# Patient Record
Sex: Female | Born: 1960 | Race: White | Hispanic: No | Marital: Married | Smoking: Former smoker
Health system: Southern US, Community
[De-identification: ages and names within clinical notes are randomized; demographics above are authoritative.]

## PROBLEM LIST (undated history)

## (undated) DIAGNOSIS — Z789 Other specified health status: Secondary | ICD-10-CM

## (undated) DIAGNOSIS — D649 Anemia, unspecified: Secondary | ICD-10-CM

## (undated) DIAGNOSIS — R0989 Other specified symptoms and signs involving the circulatory and respiratory systems: Secondary | ICD-10-CM

## (undated) DIAGNOSIS — Z0282 Encounter for adoption services: Secondary | ICD-10-CM

## (undated) DIAGNOSIS — C801 Malignant (primary) neoplasm, unspecified: Secondary | ICD-10-CM

## (undated) DIAGNOSIS — I6523 Occlusion and stenosis of bilateral carotid arteries: Secondary | ICD-10-CM

## (undated) DIAGNOSIS — E039 Hypothyroidism, unspecified: Secondary | ICD-10-CM

## (undated) DIAGNOSIS — G43909 Migraine, unspecified, not intractable, without status migrainosus: Secondary | ICD-10-CM

## (undated) DIAGNOSIS — N1330 Unspecified hydronephrosis: Secondary | ICD-10-CM

## (undated) DIAGNOSIS — M1990 Unspecified osteoarthritis, unspecified site: Secondary | ICD-10-CM

## (undated) DIAGNOSIS — N390 Urinary tract infection, site not specified: Secondary | ICD-10-CM

## (undated) DIAGNOSIS — D494 Neoplasm of unspecified behavior of bladder: Secondary | ICD-10-CM

## (undated) DIAGNOSIS — E538 Deficiency of other specified B group vitamins: Secondary | ICD-10-CM

## (undated) DIAGNOSIS — K824 Cholesterolosis of gallbladder: Secondary | ICD-10-CM

## (undated) DIAGNOSIS — F419 Anxiety disorder, unspecified: Secondary | ICD-10-CM

## (undated) DIAGNOSIS — Z720 Tobacco use: Secondary | ICD-10-CM

## (undated) DIAGNOSIS — R011 Cardiac murmur, unspecified: Secondary | ICD-10-CM

## (undated) DIAGNOSIS — F32A Depression, unspecified: Secondary | ICD-10-CM

## (undated) DIAGNOSIS — K219 Gastro-esophageal reflux disease without esophagitis: Secondary | ICD-10-CM

## (undated) DIAGNOSIS — J309 Allergic rhinitis, unspecified: Secondary | ICD-10-CM

## (undated) HISTORY — DX: Anemia, unspecified: D64.9

## (undated) HISTORY — PX: NASAL SEPTUM SURGERY: SHX37

## (undated) HISTORY — PX: FOOT SURGERY: SHX648

## (undated) HISTORY — DX: Allergic rhinitis, unspecified: J30.9

## (undated) HISTORY — DX: Anxiety disorder, unspecified: F41.9

## (undated) HISTORY — DX: Migraine, unspecified, not intractable, without status migrainosus: G43.909

## (undated) HISTORY — PX: BLADDER SURGERY: SHX569

## (undated) HISTORY — PX: ABDOMINAL HYSTERECTOMY: SHX81

## (undated) HISTORY — DX: Cholesterolosis of gallbladder: K82.4

## (undated) HISTORY — PX: CHOLECYSTECTOMY: SHX55

## (undated) HISTORY — PX: TONSILLECTOMY: SUR1361

## (undated) MED FILL — Fosaprepitant Dimeglumine For IV Infusion 150 MG (Base Eq): INTRAVENOUS | Qty: 5 | Status: AC

---

## 2005-05-16 ENCOUNTER — Ambulatory Visit: Payer: Self-pay | Admitting: Internal Medicine

## 2005-09-24 ENCOUNTER — Ambulatory Visit: Payer: Self-pay | Admitting: Internal Medicine

## 2006-06-19 ENCOUNTER — Ambulatory Visit: Payer: Self-pay | Admitting: Internal Medicine

## 2006-10-24 ENCOUNTER — Ambulatory Visit: Payer: Self-pay | Admitting: Unknown Physician Specialty

## 2007-07-22 ENCOUNTER — Ambulatory Visit: Payer: Self-pay | Admitting: Internal Medicine

## 2008-08-03 ENCOUNTER — Ambulatory Visit: Payer: Self-pay | Admitting: Internal Medicine

## 2009-08-04 ENCOUNTER — Ambulatory Visit: Payer: Self-pay | Admitting: Internal Medicine

## 2010-09-05 ENCOUNTER — Ambulatory Visit: Payer: Self-pay | Admitting: Internal Medicine

## 2011-09-18 ENCOUNTER — Ambulatory Visit: Payer: Self-pay | Admitting: Internal Medicine

## 2012-03-03 ENCOUNTER — Ambulatory Visit: Payer: Self-pay | Admitting: Otolaryngology

## 2012-03-03 HISTORY — PX: OTHER SURGICAL HISTORY: SHX169

## 2012-03-03 LAB — HEMOGLOBIN: HGB: 13.2 g/dL (ref 12.0–16.0)

## 2012-03-12 LAB — PATHOLOGY REPORT

## 2012-09-23 ENCOUNTER — Ambulatory Visit: Payer: Self-pay | Admitting: Internal Medicine

## 2012-10-22 HISTORY — PX: BREAST BIOPSY: SHX20

## 2012-12-05 ENCOUNTER — Observation Stay: Payer: Self-pay | Admitting: Surgery

## 2012-12-05 HISTORY — PX: LAPAROSCOPIC APPENDECTOMY: SHX408

## 2012-12-05 LAB — COMPREHENSIVE METABOLIC PANEL
Alkaline Phosphatase: 49 U/L — ABNORMAL LOW (ref 50–136)
Anion Gap: 7 (ref 7–16)
BUN: 21 mg/dL — ABNORMAL HIGH (ref 7–18)
Bilirubin,Total: 0.4 mg/dL (ref 0.2–1.0)
Calcium, Total: 8.5 mg/dL (ref 8.5–10.1)
Chloride: 111 mmol/L — ABNORMAL HIGH (ref 98–107)
Co2: 22 mmol/L (ref 21–32)
Creatinine: 0.87 mg/dL (ref 0.60–1.30)
EGFR (African American): >60
Osmolality: 281 (ref 275–301)
Potassium: 4 mmol/L (ref 3.5–5.1)
Sodium: 140 mmol/L (ref 136–145)
Total Protein: 7.6 g/dL (ref 6.4–8.2)

## 2012-12-05 LAB — CBC
MCH: 33.2 pg (ref 26.0–34.0)
MCHC: 34.4 g/dL (ref 32.0–36.0)
MCV: 97 fL (ref 80–100)
Platelet: 225 10*3/uL (ref 150–440)
RBC: 4.12 10*6/uL (ref 3.80–5.20)
WBC: 8.7 10*3/uL (ref 3.6–11.0)

## 2012-12-05 LAB — URINALYSIS, COMPLETE
Bilirubin,UR: NEGATIVE
Glucose,UR: NEGATIVE mg/dL (ref 0–75)
Ph: 6 (ref 4.5–8.0)
Protein: NEGATIVE
RBC,UR: 2 /HPF (ref 0–5)
Specific Gravity: 1.025 (ref 1.003–1.030)
WBC UR: 5 /HPF (ref 0–5)

## 2012-12-07 LAB — BASIC METABOLIC PANEL
Co2: 25 mmol/L (ref 21–32)
Creatinine: 0.89 mg/dL (ref 0.60–1.30)
EGFR (African American): >60
EGFR (Non-African Amer.): >60
Potassium: 3.3 mmol/L — ABNORMAL LOW (ref 3.5–5.1)

## 2012-12-07 LAB — CBC WITH DIFFERENTIAL/PLATELET
Basophil %: 0.7 %
Eosinophil #: 0.2 10*3/uL (ref 0.0–0.7)
HGB: 12.2 g/dL (ref 12.0–16.0)
MCH: 32.9 pg (ref 26.0–34.0)
MCV: 98 fL (ref 80–100)
Monocyte %: 7.9 %
Platelet: 217 10*3/uL (ref 150–440)
RBC: 3.72 10*6/uL — ABNORMAL LOW (ref 3.80–5.20)

## 2012-12-09 LAB — PATHOLOGY REPORT

## 2013-04-08 ENCOUNTER — Ambulatory Visit: Payer: Self-pay | Admitting: Physician Assistant

## 2013-09-25 ENCOUNTER — Ambulatory Visit: Payer: Self-pay | Admitting: Gastroenterology

## 2013-09-29 ENCOUNTER — Ambulatory Visit: Payer: Self-pay | Admitting: Internal Medicine

## 2013-10-09 ENCOUNTER — Ambulatory Visit: Payer: Self-pay | Admitting: Internal Medicine

## 2013-10-19 ENCOUNTER — Ambulatory Visit: Payer: Self-pay | Admitting: Internal Medicine

## 2013-10-20 LAB — PATHOLOGY REPORT

## 2015-02-11 NOTE — Op Note (Signed)
PATIENT NAME:  Barbara [name], Barbara [name] MR#:  [id number] DATE OF BIRTH:  10-Feb-1961  DATE OF PROCEDURE:  12/05/2012  PREOPERATIVE DIAGNOSIS: Acute appendicitis.   POSTOPERATIVE DIAGNOSIS: Acute appendicitis.   PROCEDURE: Laparoscopic appendectomy.   SURGEON: Kinslei Labine E. Excell Seltzerooper, MD.   ANESTHESIA: General with endotracheal tube.   INDICATIONS: This is a patient with right lower quadrant pain which is progressive. She has a normal white blood cell count, but has physical findings and CT findings suggestive of appendicitis and requires laparoscopy.   Preoperatively, we discussed the rationale for surgery, the options of observation, the risks of bleeding, infection, negative laparoscopy and an open procedure. This was reviewed for her. She understood and agreed to proceed.   FINDINGS: Acute appendicitis, nonruptured.   DESCRIPTION OF PROCEDURE: The patient was induced with general anesthesia, given IV antibiotics, VTE prophylaxis was in place. She was prepped and draped in a sterile fashion. Marcaine was infiltrated in skin and subcutaneous tissues around the periumbilical area. An incision was made. The Veress needle was placed. Pneumoperitoneum was obtained. A 5-mm trocar port was placed. The abdominal cavity was explored, and under direct vision, a 12-mm left lateral port was placed followed by a 5-mm suprapubic port, and the appendix was identified in the right lower quadrant, elevated. The base of the appendix appeared inflamed. The base of the appendix was divided utilizing an EndoGIA standard type load, and then the mesoappendix was divided utilizing 2 firings of the vascular load EndoGIA, and the specimen was passed out through the lateral port site with the aid of an EndoCatch bag. The area was irrigated with copious amounts of normal saline. Hemostasis was adequate. There was no sign of bowel injury or bleeding. The left lateral port site was closed with multiple simple sutures utilizing an EndoClose  technique of 0 Vicryl under direct vision, and then pneumoperitoneum was released. All ports were removed, 4-0 subcuticular moderate Monocryl was used on all skin edges. Steri-Strips, Mastisol and sterile dressings were placed.   The patient tolerated the procedure well. There were no complications. She was taken to the Recovery Room in stable condition to be admitted for continued care.   ____________________________ Adah Salvageichard E. Excell Seltzerooper, MD rec:jm D: 12/05/2012 21:47:58 ET T: 12/06/2012 10:56:22 ET JOB#: [id number]  cc: Adah Salvageichard E. Excell Seltzerooper, MD, <Dictator> Lattie HawICHARD E Arisha Gervais MD ELECTRONICALLY SIGNED 12/07/2012 19:37

## 2015-02-11 NOTE — H&P (Signed)
Subjective/Chief Complaint rlq pain 18 hrs rlq pain, nausea no emesis, nml BM this am. no prior episode n o f/c   History of Present Illness see above no back pain   Past History PMH depression, migraines PSH septum, thyroglossal duct cyst. foot fx "partial hysterectomy"   Past Med/Surgical Hx:  Arthritis:   Anemia:   Migraines:   Pubovaginal Sling/Cystocele A/P repair:   Right Foot Surgery:   Tonsillectomy:   hysterectomy:   deviated septum:   ALLERGIES:  NKA: None  Family and Social History:  Family History Non-Contributory   Social History positive  tobacco, negative ETOH, office   + Tobacco Current (within 1 year)   Place of Living Home   Review of Systems:  Fever/Chills No   Cough No   Abdominal Pain Yes   Diarrhea No   Constipation No   Nausea/Vomiting Yes   SOB/DOE No   Chest Pain No   Dysuria No   Tolerating Diet No  Nauseated   Medications/Allergies Reviewed Medications/Allergies reviewed   Physical Exam:  GEN uncomfortable   HEENT pink conjunctivae   NECK supple   RESP normal resp effort  clear BS  no use of accessory muscles   CARD regular rate   ABD positive tenderness  denies Flank Tenderness  soft  rlq max with pos Rosving's sign   LYMPH negative neck   EXTR negative edema   SKIN normal to palpation   PSYCH alert, A+O to time, place, person, good insight   Lab Results: Hepatic:  14-Feb-14 14:48   Bilirubin, Total 0.4  Alkaline Phosphatase  49  SGPT (ALT) 18  SGOT (AST) 18  Total Protein, Serum 7.6  Albumin, Serum 4.1  Routine Chem:  14-Feb-14 14:48   Glucose, Serum 74  BUN  21  Creatinine (comp) 0.87  Sodium, Serum 140  Potassium, Serum 4.0  Chloride, Serum  111  CO2, Serum 22  Calcium (Total), Serum 8.5  Osmolality (calc) 281  eGFR (African American) >60  eGFR (Non-African American) >60 (eGFR values <67m/min/1.73 m2 may be an indication of chronic kidney disease (CKD). Calculated eGFR is useful in  patients with stable renal function. The eGFR calculation will not be reliable in acutely ill patients when serum creatinine is changing rapidly. It is not useful in  patients on dialysis. The eGFR calculation may not be applicable to patients at the low and high extremes of body sizes, pregnant women, and vegetarians.)  Anion Gap 7  Routine UA:  14-Feb-14 14:48   Color (UA) Yellow  Clarity (UA) Clear  Glucose (UA) Negative  Bilirubin (UA) Negative  Ketones (UA) 1+  Specific Gravity (UA) 1.025  Blood (UA) 2+  pH (UA) 6.0  Protein (UA) Negative  Nitrite (UA) Negative  Leukocyte Esterase (UA) Negative (Result(s) reported on 05 Dec 2012 at 04:04PM.)  RBC (UA) 2 /HPF  WBC (UA) 5 /HPF  Bacteria (UA) TRACE  Epithelial Cells (UA) 5 /HPF  Mucous (UA) PRESENT (Result(s) reported on 05 Dec 2012 at 04:04PM.)  Routine Hem:  14-Feb-14 14:48   WBC (CBC) 8.7  RBC (CBC) 4.12  Hemoglobin (CBC) 13.7  Hematocrit (CBC) 39.8  Platelet Count (CBC) 225 (Result(s) reported on 05 Dec 2012 at 03:14PM.)  MCV 97  MCH 33.2  MCHC 34.4  RDW 13.0    Assessment/Admission Diagnosis rlq pain, ac appendicitits risks options in detail  see dictation   Electronic Signatures: CFlorene Glen(MD)  (Signed 14-Feb-14 20:08)  Authored: CHIEF COMPLAINT and HISTORY, PAST  MEDICAL/SURGIAL HISTORY, ALLERGIES, FAMILY AND SOCIAL HISTORY, REVIEW OF SYSTEMS, PHYSICAL EXAM, LABS, ASSESSMENT AND PLAN   Last Updated: 14-Feb-14 20:08 by Florene Glen (MD)

## 2015-02-11 NOTE — H&P (Signed)
PATIENT NAME:  Barbara [name], Leta L MR#:  [id number] DATE OF BIRTH:  09/08/1961  DATE OF ADMISSION:  12/05/2012  CHIEF COMPLAINT:  Right lower quadrant pain.   HISTORY OF PRESENT ILLNESS:  This is a patient with 12 to 18 hours of abdominal pain, started in the right lower quadrant.  She has never had an episode like this before.  She has had nausea, but no emesis.  Denies back pain.  No fevers or chills.  Had a normal bowel movement this morning, none since.  No diarrhea.  No melena or hematochezia.  No dysuria.   PAST MEDICAL HISTORY:  Depression and migraines.  PAST SURGICAL HISTORY:  Tonsillectomy, adenoidectomy, thyroglossal duct cyst excision, deviated septum, partial hysterectomy.  She believes her ovaries are in place.  She had a foot fracture requiring pins.   ALLERGIES:  None.   MEDICATIONS:  Multiple.  See chart.   FAMILY HISTORY:  Noncontributory.   SOCIAL HISTORY:  The patient smokes tobacco.  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   REVIEW OF SYSTEMS:   A 10 system review is performed and negative with the exception of that mentioned in the history of present illness.   PHYSICAL EXAMINATION: GENERAL:  Healthy, uncomfortable-appearing Caucasian female patient.  VITAL SIGNS:  Temp of 97.9, pulse of 78, respirations 20, blood pressure 120/58, pain scale of 7, 99% room air sat.  HEENT:  Shows no scleral icterus.  No palpable neck nodes.  CHEST:  Clear to auscultation.  CARDIAC:  Regular rate and rhythm.  ABDOMEN:  Soft.  Tender at McBurney's point maximally.  There is a positive Rovsing's sign.  Minimal percussion tenderness.  Minimal guarding in the right lower quadrant with no CVA tenderness.  EXTREMITIES:  Without edema.  NEUROLOGIC:  Grossly intact.  INTEGUMENT:  Shows no jaundice.   LABORATORY, DIAGNOSTIC AND RADIOLOGICAL DATA:  Laboratory values demonstrate a normal white blood cell count at 8.7.  H and H of 13.7 and 40.  Electrolytes are within normal limits  with the exception of chloride of 111.    CT scan is personally reviewed, suggestive of appendicitis.   ASSESSMENT AND PLAN:  This is a patient with likely appendicitis.  Her white blood cell count is normal, but she is maximally tender at McBurney's point with a positive Rovsing's sign and CT findings suggestive of appendicitis.  The rationale for offering laparoscopy has been discussed.  The risks of bleeding, infection, negative laparoscopy, conversion to an open procedure were reviewed with her.  She understood and agreed to proceed.    ____________________________ Adah Salvageichard E. Excell Seltzerooper, MD rec:ea D: 12/05/2012 20:11:00 ET T: 12/06/2012 02:00:00 ET JOB#: [id number]  cc: Adah Salvageichard E. Excell Seltzerooper, MD, <Dictator> Lattie HawICHARD E Jalayia Bagheri MD ELECTRONICALLY SIGNED 12/06/2012 6:37

## 2015-02-11 NOTE — Discharge Summary (Signed)
PATIENT NAME:  Barbara [name], Barbara [name] MR#:  [id number] DATE OF BIRTH:  01-Sep-1961  DATE OF ADMISSION:  12/05/2012 DATE OF DISCHARGE:  12/07/2012  DIAGNOSES:  1.  Depression. 2.  Acute appendicitis.   PROCEDURE: Laparoscopic appendectomy.   HISTORY OF PRESENT ILLNESS AND HOSPITAL COURSE:  This is a patient who has presented to the Emergency Room with right lower quadrant abdominal pain and nausea. Work-up showed normal white blood cell count but abnormal CT scan and physical findings suggestive of appendicitis.   She was taken to the operating room where laparoscopic appendectomy was performed for confirmed acute appendicitis. She made an uncomplicated postoperative recovery, did have some nausea and pain control issues and this precluded her from going home in the first 23 hours.  She stayed an extra evening and then went home the next day on oral analgesics with instructions to restart all of her home medications and showering and dressing instructions were given. She will follow up in my office in 10 days.    ____________________________ Adah Salvageichard E. Excell Seltzerooper, MD rec:ct D: 12/07/2012 19:48:34 ET T: 12/08/2012 12:10:20 ET JOB#: [id number]  cc: Adah Salvageichard E. Excell Seltzerooper, MD, <Dictator> Lattie HawICHARD E COOPER MD ELECTRONICALLY SIGNED 12/15/2012 9:42

## 2015-02-13 NOTE — Op Note (Signed)
PATIENT NAME:  Barbara [name], Barbara [name] MR#:  [id number] DATE OF BIRTH:  Mar 14, 1961  DATE OF PROCEDURE:  03/03/2012  PREOPERATIVE DIAGNOSIS: Thyroglossal duct cyst.   POSTOPERATIVE DIAGNOSIS: Thyroglossal duct cyst.  PROCEDURES: Excision of thyroglossal duct cyst with Sistrunk procedure and removal of midline hyoid bone.   SURGEON: Vernie MurdersPaul Mateen Franssen, MD  ASSISTANT: Karlyne GreenspanWilliam Vaught, MD  ANESTHESIA: General.   COMPLICATIONS: None.   TOTAL ESTIMATED BLOOD LOSS: Minimal.   DESCRIPTION OF PROCEDURE: The patient was given general anesthesia by oral endotracheal intubation. She was placed in the spine position with a shoulder roll and neck extended slightly. A large midline lump could be seen overlying the thyroid notch. A skin crease was marked in the midline and then 1% Xylocaine with epinephrine 1:100,000 was used for infiltration of the skin edges; 4 mL total were used. She was prepped and draped in sterile fashion.   An incision was made through the skin and sub-Q. There was no significant platysma in the midline. Dural hooks were used to help open this up. The strap muscles were overlying this and these were divided in the midline. They were separated from the top of this on both sides. The cyst was relatively long and came down over the thyroid notch. There was no significant pyramidal lobe that I could see and inferiorly the attachments were freed up. The muscle was freed up laterally on both sides and then the underlying soft tissue were freed up from the thyroid cartilage first and then the thyrohyoid membrane. The gland was attached to the hyoid bone. It was freed up on both sides all the way up to the bone and then using electrocautery the muscle attachments, at the superior edge of the hyoid bone, were freed up. Bone was skeletonized on both sides as well and then rongeurs were used to cut the bone on each side. The remaining soft tissue attachments to the preepiglottic space were freed up. The soft  tissue at the base of the tongue was searched for a tract based, at the base of the tongue. No further tracks or connections were noted. The area was separated free and the specimen was sent for permanent section. The wound was evaluated and a couple of small areas in the preepiglottic space were cauterized. No further bleeding was noted. Total blood loss was minimal. The wound was irrigated copiously and again no further bleeding. A 7 mm TLS drain was placed through an inferior stab incision and placed to low continuous bulb suction. The strap muscles were repaired first. The deep tissue was then sutured together loosely as there was no platysmal layer here. The dermis was then closed with interrupted 4-0 Vicryls and then the skin edges were held in apposition with a 6-0 running nylon locking suture. The wound was covered with bacitracin followed by Telfa and a Tegaderm. The drain was placed to low continuous Vacutainer suction. The patient tolerated the procedure well. She was awakened and taken to the recovery room in satisfactory condition. There were no operative complications.  ____________________________ Cammy CopaPaul H. Aasir Daigler, MD phj:slb D: 03/03/2012 08:52:51 ET T: 03/03/2012 13:01:44 ET JOB#: [id number]  cc: Cammy CopaPaul H. Demareon Coldwell, MD, <Dictator> Cammy CopaPAUL H Joseline Mccampbell MD ELECTRONICALLY SIGNED 03/03/2012 18:27

## 2015-09-19 ENCOUNTER — Other Ambulatory Visit: Payer: Self-pay | Admitting: Internal Medicine

## 2015-09-19 DIAGNOSIS — Z1231 Encounter for screening mammogram for malignant neoplasm of breast: Secondary | ICD-10-CM

## 2015-10-04 ENCOUNTER — Ambulatory Visit: Payer: Self-pay | Attending: Internal Medicine

## 2015-10-07 ENCOUNTER — Ambulatory Visit
Admission: RE | Admit: 2015-10-07 | Discharge: 2015-10-07 | Disposition: A | Discharge status: Home / Self Care | Payer: 59 | Source: Ambulatory Visit | Attending: Internal Medicine | Admitting: Internal Medicine

## 2015-10-07 DIAGNOSIS — Z1231 Encounter for screening mammogram for malignant neoplasm of breast: Secondary | ICD-10-CM | POA: Diagnosis not present

## 2015-10-18 ENCOUNTER — Other Ambulatory Visit: Payer: Self-pay | Admitting: Internal Medicine

## 2015-10-18 DIAGNOSIS — R928 Other abnormal and inconclusive findings on diagnostic imaging of breast: Secondary | ICD-10-CM

## 2015-10-27 ENCOUNTER — Ambulatory Visit
Admission: RE | Admit: 2015-10-27 | Discharge: 2015-10-27 | Disposition: A | Discharge status: Home / Self Care | Payer: 59 | Source: Ambulatory Visit | Attending: Internal Medicine | Admitting: Internal Medicine

## 2015-10-27 DIAGNOSIS — R928 Other abnormal and inconclusive findings on diagnostic imaging of breast: Secondary | ICD-10-CM | POA: Insufficient documentation

## 2016-09-20 ENCOUNTER — Other Ambulatory Visit: Payer: Self-pay | Admitting: Nurse Practitioner

## 2016-09-20 DIAGNOSIS — Z1231 Encounter for screening mammogram for malignant neoplasm of breast: Secondary | ICD-10-CM

## 2016-10-26 ENCOUNTER — Ambulatory Visit: Payer: 59

## 2016-10-29 ENCOUNTER — Ambulatory Visit
Admission: RE | Admit: 2016-10-29 | Discharge: 2016-10-29 | Disposition: A | Discharge status: Home / Self Care | Payer: 59 | Source: Ambulatory Visit | Attending: Nurse Practitioner | Admitting: Nurse Practitioner

## 2016-10-29 DIAGNOSIS — Z1231 Encounter for screening mammogram for malignant neoplasm of breast: Secondary | ICD-10-CM | POA: Insufficient documentation

## 2017-09-24 ENCOUNTER — Other Ambulatory Visit: Payer: Self-pay | Admitting: Nurse Practitioner

## 2017-09-24 DIAGNOSIS — Z1231 Encounter for screening mammogram for malignant neoplasm of breast: Secondary | ICD-10-CM

## 2017-10-31 ENCOUNTER — Ambulatory Visit
Admission: RE | Admit: 2017-10-31 | Discharge: 2017-10-31 | Disposition: A | Discharge status: Home / Self Care | Payer: Managed Care, Other (non HMO) | Source: Ambulatory Visit | Attending: Nurse Practitioner | Admitting: Nurse Practitioner

## 2017-10-31 DIAGNOSIS — Z1231 Encounter for screening mammogram for malignant neoplasm of breast: Secondary | ICD-10-CM | POA: Diagnosis not present

## 2018-09-11 ENCOUNTER — Other Ambulatory Visit: Payer: Self-pay

## 2018-09-11 MED ORDER — ESCITALOPRAM OXALATE 20 MG PO TABS: ORAL_TABLET | ORAL | 0 refills | Status: DC

## 2018-09-11 MED ORDER — ESTRADIOL 1 MG PO TABS: 1.0000 mg | ORAL_TABLET | Freq: Every day | ORAL | 0 refills | Status: DC

## 2018-09-11 MED ORDER — TOPIRAMATE 50 MG PO TABS: 50.0000 mg | ORAL_TABLET | Freq: Two times a day (BID) | ORAL | 0 refills | Status: DC | PRN

## 2018-09-11 MED ORDER — CELECOXIB 200 MG PO CAPS: 200.0000 mg | ORAL_CAPSULE | Freq: Every day | ORAL | 0 refills | Status: DC

## 2018-09-23 ENCOUNTER — Encounter: Payer: Self-pay | Admitting: Nurse Practitioner

## 2018-09-25 ENCOUNTER — Ambulatory Visit (INDEPENDENT_AMBULATORY_CARE_PROVIDER_SITE_OTHER): Payer: Managed Care, Other (non HMO) | Admitting: Nurse Practitioner

## 2018-09-25 ENCOUNTER — Ambulatory Visit
Admission: RE | Admit: 2018-09-25 | Discharge: 2018-09-25 | Disposition: A | Discharge status: Home / Self Care | Payer: Managed Care, Other (non HMO) | Source: Ambulatory Visit | Attending: Nurse Practitioner | Admitting: Nurse Practitioner

## 2018-09-25 ENCOUNTER — Encounter: Payer: Self-pay | Admitting: Nurse Practitioner

## 2018-09-25 VITALS — BP 116/68 | HR 75 | Resp 16 | Ht 65.0 in | Wt 124.6 lb

## 2018-09-25 DIAGNOSIS — G43509 Persistent migraine aura without cerebral infarction, not intractable, without status migrainosus: Secondary | ICD-10-CM | POA: Diagnosis not present

## 2018-09-25 DIAGNOSIS — F17210 Nicotine dependence, cigarettes, uncomplicated: Secondary | ICD-10-CM

## 2018-09-25 DIAGNOSIS — R059 Cough, unspecified: Secondary | ICD-10-CM | POA: Insufficient documentation

## 2018-09-25 DIAGNOSIS — Z124 Encounter for screening for malignant neoplasm of cervix: Secondary | ICD-10-CM

## 2018-09-25 DIAGNOSIS — M62838 Other muscle spasm: Secondary | ICD-10-CM | POA: Diagnosis not present

## 2018-09-25 DIAGNOSIS — R0989 Other specified symptoms and signs involving the circulatory and respiratory systems: Secondary | ICD-10-CM

## 2018-09-25 DIAGNOSIS — M150 Primary generalized (osteo)arthritis: Secondary | ICD-10-CM

## 2018-09-25 DIAGNOSIS — R5383 Other fatigue: Secondary | ICD-10-CM | POA: Insufficient documentation

## 2018-09-25 DIAGNOSIS — R05 Cough: Secondary | ICD-10-CM | POA: Insufficient documentation

## 2018-09-25 DIAGNOSIS — Z1239 Encounter for other screening for malignant neoplasm of breast: Secondary | ICD-10-CM

## 2018-09-25 DIAGNOSIS — Z0001 Encounter for general adult medical examination with abnormal findings: Secondary | ICD-10-CM | POA: Diagnosis not present

## 2018-09-25 DIAGNOSIS — E2839 Other primary ovarian failure: Secondary | ICD-10-CM

## 2018-09-25 DIAGNOSIS — F324 Major depressive disorder, single episode, in partial remission: Secondary | ICD-10-CM

## 2018-09-25 MED ORDER — CARISOPRODOL 350 MG PO TABS: 350.0000 mg | ORAL_TABLET | Freq: Every day | ORAL | 1 refills | Status: DC

## 2018-09-25 MED ORDER — CELECOXIB 200 MG PO CAPS: 200.0000 mg | ORAL_CAPSULE | Freq: Every day | ORAL | 3 refills | Status: DC

## 2018-09-25 MED ORDER — TOPIRAMATE 50 MG PO TABS: 50.0000 mg | ORAL_TABLET | Freq: Every day | ORAL | 3 refills | Status: DC

## 2018-09-25 MED ORDER — ESCITALOPRAM OXALATE 20 MG PO TABS: ORAL_TABLET | ORAL | 3 refills | Status: DC

## 2018-09-25 MED ORDER — ESTRADIOL 1 MG PO TABS: 1.0000 mg | ORAL_TABLET | Freq: Every day | ORAL | 3 refills | Status: DC

## 2018-09-25 NOTE — Addendum Note (Signed)
Addended by: Golda AcrePATEL, Nohelani Benning on: 09/25/2018 02:49 PM   Modules accepted: Orders

## 2018-09-25 NOTE — Progress Notes (Signed)
[facility] [address] [place], [place] [address]  Internal MEDICINE  Office Visit Note  Patient Name: Barbara [name]  [id number]  [id number]  Date of Service: 09/25/2018   Pt is here for routine health maintenance examination   Chief Complaint  Patient presents with  . Annual Exam  . Anxiety  . Migraine     The patient is here for routine health maintenance exam and pap smear today. She is due to have routine, fasting labs checked and will be due to screening mammogram in January, 2020. She is a smoker. States that she smokes about 1/2 pack of cigarettes per day. Her ex-husband recently passed away from lung cancer, and she would like to be screened. She does have a chronic, intermittent cough. She denies chest pain, pressure, or shortness of breath.    Current Medication: Outpatient Encounter Medications as of 09/25/2018  Medication Sig  . carisoprodol (SOMA) 350 MG tablet Take 1 tablet (350 mg total) by mouth at bedtime.  . celecoxib (CELEBREX) 200 MG capsule Take 1 capsule (200 mg total) by mouth daily.  Marland Kitchen escitalopram (LEXAPRO) 20 MG tablet Take 1/2 tab po once a day and increase to 1 tab daily as directed  . estradiol (ESTRACE) 1 MG tablet Take 1 tablet (1 mg total) by mouth daily.  Marland Kitchen topiramate (TOPAMAX) 50 MG tablet Take 1 tablet (50 mg total) by mouth daily.  . [DISCONTINUED] carisoprodol (SOMA) 350 MG tablet Take 350 mg by mouth at bedtime.  . [DISCONTINUED] celecoxib (CELEBREX) 200 MG capsule Take 1 capsule (200 mg total) by mouth daily.  . [DISCONTINUED] escitalopram (LEXAPRO) 20 MG tablet Take 1/2 tab po once a day and increase to 1 tab daily as directed  . [DISCONTINUED] estradiol (ESTRACE) 1 MG tablet Take 1 tablet (1 mg total) by mouth daily.  . [DISCONTINUED] topiramate (TOPAMAX) 50 MG tablet Take 1 tablet (50 mg total) by mouth 2 (two) times daily as needed.   No facility-administered encounter medications on file as of 09/25/2018.      Surgical History: Past Surgical History:  Procedure Laterality Date  . ABDOMINAL HYSTERECTOMY    . BREAST BIOPSY Left 2014   neg  . CHOLECYSTECTOMY    . cyst removed from neck    . FOOT SURGERY Right    2009  . NASAL SEPTUM SURGERY      Medical History: Past Medical History:  Diagnosis Date  . Allergic rhinitis   . Anemia   . Anxiety   . Migraines     Family History: Family History  Adopted: Yes      Review of Systems  Constitutional: Negative for activity change, chills, fatigue and unexpected weight change.  HENT: Negative for congestion, postnasal drip, rhinorrhea, sneezing and sore throat.   Respiratory: Positive for cough. Negative for chest tightness and shortness of breath.   Cardiovascular: Negative for chest pain and palpitations.  Gastrointestinal: Negative for abdominal pain, constipation, diarrhea, nausea and vomiting.  Endocrine: Negative for cold intolerance, heat intolerance, polydipsia and polyuria.  Genitourinary: Negative for dysuria and frequency.  Musculoskeletal: Positive for arthralgias and myalgias. Negative for back pain, joint swelling and neck pain.  Skin: Negative for rash.  Allergic/Immunologic: Negative for environmental allergies.  Neurological: Positive for headaches. Negative for tremors and numbness.  Hematological: Negative for adenopathy. Does not bruise/bleed easily.  Psychiatric/Behavioral: Negative for behavioral problems (Depression), sleep disturbance and suicidal ideas. The patient is not nervous/anxious.      Today's Vitals  09/25/18 1024  BP: 116/68  Pulse: 75  Resp: 16  SpO2: 96%  Weight: 124 lb 9.6 oz (56.5 kg)  Height: 5\' 5"  (1.651 m)    Physical Exam  Constitutional: She is oriented to person, place, and time. She appears well-developed and well-nourished. No distress.  HENT:  Head: Normocephalic and atraumatic.  Nose: Nose normal.  Mouth/Throat: Oropharynx is clear and moist. No oropharyngeal exudate.   Eyes: Pupils are equal, round, and reactive to light. Conjunctivae and EOM are normal.  Neck: Normal range of motion. Neck supple. No JVD present. Carotid bruit is present. No tracheal deviation present. No thyromegaly present.  Soft, bilateral carotid bruit.  Cardiovascular: Normal rate, regular rhythm, normal heart sounds and intact distal pulses. Exam reveals no gallop and no friction rub.  No murmur heard. Pulmonary/Chest: Effort normal and breath sounds normal. No respiratory distress. She has no wheezes. She has no rales. She exhibits no tenderness. Right breast exhibits no inverted nipple, no mass, no nipple discharge, no skin change and no tenderness. Left breast exhibits no inverted nipple, no mass, no nipple discharge, no skin change and no tenderness.  Abdominal: Soft. Bowel sounds are normal. There is no tenderness.  Genitourinary: Vagina normal and uterus normal.  Genitourinary Comments: No tenderness, masses, or organomeglay present during bimanual exam .  Musculoskeletal: Normal range of motion.  Lymphadenopathy:    She has no cervical adenopathy.  Neurological: She is alert and oriented to person, place, and time. No cranial nerve deficit.  Skin: Skin is warm and dry. Capillary refill takes less than 2 seconds. She is not diaphoretic.  Psychiatric: She has a normal mood and affect. Her behavior is normal. Judgment and thought content normal.  Nursing note and vitals reviewed.  Assessment/Plan: 1. Encounter for general adult medical examination with abnormal findings Annual health maintenance exam and pap smear today.  - UA/M w/rflx Culture, Routine  2. Persistent migraine aura without cerebral infarction and without status migrainosus, not intractable Generally well managed. Continue topamax 50mg  daily to prevent acute headaches . - topiramate (TOPAMAX) 50 MG tablet; Take 1 tablet (50 mg total) by mouth daily.  Dispense: 90 tablet; Refill: 3  3. Routine cervical smear -  Pap IG and HPV (high risk) DNA detection  4. Muscle spasms of neck May continue to take soma 350mg  at bedtime as needed for neck pain/muscle spasms  - carisoprodol (SOMA) 350 MG tablet; Take 1 tablet (350 mg total) by mouth at bedtime.  Dispense: 90 tablet; Refill: 1  5. Cough Long term history of smoking. Will get chest x-ray for further evaluation.  - DG Chest 2 View; Future  6. Cigarette nicotine dependence without complication Long term history of smoking. Will get chest x-ray for further evaluation.  Discussion regarding importance of smoking cessation. Patient plans to quit completely by the new year.  - DG Chest 2 View; Future  7. Bilateral carotid bruits - US Carotid Duplex Bilateral; Future  8. Primary generalized hypertrophic osteoarthrosis - celecoxib (CELEBREX) 200 MG capsule; Take 1 capsule (200 mg total) by mouth daily.  Dispense: 90 capsule; Refill: 3  9. Depression, major, single episode, in partial remission (HCC) - escitalopram (LEXAPRO) 20 MG tablet; Take 1/2 tab po once a day and increase to 1 tab daily as directed  Dispense: 90 tablet; Refill: 3  10. Estrogen deficiency - estradiol (ESTRACE) 1 MG tablet; Take 1 tablet (1 mg total) by mouth daily.  Dispense: 90 tablet; Refill: 3  11. Screening for breast  cancer - MM DIGITAL SCREENING BILATERAL; Future  General Counseling: Debi verbalizes understanding of the findings of todays visit and agrees with plan of treatment. I have discussed any further diagnostic evaluation that may be needed or ordered today. We also reviewed her medications today. she has been encouraged to call the office with any questions or concerns that should arise related to todays visit.    Counseling:  Smoking cessation counseling: 1. Pt acknowledges the risks of long term smoking, she will try to quite smoking. 2. Options for different medications including nicotine products, chewing gum, patch etc, Wellbutrin and Chantix is  discussed 3. Goal and date of compete cessation is discussed 4. Total time spent in smoking cessation is 15 min.  This patient was seen by Vincent GrosHeather Jameica Couts FNP Collaboration with Dr Lyndon CodeFozia M Khan as a part of collaborative care agreement   Orders Placed This Encounter  Procedures  . MM DIGITAL SCREENING BILATERAL  . DG Chest 2 View  . US Carotid Duplex Bilateral  . UA/M w/rflx Culture, Routine    Meds ordered this encounter  Medications  . topiramate (TOPAMAX) 50 MG tablet    Sig: Take 1 tablet (50 mg total) by mouth daily.    Dispense:  90 tablet    Refill:  3    Order Specific Question:   Supervising Provider    Answer:   Lyndon CodeKHAN, FOZIA M [[id number]]  . estradiol (ESTRACE) 1 MG tablet    Sig: Take 1 tablet (1 mg total) by mouth daily.    Dispense:  90 tablet    Refill:  3    Order Specific Question:   Supervising Provider    Answer:   Lyndon CodeKHAN, FOZIA M [[id number]]  . escitalopram (LEXAPRO) 20 MG tablet    Sig: Take 1/2 tab po once a day and increase to 1 tab daily as directed    Dispense:  90 tablet    Refill:  3    Order Specific Question:   Supervising Provider    Answer:   Lyndon CodeKHAN, FOZIA M [[id number]]  . celecoxib (CELEBREX) 200 MG capsule    Sig: Take 1 capsule (200 mg total) by mouth daily.    Dispense:  90 capsule    Refill:  3    Order Specific Question:   Supervising Provider    Answer:   Lyndon CodeKHAN, FOZIA M [[id number]]  . carisoprodol (SOMA) 350 MG tablet    Sig: Take 1 tablet (350 mg total) by mouth at bedtime.    Dispense:  90 tablet    Refill:  1    Order Specific Question:   Supervising Provider    Answer:   Lyndon CodeKHAN, FOZIA M [[id number]]    Time spent: 7630 Minutes      Lyndon CodeFozia M Khan, MD  Internal Medicine

## 2018-09-26 ENCOUNTER — Telehealth: Payer: Self-pay

## 2018-09-26 LAB — UA/M W/RFLX CULTURE, ROUTINE
Bilirubin, UA: NEGATIVE
Glucose, UA: NEGATIVE
Ketones, UA: NEGATIVE
LEUKOCYTES UA: NEGATIVE
Nitrite, UA: NEGATIVE
Protein, UA: NEGATIVE
RBC, UA: NEGATIVE
Specific Gravity, UA: 1.021 (ref 1.005–1.030)
Urobilinogen, Ur: 1 mg/dL (ref 0.2–1.0)
pH, UA: 5.5 (ref 5.0–7.5)

## 2018-09-26 LAB — MICROSCOPIC EXAMINATION
Casts: NONE SEEN /lpf
Epithelial Cells (non renal): >10 /hpf — AB (ref 0–10)

## 2018-09-26 NOTE — Telephone Encounter (Signed)
-----   Message from Carlean JewsHeather E Boscia, NP sent at 09/26/2018  8:07 AM EST ----- Please let the patient know that her chest x-ray is normal. Thanks.

## 2018-09-26 NOTE — Telephone Encounter (Signed)
Left message for pt to call back for chest xray results

## 2018-09-26 NOTE — Telephone Encounter (Signed)
Pt advised chest xray came back normal

## 2018-09-28 LAB — PAP IG AND HPV HIGH-RISK: HPV, HIGH-RISK: NEGATIVE

## 2018-10-17 ENCOUNTER — Ambulatory Visit: Payer: Managed Care, Other (non HMO)

## 2018-10-17 DIAGNOSIS — R0989 Other specified symptoms and signs involving the circulatory and respiratory systems: Secondary | ICD-10-CM | POA: Diagnosis not present

## 2018-10-23 ENCOUNTER — Telehealth: Payer: Self-pay | Admitting: Nurse Practitioner

## 2018-10-23 NOTE — Telephone Encounter (Signed)
I don't have any ultrasound results from 10/17/2018 yet. Can you ask Barbara [name] if she has them and she can put it on my desk or send me pic of it tomorrow.

## 2018-10-27 NOTE — Procedures (Signed)
[facility] [address] [place], [place] [address]  DATE OF SERVICE: 10/17/2018  CAROTID DOPPLER INTERPRETATION:  Bilateral Carotid Ultrsasound and Color Doppler Examination was performed. The RIGHT CCA shows mild plaque in the vessel. The LEFT CCA shows mild plaque in the vessel. There was nno intimal thickening noted in the RIGHT carotid artery. There was no intimal thickening in the LEFT carotid artery.  The RIGHT CCA shows peak systolic velocity of 84cm per second. The end diastolic velocity is 21cm per second on the RIGHT side. The RIGHT ICA shows peak systolic velocity of 75 per second. RIGHT sided ICA end diastolic velocity is 30cm per second. The RIGHT ECA shows a peak systolic velocity of 76cm per second. The ICA/CCA ratio is calculated to be 0.9. This suggests less than 50% stenosis. The Vertebral Artery shows antegrae flow.  The LEFT CCA shows peak systolic velocity of 69cm per second. The end diastolic velocity is 20cm per second on the LEFT side. The LEFT ICA shows peak systolic velocity of 105per second. LEFT sided ICA end diastolic velocity is 39cm per second. The LEFT ECA shows a peak systolic velocity of 78cm per second. The ICA/CCA ratio is calculated to be 1.5. This suggests less than 50% stenosis. The Vertebral Artery shows antegrade flow.   Impression:    The RIGHT CAROTID shows less than 50% stenosis. The LEFT CAROTID shows less than 50% stenosis.  There is mild plaque formation noted on the LEFT and mild on the RIGHT  side. Consider a repeat Carotid doppler if clinical situation and symptoms warrant in 6-12 months. Patient should be encouraged to change lifestyles such as smoking cessation, regular exercise and dietary modification. Use of statins in the right clinical setting and ASA is encouraged.  Yevonne Pax, MD [facility]

## 2018-10-28 ENCOUNTER — Telehealth: Payer: Self-pay

## 2018-10-28 NOTE — Telephone Encounter (Signed)
PT CALLED ASKING WHAT PERCENTAGE OF PLAQUE WAS SHOWN IN HER ULTRASOUND AND WHAT SHE CAN DO TO PREVENT OF RESOLVE IT.   SPOKE WITH HEATHER AND SHE SAID PT HAS MILD PLAQUE, LESS THAN 50%,  STENOSIS (HARDENING), OF CAROTID ARTERIES. WE WILL MONITOR THIS YEARLY. WE CAN MAIL HER A HEART HEALTH DIET AND SHE SHOULD PARTICIPATE IN LOW IMPACT EXERCISE ROUTINELY.   I CALLED PT BACK AND NOTIFIED HER OF THE ABOVE AND PUT THE DIET IN THE MAIL TO BE SENT TO HER.

## 2018-11-04 ENCOUNTER — Ambulatory Visit
Admission: RE | Admit: 2018-11-04 | Discharge: 2018-11-04 | Disposition: A | Discharge status: Home / Self Care | Payer: Managed Care, Other (non HMO) | Source: Ambulatory Visit | Attending: Nurse Practitioner | Admitting: Nurse Practitioner

## 2018-11-04 DIAGNOSIS — Z1239 Encounter for other screening for malignant neoplasm of breast: Secondary | ICD-10-CM | POA: Insufficient documentation

## 2018-11-06 NOTE — Telephone Encounter (Signed)
Patient has been made aware of her results, my reminder was pending open note but has been completed. Barbara [name]

## 2019-06-15 ENCOUNTER — Encounter: Payer: Self-pay | Admitting: Nurse Practitioner

## 2019-06-15 ENCOUNTER — Other Ambulatory Visit: Payer: Self-pay

## 2019-06-15 ENCOUNTER — Ambulatory Visit: Payer: Managed Care, Other (non HMO) | Admitting: Nurse Practitioner

## 2019-06-15 VITALS — Temp 97.9°F | Ht 66.0 in | Wt 123.5 lb

## 2019-06-15 DIAGNOSIS — J0140 Acute pansinusitis, unspecified: Secondary | ICD-10-CM

## 2019-06-15 DIAGNOSIS — Z20822 Contact with and (suspected) exposure to covid-19: Secondary | ICD-10-CM

## 2019-06-15 DIAGNOSIS — Z20828 Contact with and (suspected) exposure to other viral communicable diseases: Secondary | ICD-10-CM | POA: Diagnosis not present

## 2019-06-15 MED ORDER — AZITHROMYCIN 250 MG PO TABS: ORAL_TABLET | ORAL | 0 refills | Status: DC

## 2019-06-15 NOTE — Progress Notes (Signed)
[facility] [place], [place] [address]  Internal MEDICINE  Telephone Visit  Patient Name: Barbara [name]  [id number]  [id number]  Date of Service: 06/15/2019  I connected with the patient at 4:53pm by webcam and verified the patients identity using two identifiers.   I discussed the limitations, risks, security and privacy concerns of performing an evaluation and management service by webcam and the availability of in person appointments. I also discussed with the patient that there may be a patient responsible charge related to the service.  The patient expressed understanding and agrees to proceed.    Chief Complaint  Patient presents with  . Telephone Assessment    pt has been exposed to her neighbors, entire family has [place]  . Telephone Screen  . Cough  . Fatigue  . Dizziness    felt very lightheaded in the morning  . Headache    The patient has been contacted via webcam for follow up visit due to concerns for spread of novel coronavirus. The patient states that she has been in contact with her neighbor. States that the entire family has COVID 62. She states that about 24 hours ago, she started with dry cough, sore throat. Headache, and body aches. She states she is very tired. The last exposure to her neighbor was approximately one week ago.       Current Medication: Outpatient Encounter Medications as of 06/15/2019  Medication Sig  . azithromycin (ZITHROMAX) 250 MG tablet z-pack - take as directed for 5 days for sinusitis  . carisoprodol (SOMA) 350 MG tablet Take 1 tablet (350 mg total) by mouth at bedtime.  . celecoxib (CELEBREX) 200 MG capsule Take 1 capsule (200 mg total) by mouth daily.  Marland Kitchen escitalopram (LEXAPRO) 20 MG tablet Take 1/2 tab po once a day and increase to 1 tab daily as directed  . estradiol (ESTRACE) 1 MG tablet Take 1 tablet (1 mg total) by mouth daily.  Marland Kitchen topiramate (TOPAMAX) 50 MG tablet Take 1 tablet (50 mg total) by mouth  daily.   No facility-administered encounter medications on file as of 06/15/2019.     Surgical History: Past Surgical History:  Procedure Laterality Date  . ABDOMINAL HYSTERECTOMY    . BREAST BIOPSY Left 2014   neg  . CHOLECYSTECTOMY    . cyst removed from neck    . FOOT SURGERY Right    2009  . NASAL SEPTUM SURGERY      Medical History: Past Medical History:  Diagnosis Date  . Allergic rhinitis   . Anemia   . Anxiety   . Migraines     Family History: Family History  Adopted: Yes    Social History   Socioeconomic History  . Marital status: Married    Spouse name: Not on file  . Number of children: Not on file  . Years of education: Not on file  . Highest education level: Not on file  Occupational History  . Not on file  Social Needs  . Financial resource strain: Not on file  . Food insecurity    Worry: Not on file    Inability: Not on file  . Transportation needs    Medical: Not on file    Non-medical: Not on file  Tobacco Use  . Smoking status: Current Every Day Smoker    Types: Cigarettes  . Smokeless tobacco: Never Used  . Tobacco comment: 7 a day  Substance and Sexual Activity  . Alcohol use: Never  Frequency: Never  . Drug use: Never  . Sexual activity: Not on file  Lifestyle  . Physical activity    Days per week: Not on file    Minutes per session: Not on file  . Stress: Not on file  Relationships  . Social Musician on phone: Not on file    Gets together: Not on file    Attends religious service: Not on file    Active member of club or organization: Not on file    Attends meetings of clubs or organizations: Not on file    Relationship status: Not on file  . Intimate partner violence    Fear of current or ex partner: Not on file    Emotionally abused: Not on file    Physically abused: Not on file    Forced sexual activity: Not on file  Other Topics Concern  . Not on file  Social History Narrative  . Not on file       Review of Systems  Constitutional: Positive for chills and fatigue. Negative for fever.  HENT: Positive for congestion, postnasal drip, rhinorrhea, sinus pain and sore throat. Negative for ear pain and voice change.   Respiratory: Positive for cough. Negative for wheezing.   Cardiovascular: Negative for chest pain and palpitations.  Musculoskeletal: Positive for arthralgias and myalgias.  Skin: Negative.   Neurological: Positive for dizziness and headaches.    Today's Vitals   06/15/19 1622  Temp: 97.9 F (36.6 C)  Weight: 123 lb 8 oz (56 kg)  Height: 5\' 6"  (1.676 m)   Body mass index is 19.93 kg/m.  Observation/Objective:   The patient is alert and oriented. She is pleasant and answers all questions appropriately. Breathing is non-labored. She is in no acute distress at this time. The patient does have nasal congestion.    Assessment/Plan:  1. Acute non-recurrent pansinusitis Start z-ppack. Take as directed for 5 days. Rest and increase fluids. Take OTC tylenol as needed and as indicated for headache and body acues - azithromycin (ZITHROMAX) 250 MG tablet; z-pack - take as directed for 5 days for sinusitis  Dispense: 6 tablet; Refill: 0  2. Close Exposure to Covid-19 Virus Send for COVID 19 testing. Will discuss resuts when available.  - Novel Coronavirus, NAA ([facility]) General Counseling: Brittay verbalizes understanding of the findings of today's phone visit and agrees with plan of treatment. I have discussed any further diagnostic evaluation that may be needed or ordered today. We also reviewed her medications today. she has been encouraged to call the office with any questions or concerns that should arise related to todays visit.     Person Under Monitoring Name: Barbara [name]  Location: [address] [place] [address]   Infection Prevention Recommendations for Individuals Confirmed to have, or Being Evaluated for, 2019 Novel Coronavirus (COVID-19)  Infection Who Receive Care at Home  Individuals who are confirmed to have, or are being evaluated for, COVID-19 should follow the prevention steps below until a healthcare provider or local or state health department says they can return to normal activities.  Stay home except to get medical care You should restrict activities outside your home, except for getting medical care. Do not go to work, school, or public areas, and do not use public transportation or taxis.  Call ahead before visiting your doctor Before your medical appointment, call the healthcare provider and tell them that you have, or are being evaluated for, COVID-19 infection. This will  help the healthcare provider's office take steps to keep other people from getting infected. Ask your healthcare provider to call the local or state health department.  Monitor your symptoms Seek prompt medical attention if your illness is worsening (e.g., difficulty breathing). Before going to your medical appointment, call the healthcare provider and tell them that you have, or are being evaluated for, COVID-19 infection. Ask your healthcare provider to call the local or state health department.  Wear a facemask You should wear a facemask that covers your nose and mouth when you are in the same room with other people and when you visit a healthcare provider. People who live with or visit you should also wear a facemask while they are in the same room with you.  Separate yourself from other people in your home As much as possible, you should stay in a different room from other people in your home. Also, you should use a separate bathroom, if available.  Avoid sharing household items You should not share dishes, drinking glasses, cups, eating utensils, towels, bedding, or other items with other people in your home. After using these items, you should wash them thoroughly with soap and water.  Cover your coughs and sneezes Cover your  mouth and nose with a tissue when you cough or sneeze, or you can cough or sneeze into your sleeve. Throw used tissues in a lined trash can, and immediately wash your hands with soap and water for at least 20 seconds or use an alcohol-based hand rub.  Wash your Union Pacific Corporationhands Wash your hands often and thoroughly with soap and water for at least 20 seconds. You can use an alcohol-based hand sanitizer if soap and water are not available and if your hands are not visibly dirty. Avoid touching your eyes, nose, and mouth with unwashed hands.   Prevention Steps for Caregivers and Household Members of Individuals Confirmed to have, or Being Evaluated for, COVID-19 Infection Being Cared for in the Home  If you live with, or provide care at home for, a person confirmed to have, or being evaluated for, COVID-19 infection please follow these guidelines to prevent infection:  Follow healthcare provider's instructions Make sure that you understand and can help the patient follow any healthcare provider instructions for all care.  Provide for the patient's basic needs You should help the patient with basic needs in the home and provide support for getting groceries, prescriptions, and other personal needs.  Monitor the patient's symptoms If they are getting sicker, call his or her medical provider and tell them that the patient has, or is being evaluated for, COVID-19 infection. This will help the healthcare provider's office take steps to keep other people from getting infected. Ask the healthcare provider to call the local or state health department.  Limit the number of people who have contact with the patient  If possible, have only one caregiver for the patient.  Other household members should stay in another home or place of residence. If this is not possible, they should stay  in another room, or be separated from the patient as much as possible. Use a separate bathroom, if available.  Restrict  visitors who do not have an essential need to be in the home.  Keep older adults, very young children, and other sick people away from the patient Keep older adults, very young children, and those who have compromised immune systems or chronic health conditions away from the patient. This includes people with chronic heart, lung, or  kidney conditions, diabetes, and cancer.  Ensure good ventilation Make sure that shared spaces in the home have good air flow, such as from an air conditioner or an opened window, weather permitting.  Wash your hands often  Wash your hands often and thoroughly with soap and water for at least 20 seconds. You can use an alcohol based hand sanitizer if soap and water are not available and if your hands are not visibly dirty.  Avoid touching your eyes, nose, and mouth with unwashed hands.  Use disposable paper towels to dry your hands. If not available, use dedicated cloth towels and replace them when they become wet.  Wear a facemask and gloves  Wear a disposable facemask at all times in the room and gloves when you touch or have contact with the patient's blood, body fluids, and/or secretions or excretions, such as sweat, saliva, sputum, nasal mucus, vomit, urine, or feces.  Ensure the mask fits over your nose and mouth tightly, and do not touch it during use.  Throw out disposable facemasks and gloves after using them. Do not reuse.  Wash your hands immediately after removing your facemask and gloves.  If your personal clothing becomes contaminated, carefully remove clothing and launder. Wash your hands after handling contaminated clothing.  Place all used disposable facemasks, gloves, and other waste in a lined container before disposing them with other household waste.  Remove gloves and wash your hands immediately after handling these items.  Do not share dishes, glasses, or other household items with the patient  Avoid sharing household items. You  should not share dishes, drinking glasses, cups, eating utensils, towels, bedding, or other items with a patient who is confirmed to have, or being evaluated for, COVID-19 infection.  After the person uses these items, you should wash them thoroughly with soap and water.  Wash laundry thoroughly  Immediately remove and wash clothes or bedding that have blood, body fluids, and/or secretions or excretions, such as sweat, saliva, sputum, nasal mucus, vomit, urine, or feces, on them.  Wear gloves when handling laundry from the patient.  Read and follow directions on labels of laundry or clothing items and detergent. In general, wash and dry with the warmest temperatures recommended on the label.  Clean all areas the individual has used often  Clean all touchable surfaces, such as counters, tabletops, doorknobs, bathroom fixtures, toilets, phones, keyboards, tablets, and bedside tables, every day. Also, clean any surfaces that may have blood, body fluids, and/or secretions or excretions on them.  Wear gloves when cleaning surfaces the patient has come in contact with.  Use a diluted bleach solution (e.g., dilute bleach with 1 part bleach and 10 parts water) or a household disinfectant with a label that says EPA-registered for coronaviruses. To make a bleach solution at home, add 1 tablespoon of bleach to 1 quart (4 cups) of water. For a larger supply, add  cup of bleach to 1 gallon (16 cups) of water.  Read labels of cleaning products and follow recommendations provided on product labels. Labels contain instructions for safe and effective use of the cleaning product including precautions you should take when applying the product, such as wearing gloves or eye protection and making sure you have good ventilation during use of the product.  Remove gloves and wash hands immediately after cleaning.  Monitor yourself for signs and symptoms of illness Caregivers and household members are considered  close contacts, should monitor their health, and will be asked to limit movement outside  of the home to the extent possible. Follow the monitoring steps for close contacts listed on the symptom monitoring form.   ? If you have additional questions, contact your local health department or call the epidemiologist on call at [phone] (available 24/7). ? This guidance is subject to change. For the most up-to-date guidance from [facility], please refer to their website: TripMetro.huhttps://www.cdc.gov/coronavirus/2019-ncov/hcp/guidance-prevent-spread.html  Orders Placed This Encounter  Procedures  . Novel Coronavirus, NAA ([facility])    Meds ordered this encounter  Medications  . azithromycin (ZITHROMAX) 250 MG tablet    Sig: z-pack - take as directed for 5 days for sinusitis    Dispense:  6 tablet    Refill:  0    Order Specific Question:   Supervising Provider    Answer:   Lyndon CodeKHAN, FOZIA M [[id number]]    Time spent: 5015 Minutes    Dr Lyndon CodeFozia M Khan Internal medicine

## 2019-06-16 ENCOUNTER — Other Ambulatory Visit: Payer: Self-pay

## 2019-06-16 DIAGNOSIS — Z20822 Contact with and (suspected) exposure to covid-19: Secondary | ICD-10-CM

## 2019-06-17 LAB — NOVEL CORONAVIRUS, NAA: SARS-CoV-2, NAA: NOT DETECTED

## 2019-06-17 NOTE — Progress Notes (Signed)
Please let the patient know that her COVID 19 test is negative. Thanks.

## 2019-06-18 ENCOUNTER — Telehealth: Payer: Self-pay

## 2019-06-18 NOTE — Telephone Encounter (Signed)
Pt advised covid test is negative  

## 2019-06-18 NOTE — Telephone Encounter (Signed)
-----   Message from Ronnell Freshwater, NP sent at 06/17/2019 12:27 PM EDT ----- Please let the patient know that her COVID 19 test is negative. Thanks.

## 2019-08-03 ENCOUNTER — Other Ambulatory Visit: Payer: Self-pay

## 2019-08-03 DIAGNOSIS — G43509 Persistent migraine aura without cerebral infarction, not intractable, without status migrainosus: Secondary | ICD-10-CM

## 2019-08-03 DIAGNOSIS — M150 Primary generalized (osteo)arthritis: Secondary | ICD-10-CM

## 2019-08-03 DIAGNOSIS — F324 Major depressive disorder, single episode, in partial remission: Secondary | ICD-10-CM

## 2019-08-03 MED ORDER — TOPIRAMATE 50 MG PO TABS: 50.0000 mg | ORAL_TABLET | Freq: Every day | ORAL | 1 refills | Status: DC

## 2019-08-03 MED ORDER — ESCITALOPRAM OXALATE 20 MG PO TABS: ORAL_TABLET | ORAL | 1 refills | Status: DC

## 2019-08-03 MED ORDER — CELECOXIB 200 MG PO CAPS: 200.0000 mg | ORAL_CAPSULE | Freq: Every day | ORAL | 1 refills | Status: DC

## 2019-08-14 ENCOUNTER — Other Ambulatory Visit: Payer: Self-pay | Admitting: Nurse Practitioner

## 2019-08-14 DIAGNOSIS — E2839 Other primary ovarian failure: Secondary | ICD-10-CM

## 2019-08-14 MED ORDER — ESTRADIOL 1 MG PO TABS: 1.0000 mg | ORAL_TABLET | Freq: Every day | ORAL | 3 refills | Status: DC

## 2019-09-25 ENCOUNTER — Telehealth: Payer: Self-pay

## 2019-09-25 NOTE — Telephone Encounter (Signed)
LMOM TO CONFIRM 09-29-19 OV AS VIRTUAL. °

## 2019-09-29 ENCOUNTER — Ambulatory Visit (INDEPENDENT_AMBULATORY_CARE_PROVIDER_SITE_OTHER): Payer: Managed Care, Other (non HMO) | Admitting: Nurse Practitioner

## 2019-09-29 ENCOUNTER — Other Ambulatory Visit: Payer: Self-pay

## 2019-09-29 ENCOUNTER — Encounter: Payer: Self-pay | Admitting: Nurse Practitioner

## 2019-09-29 VITALS — HR 66 | Temp 97.5°F | Ht 65.5 in | Wt 121.0 lb

## 2019-09-29 DIAGNOSIS — Z1231 Encounter for screening mammogram for malignant neoplasm of breast: Secondary | ICD-10-CM

## 2019-09-29 DIAGNOSIS — M150 Primary generalized (osteo)arthritis: Secondary | ICD-10-CM

## 2019-09-29 DIAGNOSIS — I6523 Occlusion and stenosis of bilateral carotid arteries: Secondary | ICD-10-CM

## 2019-09-29 DIAGNOSIS — F324 Major depressive disorder, single episode, in partial remission: Secondary | ICD-10-CM

## 2019-09-29 DIAGNOSIS — Z0001 Encounter for general adult medical examination with abnormal findings: Secondary | ICD-10-CM | POA: Diagnosis not present

## 2019-09-29 DIAGNOSIS — M62838 Other muscle spasm: Secondary | ICD-10-CM

## 2019-09-29 MED ORDER — CARISOPRODOL 350 MG PO TABS: 350.0000 mg | ORAL_TABLET | Freq: Every day | ORAL | 1 refills | Status: DC

## 2019-09-29 MED ORDER — ESCITALOPRAM OXALATE 20 MG PO TABS: ORAL_TABLET | ORAL | 3 refills | Status: DC

## 2019-09-29 MED ORDER — CELECOXIB 200 MG PO CAPS: 200.0000 mg | ORAL_CAPSULE | Freq: Every day | ORAL | 3 refills | Status: DC

## 2019-09-29 NOTE — Progress Notes (Signed)
[facility] [place], [place] [address]  Internal MEDICINE  Telephone Visit  Patient Name: Barbara [name]  [id number]  [id number]  Date of Service: 09/29/2019  I connected with the patient at 11:34am by webcam and verified the patients identity using two identifiers.   I discussed the limitations, risks, security and privacy concerns of performing an evaluation and management service by webcam and the availability of in person appointments. I also discussed with the patient that there may be a patient responsible charge related to the service.  The patient expressed understanding and agrees to proceed.    Chief Complaint  Patient presents with  . Telephone Assessment  . Telephone Screen  . Annual Exam  . Anemia  . Anxiety    The patient has been contacted via webcam for follow up visit due to concerns for spread of novel coronavirus. She presents for health maintenance exam. She states that she has noted increased in stress and worry since quarantine started in relation to spread of COVID 19. She states that she has been working from home since the onset. States that it was supposed to be temporary, however, she has been working from home for 10 months now. She does not foresee an end to this anytime soon. She states that she has been clenching her jaw a lot. She wakes up with her jaw and mouth sore. Pain radiates into the neck and shoulders as well. Will, sometimes, take the prescribed Soma to relieve tight muscles, but she does not want to take this too often. She has tried using OTC mouth guards. She was unable to tolerate this, made her feel as though she were choking.  She is due to have routine, fasting labs. She will be due to have screening mammogram in 10/2019. She did have carotid doppler study done last year. She had mild plaque formation with <50% stenosis, bilaterally. She should have a repeat study done for surveillance.       Current  Medication: Outpatient Encounter Medications as of 09/29/2019  Medication Sig  . carisoprodol (SOMA) 350 MG tablet Take 1 tablet (350 mg total) by mouth at bedtime.  . celecoxib (CELEBREX) 200 MG capsule Take 1 capsule (200 mg total) by mouth daily.  Marland Kitchen escitalopram (LEXAPRO) 20 MG tablet Take 1/2 tab po once a day and increase to 1 tab daily as directed  . estradiol (ESTRACE) 1 MG tablet Take 1 tablet (1 mg total) by mouth daily.  Marland Kitchen topiramate (TOPAMAX) 50 MG tablet Take 1 tablet (50 mg total) by mouth daily.  . [DISCONTINUED] carisoprodol (SOMA) 350 MG tablet Take 1 tablet (350 mg total) by mouth at bedtime.  . [DISCONTINUED] celecoxib (CELEBREX) 200 MG capsule Take 1 capsule (200 mg total) by mouth daily.  . [DISCONTINUED] escitalopram (LEXAPRO) 20 MG tablet Take 1/2 tab po once a day and increase to 1 tab daily as directed  . [DISCONTINUED] azithromycin (ZITHROMAX) 250 MG tablet z-pack - take as directed for 5 days for sinusitis (Patient not taking: Reported on 09/29/2019)   No facility-administered encounter medications on file as of 09/29/2019.     Surgical History: Past Surgical History:  Procedure Laterality Date  . ABDOMINAL HYSTERECTOMY    . BREAST BIOPSY Left 2014   neg  . CHOLECYSTECTOMY    . cyst removed from neck    . FOOT SURGERY Right    2009  . NASAL SEPTUM SURGERY      Medical History: Past Medical History:  Diagnosis Date  . Allergic rhinitis   . Anemia   . Anxiety   . Migraines     Family History: Family History  Adopted: Yes    Social History   Socioeconomic History  . Marital status: Married    Spouse name: Not on file  . Number of children: Not on file  . Years of education: Not on file  . Highest education level: Not on file  Occupational History  . Not on file  Social Needs  . Financial resource strain: Not on file  . Food insecurity    Worry: Not on file    Inability: Not on file  . Transportation needs    Medical: Not on file     Non-medical: Not on file  Tobacco Use  . Smoking status: Current Every Day Smoker    Types: Cigarettes  . Smokeless tobacco: Never Used  . Tobacco comment: 7 a day  Substance and Sexual Activity  . Alcohol use: Never    Frequency: Never  . Drug use: Never  . Sexual activity: Not on file  Lifestyle  . Physical activity    Days per week: Not on file    Minutes per session: Not on file  . Stress: Not on file  Relationships  . Social Musicianconnections    Talks on phone: Not on file    Gets together: Not on file    Attends religious service: Not on file    Active member of club or organization: Not on file    Attends meetings of clubs or organizations: Not on file    Relationship status: Not on file  . Intimate partner violence    Fear of current or ex partner: Not on file    Emotionally abused: Not on file    Physically abused: Not on file    Forced sexual activity: Not on file  Other Topics Concern  . Not on file  Social History Narrative  . Not on file      Review of Systems  Constitutional: Negative for chills, fatigue and unexpected weight change.  HENT: Negative for congestion, postnasal drip, rhinorrhea, sneezing and sore throat.        Clenching teeth. Sore jaw, especially in the mornings.   Respiratory: Negative for cough, chest tightness, shortness of breath and wheezing.   Cardiovascular: Negative for chest pain and palpitations.  Gastrointestinal: Negative for abdominal pain, constipation, diarrhea, nausea and vomiting.  Endocrine: Negative for cold intolerance, heat intolerance, polydipsia and polyuria.  Genitourinary: Negative for dysuria and frequency.  Musculoskeletal: Positive for arthralgias, myalgias and neck stiffness. Negative for back pain, joint swelling and neck pain.  Skin: Negative for rash.  Allergic/Immunologic: Negative for environmental allergies.  Neurological: Negative for dizziness, tremors, numbness and headaches.  Hematological: Negative for  adenopathy. Does not bruise/bleed easily.  Psychiatric/Behavioral: Negative for behavioral problems (Depression), sleep disturbance and suicidal ideas. The patient is nervous/anxious.     Today's Vitals   09/29/19 1103  Pulse: 66  Temp: (!) 97.5 F (36.4 C)  SpO2: 96%  Weight: 121 lb (54.9 kg)  Height: 5' 5.5" (1.664 m)   Body mass index is 19.83 kg/m.  Observation/Objective:   The patient is alert and oriented. She is pleasant and answers all questions appropriately. Breathing is non-labored. She is in no acute distress at this time.    Assessment/Plan:  1. Encounter for general adult medical examination with abnormal findings Annual health maintenance exam today.   2. Bilateral carotid artery  stenosis Reviewed carotid doppler study from last year showing mild plaque formation with <50% stenosis, bilaterally. Will get new study for surveillance.  - carotid dopler; Future  3. Depression, major, single episode, in partial remission (HCC) Generally stable. Continue lexapro as prescribed  - escitalopram (LEXAPRO) 20 MG tablet; Take 1/2 tab po once a day and increase to 1 tab daily as directed  Dispense: 90 tablet; Refill: 3  4. Primary generalized hypertrophic osteoarthrosis May take celebrex 200mg  daily.  - celecoxib (CELEBREX) 200 MG capsule; Take 1 capsule (200 mg total) by mouth daily.  Dispense: 90 capsule; Refill: 3  5. Muscle spasms of neck Recommend she take prescribed muscle relaxer at night, especially on those days which are particularly bothersome. New prescription sent to mail order pharmacy.  - carisoprodol (SOMA) 350 MG tablet; Take 1 tablet (350 mg total) by mouth at bedtime.  Dispense: 90 tablet; Refill: 1  6. Encounter for screening mammogram for malignant neoplasm of breast - screening mammo; Future  General Counseling: Lynnlee verbalizes understanding of the findings of today's phone visit and agrees with plan of treatment. I have discussed any further  diagnostic evaluation that may be needed or ordered today. We also reviewed her medications today. she has been encouraged to call the office with any questions or concerns that should arise related to todays visit.  This patient was seen by FNP Collaboration with Dr Vincent Gros as a part of collaborative care agreement  Orders Placed This Encounter  Procedures  . carotid dopler  . screening mammo    Meds ordered this encounter  Medications  . escitalopram (LEXAPRO) 20 MG tablet    Sig: Take 1/2 tab po once a day and increase to 1 tab daily as directed    Dispense:  90 tablet    Refill:  3    Order Specific Question:   Supervising Provider    Answer:   Lyndon Code [[id number]]  . celecoxib (CELEBREX) 200 MG capsule    Sig: Take 1 capsule (200 mg total) by mouth daily.    Dispense:  90 capsule    Refill:  3    Order Specific Question:   Supervising Provider    Answer:   Lyndon Code [[id number]]  . carisoprodol (SOMA) 350 MG tablet    Sig: Take 1 tablet (350 mg total) by mouth at bedtime.    Dispense:  90 tablet    Refill:  1    Order Specific Question:   Supervising Provider    Answer:   Lyndon Code [[id number]]    Time spent: 32 Minutes    Dr 22 Internal medicine

## 2019-10-28 ENCOUNTER — Telehealth: Payer: Self-pay

## 2019-10-28 NOTE — Telephone Encounter (Signed)
Confirmed appointment with patient. klh °

## 2019-10-30 ENCOUNTER — Other Ambulatory Visit: Payer: Managed Care, Other (non HMO)

## 2019-11-02 ENCOUNTER — Other Ambulatory Visit: Payer: Self-pay | Admitting: Nurse Practitioner

## 2019-11-02 DIAGNOSIS — Z1231 Encounter for screening mammogram for malignant neoplasm of breast: Secondary | ICD-10-CM

## 2019-11-11 ENCOUNTER — Telehealth: Payer: Self-pay

## 2019-11-11 NOTE — Telephone Encounter (Signed)
Confirmed patient ultrasound for 11/13/2019 

## 2019-11-12 ENCOUNTER — Telehealth: Payer: Self-pay

## 2019-11-12 NOTE — Telephone Encounter (Signed)
CONFIRMED 11-17-19 OV AS VIRTUAL. °

## 2019-11-13 ENCOUNTER — Other Ambulatory Visit: Payer: Self-pay

## 2019-11-13 ENCOUNTER — Ambulatory Visit: Payer: Managed Care, Other (non HMO)

## 2019-11-13 DIAGNOSIS — I6523 Occlusion and stenosis of bilateral carotid arteries: Secondary | ICD-10-CM | POA: Diagnosis not present

## 2019-11-17 ENCOUNTER — Encounter: Payer: Self-pay | Admitting: Nurse Practitioner

## 2019-11-17 ENCOUNTER — Other Ambulatory Visit: Payer: Self-pay

## 2019-11-17 ENCOUNTER — Ambulatory Visit: Payer: Managed Care, Other (non HMO) | Admitting: Nurse Practitioner

## 2019-11-17 VITALS — HR 70 | Temp 98.5°F | Ht 65.5 in | Wt 119.8 lb

## 2019-11-17 DIAGNOSIS — N390 Urinary tract infection, site not specified: Secondary | ICD-10-CM | POA: Diagnosis not present

## 2019-11-17 DIAGNOSIS — G43509 Persistent migraine aura without cerebral infarction, not intractable, without status migrainosus: Secondary | ICD-10-CM

## 2019-11-17 DIAGNOSIS — I6523 Occlusion and stenosis of bilateral carotid arteries: Secondary | ICD-10-CM

## 2019-11-17 MED ORDER — NITROFURANTOIN MONOHYD MACRO 100 MG PO CAPS: 100.0000 mg | ORAL_CAPSULE | Freq: Two times a day (BID) | ORAL | 0 refills | Status: AC

## 2019-11-17 NOTE — Progress Notes (Signed)
[facility] [address] [place], [place] [address]  Internal MEDICINE  Office Visit Note  Patient Name: Barbara [name]  [id number]  [id number]  Date of Service: 11/18/2019  Chief Complaint  Patient presents with  . Telephone Assessment  . Telephone Screen  . Follow-up    REVIEW CAROTID ULTRASOUND  . Dysuria    CLOUDY, BAD ODOR, DISCOMFORT, DID AT HOME AZO TEST STRIP, LEUKOCYTES WENT DARK PURPLE IMMEDIATELY     The patient has been contacted via telephone for follow up visit due to concerns for spread of novel coronavirus. The patient presents for follow up visit. She states that she has been experiencing urinary frequency with urination. She states that her urine is cloudy and has a negative odor. She states that she took AZO home test for UTI and the leukocytes and nitrites were positive on this test.  She has well maintained blood pressure. She did have carotid doppler study done since her last visit. Test was done for surveillance of carotid artery stenosis. The most recent study was done 09/2018. Showed mild plaque and <50% stenosis, bilaterally.       Current Medication: Outpatient Encounter Medications as of 11/17/2019  Medication Sig  . carisoprodol (SOMA) 350 MG tablet Take 1 tablet (350 mg total) by mouth at bedtime.  . celecoxib (CELEBREX) 200 MG capsule Take 1 capsule (200 mg total) by mouth daily.  Marland Kitchen escitalopram (LEXAPRO) 20 MG tablet Take 1/2 tab po once a day and increase to 1 tab daily as directed  . estradiol (ESTRACE) 1 MG tablet Take 1 tablet (1 mg total) by mouth daily.  Marland Kitchen topiramate (TOPAMAX) 50 MG tablet Take 1 tablet (50 mg total) by mouth daily.   No facility-administered encounter medications on file as of 11/17/2019.    Surgical History: Past Surgical History:  Procedure Laterality Date  . ABDOMINAL HYSTERECTOMY    . BREAST BIOPSY Left 2014   neg  . CHOLECYSTECTOMY    . cyst removed from neck    . FOOT SURGERY Right    2009  .  NASAL SEPTUM SURGERY      Medical History: Past Medical History:  Diagnosis Date  . Allergic rhinitis   . Anemia   . Anxiety   . Migraines     Family History: Family History  Adopted: Yes    Social History   Socioeconomic History  . Marital status: Married    Spouse name: Not on file  . Number of children: Not on file  . Years of education: Not on file  . Highest education level: Not on file  Occupational History  . Not on file  Tobacco Use  . Smoking status: Current Every Day Smoker    Types: Cigarettes  . Smokeless tobacco: Never Used  . Tobacco comment: 8-10 a day  Substance and Sexual Activity  . Alcohol use: Never  . Drug use: Never  . Sexual activity: Not on file  Other Topics Concern  . Not on file  Social History Narrative  . Not on file   Social Determinants of Health   Financial Resource Strain:   . Difficulty of Paying Living Expenses: Not on file  Food Insecurity:   . Worried About Programme researcher, broadcasting/film/video in the Last Year: Not on file  . Ran Out of Food in the Last Year: Not on file  Transportation Needs:   . Lack of Transportation (Medical): Not on file  . Lack of Transportation (Non-Medical): Not on file  Physical Activity:   . Days of Exercise per Week: Not on file  . Minutes of Exercise per Session: Not on file  Stress:   . Feeling of Stress : Not on file  Social Connections:   . Frequency of Communication with Friends and Family: Not on file  . Frequency of Social Gatherings with Friends and Family: Not on file  . Attends Religious Services: Not on file  . Active Member of Clubs or Organizations: Not on file  . Attends Archivist Meetings: Not on file  . Marital Status: Not on file  Intimate Partner Violence:   . Fear of Current or Ex-Partner: Not on file  . Emotionally Abused: Not on file  . Physically Abused: Not on file  . Sexually Abused: Not on file      Review of Systems  Constitutional: Negative for activity  change, chills, fatigue and unexpected weight change.  HENT: Negative for congestion, postnasal drip, rhinorrhea, sneezing and sore throat.   Respiratory: Negative for cough, chest tightness, shortness of breath and wheezing.   Cardiovascular: Negative for chest pain and palpitations.  Gastrointestinal: Negative for abdominal pain, constipation, diarrhea, nausea and vomiting.  Endocrine: Negative for cold intolerance, heat intolerance and polydipsia.  Genitourinary: Positive for dysuria, frequency and urgency.  Musculoskeletal: Positive for back pain. Negative for arthralgias, joint swelling and neck pain.  Skin: Negative for rash.  Allergic/Immunologic: Negative for environmental allergies.  Neurological: Positive for headaches. Negative for dizziness, tremors and numbness.  Hematological: Negative for adenopathy. Does not bruise/bleed easily.  Psychiatric/Behavioral: Negative for behavioral problems (Depression), sleep disturbance and suicidal ideas. The patient is not nervous/anxious.     Today's Vitals   11/17/19 1641  Pulse: 70  Temp: 98.5 F (36.9 C)  SpO2: 99%  Weight: 119 lb 12.8 oz (54.3 kg)  Height: 5' 5.5" (1.664 m)   Body mass index is 19.63 kg/m.  Observation:  The patient is alert and oriented. She is pleasant and answering questions appropriately. She is in no acute distress at this time.   Assessment/Plan: 1. Urinary tract infection without hematuria, site unspecified Start macrobid 100mg  bid for 7 days. Will send for lab testing if no better in few days.   2. Bilateral carotid artery stenosis Current test results unavailable. Prior study showing mild plaque with <50% stenosis, bilaterally. Will contact patient to review results as soon as available.   3. Persistent migraine aura without cerebral infarction and without status migrainosus, not intractable Stable. Continue topamax daily.   General Counseling: Halayna verbalizes understanding of the findings of  todays visit and agrees with plan of treatment. I have discussed any further diagnostic evaluation that may be needed or ordered today. We also reviewed her medications today. she has been encouraged to call the office with any questions or concerns that should arise related to todays visit.   This patient was seen by Leretha Pol FNP Collaboration with Dr Lavera Guise as a part of collaborative care agreement  Total time spent: 23 Minutes   Time spent includes review of chart, medications, test results, and follow up plan with the patient.      Dr Lavera Guise Internal medicine

## 2019-11-18 DIAGNOSIS — N390 Urinary tract infection, site not specified: Secondary | ICD-10-CM | POA: Insufficient documentation

## 2019-11-24 ENCOUNTER — Other Ambulatory Visit: Payer: Self-pay | Admitting: Nurse Practitioner

## 2019-11-24 ENCOUNTER — Ambulatory Visit
Admission: RE | Admit: 2019-11-24 | Discharge: 2019-11-24 | Disposition: A | Discharge status: Home / Self Care | Payer: Managed Care, Other (non HMO) | Source: Ambulatory Visit | Attending: Nurse Practitioner | Admitting: Nurse Practitioner

## 2019-11-24 DIAGNOSIS — Z1231 Encounter for screening mammogram for malignant neoplasm of breast: Secondary | ICD-10-CM | POA: Diagnosis not present

## 2019-11-24 DIAGNOSIS — N6489 Other specified disorders of breast: Secondary | ICD-10-CM

## 2019-11-24 DIAGNOSIS — R928 Other abnormal and inconclusive findings on diagnostic imaging of breast: Secondary | ICD-10-CM

## 2019-11-25 NOTE — Progress Notes (Signed)
Additional images recommended.

## 2019-11-30 ENCOUNTER — Telehealth: Payer: Self-pay

## 2019-11-30 NOTE — Telephone Encounter (Signed)
The results are still not in epic.

## 2019-12-01 NOTE — Telephone Encounter (Signed)
Pt advised that as per khan mild little plague no blockage otherwise normal try to quit smoking and we discuss in detailed at next visit

## 2019-12-02 NOTE — Procedures (Signed)
[facility] [address] [place], [place] [address]  DATE OF SERVICE: November 13, 2019  CAROTID DOPPLER INTERPRETATION:  Bilateral Carotid Ultrsasound and Color Doppler Examination was performed. The RIGHT CCA shows mild plaque in the vessel. The LEFT CCA shows mild plaque in the vessel. There was no intimal thickening noted in the RIGHT carotid artery. There was no intimal thickening in the LEFT carotid artery.  The RIGHT CCA shows peak systolic velocity of 77.9 cm per second. The end diastolic velocity is 25.6 cm per second on the RIGHT side. The RIGHT ICA shows peak systolic velocity of 114.5 per second. RIGHT sided ICA end diastolic velocity is 33 point.  Cm per second. The RIGHT ECA shows a peak systolic velocity of 96.9 cm per second. The ICA/CCA ratio is calculated to be 1.5. This suggests 50 to 60% stenosis. The Vertebral Artery shows antegrade flow.  The LEFT CCA shows peak systolic velocity of 76.1 cm per second. The end diastolic velocity is 16.9 cm per second on the LEFT side. The LEFT ICA shows peak systolic velocity of 101.1 per second. LEFT sided ICA end diastolic velocity is 36.3 cm per second. The LEFT ECA shows a peak systolic velocity of 120.7 cm per second. The ICA/CCA ratio is calculated to be 1.3. This suggests 50 to 60% stenosis. The Vertebral Artery shows antegrade flow.   Impression:    The RIGHT CAROTID shows 50 to 60% stenosis. The LEFT CAROTID shows 50 to 60% stenosis.  There is mild plaque formation noted on the LEFT and mild on the RIGHT  side. Consider a repeat Carotid doppler if clinical situation and symptoms warrant in 6-12 months. Patient should be encouraged to change lifestyles such as smoking cessation, regular exercise and dietary modification. Use of statins in the right clinical setting and ASA is encouraged.  Yevonne Pax, MD [facility] Pulmonary Critical Care Medicine

## 2019-12-04 ENCOUNTER — Ambulatory Visit: Admission: RE | Admit: 2019-12-04 | Payer: Managed Care, Other (non HMO) | Source: Ambulatory Visit

## 2019-12-04 ENCOUNTER — Other Ambulatory Visit: Payer: Managed Care, Other (non HMO)

## 2019-12-07 NOTE — Progress Notes (Signed)
Mild plaue with 50-60% stenosis, bilaterally. Will repeat study in one year.

## 2019-12-22 ENCOUNTER — Ambulatory Visit
Admission: RE | Admit: 2019-12-22 | Discharge: 2019-12-22 | Disposition: A | Discharge status: Home / Self Care | Payer: Managed Care, Other (non HMO) | Source: Ambulatory Visit | Attending: Nurse Practitioner | Admitting: Nurse Practitioner

## 2019-12-22 DIAGNOSIS — R928 Other abnormal and inconclusive findings on diagnostic imaging of breast: Secondary | ICD-10-CM | POA: Insufficient documentation

## 2019-12-22 DIAGNOSIS — N6489 Other specified disorders of breast: Secondary | ICD-10-CM

## 2019-12-23 NOTE — Progress Notes (Signed)
Patient to be scheduled for a 6 month follow up ultrasound for surveillance

## 2020-01-11 ENCOUNTER — Other Ambulatory Visit: Payer: Self-pay

## 2020-01-11 DIAGNOSIS — G43509 Persistent migraine aura without cerebral infarction, not intractable, without status migrainosus: Secondary | ICD-10-CM

## 2020-01-11 MED ORDER — TOPIRAMATE 50 MG PO TABS: 50.0000 mg | ORAL_TABLET | Freq: Every day | ORAL | 1 refills | Status: DC

## 2020-02-26 ENCOUNTER — Telehealth: Payer: Self-pay

## 2020-02-26 NOTE — Telephone Encounter (Signed)
Confirmed appointment on 03/01/2020 and screened for covid. klh °

## 2020-02-29 ENCOUNTER — Encounter: Payer: Self-pay | Admitting: Adult Health

## 2020-02-29 ENCOUNTER — Other Ambulatory Visit: Payer: Self-pay | Admitting: Adult Health

## 2020-02-29 ENCOUNTER — Other Ambulatory Visit: Payer: Self-pay

## 2020-02-29 ENCOUNTER — Ambulatory Visit: Payer: Managed Care, Other (non HMO) | Admitting: Adult Health

## 2020-02-29 VITALS — BP 121/70 | HR 66 | Temp 97.2°F | Resp 16 | Ht 65.5 in | Wt 116.0 lb

## 2020-02-29 DIAGNOSIS — R319 Hematuria, unspecified: Secondary | ICD-10-CM | POA: Diagnosis not present

## 2020-02-29 DIAGNOSIS — F17210 Nicotine dependence, cigarettes, uncomplicated: Secondary | ICD-10-CM | POA: Diagnosis not present

## 2020-02-29 DIAGNOSIS — N390 Urinary tract infection, site not specified: Secondary | ICD-10-CM | POA: Diagnosis not present

## 2020-02-29 DIAGNOSIS — R300 Dysuria: Secondary | ICD-10-CM | POA: Diagnosis not present

## 2020-02-29 LAB — POCT URINALYSIS DIPSTICK
Bilirubin, UA: NEGATIVE
Glucose, UA: NEGATIVE
Ketones, UA: NEGATIVE
Nitrite, UA: NEGATIVE
Protein, UA: NEGATIVE
Spec Grav, UA: 1.015 (ref 1.010–1.025)
Urobilinogen, UA: 0.2 E.U./dL
pH, UA: 6 (ref 5.0–8.0)

## 2020-02-29 MED ORDER — AMOXICILLIN-POT CLAVULANATE 875-125 MG PO TABS: 1.0000 | ORAL_TABLET | Freq: Two times a day (BID) | ORAL | 0 refills | Status: DC

## 2020-02-29 NOTE — Progress Notes (Signed)
[facility] [place], [place] [address]  Internal MEDICINE  Office Visit Note  Patient Name: Barbara [name]  [id number]  [id number]  Date of Service: 02/29/2020  Chief Complaint  Patient presents with  . Urinary Tract Infection    lower back pain, strong odor esp in the morning   . Labs Only    would like labs done before she does not have a job     HPI Pt is here for a sick visit. Pt reports over the last few weeks she has noticed low back pain, and malodorous urine.  She had a UTI in january and had Macrobid for 7 days.  She feels like the symptoms resolved, however she never got back to 100%. She has some urinary frequency, and hesitancy.    Current Medication:  Outpatient Encounter Medications as of 02/29/2020  Medication Sig  . carisoprodol (SOMA) 350 MG tablet Take 1 tablet (350 mg total) by mouth at bedtime.  . celecoxib (CELEBREX) 200 MG capsule Take 1 capsule (200 mg total) by mouth daily.  Marland Kitchen escitalopram (LEXAPRO) 20 MG tablet Take 1/2 tab po once a day and increase to 1 tab daily as directed  . estradiol (ESTRACE) 1 MG tablet Take 1 tablet (1 mg total) by mouth daily.  Marland Kitchen topiramate (TOPAMAX) 50 MG tablet Take 1 tablet (50 mg total) by mouth daily.   No facility-administered encounter medications on file as of 02/29/2020.      Medical History: Past Medical History:  Diagnosis Date  . Allergic rhinitis   . Anemia   . Anxiety   . Migraines      Vital Signs: BP 121/70   Pulse 66   Temp (!) 97.2 F (36.2 C)   Resp 16   Ht 5' 5.5" (1.664 m)   Wt 116 lb (52.6 kg)   SpO2 100%   BMI 19.01 kg/m    Review of Systems  Constitutional: Negative for chills, fatigue and unexpected weight change.  HENT: Negative for congestion, rhinorrhea, sneezing and sore throat.   Eyes: Negative for photophobia, pain and redness.  Respiratory: Negative for cough, chest tightness and shortness of breath.   Cardiovascular: Negative for chest pain  and palpitations.  Gastrointestinal: Negative for abdominal pain, constipation, diarrhea, nausea and vomiting.  Endocrine: Negative.   Genitourinary: Positive for dysuria, flank pain and frequency.  Musculoskeletal: Negative for arthralgias, back pain, joint swelling and neck pain.  Skin: Negative for rash.  Allergic/Immunologic: Negative.   Neurological: Negative for tremors and numbness.  Hematological: Negative for adenopathy. Does not bruise/bleed easily.  Psychiatric/Behavioral: Negative for behavioral problems and sleep disturbance. The patient is not nervous/anxious.     Physical Exam Vitals and nursing note reviewed.  Constitutional:      General: She is not in acute distress.    Appearance: She is well-developed. She is not diaphoretic.  HENT:     Head: Normocephalic and atraumatic.     Mouth/Throat:     Pharynx: No oropharyngeal exudate.  Eyes:     Pupils: Pupils are equal, round, and reactive to light.  Neck:     Thyroid: No thyromegaly.     Vascular: No JVD.     Trachea: No tracheal deviation.  Cardiovascular:     Rate and Rhythm: Normal rate and regular rhythm.     Heart sounds: Normal heart sounds. No murmur. No friction rub. No gallop.   Pulmonary:     Effort: Pulmonary effort is normal. No respiratory  distress.     Breath sounds: Normal breath sounds. No wheezing or rales.  Chest:     Chest wall: No tenderness.  Abdominal:     Palpations: Abdomen is soft.     Tenderness: There is no abdominal tenderness. There is no guarding.  Musculoskeletal:        General: Normal range of motion.     Cervical back: Normal range of motion and neck supple.  Lymphadenopathy:     Cervical: No cervical adenopathy.  Skin:    General: Skin is warm and dry.  Neurological:     Mental Status: She is alert and oriented to person, place, and time.     Cranial Nerves: No cranial nerve deficit.  Psychiatric:        Behavior: Behavior normal.        Thought Content: Thought  content normal.        Judgment: Judgment normal.    Assessment/Plan: 1. Urinary tract infection with hematuria, site unspecified Advised patient to take entire course of antibiotics as prescribed with food. Pt should return to clinic in 7-10 days if symptoms fail to improve or new symptoms develop.  - CULTURE, URINE COMPREHENSIVE - amoxicillin-clavulanate (AUGMENTIN) 875-125 MG tablet; Take 1 tablet by mouth 2 (two) times daily.  Dispense: 14 tablet; Refill: 0  2. Cigarette nicotine dependence without complication Smoking cessation counseling: 1. Pt acknowledges the risks of long term smoking, she will try to quite smoking. 2. Options for different medications including nicotine products, chewing gum, patch etc, Wellbutrin and Chantix is discussed 3. Goal and date of compete cessation is discussed 4. Total time spent in smoking cessation is 15 min.  3. Dysuria Positive for leukocytes, and trace RBC - POCT Urinalysis Dipstick  General Counseling: Barbara [name] verbalizes understanding of the findings of todays visit and agrees with plan of treatment. I have discussed any further diagnostic evaluation that may be needed or ordered today. We also reviewed her medications today. she has been encouraged to call the office with any questions or concerns that should arise related to todays visit.   Orders Placed This Encounter  Procedures  . CULTURE, URINE COMPREHENSIVE  . POCT Urinalysis Dipstick    No orders of the defined types were placed in this encounter.   Time spent: 30 Minutes  This patient was seen by Blima Ledger AGNP-C in Collaboration with Dr Lyndon Code as a part of collaborative care agreement.  Johnna Acosta AGNP-C Internal Medicine

## 2020-03-01 LAB — CBC WITH DIFFERENTIAL/PLATELET
Basophils Absolute: 0.1 10*3/uL (ref 0.0–0.2)
Basos: 1 %
EOS (ABSOLUTE): 0.3 10*3/uL (ref 0.0–0.4)
Eos: 5 %
Hematocrit: 38.1 % (ref 34.0–46.6)
Hemoglobin: 12.6 g/dL (ref 11.1–15.9)
Immature Grans (Abs): 0 10*3/uL (ref 0.0–0.1)
Immature Granulocytes: 0 %
Lymphocytes Absolute: 3 10*3/uL (ref 0.7–3.1)
Lymphs: 48 %
MCH: 32.1 pg (ref 26.6–33.0)
MCHC: 33.1 g/dL (ref 31.5–35.7)
MCV: 97 fL (ref 79–97)
Monocytes Absolute: 0.5 10*3/uL (ref 0.1–0.9)
Monocytes: 8 %
Neutrophils Absolute: 2.4 10*3/uL (ref 1.4–7.0)
Neutrophils: 38 %
Platelets: 224 10*3/uL (ref 150–450)
RBC: 3.92 x10E6/uL (ref 3.77–5.28)
RDW: 12.7 % (ref 11.7–15.4)
WBC: 6.3 10*3/uL (ref 3.4–10.8)

## 2020-03-01 LAB — B12 AND FOLATE PANEL
Folate: 3.2 ng/mL (ref >3.0)
Vitamin B-12: 219 pg/mL — ABNORMAL LOW (ref 232–1245)

## 2020-03-01 LAB — LIPID PANEL WITH LDL/HDL RATIO
Cholesterol, Total: 201 mg/dL — ABNORMAL HIGH (ref 100–199)
HDL: 58 mg/dL (ref >39)
LDL Chol Calc (NIH): 122 mg/dL — ABNORMAL HIGH (ref 0–99)
LDL/HDL Ratio: 2.1 ratio (ref 0.0–3.2)
Triglycerides: 118 mg/dL (ref 0–149)
VLDL Cholesterol Cal: 21 mg/dL (ref 5–40)

## 2020-03-01 LAB — COMPREHENSIVE METABOLIC PANEL
ALT: 7 IU/L (ref 0–32)
AST: 10 IU/L (ref 0–40)
Albumin/Globulin Ratio: 2 (ref 1.2–2.2)
Albumin: 4.4 g/dL (ref 3.8–4.9)
Alkaline Phosphatase: 36 IU/L — ABNORMAL LOW (ref 39–117)
BUN/Creatinine Ratio: 17 (ref 9–23)
BUN: 17 mg/dL (ref 6–24)
Bilirubin Total: 0.3 mg/dL (ref 0.0–1.2)
CO2: 21 mmol/L (ref 20–29)
Calcium: 8.9 mg/dL (ref 8.7–10.2)
Chloride: 107 mmol/L — ABNORMAL HIGH (ref 96–106)
Creatinine, Ser: 1.03 mg/dL — ABNORMAL HIGH (ref 0.57–1.00)
GFR calc Af Amer: 69 mL/min/{1.73_m2} (ref >59)
GFR calc non Af Amer: 60 mL/min/{1.73_m2} (ref >59)
Globulin, Total: 2.2 g/dL (ref 1.5–4.5)
Glucose: 87 mg/dL (ref 65–99)
Potassium: 4.5 mmol/L (ref 3.5–5.2)
Sodium: 140 mmol/L (ref 134–144)
Total Protein: 6.6 g/dL (ref 6.0–8.5)

## 2020-03-01 LAB — T3: T3, Total: 108 ng/dL (ref 71–180)

## 2020-03-01 LAB — IRON AND TIBC
Iron Saturation: 45 % (ref 15–55)
Iron: 87 ug/dL (ref 27–159)
Total Iron Binding Capacity: 194 ug/dL — ABNORMAL LOW (ref 250–450)
UIBC: 107 ug/dL — ABNORMAL LOW (ref 131–425)

## 2020-03-01 LAB — TSH: TSH: 2.53 u[IU]/mL (ref 0.450–4.500)

## 2020-03-01 LAB — T4, FREE: Free T4: 1.21 ng/dL (ref 0.82–1.77)

## 2020-03-01 LAB — VITAMIN D 25 HYDROXY (VIT D DEFICIENCY, FRACTURES): Vit D, 25-Hydroxy: 76.6 ng/mL (ref 30.0–100.0)

## 2020-03-01 LAB — FERRITIN: Ferritin: 143 ng/mL (ref 15–150)

## 2020-03-03 LAB — CULTURE, URINE COMPREHENSIVE

## 2020-03-14 ENCOUNTER — Telehealth: Payer: Self-pay

## 2020-03-14 NOTE — Telephone Encounter (Signed)
Pt was notified b12 was low. Made b12 injection appt for the next 3 weeks.

## 2020-03-14 NOTE — Progress Notes (Signed)
Hey. Can we go ahead and set her up for the weekly b12 injections for 3 weeks then monthly after that? Thanks.

## 2020-03-14 NOTE — Telephone Encounter (Signed)
-----   Message from Barbara Code, MD sent at 03/14/2020  8:32 AM EDT ----- Pt has low b12,  her app is late in July, can we go ahead and start her on B12 inj q week x 3 and onece a month there after, please HB  ----- Message ----- From: Interface, [facility] Lab Results In Sent: 03/08/2020  10:49 AM EDT To: Johnna Acosta, NP

## 2020-03-16 ENCOUNTER — Ambulatory Visit (INDEPENDENT_AMBULATORY_CARE_PROVIDER_SITE_OTHER): Payer: Managed Care, Other (non HMO)

## 2020-03-16 ENCOUNTER — Other Ambulatory Visit: Payer: Self-pay

## 2020-03-16 DIAGNOSIS — E538 Deficiency of other specified B group vitamins: Secondary | ICD-10-CM

## 2020-03-16 MED ORDER — CYANOCOBALAMIN 1000 MCG/ML IJ SOLN
1000.0000 ug | Freq: Once | INTRAMUSCULAR | Status: AC
  Administered 2020-03-16: 1000 ug via INTRAMUSCULAR

## 2020-03-23 ENCOUNTER — Other Ambulatory Visit: Payer: Self-pay

## 2020-03-23 ENCOUNTER — Ambulatory Visit (INDEPENDENT_AMBULATORY_CARE_PROVIDER_SITE_OTHER): Payer: Managed Care, Other (non HMO)

## 2020-03-23 DIAGNOSIS — E538 Deficiency of other specified B group vitamins: Secondary | ICD-10-CM

## 2020-03-23 MED ORDER — CYANOCOBALAMIN 1000 MCG/ML IJ SOLN
1000.0000 ug | Freq: Once | INTRAMUSCULAR | Status: AC
  Administered 2020-03-23: 1000 ug via INTRAMUSCULAR

## 2020-03-30 ENCOUNTER — Ambulatory Visit: Payer: Self-pay

## 2020-03-31 ENCOUNTER — Ambulatory Visit (INDEPENDENT_AMBULATORY_CARE_PROVIDER_SITE_OTHER): Payer: Managed Care, Other (non HMO)

## 2020-03-31 ENCOUNTER — Other Ambulatory Visit: Payer: Self-pay

## 2020-03-31 DIAGNOSIS — E538 Deficiency of other specified B group vitamins: Secondary | ICD-10-CM | POA: Diagnosis not present

## 2020-03-31 MED ORDER — CYANOCOBALAMIN 1000 MCG/ML IJ SOLN
1000.0000 ug | Freq: Once | INTRAMUSCULAR | Status: AC
  Administered 2020-03-31: 1000 ug via INTRAMUSCULAR

## 2020-04-29 ENCOUNTER — Other Ambulatory Visit: Payer: Self-pay

## 2020-04-29 ENCOUNTER — Ambulatory Visit (INDEPENDENT_AMBULATORY_CARE_PROVIDER_SITE_OTHER): Payer: Managed Care, Other (non HMO)

## 2020-04-29 DIAGNOSIS — E538 Deficiency of other specified B group vitamins: Secondary | ICD-10-CM | POA: Diagnosis not present

## 2020-04-29 MED ORDER — CYANOCOBALAMIN 1000 MCG/ML IJ SOLN
1000.0000 ug | Freq: Once | INTRAMUSCULAR | Status: AC
  Administered 2020-04-29: 1000 ug via INTRAMUSCULAR

## 2020-05-17 ENCOUNTER — Ambulatory Visit: Payer: Managed Care, Other (non HMO) | Admitting: Nurse Practitioner

## 2020-05-24 ENCOUNTER — Telehealth: Payer: Self-pay

## 2020-05-24 NOTE — Telephone Encounter (Signed)
Confirmed and screened for 06-20-20 ov. 

## 2020-05-25 ENCOUNTER — Other Ambulatory Visit: Payer: Self-pay | Admitting: Internal Medicine

## 2020-05-25 DIAGNOSIS — E2839 Other primary ovarian failure: Secondary | ICD-10-CM

## 2020-05-26 ENCOUNTER — Other Ambulatory Visit: Payer: Self-pay | Admitting: Adult Health

## 2020-05-26 ENCOUNTER — Encounter: Payer: Self-pay | Admitting: Nurse Practitioner

## 2020-05-26 ENCOUNTER — Other Ambulatory Visit: Payer: Self-pay

## 2020-05-26 ENCOUNTER — Ambulatory Visit (INDEPENDENT_AMBULATORY_CARE_PROVIDER_SITE_OTHER): Payer: Managed Care, Other (non HMO) | Admitting: Hospice and Palliative Medicine

## 2020-05-26 DIAGNOSIS — I6523 Occlusion and stenosis of bilateral carotid arteries: Secondary | ICD-10-CM | POA: Diagnosis not present

## 2020-05-26 DIAGNOSIS — R7989 Other specified abnormal findings of blood chemistry: Secondary | ICD-10-CM | POA: Diagnosis not present

## 2020-05-26 DIAGNOSIS — M62838 Other muscle spasm: Secondary | ICD-10-CM

## 2020-05-26 DIAGNOSIS — G43509 Persistent migraine aura without cerebral infarction, not intractable, without status migrainosus: Secondary | ICD-10-CM

## 2020-05-26 DIAGNOSIS — E538 Deficiency of other specified B group vitamins: Secondary | ICD-10-CM | POA: Diagnosis not present

## 2020-05-26 DIAGNOSIS — F17210 Nicotine dependence, cigarettes, uncomplicated: Secondary | ICD-10-CM | POA: Diagnosis not present

## 2020-05-26 MED ORDER — CARISOPRODOL 350 MG PO TABS: 350.0000 mg | ORAL_TABLET | Freq: Every day | ORAL | 0 refills | Status: DC

## 2020-05-26 MED ORDER — TOPIRAMATE 50 MG PO TABS: 50.0000 mg | ORAL_TABLET | Freq: Every day | ORAL | 1 refills | Status: DC

## 2020-05-26 MED ORDER — ATORVASTATIN CALCIUM 10 MG PO TABS: 10.0000 mg | ORAL_TABLET | Freq: Every day | ORAL | 3 refills | Status: DC

## 2020-05-26 MED ORDER — CYANOCOBALAMIN 1000 MCG/ML IJ SOLN
1000.0000 ug | Freq: Once | INTRAMUSCULAR | Status: AC
  Administered 2020-05-26: 1000 ug via INTRAMUSCULAR

## 2020-05-26 NOTE — Progress Notes (Signed)
[facility] [address] [place], [place] [address]  Internal MEDICINE  Office Visit Note  Patient Name: Barbara [name]  [id number]  [id number]  Date of Service: 05/29/2020  Chief Complaint  Patient presents with  . Follow-up  . Anxiety  . Anemia    HPI Patient is here today for routine follow-up. Asked to review carotid US from January. Bilateral carotid 50-60% stenosed. Discussed implications of these findings and the importance of incorporating lifestyle changes. Currently still smoking, encouraged her to become serious about complete cessation as this impacts her overall health. Reports a sedentary lifestyle, encouraged her to began incorporating exercise into her daily routine.Discussed with her starting a low dose statin medication due to carotid stenosis in addition to her lifestyle with smoking and sedentary. Lipid panel revels slightly elevated total cholesterol. Will repeat US in 6-12 months to monitor stenosis. Complains of feeling tired during the day, feels as htoug this is from stress at her job but feels as though her anxiety is being well managed. Labs from May reviewed. Serum creatinine slightly elevated, will recheck. Vitamin B12 low, will start replacement therapy.  Current Medication: Outpatient Encounter Medications as of 05/26/2020  Medication Sig  . celecoxib (CELEBREX) 200 MG capsule Take 1 capsule (200 mg total) by mouth daily.  Marland Kitchen escitalopram (LEXAPRO) 20 MG tablet Take 1/2 tab po once a day and increase to 1 tab daily as directed  . estradiol (ESTRACE) 1 MG tablet TAKE 1 TABLET BY MOUTH  DAILY  . [DISCONTINUED] carisoprodol (SOMA) 350 MG tablet Take 1 tablet (350 mg total) by mouth at bedtime.  . [DISCONTINUED] topiramate (TOPAMAX) 50 MG tablet Take 1 tablet (50 mg total) by mouth daily.  Marland Kitchen atorvastatin (LIPITOR) 10 MG tablet Take 1 tablet (10 mg total) by mouth daily.  . [DISCONTINUED] amoxicillin-clavulanate (AUGMENTIN) 875-125 MG tablet Take 1  tablet by mouth 2 (two) times daily.  . [EXPIRED] cyanocobalamin ((VITAMIN B-12)) injection 1,000 mcg    No facility-administered encounter medications on file as of 05/26/2020.    Surgical History: Past Surgical History:  Procedure Laterality Date  . ABDOMINAL HYSTERECTOMY    . BREAST BIOPSY Left 2014   neg  . CHOLECYSTECTOMY    . cyst removed from neck    . FOOT SURGERY Right    2009  . NASAL SEPTUM SURGERY      Medical History: Past Medical History:  Diagnosis Date  . Allergic rhinitis   . Anemia   . Anxiety   . Migraines     Family History: Family History  Adopted: Yes    Social History   Socioeconomic History  . Marital status: Married    Spouse name: Not on file  . Number of children: Not on file  . Years of education: Not on file  . Highest education level: Not on file  Occupational History  . Not on file  Tobacco Use  . Smoking status: Current Every Day Smoker    Types: Cigarettes  . Smokeless tobacco: Never Used  . Tobacco comment: 8-10 a day  Substance and Sexual Activity  . Alcohol use: Never  . Drug use: Never  . Sexual activity: Not on file  Other Topics Concern  . Not on file  Social History Narrative  . Not on file   Social Determinants of Health   Financial Resource Strain:   . Difficulty of Paying Living Expenses:   Food Insecurity:   . Worried About Programme researcher, broadcasting/film/video in the Last  Year:   . Ran Out of Food in the Last Year:   Transportation Needs:   . Freight forwarder (Medical):   Marland Kitchen Lack of Transportation (Non-Medical):   Physical Activity:   . Days of Exercise per Week:   . Minutes of Exercise per Session:   Stress:   . Feeling of Stress :   Social Connections:   . Frequency of Communication with Friends and Family:   . Frequency of Social Gatherings with Friends and Family:   . Attends Religious Services:   . Active Member of Clubs or Organizations:   . Attends Banker Meetings:   Marland Kitchen Marital Status:    Intimate Partner Violence:   . Fear of Current or Ex-Partner:   . Emotionally Abused:   Marland Kitchen Physically Abused:   . Sexually Abused:    Review of Systems  Constitutional: Positive for fatigue.       Negative for chills, fever  HENT: Negative.        For sinus pain, sinus pressure, sore throat, trouble swallowing.  Eyes: Negative.        For changes in vision or visual disturbances.  Respiratory: Negative.        For chest tightness, cough, shortness of breath, wheezing.  Cardiovascular: Negative.        For chest pain, ankle swelling, palpitations.  Gastrointestinal: Negative.        For abdominal pain, constipation, nausea, vomiting, diarrhea.  Endocrine: Negative.        For polydipsia, polyphagia, polyuria.  Genitourinary: Negative.        For dysuria, flank pain, hematuria, increased frequency, urgency.  Musculoskeletal: Negative.        For arthralgias, myalgias, back pain, neck pain, gait disturbances.  Skin:       For rash, wound.  Allergic/Immunologic: Negative.   Neurological: Negative.        For dizziness, headaches, tremors, weakness.  Hematological: Negative.   Psychiatric/Behavioral: Negative.        For confusion, depression, anxiety, sleep disturbances.    Vital Signs: BP 112/62   Pulse 65   Temp (!) 97.3 F (36.3 C)   Resp 16   Ht 5' 5.5" (1.664 m)   Wt 117 lb 6.4 oz (53.3 kg)   SpO2 99%   BMI 19.24 kg/m    Physical Exam Vitals reviewed.  Constitutional:      Appearance: Normal appearance. She is normal weight.  HENT:     Nose: Nose normal.     Mouth/Throat:     Mouth: Mucous membranes are moist.     Pharynx: Oropharynx is clear.  Cardiovascular:     Rate and Rhythm: Normal rate and regular rhythm.     Pulses: Normal pulses.     Heart sounds: Murmur heard.   Pulmonary:     Effort: Pulmonary effort is normal.     Breath sounds: Normal breath sounds.  Abdominal:     General: Abdomen is flat. Bowel sounds are normal.     Palpations:  Abdomen is soft.  Musculoskeletal:        General: Normal range of motion.     Cervical back: Normal range of motion.  Skin:    General: Skin is warm.  Neurological:     General: No focal deficit present.     Mental Status: She is alert and oriented to person, place, and time. Mental status is at baseline.  Psychiatric:        Mood and  Affect: Mood normal.        Behavior: Behavior normal.        Thought Content: Thought content normal.    Assessment/Plan: 1. Elevated serum creatinine Slightly elevated serum creatinine on last check. Will repeat to monitor. - Basic Metabolic Panel (BMET)  2. Vitamin B 12 deficiency Vitamin B12 levels found to be low.Will start monthly replacement therapy. - B12 - cyanocobalamin ((VITAMIN B-12)) injection 1,000 mcg  3. Carotid stenosis, asymptomatic, bilateral Carotid US revealed bilateral stenosis of 50-60%. Lifestyle is sedentary and she is currently smoking. Will start low dose statin therapy as she is apprehensive to start medication due to hearing about potential side effects from friends. Will increase dose as tolerated. - atorvastatin (LIPITOR) 10 MG tablet; Take 1 tablet (10 mg total) by mouth daily.  Dispense: 90 tablet; Refill: 3 - US Carotid Bilateral; Future  4. Cigarette nicotine dependence without complication Discussed with her the importance of smoking cessation. Smoking cessation counseling: 1. Pt acknowledges the risks of long term smoking, she will try to quite smoking. 2. Options for different medications including nicotine products, chewing gum, patch etc, Wellbutrin and Chantix is discussed 3. Goal and date of compete cessation is discussed 4. Total time spent in smoking cessation is 15 min.  General Counseling: Talor verbalizes understanding of the findings of todays visit and agrees with plan of treatment. I have discussed any further diagnostic evaluation that may be needed or ordered today. We also reviewed her  medications today. she has been encouraged to call the office with any questions or concerns that should arise related to todays visit.    Orders Placed This Encounter  Procedures  . US Carotid Bilateral  . Basic Metabolic Panel (BMET)  . B12    Meds ordered this encounter  Medications  . atorvastatin (LIPITOR) 10 MG tablet    Sig: Take 1 tablet (10 mg total) by mouth daily.    Dispense:  90 tablet    Refill:  3  . cyanocobalamin ((VITAMIN B-12)) injection 1,000 mcg    Total time spent: 20 Minutes  This patient was seen today by Leeanne Deed, AGNP-C in collaboration with Dr. Lyndon Code as part of a collaborative care agreement.  Time spent includes review of chart, medications, test results, and follow up plan with the patient.    Leeanne Deed, AGNP-C  Dr Lyndon Code Internal medicine

## 2020-05-26 NOTE — Progress Notes (Signed)
Sent Refill for Carisoprodol.

## 2020-05-27 ENCOUNTER — Ambulatory Visit: Payer: Managed Care, Other (non HMO)

## 2020-05-30 ENCOUNTER — Ambulatory Visit: Payer: Managed Care, Other (non HMO)

## 2020-06-28 ENCOUNTER — Other Ambulatory Visit: Payer: Self-pay

## 2020-06-28 ENCOUNTER — Ambulatory Visit (INDEPENDENT_AMBULATORY_CARE_PROVIDER_SITE_OTHER): Payer: Managed Care, Other (non HMO)

## 2020-06-28 DIAGNOSIS — E538 Deficiency of other specified B group vitamins: Secondary | ICD-10-CM

## 2020-06-28 MED ORDER — CYANOCOBALAMIN 1000 MCG/ML IJ SOLN
1000.0000 ug | Freq: Once | INTRAMUSCULAR | Status: AC
  Administered 2020-06-28: 1000 ug via INTRAMUSCULAR

## 2020-07-28 ENCOUNTER — Other Ambulatory Visit: Payer: Self-pay

## 2020-07-28 ENCOUNTER — Ambulatory Visit (INDEPENDENT_AMBULATORY_CARE_PROVIDER_SITE_OTHER): Payer: Managed Care, Other (non HMO)

## 2020-07-28 DIAGNOSIS — E538 Deficiency of other specified B group vitamins: Secondary | ICD-10-CM

## 2020-07-28 MED ORDER — CYANOCOBALAMIN 1000 MCG/ML IJ SOLN
1000.0000 ug | Freq: Once | INTRAMUSCULAR | Status: AC
  Administered 2020-07-28: 1000 ug via INTRAMUSCULAR

## 2020-07-29 LAB — BASIC METABOLIC PANEL
BUN/Creatinine Ratio: 22 (ref 9–23)
BUN: 22 mg/dL (ref 6–24)
CO2: 23 mmol/L (ref 20–29)
Calcium: 9.3 mg/dL (ref 8.7–10.2)
Chloride: 105 mmol/L (ref 96–106)
Creatinine, Ser: 1.01 mg/dL — ABNORMAL HIGH (ref 0.57–1.00)
GFR calc Af Amer: 70 mL/min/{1.73_m2} (ref >59)
GFR calc non Af Amer: 61 mL/min/{1.73_m2} (ref >59)
Glucose: 70 mg/dL (ref 65–99)
Potassium: 4.4 mmol/L (ref 3.5–5.2)
Sodium: 141 mmol/L (ref 134–144)

## 2020-07-29 LAB — VITAMIN B12: Vitamin B-12: 440 pg/mL (ref 232–1245)

## 2020-08-01 IMAGING — US US BREAST*L* LIMITED INC AXILLA
1 series · 5 of 5 positions shown · non-contrast
Comparison: Previous exam(s).

CLINICAL DATA: Screening recall for a left breast asymmetry.

EXAM:
DIGITAL DIAGNOSTIC LEFT MAMMOGRAM WITH CAD AND TOMO
ULTRASOUND LEFT BREAST

[Series 1: us breast*left* limited inc axilla · 0.05mm/px · 5 of 5 slices shown]
[im 1/5]
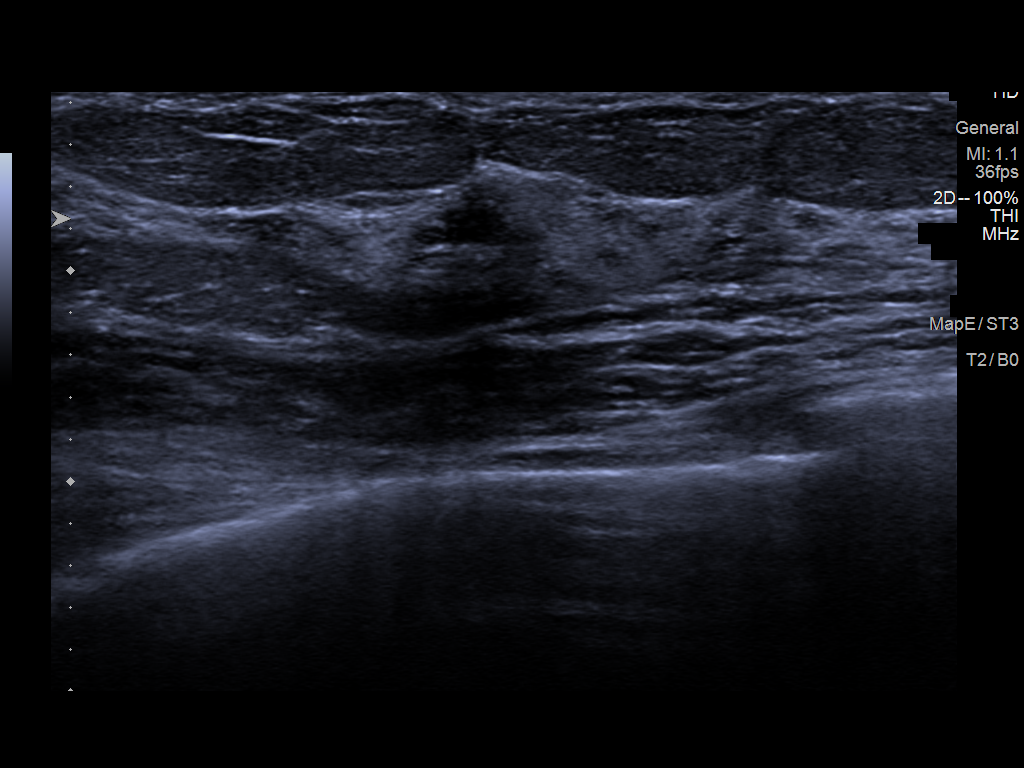
[im 2/5]
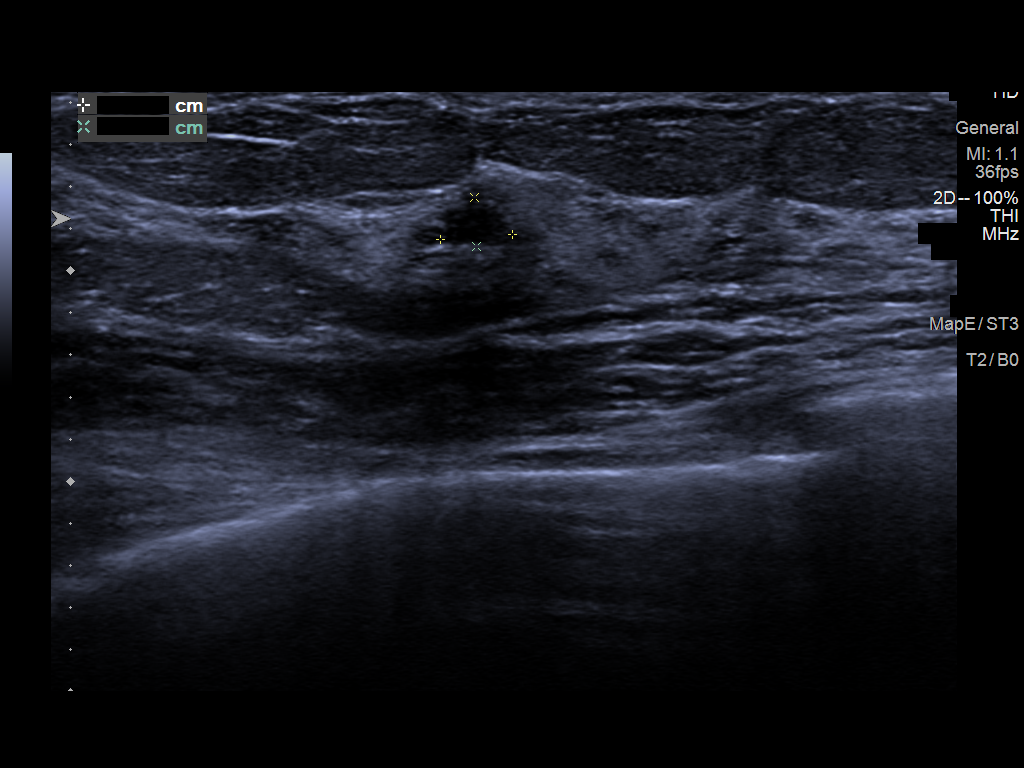
[im 3/5]
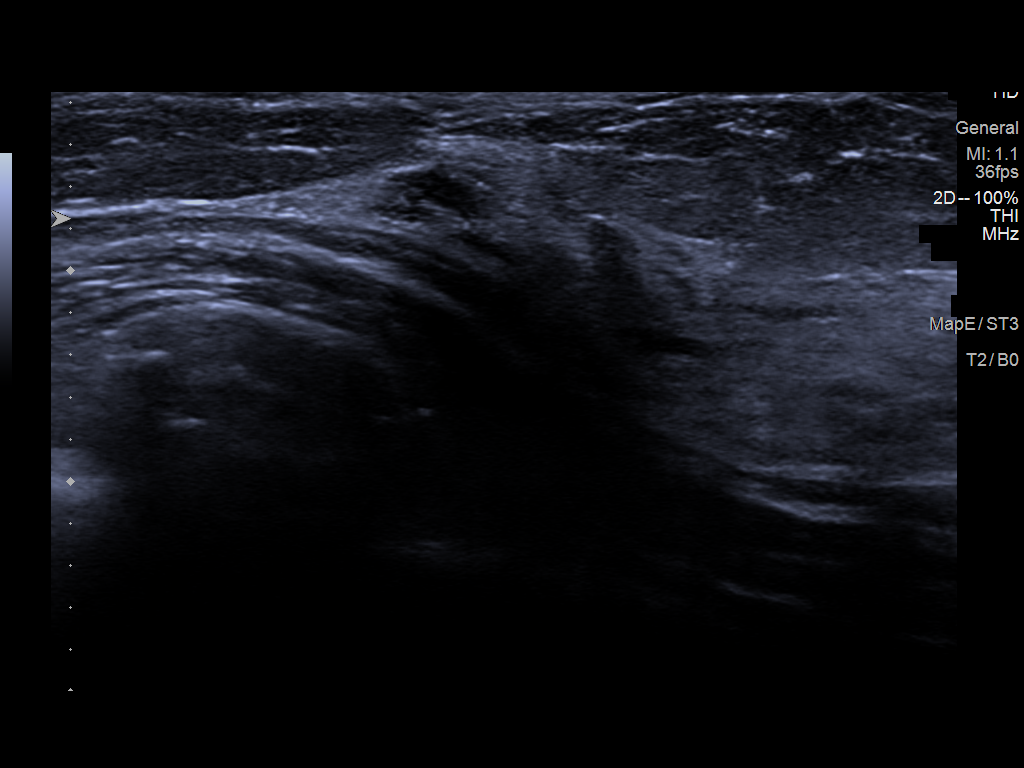
[im 4/5]
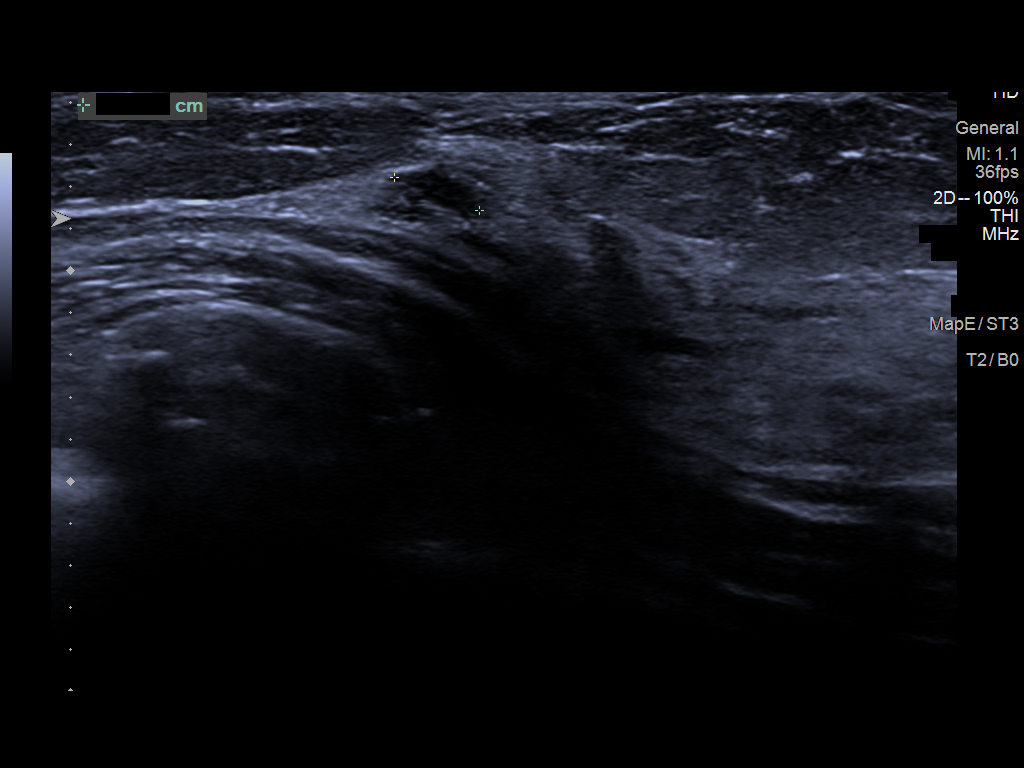
[im 5/5]
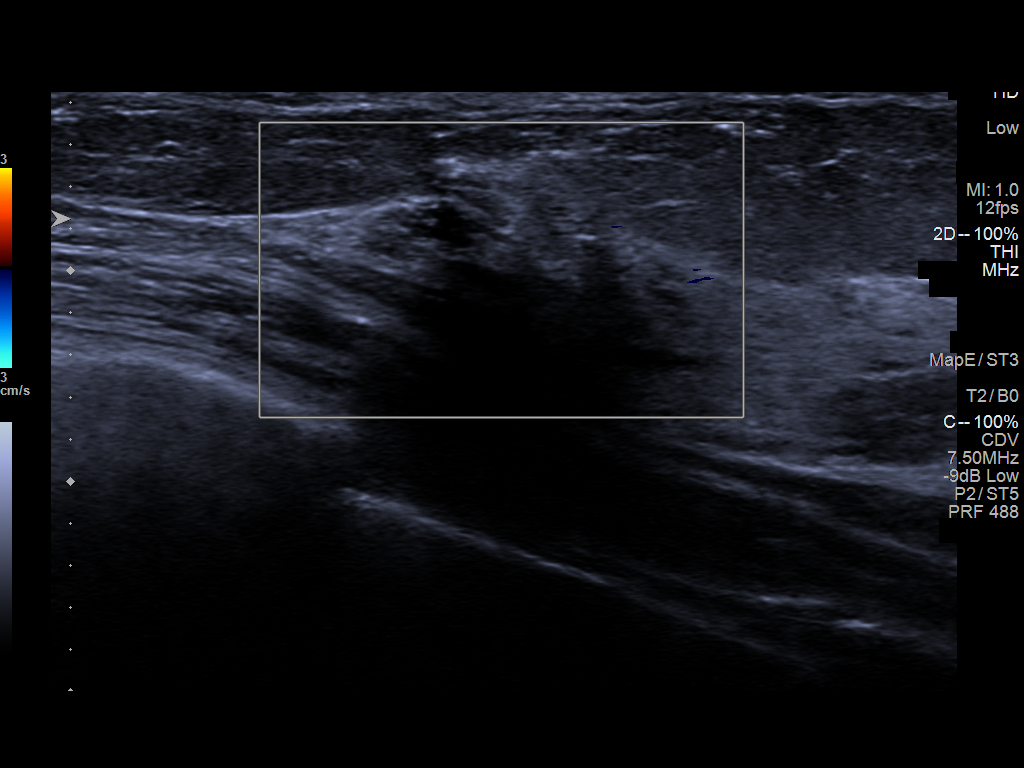

[5 of 5 positions shown; findings below may reference images not displayed]

ACR Breast Density Category c: The breast tissue is heterogeneously
dense, which may obscure small masses.
FINDINGS: Spot compression tomosynthesis images through the upper slightly
outer far posterior left breast demonstrates a persistent asymmetry
which may represent normal fibroglandular tissue. An obscured mass
cannot be excluded due to the patient's dense breast tissue, and
therefore ultrasound will be performed for further evaluation.

Mammographic images were processed with CAD.

Ultrasound targeted to the left breast at 1 o'clock, 5 cm from the
nipple demonstrates a small oval hypoechoic mass measuring 4 x 2 x 3
mm. No blood flow seen within the mass on color Doppler imaging.
IMPRESSION: There is an asymmetry in the upper-outer left breast, which may
correspond with the small mass seen sonographically. This is favored
to represent fibrocystic change, and is likely benign.

RECOMMENDATION:
Six-month follow-up left breast mammogram and ultrasound.

I have discussed the findings and recommendations with the patient.
If applicable, a reminder letter will be sent to the patient
regarding the next appointment.

BI-RADS CATEGORY  3: Probably benign.

## 2020-08-05 ENCOUNTER — Emergency Department
Admission: EM | Admit: 2020-08-05 | Discharge: 2020-08-05 | Disposition: A | Discharge status: Home / Self Care | Payer: Managed Care, Other (non HMO) | Attending: Emergency Medicine | Admitting: Emergency Medicine

## 2020-08-05 ENCOUNTER — Emergency Department: Payer: Managed Care, Other (non HMO)

## 2020-08-05 ENCOUNTER — Telehealth: Payer: Self-pay

## 2020-08-05 ENCOUNTER — Other Ambulatory Visit: Payer: Self-pay

## 2020-08-05 DIAGNOSIS — F17210 Nicotine dependence, cigarettes, uncomplicated: Secondary | ICD-10-CM | POA: Diagnosis not present

## 2020-08-05 DIAGNOSIS — R1011 Right upper quadrant pain: Secondary | ICD-10-CM | POA: Insufficient documentation

## 2020-08-05 DIAGNOSIS — K824 Cholesterolosis of gallbladder: Secondary | ICD-10-CM | POA: Diagnosis not present

## 2020-08-05 DIAGNOSIS — R0789 Other chest pain: Secondary | ICD-10-CM

## 2020-08-05 LAB — COMPREHENSIVE METABOLIC PANEL
ALT: 12 U/L (ref 0–44)
AST: 16 U/L (ref 15–41)
Albumin: 4.4 g/dL (ref 3.5–5.0)
Alkaline Phosphatase: 30 U/L — ABNORMAL LOW (ref 38–126)
Anion gap: 9 (ref 5–15)
BUN: 14 mg/dL (ref 6–20)
CO2: 23 mmol/L (ref 22–32)
Calcium: 9.2 mg/dL (ref 8.9–10.3)
Chloride: 106 mmol/L (ref 98–111)
Creatinine, Ser: 1.02 mg/dL — ABNORMAL HIGH (ref 0.44–1.00)
GFR, Estimated: >60 mL/min (ref >60)
Glucose, Bld: 74 mg/dL (ref 70–99)
Potassium: 4.2 mmol/L (ref 3.5–5.1)
Sodium: 138 mmol/L (ref 135–145)
Total Bilirubin: 0.9 mg/dL (ref 0.3–1.2)
Total Protein: 7 g/dL (ref 6.5–8.1)

## 2020-08-05 LAB — CBC
HCT: 39.8 % (ref 36.0–46.0)
Hemoglobin: 13.8 g/dL (ref 12.0–15.0)
MCH: 32.9 pg (ref 26.0–34.0)
MCHC: 34.7 g/dL (ref 30.0–36.0)
MCV: 94.8 fL (ref 80.0–100.0)
Platelets: 215 10*3/uL (ref 150–400)
RBC: 4.2 MIL/uL (ref 3.87–5.11)
RDW: 12 % (ref 11.5–15.5)
WBC: 6.2 10*3/uL (ref 4.0–10.5)
nRBC: 0 % (ref 0.0–0.2)

## 2020-08-05 LAB — LIPASE, BLOOD: Lipase: 38 U/L (ref 11–51)

## 2020-08-05 LAB — TROPONIN I (HIGH SENSITIVITY): Troponin I (High Sensitivity): 6 ng/L (ref <18)

## 2020-08-05 LAB — POCT PREGNANCY, URINE: Preg Test, Ur: NEGATIVE

## 2020-08-05 MED ORDER — DICYCLOMINE HCL 10 MG PO CAPS: 10.0000 mg | ORAL_CAPSULE | Freq: Three times a day (TID) | ORAL | 0 refills | Status: DC

## 2020-08-05 MED ORDER — FAMOTIDINE 20 MG PO TABS: 20.0000 mg | ORAL_TABLET | Freq: Two times a day (BID) | ORAL | 1 refills | Status: DC

## 2020-08-05 NOTE — Telephone Encounter (Signed)
Spoke to pt and per Ladona Ridgel I informed pt that her kidney function had improved and we will monitor it.  dbs

## 2020-08-05 NOTE — ED Provider Notes (Signed)
[facility] Emergency Department Provider Note   ____________________________________________   First MD Initiated Contact with Patient 08/05/20 1653     (approximate)  I have reviewed the triage vital signs and the nursing notes.   HISTORY  Chief Complaint Chest Pain    HPI Barbara [name] is a 59 y.o. female with a stated past medical history of migraines and anxiety who presents for right upper quadrant abdominal pain that has been present for the last 5 days and waxing and waning in intensity.  Patient states that this right upper quadrant pain radiates up into her central chest and is a burning pain that she describes as 7/10 and gets moderately better if she avoids food.  Patient denies any other exacerbating or relieving factors.  Patient has tried Tums for this pain with minimal relief.  Patient states that when she transition to "soft foods" that were applesauce and mashed potatoes her symptoms improved but did not go away.  Patient also states that after this pain began 5 days ago they initially improved over 3 days and then worsened over the last 24 hours with prompted her emergency department visit.  Patient denies ever having similar symptoms in the past.         Past Medical History:  Diagnosis Date  . Allergic rhinitis   . Anemia   . Anxiety   . Migraines     Patient Active Problem List   Diagnosis Date Noted  . Urinary tract infection without hematuria 11/18/2019  . Encounter for general adult medical examination with abnormal findings 09/29/2019  . Bilateral carotid artery stenosis 09/29/2019  . Acute non-recurrent pansinusitis 06/15/2019  . Close exposure to COVID-19 virus 06/15/2019  . Screening for breast cancer 09/25/2018  . Persistent migraine aura without cerebral infarction and without status migrainosus, not intractable 09/25/2018  . Muscle spasms of neck 09/25/2018  . Cough 09/25/2018  . Cigarette nicotine dependence  without complication 09/25/2018  . Bilateral carotid bruits 09/25/2018  . Primary generalized hypertrophic osteoarthrosis 09/25/2018  . Depression, major, single episode, in partial remission (HCC) 09/25/2018  . Estrogen deficiency 09/25/2018  . Other fatigue 09/25/2018    Past Surgical History:  Procedure Laterality Date  . ABDOMINAL HYSTERECTOMY    . BREAST BIOPSY Left 2014   neg  . CHOLECYSTECTOMY    . cyst removed from neck    . FOOT SURGERY Right    2009  . NASAL SEPTUM SURGERY      Prior to Admission medications   Medication Sig Start Date End Date Taking? Authorizing Provider  atorvastatin (LIPITOR) 10 MG tablet Take 1 tablet (10 mg total) by mouth daily. 05/26/20   Lyndon Code, MD  carisoprodol (SOMA) 350 MG tablet Take 1 tablet (350 mg total) by mouth at bedtime. 05/26/20   Johnna Acosta, NP  celecoxib (CELEBREX) 200 MG capsule Take 1 capsule (200 mg total) by mouth daily. 09/29/19   Carlean Jews, NP  dicyclomine (BENTYL) 10 MG capsule Take 1 capsule (10 mg total) by mouth 4 (four) times daily -  before meals and at bedtime for 14 days. 08/05/20 08/19/20  Merwyn Katos, MD  escitalopram (LEXAPRO) 20 MG tablet Take 1/2 tab po once a day and increase to 1 tab daily as directed 09/29/19   Carlean Jews, NP  estradiol (ESTRACE) 1 MG tablet TAKE 1 TABLET BY MOUTH  DAILY 05/26/20   Lyndon Code, MD  famotidine (PEPCID) 20 MG tablet Take  1 tablet (20 mg total) by mouth 2 (two) times daily. 08/05/20 08/05/21  Merwyn Katos, MD  topiramate (TOPAMAX) 50 MG tablet Take 1 tablet (50 mg total) by mouth daily. 05/26/20   Carlean Jews, NP    Allergies Patient has no known allergies.  Family History  Adopted: Yes    Social History Social History   Tobacco Use  . Smoking status: Current Every Day Smoker    Types: Cigarettes  . Smokeless tobacco: Never Used  . Tobacco comment: 8-10 a day  Substance Use Topics  . Alcohol use: Never  . Drug use: Never    Review  of Systems Constitutional: No fever/chills Eyes: No visual changes. ENT: No sore throat. Cardiovascular: Denies chest pain. Respiratory: Denies shortness of breath. Gastrointestinal: Positive abdominal pain.  No nausea, no vomiting.  No diarrhea. Genitourinary: Negative for dysuria. Musculoskeletal: Negative for acute arthralgias Skin: Negative for rash. Neurological: Negative for headaches, weakness/numbness/paresthesias in any extremity Psychiatric: Negative for suicidal ideation/homicidal ideation   ____________________________________________   PHYSICAL EXAM:  VITAL SIGNS: ED Triage Vitals [08/05/20 1254]  Enc Vitals Group     BP (!) 114/59     Pulse Rate 65     Resp 18     Temp 98.2 F (36.8 C)     Temp Source Oral     SpO2 98 %     Weight 113 lb (51.3 kg)     Height 5\' 5"  (1.651 m)     Head Circumference      Peak Flow      Pain Score 4     Pain Loc      Pain Edu?      Excl. in GC?    Constitutional: Alert and oriented. Well appearing and in no acute distress. Eyes: Conjunctivae are normal. PERRL. Head: Atraumatic. Nose: No congestion/rhinnorhea. Mouth/Throat: Mucous membranes are moist. Neck: No stridor Cardiovascular: Grossly normal heart sounds.  Good peripheral circulation. Respiratory: Normal respiratory effort.  No retractions. Gastrointestinal: Mild right upper quadrant tenderness to palpation.  Soft. No distention. Musculoskeletal: No obvious deformities Neurologic:  Normal speech and language. No gross focal neurologic deficits are appreciated. Skin:  Skin is warm and dry. No rash noted. Psychiatric: Mood and affect are normal. Speech and behavior are normal.  ____________________________________________   LABS (all labs ordered are listed, but only abnormal results are displayed)  Labs Reviewed  COMPREHENSIVE METABOLIC PANEL - Abnormal; Notable for the following components:      Result Value   Creatinine, Ser 1.02 (*)    Alkaline  Phosphatase 30 (*)    All other components within normal limits  CBC  LIPASE, BLOOD  POC URINE PREG, ED  POCT PREGNANCY, URINE  TROPONIN I (HIGH SENSITIVITY)  TROPONIN I (HIGH SENSITIVITY)   ____________________________________________  EKG  ED ECG REPORT I, , the attending physician, personally viewed and interpreted this ECG.  Date: 08/05/2020 EKG Time: 1243 Rate: 62 Rhythm: normal sinus rhythm QRS Axis: normal Intervals: normal ST/T Wave abnormalities: normal Narrative Interpretation: no evidence of acute ischemia  ____________________________________________  RADIOLOGY  ED MD interpretation: 2 view x-ray of the chest shows no evidence of acute abnormalities including no pneumonia, pneumothorax, or widened mediastinum  Right upper quadrant ultrasound shows 4 mm gallbladder polyp without gallstones or biliary dilation.  Patient shows no gallbladder wall thickening, pericholecystic free fluid, or other signs of infection or obstruction  Official radiology report(s): DG Chest 2 View  Result Date: 08/05/2020 CLINICAL DATA:  Right-sided chest and upper quadrant pain for 10 days. Smoker. EXAM: CHEST - 2 VIEW COMPARISON:  09/25/2018 FINDINGS: Mild hyperinflation. Midline trachea. Normal heart size. Atherosclerosis in the transverse aorta. No pleural effusion or pneumothorax. Mild diffuse interstitial thickening. No lobar consolidation. IMPRESSION: No acute cardiopulmonary disease. Hyperinflation and mild interstitial thickening, consistent with COPD/chronic bronchitis. Aortic Atherosclerosis (ICD10-I70.0). Electronically Signed   By: Jeronimo Greaves M.D.   On: 08/05/2020 13:35   US Abdomen Limited RUQ (LIVER/GB)  Result Date: 08/05/2020 CLINICAL DATA:  Right upper quadrant pain EXAM: ULTRASOUND ABDOMEN LIMITED RIGHT UPPER QUADRANT COMPARISON:  None. FINDINGS: Gallbladder: 4 mm echogenic focus along the gallbladder wall does not move or shadow consistent with polyp. No  gallstones. No gallbladder wall thickening. Negative sonographic Murphy sign. Common bile duct: Diameter: 3.3 mm. Liver: No focal lesion identified. Within normal limits in parenchymal echogenicity. Portal vein is patent on color Doppler imaging with normal direction of blood flow towards the liver. Other: None. IMPRESSION: 4 mm gallbladder polyp.  No gallstones or biliary dilatation. Electronically Signed   By: Marlan Palau M.D.   On: 08/05/2020 14:38    ____________________________________________   PROCEDURES  Procedure(s) performed (including Critical Care):  .1-3 Lead EKG Interpretation Performed by: Merwyn Katos, MD Authorized by: Merwyn Katos, MD     Interpretation: normal     ECG rate:  69   ECG rate assessment: normal     Rhythm: sinus rhythm     Ectopy: none     Conduction: normal       ____________________________________________   INITIAL IMPRESSION / ASSESSMENT AND PLAN / ED COURSE  As part of my medical decision making, I reviewed the following data within the electronic MEDICAL RECORD NUMBER Nursing notes reviewed and incorporated, Labs reviewed, EKG interpreted, Old chart reviewed, Radiograph reviewed and Notes from prior ED visits reviewed and incorporated        Presentation most consistent with biliary colic, without evidence of infection. History and exam AAA, pancreatitis, SBO, appendicitis, mesenteric ischemia, nephrolithiasis, pyelonephritis, or diverticulitis.  ED Interventions: analgesia PRN ED Workup: CBC, BMP, LFTs, Lipase, Abdominal US Right upper quadrant ultrasound showing a 4 mm gallbladder polyp Disposition: Refer to general surgery outpatient. Discharge home with SRP and instructions for primary care follow up within 24-48 hours.      ____________________________________________   FINAL CLINICAL IMPRESSION(S) / ED DIAGNOSES  Final diagnoses:  RUQ pain  Atypical chest pain  Gallbladder polyp     ED Discharge Orders          Ordered    famotidine (PEPCID) 20 MG tablet  2 times daily        08/05/20 1705    dicyclomine (BENTYL) 10 MG capsule  3 times daily before meals & bedtime        08/05/20 1705           Note:  This document was prepared using Dragon voice recognition software and may include unintentional dictation errors.   Merwyn Katos, MD 08/05/20 [id number]

## 2020-08-05 NOTE — ED Triage Notes (Signed)
Reports right sided CP and RUQ pain over last 10 days worsening after eating. Pt alert and oriented X4, cooperative, RR even and unlabored, color WNL. Pt in NAD.

## 2020-08-08 ENCOUNTER — Ambulatory Visit: Payer: Managed Care, Other (non HMO) | Admitting: Nurse Practitioner

## 2020-09-13 ENCOUNTER — Other Ambulatory Visit: Payer: Self-pay

## 2020-09-13 DIAGNOSIS — M150 Primary generalized (osteo)arthritis: Secondary | ICD-10-CM

## 2020-09-13 DIAGNOSIS — F324 Major depressive disorder, single episode, in partial remission: Secondary | ICD-10-CM

## 2020-09-13 MED ORDER — CELECOXIB 200 MG PO CAPS: 200.0000 mg | ORAL_CAPSULE | Freq: Every day | ORAL | 0 refills | Status: DC

## 2020-09-13 MED ORDER — ESCITALOPRAM OXALATE 20 MG PO TABS: ORAL_TABLET | ORAL | 0 refills | Status: DC

## 2020-10-06 ENCOUNTER — Encounter: Payer: Self-pay | Admitting: Nurse Practitioner

## 2020-10-06 ENCOUNTER — Ambulatory Visit (INDEPENDENT_AMBULATORY_CARE_PROVIDER_SITE_OTHER): Payer: Managed Care, Other (non HMO) | Admitting: Nurse Practitioner

## 2020-10-06 ENCOUNTER — Other Ambulatory Visit: Payer: Self-pay

## 2020-10-06 VITALS — BP 134/70 | HR 85 | Temp 97.3°F | Resp 16 | Ht 66.0 in | Wt 114.2 lb

## 2020-10-06 DIAGNOSIS — G43509 Persistent migraine aura without cerebral infarction, not intractable, without status migrainosus: Secondary | ICD-10-CM | POA: Diagnosis not present

## 2020-10-06 DIAGNOSIS — Z0001 Encounter for general adult medical examination with abnormal findings: Secondary | ICD-10-CM | POA: Diagnosis not present

## 2020-10-06 DIAGNOSIS — M62838 Other muscle spasm: Secondary | ICD-10-CM | POA: Diagnosis not present

## 2020-10-06 DIAGNOSIS — F324 Major depressive disorder, single episode, in partial remission: Secondary | ICD-10-CM

## 2020-10-06 DIAGNOSIS — Z1231 Encounter for screening mammogram for malignant neoplasm of breast: Secondary | ICD-10-CM

## 2020-10-06 DIAGNOSIS — I6523 Occlusion and stenosis of bilateral carotid arteries: Secondary | ICD-10-CM

## 2020-10-06 DIAGNOSIS — R300 Dysuria: Secondary | ICD-10-CM

## 2020-10-06 MED ORDER — TOPIRAMATE 50 MG PO TABS: 50.0000 mg | ORAL_TABLET | Freq: Every day | ORAL | 1 refills | Status: DC

## 2020-10-06 MED ORDER — CARISOPRODOL 350 MG PO TABS: 350.0000 mg | ORAL_TABLET | Freq: Every day | ORAL | 0 refills | Status: DC

## 2020-10-06 MED ORDER — ESCITALOPRAM OXALATE 20 MG PO TABS: ORAL_TABLET | ORAL | 1 refills | Status: DC

## 2020-10-06 NOTE — Progress Notes (Signed)
[facility] [address] [place], [place] [id number]  Internal MEDICINE  Office Visit Note  Patient Name: Barbara [name]  [id number] 06, 2062  [id number]  Date of Service: 11/07/2020  Pt is here for routine health maintenance examination  Chief Complaint  Patient presents with  . Annual Exam    Strained left shoulder, gallbladder polyp   . Anxiety  . Anemia     Patient is here for health maintenance exam.  -had developed some pain in right upper quadrant of the abdomen. Did have to go to ER for this. Was diagnosed with gallbladder polyp. Was given medication to help slop cramping and given the number for surgeon in case symptoms started again. She states this has resolved and has not come back.  -left shoulder pain after picking up something heavy. Pulled her left arm "out of socket." is now seeing orthopedics. Just had MRI done per orthopedics. Was told she had a great deal of inflammation and bursitis. Did not have tear of her rotator cuff. Was given cortisone injection which has helped some. Trying to rest it, however, she does continue to use the arm. Using muscle relaxer prescribed by this office. Using heat and cold, alternatively. Taking tylenol and prescription anti-inflammatory. She will be doing physical therapy in near future.  -patient is scheduled to have repeat carotid doppler 10/26/2019. Had been started on atorvastatin after her last carotid doppler. She developed some abdominal pain after that and does not wish to take statin any longer.     Current Medication: Outpatient Encounter Medications as of 10/06/2020  Medication Sig  . celecoxib (CELEBREX) 200 MG capsule Take 1 capsule (200 mg total) by mouth daily.  Marland Kitchen. estradiol (ESTRACE) 1 MG tablet TAKE 1 TABLET BY MOUTH  DAILY  . famotidine (PEPCID) 20 MG tablet Take 1 tablet (20 mg total) by mouth 2 (two) times daily.  . [DISCONTINUED] atorvastatin (LIPITOR) 10 MG tablet Take 1 tablet (10 mg total) by mouth daily.   . [DISCONTINUED] carisoprodol (SOMA) 350 MG tablet Take 1 tablet (350 mg total) by mouth at bedtime.  . [DISCONTINUED] escitalopram (LEXAPRO) 20 MG tablet Take 1/2 tab po once a day and increase to 1 tab daily as directed  . [DISCONTINUED] topiramate (TOPAMAX) 50 MG tablet Take 1 tablet (50 mg total) by mouth daily.  . carisoprodol (SOMA) 350 MG tablet Take 1 tablet (350 mg total) by mouth at bedtime.  . dicyclomine (BENTYL) 10 MG capsule Take 1 capsule (10 mg total) by mouth 4 (four) times daily -  before meals and at bedtime for 14 days.  Marland Kitchen. escitalopram (LEXAPRO) 20 MG tablet Take 1/2 tab po once a day and increase to 1 tab daily as directed  . topiramate (TOPAMAX) 50 MG tablet Take 1 tablet (50 mg total) by mouth daily.   No facility-administered encounter medications on file as of 10/06/2020.    Surgical History: Past Surgical History:  Procedure Laterality Date  . ABDOMINAL HYSTERECTOMY    . BREAST BIOPSY Left 2014   neg  . FOOT SURGERY Right    2009  . LAPAROSCOPIC APPENDECTOMY  12/05/2012  . [facility]   Thyroglossal duct cyst excision    Medical History: Past Medical History:  Diagnosis Date  . Allergic rhinitis   . Anemia   . Anxiety   . Gallbladder polyp   . Migraines     Family History: Family History  Adopted: Yes  Review of Systems  Constitutional: Negative for activity change, chills, fatigue and unexpected weight change.  HENT: Negative for congestion, postnasal drip, rhinorrhea, sneezing and sore throat.   Respiratory: Negative for cough, chest tightness, shortness of breath and wheezing.   Cardiovascular: Negative for chest pain and palpitations.  Gastrointestinal: Negative for abdominal pain, constipation, diarrhea, nausea and vomiting.       Had single episode of right upper quadrant pain. Hs been diagnosed with 19mm gallbladder polyp. Pain has resolved. Will monitor closely.   Endocrine: Negative  for cold intolerance, heat intolerance, polydipsia and polyuria.  Genitourinary: Negative for dysuria, frequency and urgency.  Musculoskeletal: Negative for arthralgias, back pain, joint swelling and neck pain.  Skin: Negative for rash.  Allergic/Immunologic: Negative for environmental allergies.  Neurological: Negative for dizziness, tremors, numbness and headaches.  Hematological: Negative for adenopathy. Does not bruise/bleed easily.  Psychiatric/Behavioral: Negative for behavioral problems (Depression), sleep disturbance and suicidal ideas. The patient is not nervous/anxious.      Today's Vitals   10/06/20 1423  BP: 134/70  Pulse: 85  Resp: 16  Temp: (!) 97.3 F (36.3 C)  SpO2: 98%  Weight: 114 lb 3.2 oz (51.8 kg)  Height: 5\' 6"  (1.676 m)   Body mass index is 18.43 kg/m.  Physical Exam Vitals and nursing note reviewed.  Constitutional:      General: She is not in acute distress.    Appearance: Normal appearance. She is well-developed and well-nourished. She is not diaphoretic.  HENT:     Head: Normocephalic and atraumatic.     Nose: Nose normal.     Mouth/Throat:     Mouth: Oropharynx is clear and moist.     Pharynx: No oropharyngeal exudate.  Eyes:     Extraocular Movements: EOM normal.     Pupils: Pupils are equal, round, and reactive to light.  Neck:     Thyroid: No thyromegaly.     Vascular: No carotid bruit or JVD.     Trachea: No tracheal deviation.  Cardiovascular:     Rate and Rhythm: Normal rate and regular rhythm.     Pulses: Normal pulses.     Heart sounds: Normal heart sounds. No murmur heard. No friction rub. No gallop.   Pulmonary:     Effort: Pulmonary effort is normal. No respiratory distress.     Breath sounds: Normal breath sounds. No wheezing or rales.  Chest:     Chest wall: No tenderness.  Breasts:     Right: Normal. No swelling, bleeding, inverted nipple, mass, nipple discharge, skin change, tenderness or axillary adenopathy.     Left:  Normal. No swelling, bleeding, inverted nipple, mass, nipple discharge, skin change, tenderness or axillary adenopathy.    Abdominal:     General: Bowel sounds are normal.     Palpations: Abdomen is soft.     Tenderness: There is no abdominal tenderness.  Musculoskeletal:        General: Normal range of motion.     Cervical back: Normal range of motion and neck supple.  Lymphadenopathy:     Cervical: No cervical adenopathy.     Upper Body:     Right upper body: No axillary adenopathy.     Left upper body: No axillary adenopathy.  Skin:    General: Skin is warm and dry.     Capillary Refill: Capillary refill takes less than 2 seconds.  Neurological:     General: No focal deficit present.     Mental Status: She  is alert and oriented to person, place, and time.     Cranial Nerves: No cranial nerve deficit.  Psychiatric:        Mood and Affect: Mood and affect and mood normal.        Behavior: Behavior normal.        Thought Content: Thought content normal.        Judgment: Judgment normal.      LABS: Recent Results (from the past 2160 hour(s))  UA/M w/rflx Culture, Routine     Status: Abnormal   Collection Time: 10/06/20  2:23 PM   Specimen: Urine   Urine  Result Value Ref Range   Specific Gravity, UA 1.022 1.005 - 1.030   pH, UA 5.5 5.0 - 7.5   Color, UA Yellow Yellow   Appearance Ur Cloudy (A) Clear   Leukocytes,UA 3+ (A) Negative   Protein,UA Negative Negative/Trace   Glucose, UA Negative Negative   Ketones, UA Negative Negative   RBC, UA Negative Negative   Bilirubin, UA Negative Negative   Urobilinogen, Ur 0.2 0.2 - 1.0 mg/dL   Nitrite, UA Positive (A) Negative   Microscopic Examination See below:     Comment: Microscopic was indicated and was performed.   Urinalysis Reflex Comment     Comment: This specimen has reflexed to a Urine Culture.  Microscopic Examination     Status: Abnormal   Collection Time: 10/06/20  2:23 PM   Urine  Result Value Ref Range    WBC, UA 0-5 0 - 5 /hpf   RBC 0-2 0 - 2 /hpf   Epithelial Cells (non renal) 0-10 0 - 10 /hpf   Casts None seen None seen /lpf   Bacteria, UA Many (A) None seen/Few  Urine Culture, Reflex     Status: Abnormal   Collection Time: 10/06/20  2:23 PM   Urine  Result Value Ref Range   Urine Culture, Routine Final report (A)    Organism ID, Bacteria Escherichia coli (A)     Comment: Cefazolin <=4 ug/mL Cefazolin with an MIC <=16 predicts susceptibility to the oral agents cefaclor, cefdinir, cefpodoxime, cefprozil, cefuroxime, cephalexin, and loracarbef when used for therapy of uncomplicated urinary tract infections due to E. coli, Klebsiella pneumoniae, and Proteus mirabilis. Greater than 100,000 colony forming units per mL    Antimicrobial Susceptibility Comment     Comment:       ** S = Susceptible; I = Intermediate; R = Resistant **                    P = Positive; N = Negative             MICS are expressed in micrograms per mL    Antibiotic                 RSLT#1    RSLT#2    RSLT#3    RSLT#4 Amoxicillin/Clavulanic Acid    S Ampicillin                     S Cefepime                       S Ceftriaxone                    S Cefuroxime                     S Ciprofloxacin  S Ertapenem                      S Gentamicin                     S Imipenem                       S Levofloxacin                   S Meropenem                      S Nitrofurantoin                 S Piperacillin/Tazobactam        S Tetracycline                   S Tobramycin                     S Trimethoprim/Sulfa             S     Assessment/Plan: 1. Encounter for general adult medical examination with abnormal findings Annual health maintenance exam today.   2. Bilateral carotid artery stenosis Patient scheduled to have carotid doppler study on 10/25/2020. Has been unable to tolerate statins and is no longer taking them.   3. Persistent migraine aura without cerebral infarction and  without status migrainosus, not intractable Stable with topamax 50mg  every evening. contineu to take every evening. Refills provided.  - topiramate (TOPAMAX) 50 MG tablet; Take 1 tablet (50 mg total) by mouth daily.  Dispense: 90 tablet; Refill: 1  4. Muscle spasms of neck Takes Soma 350mg  when needed for neck pain and tension which will sometimes lead to migraine headaches.  - carisoprodol (SOMA) 350 MG tablet; Take 1 tablet (350 mg total) by mouth at bedtime.  Dispense: 90 tablet; Refill: 0  5. Depression, major, single episode, in partial remission (HCC) Continue lexapro daily. Refills provided.  - escitalopram (LEXAPRO) 20 MG tablet; Take 1/2 tab po once a day and increase to 1 tab daily as directed  Dispense: 90 tablet; Refill: 1  6. Encounter for screening mammogram for malignant neoplasm of breast - MM DIGITAL SCREENING BILATERAL; Future  7. Dysuria - UA/M w/rflx Culture, Routine  General Counseling: Soleia verbalizes understanding of the findings of todays visit and agrees with plan of treatment. I have discussed any further diagnostic evaluation that may be needed or ordered today. We also reviewed her medications today. she has been encouraged to call the office with any questions or concerns that should arise related to todays visit.    Counseling:  This patient was seen by FNP Collaboration with Dr as a part of collaborative care agreement  Orders Placed This Encounter  Procedures  . Microscopic Examination  . Urine Culture, Reflex  . MM DIGITAL SCREENING BILATERAL  . UA/M w/rflx Culture, Routine    Meds ordered this encounter  Medications  . escitalopram (LEXAPRO) 20 MG tablet    Sig: Take 1/2 tab po once a day and increase to 1 tab daily as directed    Dispense:  90 tablet    Refill:  1    Order Specific Question:   Supervising Provider    Answer:   Vincent Gros [[id number]]  . topiramate (TOPAMAX) 50 MG tablet    Sig:  Take 1 tablet (50  mg total) by mouth daily.    Dispense:  90 tablet    Refill:  1    Order Specific Question:   Supervising Provider    Answer:   Lyndon Code [[id number]]  . carisoprodol (SOMA) 350 MG tablet    Sig: Take 1 tablet (350 mg total) by mouth at bedtime.    Dispense:  90 tablet    Refill:  0    Order Specific Question:   Supervising Provider    Answer:   Lyndon Code [[id number]]    Total time spent: 45 Minutes  Time spent includes review of chart, medications, test results, and follow up plan with the patient.     Lyndon Code, MD  Internal Medicine

## 2020-10-07 ENCOUNTER — Other Ambulatory Visit: Payer: Self-pay | Admitting: Nurse Practitioner

## 2020-10-07 ENCOUNTER — Telehealth: Payer: Self-pay

## 2020-10-07 DIAGNOSIS — N390 Urinary tract infection, site not specified: Secondary | ICD-10-CM

## 2020-10-07 MED ORDER — NITROFURANTOIN MONOHYD MACRO 100 MG PO CAPS: 100.0000 mg | ORAL_CAPSULE | Freq: Two times a day (BID) | ORAL | 0 refills | Status: DC

## 2020-10-07 NOTE — Telephone Encounter (Signed)
Spoke with pt, informed her she had UTI at the time of her visit per Herbert Seta, and that Macrobid was sent to her pharmacy.

## 2020-10-07 NOTE — Telephone Encounter (Signed)
-----   Message from Carlean Jews, NP sent at 10/07/2020  8:48 AM EST ----- Please let the patient know that she had uti at time of her visit. I have added macrobid which is taken 2 times daily for next 10 days. I sent this to her pharmacy. Thanks

## 2020-10-07 NOTE — Progress Notes (Signed)
Please let the patient know that she had uti at time of her visit. I have added macrobid which is taken 2 times daily for next 10 days. I sent this to her pharmacy. Thanks

## 2020-10-12 LAB — UA/M W/RFLX CULTURE, ROUTINE
Bilirubin, UA: NEGATIVE
Glucose, UA: NEGATIVE
Ketones, UA: NEGATIVE
Nitrite, UA: POSITIVE — AB
Protein,UA: NEGATIVE
RBC, UA: NEGATIVE
Specific Gravity, UA: 1.022 (ref 1.005–1.030)
Urobilinogen, Ur: 0.2 mg/dL (ref 0.2–1.0)
pH, UA: 5.5 (ref 5.0–7.5)

## 2020-10-12 LAB — MICROSCOPIC EXAMINATION: Casts: NONE SEEN /lpf

## 2020-10-12 LAB — URINE CULTURE, REFLEX

## 2020-10-26 ENCOUNTER — Other Ambulatory Visit: Payer: Self-pay

## 2020-10-26 ENCOUNTER — Ambulatory Visit: Payer: Managed Care, Other (non HMO)

## 2020-10-26 DIAGNOSIS — I6523 Occlusion and stenosis of bilateral carotid arteries: Secondary | ICD-10-CM | POA: Diagnosis not present

## 2020-10-28 ENCOUNTER — Other Ambulatory Visit: Payer: Managed Care, Other (non HMO)

## 2020-11-07 ENCOUNTER — Ambulatory Visit: Payer: Managed Care, Other (non HMO) | Admitting: Internal Medicine

## 2020-11-07 DIAGNOSIS — R300 Dysuria: Secondary | ICD-10-CM | POA: Insufficient documentation

## 2020-11-07 NOTE — Procedures (Signed)
[facility] [address] [place], [place] [address]  DATE OF SERVICE: October 26, 2020  CAROTID DOPPLER INTERPRETATION:  Bilateral Carotid Ultrsasound and Color Doppler Examination was performed. The RIGHT CCA shows mild plaque in the vessel. The LEFT CCA shows mild to moderate plaque in the vessel. There was no significant intimal thickening noted in the RIGHT carotid artery. There was no significant intimal thickening in the LEFT carotid artery.  The RIGHT CCA shows peak systolic velocity of 87 cm per second. The end diastolic velocity is 20 cm per second on the RIGHT side. The RIGHT ICA shows peak systolic velocity of 70 per second. RIGHT sided ICA end diastolic velocity is 13 cm per second. The RIGHT ECA shows a peak systolic velocity of 78 cm per second. The ICA/CCA ratio is calculated to be 0.8. This suggests less than 50% stenosis. The Vertebral Artery shows antegrade flow.  The LEFT CCA shows peak systolic velocity of 85 cm per second. The end diastolic velocity is 20 cm per second on the LEFT side. The LEFT ICA shows peak systolic velocity of 70 per second. LEFT sided ICA end diastolic velocity is 23 cm per second. The LEFT ECA shows a peak systolic velocity of 133 cm per second. The ICA/CCA ratio is calculated to be 0.82. This suggests less than 50% stenosis. The Vertebral Artery shows antegrade flow.   Impression:    The RIGHT CAROTID shows less than 50% stenosis. The LEFT CAROTID shows less than 50% stenosis.  There is mild to moderate plaque formation noted on the LEFT and mild plaque on the RIGHT  side. Consider a repeat Carotid doppler if clinical situation and symptoms warrant in 6-12 months. Patient should be encouraged to change lifestyles such as smoking cessation, regular exercise and dietary modification. Use of statins in the right clinical setting and ASA is encouraged.  Yevonne Pax, MD [facility] Pulmonary Critical Care Medicine

## 2020-11-25 ENCOUNTER — Encounter: Payer: Self-pay | Admitting: Physician Assistant

## 2020-11-25 ENCOUNTER — Ambulatory Visit: Payer: Managed Care, Other (non HMO) | Admitting: Physician Assistant

## 2020-11-25 VITALS — HR 85 | Temp 98.5°F | Ht 65.0 in | Wt 114.0 lb

## 2020-11-25 DIAGNOSIS — Z20822 Contact with and (suspected) exposure to covid-19: Secondary | ICD-10-CM | POA: Diagnosis not present

## 2020-11-25 DIAGNOSIS — I6523 Occlusion and stenosis of bilateral carotid arteries: Secondary | ICD-10-CM

## 2020-11-25 MED ORDER — ICOSAPENT ETHYL 1 G PO CAPS: ORAL_CAPSULE | ORAL | 0 refills | Status: DC

## 2020-11-25 MED ORDER — ICOSAPENT ETHYL 1 G PO CAPS: ORAL_CAPSULE | ORAL | 1 refills | Status: DC

## 2020-11-25 NOTE — Progress Notes (Addendum)
[facility] [address] [place], [place] [address]  Internal MEDICINE  Telephone Visit  Patient Name: Barbara [name]  [id number]  [id number]  Date of Service: 11/25/2020  I connected with the patient at 11:30 by telephone and verified the patients identity using two identifiers.   I discussed the limitations, risks, security and privacy concerns of performing an evaluation and management service by telephone and the availability of in person appointments. I also discussed with the patient that there may be a patient responsible charge related to the service.  The patient expressed understanding and agrees to proceed.    Chief Complaint  Patient presents with   Telephone Assessment    Exposure to covid    Telephone Screen    [phone]   Follow-up    U/S follow up    Anxiety    HPI Pt reports today for f/u on [place] results. Her husband is currently covid positive, but she is asymptomatic. Carotid US showed <50% stenosis with mild-mod plaque bilaterally.  She has previously tried a statin and stopped due to ending up in emergency room with CP. They did a cardiac work up but everything was fine except a polyp found on gallbladder. She associates this event with statin use and is not willing to try it again. She is very hesitant to take any medications and does not want to change or increase any medications, she would rather get rid of medications if possible.  Current Medication: Outpatient Encounter Medications as of 11/25/2020  Medication Sig   carisoprodol (SOMA) 350 MG tablet Take 1 tablet (350 mg total) by mouth at bedtime.   celecoxib (CELEBREX) 200 MG capsule Take 1 capsule (200 mg total) by mouth daily.   escitalopram (LEXAPRO) 20 MG tablet Take 1/2 tab po once a day and increase to 1 tab daily as directed   estradiol (ESTRACE) 1 MG tablet TAKE 1 TABLET BY MOUTH  DAILY   famotidine (PEPCID) 20 MG tablet Take 1 tablet (20 mg total) by mouth 2 (two) times daily.    nitrofurantoin, macrocrystal-monohydrate, (MACROBID) 100 MG capsule Take 1 capsule (100 mg total) by mouth 2 (two) times daily.   topiramate (TOPAMAX) 50 MG tablet Take 1 tablet (50 mg total) by mouth daily.   [DISCONTINUED] icosapent Ethyl (VASCEPA) 1 g capsule Take 1 capsule twice per day.   dicyclomine (BENTYL) 10 MG capsule Take 1 capsule (10 mg total) by mouth 4 (four) times daily -  before meals and at bedtime for 14 days.   icosapent Ethyl (VASCEPA) 1 g capsule Take 1 capsule twice per day.   No facility-administered encounter medications on file as of 11/25/2020.    Surgical History: Past Surgical History:  Procedure Laterality Date   ABDOMINAL HYSTERECTOMY     BREAST BIOPSY Left 2014   neg   FOOT SURGERY Right    2009   LAPAROSCOPIC APPENDECTOMY  12/05/2012   NASAL SEPTUM SURGERY     SISTRUNK PROCEDURE  03/03/2012   Thyroglossal duct cyst excision    Medical History: Past Medical History:  Diagnosis Date   Allergic rhinitis    Anemia    Anxiety    Gallbladder polyp    Migraines     Family History: Family History  Adopted: Yes    Social History   Socioeconomic History   Marital status: Married    Spouse name: Not on file   Number of children: Not on file   Years of education: Not on file  Highest education level: Not on file  Occupational History   Not on file  Tobacco Use   Smoking status: Current Every Day Smoker    Types: Cigarettes   Smokeless tobacco: Never Used   Tobacco comment: 8-10 a day  Substance and Sexual Activity   Alcohol use: Never   Drug use: Never   Sexual activity: Not on file  Other Topics Concern   Not on file  Social History Narrative   Not on file   Social Determinants of Health   Financial Resource Strain: Not on file  Food Insecurity: Not on file  Transportation Needs: Not on file  Physical Activity: Not on file  Stress: Not on file  Social Connections: Not on file  Intimate Partner Violence: Not on file       Review of Systems  Constitutional: Negative for chills, fatigue and unexpected weight change.  HENT: Negative for congestion, postnasal drip, rhinorrhea, sneezing and sore throat.   Eyes: Negative for redness.  Respiratory: Negative for cough, chest tightness and shortness of breath.   Cardiovascular: Negative for chest pain and palpitations.  Gastrointestinal: Negative for abdominal pain, constipation, diarrhea, nausea and vomiting.  Genitourinary: Negative for dysuria and frequency.  Musculoskeletal: Positive for arthralgias. Negative for back pain, joint swelling and neck pain.  Skin: Negative for rash.  Neurological: Negative.  Negative for tremors and numbness.  Hematological: Negative for adenopathy. Does not bruise/bleed easily.  Psychiatric/Behavioral: Negative for behavioral problems (Depression), sleep disturbance and suicidal ideas. The patient is nervous/anxious.     Vital Signs: Pulse 85   Temp 98.5 F (36.9 C)   Ht 5\' 5"  (1.651 m)   Wt 114 lb (51.7 kg)   SpO2 96%   BMI 18.97 kg/m    Observation/Objective:  Pt is able to carry out conversation appropriately.   Assessment/Plan: 1. Close exposure to COVID-19 virus Her husband might have been exposed to COVID. She denies having any symptoms or being positive herself, but has been in close contact. She will call the office if she starts experiencing symptoms.  2. Carotid stenosis, asymptomatic, bilateral Pt had CP previously after taking a statin and is not open to trying a statin again. Informed her that her carotid showed <50% stenosis with mild-mod plaque formation bilaterally, and she has elevated cholesterol. She is open trying a non-statin cardioprotective medication to help manage her cholesterol/TG and prevent further plaque build up. She will start on Vascepa and consider repeat US in 1 year. - icosapent Ethyl (VASCEPA) 1 g capsule; Take 1 capsule twice per day.  Dispense: 90 capsule; Refill:  1  General Counseling: Cela verbalizes understanding of the findings of today's phone visit and agrees with plan of treatment. I have discussed any further diagnostic evaluation that may be needed or ordered today. We also reviewed her medications today. she has been encouraged to call the office with any questions or concerns that should arise related to todays visit.    No orders of the defined types were placed in this encounter.   Meds ordered this encounter  Medications   DISCONTD: icosapent Ethyl (VASCEPA) 1 g capsule    Sig: Take 1 capsule twice per day.    Dispense:  90 capsule    Refill:  1   icosapent Ethyl (VASCEPA) 1 g capsule    Sig: Take 1 capsule twice per day.    Dispense:  180 capsule    Refill:  0    Time spent:20 Minutes  Dr Lavera Guise Internal medicine

## 2020-12-05 ENCOUNTER — Other Ambulatory Visit: Payer: Self-pay

## 2020-12-05 DIAGNOSIS — M150 Primary generalized (osteo)arthritis: Secondary | ICD-10-CM

## 2020-12-05 MED ORDER — CELECOXIB 200 MG PO CAPS: 200.0000 mg | ORAL_CAPSULE | Freq: Every day | ORAL | 0 refills | Status: DC

## 2020-12-07 ENCOUNTER — Telehealth: Payer: Self-pay

## 2020-12-07 DIAGNOSIS — I6523 Occlusion and stenosis of bilateral carotid arteries: Secondary | ICD-10-CM

## 2020-12-07 NOTE — Telephone Encounter (Signed)
Pt was given prescription for VASCEPA and insurance didn't cover it and sent a prior auth to be done.  Per Dr Welton Flakes she advised for pt to just take OTC fish oil until her next f-up appt.  I called and informed pt of the change

## 2021-01-27 ENCOUNTER — Other Ambulatory Visit: Payer: Self-pay | Admitting: Internal Medicine

## 2021-01-27 ENCOUNTER — Other Ambulatory Visit: Payer: Self-pay | Admitting: Nurse Practitioner

## 2021-01-27 DIAGNOSIS — M150 Primary generalized (osteo)arthritis: Secondary | ICD-10-CM

## 2021-01-27 DIAGNOSIS — G43509 Persistent migraine aura without cerebral infarction, not intractable, without status migrainosus: Secondary | ICD-10-CM

## 2021-01-27 DIAGNOSIS — F324 Major depressive disorder, single episode, in partial remission: Secondary | ICD-10-CM

## 2021-01-27 NOTE — Telephone Encounter (Signed)
Pt needs 6 month f/u. OK to refill

## 2021-01-30 ENCOUNTER — Other Ambulatory Visit: Payer: Self-pay | Admitting: Physician Assistant

## 2021-01-30 DIAGNOSIS — M62838 Other muscle spasm: Secondary | ICD-10-CM

## 2021-01-30 MED ORDER — CARISOPRODOL 350 MG PO TABS: 350.0000 mg | ORAL_TABLET | Freq: Every day | ORAL | 0 refills | Status: DC

## 2021-01-31 ENCOUNTER — Telehealth: Payer: Self-pay

## 2021-01-31 NOTE — Telephone Encounter (Signed)
Spoke with pt she will call us back for appt need in 3 months for her med refills

## 2021-03-08 ENCOUNTER — Other Ambulatory Visit: Payer: Self-pay | Admitting: Nurse Practitioner

## 2021-03-08 DIAGNOSIS — F324 Major depressive disorder, single episode, in partial remission: Secondary | ICD-10-CM

## 2021-05-29 ENCOUNTER — Other Ambulatory Visit: Payer: Self-pay | Admitting: Internal Medicine

## 2021-05-29 DIAGNOSIS — Z1231 Encounter for screening mammogram for malignant neoplasm of breast: Secondary | ICD-10-CM

## 2021-06-07 ENCOUNTER — Other Ambulatory Visit: Payer: Self-pay | Admitting: Internal Medicine

## 2021-06-07 DIAGNOSIS — E2839 Other primary ovarian failure: Secondary | ICD-10-CM

## 2021-06-27 ENCOUNTER — Encounter: Payer: Self-pay | Admitting: Internal Medicine

## 2021-06-27 ENCOUNTER — Ambulatory Visit: Payer: Managed Care, Other (non HMO) | Admitting: Internal Medicine

## 2021-06-27 ENCOUNTER — Other Ambulatory Visit: Payer: Self-pay

## 2021-06-27 DIAGNOSIS — G43009 Migraine without aura, not intractable, without status migrainosus: Secondary | ICD-10-CM

## 2021-06-27 DIAGNOSIS — E2839 Other primary ovarian failure: Secondary | ICD-10-CM | POA: Diagnosis not present

## 2021-06-27 DIAGNOSIS — N390 Urinary tract infection, site not specified: Secondary | ICD-10-CM

## 2021-06-27 DIAGNOSIS — E538 Deficiency of other specified B group vitamins: Secondary | ICD-10-CM

## 2021-06-27 DIAGNOSIS — Z23 Encounter for immunization: Secondary | ICD-10-CM

## 2021-06-27 DIAGNOSIS — F17210 Nicotine dependence, cigarettes, uncomplicated: Secondary | ICD-10-CM

## 2021-06-27 LAB — POCT URINALYSIS DIPSTICK
Bilirubin, UA: NEGATIVE
Glucose, UA: NEGATIVE
Ketones, UA: NEGATIVE
Leukocytes, UA: NEGATIVE
Nitrite, UA: POSITIVE
Protein, UA: NEGATIVE
Spec Grav, UA: 1.01 (ref 1.010–1.025)
Urobilinogen, UA: 0.2 E.U./dL
pH, UA: 6 (ref 5.0–8.0)

## 2021-06-27 MED ORDER — NITROFURANTOIN MONOHYD MACRO 100 MG PO CAPS: ORAL_CAPSULE | ORAL | 0 refills | Status: DC

## 2021-06-27 MED ORDER — CYANOCOBALAMIN 1000 MCG/ML IJ SOLN
1000.0000 ug | Freq: Once | INTRAMUSCULAR | Status: AC
  Administered 2021-06-27: 1000 ug via INTRAMUSCULAR

## 2021-06-27 NOTE — Progress Notes (Signed)
[facility] [address] [place], [place] [address]  Internal MEDICINE  Office Visit Note  Patient Name: Barbara [name]  [id number]  [id number]  Date of Service: 06/27/2021  Chief Complaint  Patient presents with   Follow-up    Discuss meds   Anemia   Anxiety    HPI Patient is seen today to discuss medications. -Patient has been on HRT for a long time.  Dose reduction is indicated  -Patient is also going to quit smoking she has purchased nicotine patches which she will start 21 mcg might need to switch Lexapro to Wellbutrin to help her in smoking cessation  -Patient is due for her BMD -Labs reviewed with her her B12 levels were low last year due to some confusion she has not been on any replacement - C/O dysuria   Current Medication: Outpatient Encounter Medications as of 06/27/2021  Medication Sig   carisoprodol (SOMA) 350 MG tablet Take 1 tablet (350 mg total) by mouth at bedtime.   celecoxib (CELEBREX) 200 MG capsule TAKE 1 CAPSULE BY MOUTH  DAILY   escitalopram (LEXAPRO) 20 MG tablet Take 1/2 tab po once a day and increase to 1 tab daily as directed   estradiol (ESTRACE) 1 MG tablet TAKE 1 TABLET BY MOUTH  DAILY   nitrofurantoin, macrocrystal-monohydrate, (MACROBID) 100 MG capsule Take one tab po bid   topiramate (TOPAMAX) 50 MG tablet Take 1 tablet (50 mg total) by mouth daily.   [DISCONTINUED] dicyclomine (BENTYL) 10 MG capsule Take 1 capsule (10 mg total) by mouth 4 (four) times daily -  before meals and at bedtime for 14 days.   [DISCONTINUED] famotidine (PEPCID) 20 MG tablet Take 1 tablet (20 mg total) by mouth 2 (two) times daily. (Patient not taking: Reported on 06/27/2021)   [DISCONTINUED] nitrofurantoin, macrocrystal-monohydrate, (MACROBID) 100 MG capsule Take 1 capsule (100 mg total) by mouth 2 (two) times daily. (Patient not taking: Reported on 06/27/2021)   Facility-Administered Encounter Medications as of 06/27/2021  Medication   cyanocobalamin  ((VITAMIN B-12)) injection 1,000 mcg    Surgical History: Past Surgical History:  Procedure Laterality Date   ABDOMINAL HYSTERECTOMY     BREAST BIOPSY Left 2014   neg   FOOT SURGERY Right    2009   LAPAROSCOPIC APPENDECTOMY  12/05/2012   NASAL SEPTUM SURGERY     SISTRUNK PROCEDURE  03/03/2012   Thyroglossal duct cyst excision    Medical History: Past Medical History:  Diagnosis Date   Allergic rhinitis    Anemia    Anxiety    Gallbladder polyp    Migraines     Family History: Family History  Adopted: Yes    Social History   Socioeconomic History   Marital status: Married    Spouse name: Not on file   Number of children: Not on file   Years of education: Not on file   Highest education level: Not on file  Occupational History   Not on file  Tobacco Use   Smoking status: Every Day    Types: Cigarettes   Smokeless tobacco: Never   Tobacco comments:    8-10 a day  Substance and Sexual Activity   Alcohol use: Never   Drug use: Never   Sexual activity: Not on file  Other Topics Concern   Not on file  Social History Narrative   Not on file   Social Determinants of Health   Financial Resource Strain: Not on file  Food Insecurity: Not on file  Transportation Needs: Not on file  Physical Activity: Not on file  Stress: Not on file  Social Connections: Not on file  Intimate Partner Violence: Not on file      Review of Systems  Constitutional:  Negative for chills, fatigue and unexpected weight change.  HENT:  Positive for postnasal drip. Negative for congestion, rhinorrhea, sneezing and sore throat.   Eyes:  Negative for redness.  Respiratory:  Negative for cough, chest tightness and shortness of breath.   Cardiovascular:  Negative for chest pain and palpitations.  Gastrointestinal:  Negative for abdominal pain, constipation, diarrhea, nausea and vomiting.  Genitourinary:  Positive for dysuria. Negative for frequency.  Musculoskeletal:  Negative for  arthralgias, back pain, joint swelling and neck pain.  Skin:  Negative for rash.  Neurological: Negative.  Negative for tremors and numbness.  Hematological:  Negative for adenopathy. Does not bruise/bleed easily.  Psychiatric/Behavioral:  Negative for behavioral problems (Depression), sleep disturbance and suicidal ideas. The patient is not nervous/anxious.    Vital Signs: BP 136/72   Pulse 69   Temp (!) 97.3 F (36.3 C)   Resp 16   Ht 5' 5.5" (1.664 m)   Wt 116 lb 12.8 oz (53 kg)   SpO2 99%   BMI 19.14 kg/m    Physical Exam Constitutional:      Appearance: Normal appearance.  HENT:     Head: Normocephalic and atraumatic.     Nose: Nose normal.     Mouth/Throat:     Mouth: Mucous membranes are moist.     Pharynx: No posterior oropharyngeal erythema.  Eyes:     Extraocular Movements: Extraocular movements intact.     Pupils: Pupils are equal, round, and reactive to light.  Cardiovascular:     Pulses: Normal pulses.     Heart sounds: Normal heart sounds.  Pulmonary:     Effort: Pulmonary effort is normal.     Breath sounds: Normal breath sounds.  Neurological:     General: No focal deficit present.     Mental Status: She is alert.  Psychiatric:        Mood and Affect: Mood normal.        Behavior: Behavior normal.       Assessment/Plan: 1. Urinary tract infection without hematuria, site unspecified Pt has positive nitrites on her dipstick, will get C/S add macrobid  - POCT Urinalysis Dipstick - CULTURE, URINE COMPREHENSIVE - nitrofurantoin, macrocrystal-monohydrate, (MACROBID) 100 MG capsule; Take one tab po bid  Dispense: 20 capsule; Refill: 0  2. B12 deficiency Restart B12 - cyanocobalamin ((VITAMIN B-12)) injection 1,000 mcg  3. Other primary ovarian failure Decrease Estrace to 0.5 mg po qd for now  - DG Bone Density; Future  4. Migraine without aura and without status migrainosus, not intractable Stable on Topamax   5. Cigarette nicotine dependence  without complication Pt will start Nicotine patches 21- 14- 7 mcg, might want to switch Lexapro to Wellbutrin ( she is already taking 10 mg of lexapro )   6. Needs flu shot - Flu Vaccine MDCK QUAD PF   General Counseling: Barbara [name] verbalizes understanding of the findings of todays visit and agrees with plan of treatment. I have discussed any further diagnostic evaluation that may be needed or ordered today. We also reviewed her medications today. she has been encouraged to call the office with any questions or concerns that should arise related to todays visit.    Orders Placed This Encounter  Procedures   CULTURE, URINE COMPREHENSIVE  DG Bone Density   Flu Vaccine MDCK QUAD PF   POCT Urinalysis Dipstick    Meds ordered this encounter  Medications   cyanocobalamin ((VITAMIN B-12)) injection 1,000 mcg   nitrofurantoin, macrocrystal-monohydrate, (MACROBID) 100 MG capsule    Sig: Take one tab po bid    Dispense:  20 capsule    Refill:  0    Total time spent:30 Minutes Time spent includes review of chart, medications, test results, and follow up plan with the patient.   [place] Controlled Substance Database was reviewed by me.   Dr Lyndon Code Internal medicine

## 2021-06-30 LAB — CULTURE, URINE COMPREHENSIVE

## 2021-08-08 ENCOUNTER — Ambulatory Visit: Payer: Managed Care, Other (non HMO) | Admitting: Internal Medicine

## 2021-08-10 ENCOUNTER — Encounter: Payer: Self-pay | Admitting: Internal Medicine

## 2021-08-10 ENCOUNTER — Other Ambulatory Visit: Payer: Self-pay

## 2021-08-10 ENCOUNTER — Ambulatory Visit: Payer: Managed Care, Other (non HMO) | Admitting: Internal Medicine

## 2021-08-10 VITALS — BP 106/62 | HR 77 | Temp 98.4°F | Resp 16 | Ht 65.5 in | Wt 117.4 lb

## 2021-08-10 DIAGNOSIS — N390 Urinary tract infection, site not specified: Secondary | ICD-10-CM

## 2021-08-10 DIAGNOSIS — F17210 Nicotine dependence, cigarettes, uncomplicated: Secondary | ICD-10-CM | POA: Diagnosis not present

## 2021-08-10 DIAGNOSIS — F324 Major depressive disorder, single episode, in partial remission: Secondary | ICD-10-CM

## 2021-08-10 DIAGNOSIS — E538 Deficiency of other specified B group vitamins: Secondary | ICD-10-CM | POA: Diagnosis not present

## 2021-08-10 MED ORDER — BUPROPION HCL ER (SR) 100 MG PO TB12: ORAL_TABLET | ORAL | 3 refills | Status: DC

## 2021-08-10 NOTE — Progress Notes (Signed)
[facility] [address] [place], [place] [address]  Internal MEDICINE  Office Visit Note  Patient Name: Barbara [name]  [id number]  [id number]  Date of Service: 08/31/2021  Chief Complaint  Patient presents with   Follow-up    Pt has been more tired, discuss b12   Anemia   Anxiety    HPI  Pt is here for routine follow up - B12 as been low now on IM replacement therapy  - Wants to taper off Lexapro, takes half tab, will like to try something else, interested in smoking cessation as well  - Tapering Topamx as well  - UTI was treated with Macrobid, no acute symptoms  Current Medication: Outpatient Encounter Medications as of 08/10/2021  Medication Sig   carisoprodol (SOMA) 350 MG tablet Take 1 tablet (350 mg total) by mouth at bedtime.   celecoxib (CELEBREX) 200 MG capsule TAKE 1 CAPSULE BY MOUTH  DAILY   escitalopram (LEXAPRO) 20 MG tablet Take 1/2 tab po once a day and increase to 1 tab daily as directed   estradiol (ESTRACE) 1 MG tablet TAKE 1 TABLET BY MOUTH  DAILY (Patient taking differently: Taking half tab once a day)   topiramate (TOPAMAX) 50 MG tablet Take 1 tablet (50 mg total) by mouth daily.   [DISCONTINUED] buPROPion ER (WELLBUTRIN SR) 100 MG 12 hr tablet Take one tab po q am for one 1 week and may increase to 2 tabs a day after one week   buPROPion ER (WELLBUTRIN SR) 100 MG 12 hr tablet Take one tab po q am for one 1 week and may increase to 2 tabs a day after one week   [DISCONTINUED] nitrofurantoin, macrocrystal-monohydrate, (MACROBID) 100 MG capsule Take one tab po bid (Patient not taking: Reported on 08/10/2021)   No facility-administered encounter medications on file as of 08/10/2021.    Surgical History: Past Surgical History:  Procedure Laterality Date   ABDOMINAL HYSTERECTOMY     BREAST BIOPSY Left 2014   neg   FOOT SURGERY Right    2009   LAPAROSCOPIC APPENDECTOMY  12/05/2012   NASAL SEPTUM SURGERY     SISTRUNK PROCEDURE  03/03/2012    Thyroglossal duct cyst excision    Medical History: Past Medical History:  Diagnosis Date   Allergic rhinitis    Anemia    Anxiety    Gallbladder polyp    Migraines     Family History: Family History  Adopted: Yes    Social History   Socioeconomic History   Marital status: Married    Spouse name: Not on file   Number of children: Not on file   Years of education: Not on file   Highest education level: Not on file  Occupational History   Not on file  Tobacco Use   Smoking status: Former    Types: Cigarettes    Quit date: 06/26/2021    Years since quitting: 0.1   Smokeless tobacco: Never   Tobacco comments:    8-10 a day  Substance and Sexual Activity   Alcohol use: Never   Drug use: Never   Sexual activity: Not on file  Other Topics Concern   Not on file  Social History Narrative   Not on file   Social Determinants of Health   Financial Resource Strain: Not on file  Food Insecurity: Not on file  Transportation Needs: Not on file  Physical Activity: Not on file  Stress: Not on file  Social Connections: Not on  file  Intimate Partner Violence: Not on file      Review of Systems  Constitutional:  Positive for fatigue. Negative for chills and unexpected weight change.  HENT:  Positive for postnasal drip. Negative for congestion, rhinorrhea, sneezing and sore throat.   Eyes:  Negative for redness.  Respiratory:  Negative for cough, chest tightness and shortness of breath.   Cardiovascular:  Negative for chest pain and palpitations.  Gastrointestinal:  Negative for abdominal pain, constipation, diarrhea, nausea and vomiting.  Genitourinary:  Negative for dysuria and frequency.  Musculoskeletal:  Negative for arthralgias, back pain, joint swelling and neck pain.  Skin:  Negative for rash.  Neurological: Negative.  Negative for tremors and numbness.  Hematological:  Negative for adenopathy. Does not bruise/bleed easily.  Psychiatric/Behavioral:  Negative  for behavioral problems (Depression), sleep disturbance and suicidal ideas. The patient is not nervous/anxious.    Vital Signs: BP 106/62   Pulse 77   Temp 98.4 F (36.9 C)   Resp 16   Ht 5' 5.5" (1.664 m)   Wt 117 lb 6.4 oz (53.3 kg)   SpO2 98%   BMI 19.24 kg/m    Physical Exam Constitutional:      Appearance: Normal appearance.  HENT:     Head: Normocephalic and atraumatic.     Nose: Nose normal.     Mouth/Throat:     Mouth: Mucous membranes are moist.     Pharynx: No posterior oropharyngeal erythema.  Eyes:     Extraocular Movements: Extraocular movements intact.     Pupils: Pupils are equal, round, and reactive to light.  Cardiovascular:     Pulses: Normal pulses.     Heart sounds: Normal heart sounds.  Pulmonary:     Effort: Pulmonary effort is normal.     Breath sounds: Normal breath sounds.  Neurological:     General: No focal deficit present.     Mental Status: She is alert.  Psychiatric:        Mood and Affect: Mood normal.        Behavior: Behavior normal.       Assessment/Plan: 1. B12 deficiency Continue Replacement as before   2. Urinary tract infection without hematuria, site unspecified Resolved   3. Cigarette nicotine dependence without complication Will taper Lexapro to off  and add Wellbutrin, titrate as indicated   4. Depression, major, single episode, in partial remission (HCC) Will Replace Lexapro to Wellbutrin    General Counseling: Shulamis verbalizes understanding of the findings of todays visit and agrees with plan of treatment. I have discussed any further diagnostic evaluation that may be needed or ordered today. We also reviewed her medications today. she has been encouraged to call the office with any questions or concerns that should arise related to todays visit.    No orders of the defined types were placed in this encounter.   Meds ordered this encounter  Medications   DISCONTD: buPROPion ER (WELLBUTRIN SR) 100 MG 12 hr  tablet    Sig: Take one tab po q am for one 1 week and may increase to 2 tabs a day after one week    Dispense:  60 tablet    Refill:  3   buPROPion ER (WELLBUTRIN SR) 100 MG 12 hr tablet    Sig: Take one tab po q am for one 1 week and may increase to 2 tabs a day after one week    Dispense:  180 tablet    Refill:  3  Total time spent:35 Minutes Time spent includes review of chart, medications, test results, and follow up plan with the patient.   [place] Controlled Substance Database was reviewed by me.   Dr Lyndon Code Internal medicine

## 2021-08-16 ENCOUNTER — Other Ambulatory Visit: Payer: Self-pay

## 2021-08-16 ENCOUNTER — Ambulatory Visit
Admission: RE | Admit: 2021-08-16 | Discharge: 2021-08-16 | Disposition: A | Discharge status: Home / Self Care | Payer: Managed Care, Other (non HMO) | Source: Ambulatory Visit | Attending: Internal Medicine | Admitting: Internal Medicine

## 2021-08-16 DIAGNOSIS — E2839 Other primary ovarian failure: Secondary | ICD-10-CM | POA: Insufficient documentation

## 2021-08-31 ENCOUNTER — Encounter: Payer: Self-pay | Admitting: Internal Medicine

## 2021-10-09 ENCOUNTER — Ambulatory Visit (INDEPENDENT_AMBULATORY_CARE_PROVIDER_SITE_OTHER): Payer: Managed Care, Other (non HMO) | Admitting: Physician Assistant

## 2021-10-09 ENCOUNTER — Encounter: Payer: Self-pay | Admitting: Physician Assistant

## 2021-10-09 ENCOUNTER — Other Ambulatory Visit: Payer: Self-pay

## 2021-10-09 VITALS — BP 126/70 | HR 74 | Temp 98.7°F | Resp 16 | Ht 65.5 in | Wt 122.4 lb

## 2021-10-09 DIAGNOSIS — R0989 Other specified symptoms and signs involving the circulatory and respiratory systems: Secondary | ICD-10-CM

## 2021-10-09 DIAGNOSIS — Z0001 Encounter for general adult medical examination with abnormal findings: Secondary | ICD-10-CM

## 2021-10-09 DIAGNOSIS — E7800 Pure hypercholesterolemia, unspecified: Secondary | ICD-10-CM | POA: Diagnosis not present

## 2021-10-09 DIAGNOSIS — E559 Vitamin D deficiency, unspecified: Secondary | ICD-10-CM

## 2021-10-09 DIAGNOSIS — E538 Deficiency of other specified B group vitamins: Secondary | ICD-10-CM | POA: Diagnosis not present

## 2021-10-09 DIAGNOSIS — M8580 Other specified disorders of bone density and structure, unspecified site: Secondary | ICD-10-CM

## 2021-10-09 DIAGNOSIS — F324 Major depressive disorder, single episode, in partial remission: Secondary | ICD-10-CM

## 2021-10-09 DIAGNOSIS — R5383 Other fatigue: Secondary | ICD-10-CM

## 2021-10-09 DIAGNOSIS — G43509 Persistent migraine aura without cerebral infarction, not intractable, without status migrainosus: Secondary | ICD-10-CM

## 2021-10-09 DIAGNOSIS — R300 Dysuria: Secondary | ICD-10-CM

## 2021-10-09 MED ORDER — TOPIRAMATE 50 MG PO TABS: 50.0000 mg | ORAL_TABLET | Freq: Every day | ORAL | 1 refills | Status: DC

## 2021-10-09 MED ORDER — CYANOCOBALAMIN 1000 MCG/ML IJ SOLN
1000.0000 ug | Freq: Once | INTRAMUSCULAR | Status: AC
Start: 2021-10-09 — End: 2021-10-09
  Administered 2021-10-09: 16:00:00 1000 ug via INTRAMUSCULAR

## 2021-10-09 MED ORDER — CYANOCOBALAMIN 1000 MCG/ML IJ SOLN: INTRAMUSCULAR | 3 refills | Status: DC

## 2021-10-09 NOTE — Progress Notes (Signed)
[facility] [address] [place], [place] [address]  Internal MEDICINE  Office Visit Note  Patient Name: Barbara [name]  [id number]  [id number]  Date of Service: 10/17/2021  Chief Complaint  Patient presents with   Annual Exam   Anemia   Anxiety     HPI Pt is here for routine health maintenance examination -1 cig per day when stressed, big improvement with stopping lexparo and taking 2 tabs wellbutrin -estradiol cut back to 1/2 tab but states she did not like how she was behaving and went back to full tab. Discussed side effects of HRT and that we may need to try tapering again in future. -topamax for migraines, did run out of the medication and needs refill -Sleeping well -BMD did show osteopenia and was advised to supplement calcium and vitamin D -Goes for walks with the dogs and will do stairs -will start fish oil since she cant tolerate statins and vascepa was too expensive. Will check a carotid US for annual follow up due mild-mod plaque seen last year. Will be scheduled for Jan -Due for routine fasting labs -needs to schedule mammogram  Current Medication: Outpatient Encounter Medications as of 10/09/2021  Medication Sig   buPROPion ER (WELLBUTRIN SR) 100 MG 12 hr tablet Take one tab po q am for one 1 week and may increase to 2 tabs a day after one week   carisoprodol (SOMA) 350 MG tablet Take 1 tablet (350 mg total) by mouth at bedtime.   celecoxib (CELEBREX) 200 MG capsule TAKE 1 CAPSULE BY MOUTH  DAILY   cyanocobalamin (,VITAMIN B-12,) 1000 MCG/ML injection Inject 1 Ml (1,000 mcg total)into the muscle once a month   estradiol (ESTRACE) 1 MG tablet TAKE 1 TABLET BY MOUTH  DAILY (Patient taking differently: Taking half tab once a day)   [DISCONTINUED] topiramate (TOPAMAX) 50 MG tablet Take 1 tablet (50 mg total) by mouth daily.   topiramate (TOPAMAX) 50 MG tablet Take 1 tablet (50 mg total) by mouth daily.   [DISCONTINUED] escitalopram (LEXAPRO) 20 MG  tablet Take 1/2 tab po once a day and increase to 1 tab daily as directed (Patient not taking: Reported on 10/09/2021)   [EXPIRED] cyanocobalamin ((VITAMIN B-12)) injection 1,000 mcg    No facility-administered encounter medications on file as of 10/09/2021.    Surgical History: Past Surgical History:  Procedure Laterality Date   ABDOMINAL HYSTERECTOMY     BREAST BIOPSY Left 2014   neg   FOOT SURGERY Right    2009   LAPAROSCOPIC APPENDECTOMY  12/05/2012   NASAL SEPTUM SURGERY     SISTRUNK PROCEDURE  03/03/2012   Thyroglossal duct cyst excision    Medical History: Past Medical History:  Diagnosis Date   Allergic rhinitis    Anemia    Anxiety    Gallbladder polyp    Migraines     Family History: Family History  Adopted: Yes      Review of Systems  Constitutional:  Negative for chills, fatigue and unexpected weight change.  HENT:  Negative for congestion, rhinorrhea, sneezing and sore throat.   Eyes:  Negative for redness.  Respiratory:  Negative for cough, chest tightness and shortness of breath.   Cardiovascular:  Negative for chest pain and palpitations.  Gastrointestinal:  Negative for abdominal pain, constipation, diarrhea, nausea and vomiting.  Genitourinary:  Negative for dysuria and frequency.  Musculoskeletal:  Positive for arthralgias. Negative for back pain, joint swelling and neck pain.  Skin:  Negative for  rash.  Neurological: Negative.  Negative for tremors and numbness.  Hematological:  Negative for adenopathy. Does not bruise/bleed easily.  Psychiatric/Behavioral:  Negative for behavioral problems (Depression), sleep disturbance and suicidal ideas. The patient is nervous/anxious.     Vital Signs: BP 126/70    Pulse 74    Temp 98.7 F (37.1 C)    Resp 16    Ht 5' 5.5" (1.664 m)    Wt 122 lb 6.4 oz (55.5 kg)    SpO2 98%    BMI 20.06 kg/m    Physical Exam Vitals and nursing note reviewed.  Constitutional:      General: She is not in acute  distress.    Appearance: She is well-developed and normal weight. She is not diaphoretic.  HENT:     Head: Normocephalic and atraumatic.     Right Ear: External ear normal.     Left Ear: External ear normal.     Nose: Nose normal.     Mouth/Throat:     Pharynx: No oropharyngeal exudate.  Eyes:     General: No scleral icterus.       Right eye: No discharge.        Left eye: No discharge.     Conjunctiva/sclera: Conjunctivae normal.     Pupils: Pupils are equal, round, and reactive to light.  Neck:     Thyroid: No thyromegaly.     Vascular: No JVD.     Trachea: No tracheal deviation.  Cardiovascular:     Rate and Rhythm: Normal rate and regular rhythm.     Heart sounds: Normal heart sounds. No murmur heard.   No friction rub. No gallop.  Pulmonary:     Effort: Pulmonary effort is normal. No respiratory distress.     Breath sounds: Normal breath sounds. No stridor. No wheezing or rales.  Chest:     Chest wall: No tenderness.  Abdominal:     General: Bowel sounds are normal. There is no distension.     Palpations: Abdomen is soft. There is no mass.     Tenderness: There is no abdominal tenderness. There is no guarding or rebound.  Musculoskeletal:        General: No tenderness or deformity. Normal range of motion.     Cervical back: Normal range of motion and neck supple.  Lymphadenopathy:     Cervical: No cervical adenopathy.  Skin:    General: Skin is warm and dry.     Coloration: Skin is not pale.     Findings: No erythema or rash.  Neurological:     Mental Status: She is alert. Mental status is at baseline.     Cranial Nerves: No cranial nerve deficit.     Motor: No abnormal muscle tone.     Coordination: Coordination normal.     Deep Tendon Reflexes: Reflexes are normal and symmetric.  Psychiatric:        Behavior: Behavior normal.        Thought Content: Thought content normal.        Judgment: Judgment normal.     LABS: Recent Results (from the past 2160  hour(s))  UA/M w/rflx Culture, Routine     Status: None   Collection Time: 10/09/21  3:31 PM   Specimen: Urine   Urine  Result Value Ref Range   Specific Gravity, UA 1.014 1.005 - 1.030   pH, UA 5.5 5.0 - 7.5   Color, UA Yellow Yellow   Appearance Ur Clear Clear  Leukocytes,UA Negative Negative   Protein,UA Negative Negative/Trace   Glucose, UA Negative Negative   Ketones, UA Negative Negative   RBC, UA Negative Negative   Bilirubin, UA Negative Negative   Urobilinogen, Ur 0.2 0.2 - 1.0 mg/dL   Nitrite, UA Negative Negative   Microscopic Examination Comment     Comment: Microscopic follows if indicated.   Microscopic Examination See below:     Comment: Microscopic was indicated and was performed.   Urinalysis Reflex Comment     Comment: This specimen will not reflex to a Urine Culture.  Microscopic Examination     Status: None   Collection Time: 10/09/21  3:31 PM   Urine  Result Value Ref Range   WBC, UA 0-5 0 - 5 /hpf   RBC 0-2 0 - 2 /hpf   Epithelial Cells (non renal) 0-10 0 - 10 /hpf   Casts None seen None seen /lpf   Bacteria, UA None seen None seen/Few       Assessment/Plan: 1. Encounter for general adult medical examination with abnormal findings CPE performed, routine fasting labs ordered, patient will schedule mammogram and is up-to-date on bone mineral density colonoscopy  2. Persistent migraine aura without cerebral infarction and without status migrainosus, not intractable May continue on topiramate - topiramate (TOPAMAX) 50 MG tablet; Take 1 tablet (50 mg total) by mouth daily.  Dispense: 90 tablet; Refill: 1  3. Depression, major, single episode, in partial remission (HCC) Doing well with 2 tabs of Wellbutrin and will continue at this dose  4. Bilateral carotid bruits Will update carotid ultrasound in January for annual monitoring - US Carotid Duplex Bilateral; Future  5. Pure hypercholesterolemia Advised to take fish oil since she is intolerant to  statins, will update lipid panel may need to consider adding something like Zetia if very elevated - Lipid Panel With LDL/HDL Ratio  6. Osteopenia, unspecified location Advised to increase calcium and vitamin D  7. B12 deficiency - B12 and Folate Panel - cyanocobalamin ((VITAMIN B-12)) injection 1,000 mcg  8. Vitamin D deficiency - VITAMIN D 25 Hydroxy (Vit-D Deficiency, Fractures)  9. Other fatigue - CBC w/Diff/Platelet - Comprehensive metabolic panel - Lipid Panel With LDL/HDL Ratio - TSH + free T4 - Fe+TIBC+Fer  10. Dysuria - UA/M w/rflx Culture, Routine - Microscopic Examination   General Counseling: Logen verbalizes understanding of the findings of todays visit and agrees with plan of treatment. I have discussed any further diagnostic evaluation that may be needed or ordered today. We also reviewed her medications today. she has been encouraged to call the office with any questions or concerns that should arise related to todays visit.    Counseling:    Orders Placed This Encounter  Procedures   Microscopic Examination   US Carotid Duplex Bilateral   UA/M w/rflx Culture, Routine   CBC w/Diff/Platelet   Comprehensive metabolic panel   Lipid Panel With LDL/HDL Ratio   TSH + free T4   VITAMIN D 25 Hydroxy (Vit-D Deficiency, Fractures)   B12 and Folate Panel   Fe+TIBC+Fer    Meds ordered this encounter  Medications   topiramate (TOPAMAX) 50 MG tablet    Sig: Take 1 tablet (50 mg total) by mouth daily.    Dispense:  90 tablet    Refill:  1   cyanocobalamin (,VITAMIN B-12,) 1000 MCG/ML injection    Sig: Inject 1 Ml (1,000 mcg total)into the muscle once a month    Dispense:  1 mL    Refill:  3   cyanocobalamin ((VITAMIN B-12)) injection 1,000 mcg    This patient was seen by Lynn Ito, PA-C in collaboration with Dr. Beverely Risen as a part of collaborative care agreement.  Total time spent:35 Minutes  Time spent includes review of chart, medications,  test results, and follow up plan with the patient.     Lyndon Code, MD  Internal Medicine

## 2021-10-10 LAB — MICROSCOPIC EXAMINATION
Bacteria, UA: NONE SEEN
Casts: NONE SEEN /lpf

## 2021-10-10 LAB — UA/M W/RFLX CULTURE, ROUTINE
Bilirubin, UA: NEGATIVE
Glucose, UA: NEGATIVE
Ketones, UA: NEGATIVE
Leukocytes,UA: NEGATIVE
Nitrite, UA: NEGATIVE
Protein,UA: NEGATIVE
RBC, UA: NEGATIVE
Specific Gravity, UA: 1.014 (ref 1.005–1.030)
Urobilinogen, Ur: 0.2 mg/dL (ref 0.2–1.0)
pH, UA: 5.5 (ref 5.0–7.5)

## 2021-11-01 ENCOUNTER — Other Ambulatory Visit: Payer: Self-pay

## 2021-11-01 ENCOUNTER — Ambulatory Visit (INDEPENDENT_AMBULATORY_CARE_PROVIDER_SITE_OTHER): Payer: Managed Care, Other (non HMO)

## 2021-11-01 DIAGNOSIS — R0989 Other specified symptoms and signs involving the circulatory and respiratory systems: Secondary | ICD-10-CM | POA: Diagnosis not present

## 2021-11-07 NOTE — Procedures (Signed)
[place], [place] [address]  DATE OF SERVICE: November 01, 2021  CAROTID DOPPLER INTERPRETATION:  Bilateral Carotid Ultrsasound and Color Doppler Examination was performed. The RIGHT CCA shows mild plaque in the vessel. The LEFT CCA shows no significant plaque in the vessel. There was no significant intimal thickening noted in the RIGHT carotid artery. There was no significant intimal thickening in the LEFT carotid artery.  The RIGHT CCA shows peak systolic velocity of 60 cm per second. The end diastolic velocity is 15 cm per second on the RIGHT side. The RIGHT ICA shows peak systolic velocity of 74 per second. RIGHT sided ICA end diastolic velocity is 15 cm per second. The RIGHT ECA shows a peak systolic velocity of 92 cm per second. The ICA/CCA ratio is calculated to be 1.2. This suggests less than 50% stenosis. The Vertebral Artery shows antegrade flow.  The LEFT CCA shows peak systolic velocity of 88 cm per second. The end diastolic velocity is 20 cm per second on the LEFT side. The LEFT ICA shows peak systolic velocity of 94 per second. LEFT sided ICA end diastolic velocity is 21 cm per second. The LEFT ECA shows a peak systolic velocity of 0000000 cm per second. The ICA/CCA ratio is calculated to be 1.1. This suggests less than 50% stenosis. The Vertebral Artery shows antegrade flow.   Impression:    The RIGHT CAROTID shows less than 50% stenosis. The LEFT CAROTID shows less than 50% stenosis.  There is no significant plaque formation noted on the LEFT and mild plaque on the RIGHT  side. Consider a repeat Carotid doppler if clinical situation and symptoms warrant in 6-12 months. Patient should be encouraged to change lifestyles such as smoking cessation, regular exercise and dietary modification. Use of statins in the right clinical setting and ASA is encouraged.  Allyne Gee, MD [facility]

## 2021-11-10 LAB — COMPREHENSIVE METABOLIC PANEL
ALT: 8 IU/L (ref 0–32)
AST: 11 IU/L (ref 0–40)
Albumin/Globulin Ratio: 1.7 (ref 1.2–2.2)
Albumin: 4.4 g/dL (ref 3.8–4.9)
Alkaline Phosphatase: 53 IU/L (ref 44–121)
BUN/Creatinine Ratio: 18 (ref 12–28)
BUN: 20 mg/dL (ref 8–27)
Bilirubin Total: 0.5 mg/dL (ref 0.0–1.2)
CO2: 23 mmol/L (ref 20–29)
Calcium: 9.3 mg/dL (ref 8.7–10.3)
Chloride: 102 mmol/L (ref 96–106)
Creatinine, Ser: 1.12 mg/dL — ABNORMAL HIGH (ref 0.57–1.00)
Globulin, Total: 2.6 g/dL (ref 1.5–4.5)
Glucose: 98 mg/dL (ref 70–99)
Potassium: 4.5 mmol/L (ref 3.5–5.2)
Sodium: 136 mmol/L (ref 134–144)
Total Protein: 7 g/dL (ref 6.0–8.5)
eGFR: 56 mL/min/{1.73_m2} — ABNORMAL LOW (ref >59)

## 2021-11-10 LAB — CBC WITH DIFFERENTIAL/PLATELET
Basophils Absolute: 0 10*3/uL (ref 0.0–0.2)
Basos: 1 %
EOS (ABSOLUTE): 0.3 10*3/uL (ref 0.0–0.4)
Eos: 4 %
Hematocrit: 39.8 % (ref 34.0–46.6)
Hemoglobin: 13.2 g/dL (ref 11.1–15.9)
Immature Grans (Abs): 0 10*3/uL (ref 0.0–0.1)
Immature Granulocytes: 0 %
Lymphocytes Absolute: 2.4 10*3/uL (ref 0.7–3.1)
Lymphs: 41 %
MCH: 31.8 pg (ref 26.6–33.0)
MCHC: 33.2 g/dL (ref 31.5–35.7)
MCV: 96 fL (ref 79–97)
Monocytes Absolute: 0.6 10*3/uL (ref 0.1–0.9)
Monocytes: 10 %
Neutrophils Absolute: 2.6 10*3/uL (ref 1.4–7.0)
Neutrophils: 44 %
Platelets: 247 10*3/uL (ref 150–450)
RBC: 4.15 x10E6/uL (ref 3.77–5.28)
RDW: 12.4 % (ref 11.7–15.4)
WBC: 5.8 10*3/uL (ref 3.4–10.8)

## 2021-11-10 LAB — LIPID PANEL WITH LDL/HDL RATIO
Cholesterol, Total: 215 mg/dL — ABNORMAL HIGH (ref 100–199)
HDL: 78 mg/dL (ref >39)
LDL Chol Calc (NIH): 123 mg/dL — ABNORMAL HIGH (ref 0–99)
LDL/HDL Ratio: 1.6 ratio (ref 0.0–3.2)
Triglycerides: 82 mg/dL (ref 0–149)
VLDL Cholesterol Cal: 14 mg/dL (ref 5–40)

## 2021-11-10 LAB — VITAMIN D 25 HYDROXY (VIT D DEFICIENCY, FRACTURES): Vit D, 25-Hydroxy: 75.3 ng/mL (ref 30.0–100.0)

## 2021-11-10 LAB — B12 AND FOLATE PANEL
Folate: 2.8 ng/mL — ABNORMAL LOW (ref >3.0)
Vitamin B-12: 440 pg/mL (ref 232–1245)

## 2021-11-10 LAB — IRON,TIBC AND FERRITIN PANEL
Ferritin: 166 ng/mL — ABNORMAL HIGH (ref 15–150)
Iron Saturation: 50 % (ref 15–55)
Iron: 112 ug/dL (ref 27–159)
Total Iron Binding Capacity: 224 ug/dL — ABNORMAL LOW (ref 250–450)
UIBC: 112 ug/dL — ABNORMAL LOW (ref 131–425)

## 2021-11-10 LAB — TSH+FREE T4
Free T4: 1.27 ng/dL (ref 0.82–1.77)
TSH: 2.78 u[IU]/mL (ref 0.450–4.500)

## 2021-11-13 ENCOUNTER — Ambulatory Visit: Payer: Managed Care, Other (non HMO) | Admitting: Physician Assistant

## 2021-11-13 ENCOUNTER — Encounter: Payer: Self-pay | Admitting: Physician Assistant

## 2021-11-13 ENCOUNTER — Other Ambulatory Visit: Payer: Self-pay

## 2021-11-13 VITALS — BP 136/73 | HR 73 | Temp 98.0°F | Resp 16 | Ht 65.5 in | Wt 125.4 lb

## 2021-11-13 DIAGNOSIS — J209 Acute bronchitis, unspecified: Secondary | ICD-10-CM | POA: Diagnosis not present

## 2021-11-13 DIAGNOSIS — E7800 Pure hypercholesterolemia, unspecified: Secondary | ICD-10-CM

## 2021-11-13 DIAGNOSIS — E538 Deficiency of other specified B group vitamins: Secondary | ICD-10-CM | POA: Diagnosis not present

## 2021-11-13 MED ORDER — ALBUTEROL SULFATE HFA 108 (90 BASE) MCG/ACT IN AERS: 2.0000 | INHALATION_SPRAY | Freq: Four times a day (QID) | RESPIRATORY_TRACT | 0 refills | Status: DC | PRN

## 2021-11-13 NOTE — Progress Notes (Signed)
[facility] [address] [place], [place] [address]  Internal MEDICINE  Office Visit Note  Patient Name: Barbara [name]  [id number]  [id number]  Date of Service: 11/19/2021  Chief Complaint  Patient presents with   Follow-up   Anemia   Anxiety   Nasal Congestion    Had the flu during christmas, but congestion overall still has not really improved - mucus is clear, has a cough, has a wheezing sound    HPI Pt is here for routine follow up -Has stopped smoking entirely and is taking fish oil  -Flu at Christmas and coughing since then and still has some cough though more mild. Is wheezing some and a little SOB with exertion. Was taking mucinex and cough syrup.  -not drinking much water, will monitor kidney function as it was slightly abnormal -Reviewed labs showing low folate and will add folic acid, ferritin slightly elevated, cholesterol elevated and is trying fish oil now -Carotid US showed less than 50% stenosis bilaterally with no significant plaque on left and mild plaque on right which is improved from prior US last year  Current Medication: Outpatient Encounter Medications as of 11/13/2021  Medication Sig   albuterol (VENTOLIN HFA) 108 (90 Base) MCG/ACT inhaler Inhale 2 puffs into the lungs every 6 (six) hours as needed for wheezing or shortness of breath.   buPROPion ER (WELLBUTRIN SR) 100 MG 12 hr tablet Take one tab po q am for one 1 week and may increase to 2 tabs a day after one week   carisoprodol (SOMA) 350 MG tablet Take 1 tablet (350 mg total) by mouth at bedtime.   celecoxib (CELEBREX) 200 MG capsule TAKE 1 CAPSULE BY MOUTH  DAILY   cyanocobalamin (,VITAMIN B-12,) 1000 MCG/ML injection Inject 1 Ml (1,000 mcg total)into the muscle once a month   estradiol (ESTRACE) 1 MG tablet TAKE 1 TABLET BY MOUTH  DAILY (Patient taking differently: Taking half tab once a day)   topiramate (TOPAMAX) 50 MG tablet Take 1 tablet (50 mg total) by mouth daily.   No  facility-administered encounter medications on file as of 11/13/2021.    Surgical History: Past Surgical History:  Procedure Laterality Date   ABDOMINAL HYSTERECTOMY     BREAST BIOPSY Left 2014   neg   FOOT SURGERY Right    2009   LAPAROSCOPIC APPENDECTOMY  12/05/2012   NASAL SEPTUM SURGERY     SISTRUNK PROCEDURE  03/03/2012   Thyroglossal duct cyst excision    Medical History: Past Medical History:  Diagnosis Date   Allergic rhinitis    Anemia    Anxiety    Gallbladder polyp    Migraines     Family History: Family History  Adopted: Yes    Social History   Socioeconomic History   Marital status: Married    Spouse name: Not on file   Number of children: Not on file   Years of education: Not on file   Highest education level: Not on file  Occupational History   Not on file  Tobacco Use   Smoking status: Former    Types: Cigarettes    Quit date: 06/26/2021    Years since quitting: 0.4   Smokeless tobacco: Never   Tobacco comments:    8-10 a day  Substance and Sexual Activity   Alcohol use: Never   Drug use: Never   Sexual activity: Not on file  Other Topics Concern   Not on file  Social History Narrative  Not on file   Social Determinants of Health   Financial Resource Strain: Not on file  Food Insecurity: Not on file  Transportation Needs: Not on file  Physical Activity: Not on file  Stress: Not on file  Social Connections: Not on file  Intimate Partner Violence: Not on file      Review of Systems  Constitutional:  Negative for chills, fatigue and unexpected weight change.  HENT:  Negative for congestion, rhinorrhea, sneezing and sore throat.   Eyes:  Negative for redness.  Respiratory:  Positive for cough and wheezing. Negative for chest tightness and shortness of breath.   Cardiovascular:  Negative for chest pain and palpitations.  Gastrointestinal:  Negative for abdominal pain, constipation, diarrhea, nausea and vomiting.  Genitourinary:   Negative for dysuria and frequency.  Musculoskeletal:  Positive for arthralgias. Negative for back pain, joint swelling and neck pain.  Skin:  Negative for rash.  Neurological: Negative.  Negative for tremors and numbness.  Hematological:  Negative for adenopathy. Does not bruise/bleed easily.  Psychiatric/Behavioral:  Negative for behavioral problems (Depression), sleep disturbance and suicidal ideas. The patient is nervous/anxious.    Vital Signs: BP 136/73    Pulse 73    Temp 98 F (36.7 C)    Resp 16    Ht 5' 5.5" (1.664 m)    Wt 125 lb 6.4 oz (56.9 kg)    SpO2 98%    BMI 20.55 kg/m    Physical Exam Vitals and nursing note reviewed.  Constitutional:      General: She is not in acute distress.    Appearance: She is well-developed. She is not diaphoretic.  HENT:     Head: Normocephalic and atraumatic.     Mouth/Throat:     Pharynx: No oropharyngeal exudate.  Eyes:     Pupils: Pupils are equal, round, and reactive to light.  Neck:     Thyroid: No thyromegaly.     Vascular: No JVD.     Trachea: No tracheal deviation.  Cardiovascular:     Rate and Rhythm: Normal rate and regular rhythm.     Heart sounds: Normal heart sounds. No murmur heard.   No friction rub. No gallop.  Pulmonary:     Effort: Pulmonary effort is normal. No respiratory distress.     Breath sounds: No wheezing or rales.  Chest:     Chest wall: No tenderness.  Abdominal:     General: Bowel sounds are normal.     Palpations: Abdomen is soft.  Musculoskeletal:        General: Normal range of motion.     Cervical back: Normal range of motion and neck supple.  Lymphadenopathy:     Cervical: No cervical adenopathy.  Skin:    General: Skin is warm and dry.  Neurological:     Mental Status: She is alert and oriented to person, place, and time.     Cranial Nerves: No cranial nerve deficit.  Psychiatric:        Behavior: Behavior normal.        Thought Content: Thought content normal.        Judgment:  Judgment normal.       Assessment/Plan: 1. Acute bronchitis, unspecified organism May use inhaler as needed, will call if worsening - albuterol (VENTOLIN HFA) 108 (90 Base) MCG/ACT inhaler; Inhale 2 puffs into the lungs every 6 (six) hours as needed for wheezing or shortness of breath.  Dispense: 8 g; Refill: 0  2. Pure hypercholesterolemia  Will continue fish oil as she is unable to tolerate statin, repatha, or zetia. Continue tot work on diet and exercise  3. Folate deficiency Will start OTC folic scid supplement   General Counseling: Vivianne verbalizes understanding of the findings of todays visit and agrees with plan of treatment. I have discussed any further diagnostic evaluation that may be needed or ordered today. We also reviewed her medications today. she has been encouraged to call the office with any questions or concerns that should arise related to todays visit.    No orders of the defined types were placed in this encounter.   Meds ordered this encounter  Medications   albuterol (VENTOLIN HFA) 108 (90 Base) MCG/ACT inhaler    Sig: Inhale 2 puffs into the lungs every 6 (six) hours as needed for wheezing or shortness of breath.    Dispense:  8 g    Refill:  0    This patient was seen by Lynn Ito, PA-C in collaboration with Dr. Beverely Risen as a part of collaborative care agreement.   Total time spent:30 Minutes Time spent includes review of chart, medications, test results, and follow up plan with the patient.      Dr Lyndon Code Internal medicine

## 2021-11-21 ENCOUNTER — Other Ambulatory Visit: Payer: Self-pay | Admitting: Internal Medicine

## 2021-11-21 DIAGNOSIS — E2839 Other primary ovarian failure: Secondary | ICD-10-CM

## 2021-12-05 ENCOUNTER — Encounter: Payer: Self-pay | Admitting: Internal Medicine

## 2021-12-06 ENCOUNTER — Other Ambulatory Visit: Payer: Self-pay | Admitting: Physician Assistant

## 2021-12-06 DIAGNOSIS — J209 Acute bronchitis, unspecified: Secondary | ICD-10-CM

## 2021-12-19 ENCOUNTER — Encounter: Payer: Self-pay | Admitting: Internal Medicine

## 2021-12-19 ENCOUNTER — Telehealth: Payer: Managed Care, Other (non HMO) | Admitting: Internal Medicine

## 2021-12-19 DIAGNOSIS — U071 COVID-19: Secondary | ICD-10-CM

## 2021-12-19 DIAGNOSIS — J208 Acute bronchitis due to other specified organisms: Secondary | ICD-10-CM

## 2021-12-19 MED ORDER — NIRMATRELVIR/RITONAVIR (PAXLOVID)TABLET: 3.0000 | ORAL_TABLET | Freq: Two times a day (BID) | ORAL | 0 refills | Status: AC

## 2021-12-19 NOTE — Progress Notes (Signed)
[facility] [address] [place], [place] [address]  Internal MEDICINE  Telephone Visit  Patient Name: Barbara [name]  [id number]  [id number]  Date of Service: 01/01/2022  I connected with the patient at 1224 by telephone and verified the patients identity using two identifiers.   I discussed the limitations, risks, security and privacy concerns of performing an evaluation and management service by telephone and the availability of in person appointments. I also discussed with the patient that there may be a patient responsible charge related to the service.  The patient expressed understanding and agrees to proceed.    Chief Complaint  Patient presents with   Telephone Screen    [phone]   Telephone Assessment   Covid Positive    Sore throat, bad head congestion that is making it hard to breathe - taking OTC Dayquil/Nyquil + Tylenol    HPI   Pt is connected for acute and sick visit. Patient is seen for COVID positive symptoms sore throat cough and congestion patient is also short of breath she is also smoker, denies chest pain or headache  Current Medication: Outpatient Encounter Medications as of 12/19/2021  Medication Sig   albuterol (VENTOLIN HFA) 108 (90 Base) MCG/ACT inhaler TAKE 2 PUFFS BY MOUTH EVERY 6 HOURS AS NEEDED FOR WHEEZE OR SHORTNESS OF BREATH   buPROPion ER (WELLBUTRIN SR) 100 MG 12 hr tablet Take one tab po q am for one 1 week and may increase to 2 tabs a day after one week   carisoprodol (SOMA) 350 MG tablet Take 1 tablet (350 mg total) by mouth at bedtime.   celecoxib (CELEBREX) 200 MG capsule TAKE 1 CAPSULE BY MOUTH  DAILY   cyanocobalamin (,VITAMIN B-12,) 1000 MCG/ML injection Inject 1 Ml (1,000 mcg total)into the muscle once a month   estradiol (ESTRACE) 1 MG tablet TAKE 1 TABLET BY MOUTH  DAILY   [EXPIRED] nirmatrelvir/ritonavir EUA (PAXLOVID) 20 x 150 MG & 10 x 100MG  TABS Take 3 tablets by mouth 2 (two) times daily for 5 days. (Take  nirmatrelvir 150 mg two tablets twice daily for 5 days and ritonavir 100 mg one tablet twice daily for 5 days) Patient GFR is 56   topiramate (TOPAMAX) 50 MG tablet Take 1 tablet (50 mg total) by mouth daily.   No facility-administered encounter medications on file as of 12/19/2021.    Surgical History: Past Surgical History:  Procedure Laterality Date   ABDOMINAL HYSTERECTOMY     BREAST BIOPSY Left 2014   neg   FOOT SURGERY Right    2009   LAPAROSCOPIC APPENDECTOMY  12/05/2012   NASAL SEPTUM SURGERY     SISTRUNK PROCEDURE  03/03/2012   Thyroglossal duct cyst excision    Medical History: Past Medical History:  Diagnosis Date   Allergic rhinitis    Anemia    Anxiety    Gallbladder polyp    Migraines     Family History: Family History  Adopted: Yes    Social History   Socioeconomic History   Marital status: Married    Spouse name: Not on file   Number of children: Not on file   Years of education: Not on file   Highest education level: Not on file  Occupational History   Not on file  Tobacco Use   Smoking status: Former    Types: Cigarettes    Quit date: 06/26/2021    Years since quitting: 0.5   Smokeless tobacco: Never   Tobacco comments:  8-10 a day  Substance and Sexual Activity   Alcohol use: Never   Drug use: Never   Sexual activity: Not on file  Other Topics Concern   Not on file  Social History Narrative   Not on file   Social Determinants of Health   Financial Resource Strain: Not on file  Food Insecurity: Not on file  Transportation Needs: Not on file  Physical Activity: Not on file  Stress: Not on file  Social Connections: Not on file  Intimate Partner Violence: Not on file      Review of Systems  Vital Signs: There were no vitals taken for this visit.   Observation/Objective:  Pt looks ill and is congested    Assessment/Plan: 1. Acute bronchitis due to COVID-19 virus Patient is within time period for treatment with  antiviral  can be treated with antiviral Paxlovid patient is instructed to give [place] a call if her symptoms are not improving - nirmatrelvir/ritonavir EUA (PAXLOVID) 20 x 150 MG & 10 x 100MG  TABS; Take 3 tablets by mouth 2 (two) times daily for 5 days. (Take nirmatrelvir 150 mg two tablets twice daily for 5 days and ritonavir 100 mg one tablet twice daily for 5 days) Patient GFR is 56  Dispense: 30 tablet; Refill: 0   General Counseling: Mela verbalizes understanding of the findings of today's phone visit and agrees with plan of treatment. I have discussed any further diagnostic evaluation that may be needed or ordered today. We also reviewed her medications today. she has been encouraged to call the office with any questions or concerns that should arise related to todays visit.    No orders of the defined types were placed in this encounter.   Meds ordered this encounter  Medications   nirmatrelvir/ritonavir EUA (PAXLOVID) 20 x 150 MG & 10 x 100MG  TABS    Sig: Take 3 tablets by mouth 2 (two) times daily for 5 days. (Take nirmatrelvir 150 mg two tablets twice daily for 5 days and ritonavir 100 mg one tablet twice daily for 5 days) Patient GFR is 56    Dispense:  30 tablet    Refill:  0    Time spent:15 Minutes    Dr Internal medicine

## 2022-02-12 ENCOUNTER — Encounter: Payer: Self-pay | Admitting: Physician Assistant

## 2022-02-12 ENCOUNTER — Ambulatory Visit: Payer: Managed Care, Other (non HMO) | Admitting: Physician Assistant

## 2022-02-12 DIAGNOSIS — G43509 Persistent migraine aura without cerebral infarction, not intractable, without status migrainosus: Secondary | ICD-10-CM

## 2022-02-12 DIAGNOSIS — N289 Disorder of kidney and ureter, unspecified: Secondary | ICD-10-CM

## 2022-02-12 DIAGNOSIS — F324 Major depressive disorder, single episode, in partial remission: Secondary | ICD-10-CM | POA: Diagnosis not present

## 2022-02-12 DIAGNOSIS — E538 Deficiency of other specified B group vitamins: Secondary | ICD-10-CM

## 2022-02-12 DIAGNOSIS — E7800 Pure hypercholesterolemia, unspecified: Secondary | ICD-10-CM | POA: Diagnosis not present

## 2022-02-12 MED ORDER — TOPIRAMATE 50 MG PO TABS: ORAL_TABLET | ORAL | 1 refills | Status: DC

## 2022-02-12 NOTE — Progress Notes (Signed)
[facility] ?[address] ?[place], [place] [id number] ? ?Internal MEDICINE  ?Office Visit Note ? ?Patient Name: Barbara [name] ? [id number] 10, 2062  ?[id number] ? ?Date of Service: 02/12/2022 ? ?Chief Complaint  ?Patient presents with  ? Follow-up  ?  Needs higher dose of migraine medication  ? Anxiety  ? Anemia  ? Quality Metric Gaps  ?  Mammogram  ? ? ?HPI ?Pt is here for routine follow up ?-Migraines are worse, reports she was taking 2 tabs of 25mg  and when dose changed to 50mg  she didn't realize and was taking 2 tabs still and then when she tried to go back down to 1 tab of 50mg  total her migraines became worse. Would like to go up to 2 tabs of 50mg  ?-taking wellbutrin instead of lexapro and not working quite as well. Willing to keep trying but may want to add back in lexapro at night and decrease wellbutrin to 1 tab in AM, but will keep going with wellbutrin 200mg  for now ?-quit smoking 7 months ?-carotid US reviewed and showed <50% stenosis and no significant plaque on left and mild on right. This is improved from last year. ?-labs reviewed showing slightly abnormal kidney function--does state she does not drink enough water. Discussed considering renal US vs rechecking labs and if not improving then move forward with US. Her cholesterol is also elevated--will start fish oil. Declines statin at this time. Folate also low and will start folic acid ?-some right elbow/arm pain consistent with tendonitis and will try ice and brace ? ?Current Medication: ?Outpatient Encounter Medications as of 02/12/2022  ?Medication Sig  ? albuterol (VENTOLIN HFA) 108 (90 Base) MCG/ACT inhaler TAKE 2 PUFFS BY MOUTH EVERY 6 HOURS AS NEEDED FOR WHEEZE OR SHORTNESS OF BREATH  ? buPROPion ER (WELLBUTRIN SR) 100 MG 12 hr tablet Take one tab po q am for one 1 week and may increase to 2 tabs a day after one week  ? carisoprodol (SOMA) 350 MG tablet Take 1 tablet (350 mg total) by mouth at bedtime.  ? celecoxib (CELEBREX) 200 MG capsule  TAKE 1 CAPSULE BY MOUTH  DAILY  ? cyanocobalamin (,VITAMIN B-12,) 1000 MCG/ML injection Inject 1 Ml (1,000 mcg total)into the muscle once a month  ? estradiol (ESTRACE) 1 MG tablet TAKE 1 TABLET BY MOUTH  DAILY  ? [DISCONTINUED] topiramate (TOPAMAX) 50 MG tablet Take 1 tablet (50 mg total) by mouth daily.  ? topiramate (TOPAMAX) 50 MG tablet Take 2 tablets by mouth daily  ? ?No facility-administered encounter medications on file as of 02/12/2022.  ? ? ?Surgical History: ?Past Surgical History:  ?Procedure Laterality Date  ? ABDOMINAL HYSTERECTOMY    ? BREAST BIOPSY Left 2014  ? neg  ? FOOT SURGERY Right   ? 2009  ? LAPAROSCOPIC APPENDECTOMY  12/05/2012  ? NASAL SEPTUM SURGERY    ? SISTRUNK PROCEDURE  03/03/2012  ? Thyroglossal duct cyst excision  ? ? ?Medical History: ?Past Medical History:  ?Diagnosis Date  ? Allergic rhinitis   ? Anemia   ? Anxiety   ? Gallbladder polyp   ? Migraines   ? ? ?Family History: ?Family History  ?Adopted: Yes  ? ? ?Social History  ? ?Socioeconomic History  ? Marital status: Married  ?  Spouse name: Not on file  ? Number of children: Not on file  ? Years of education: Not on file  ? Highest education level: Not on file  ?Occupational History  ? Not on file  ?  Tobacco Use  ? Smoking status: Former  ?  Types: Cigarettes  ?  Quit date: 06/26/2021  ?  Years since quitting: 0.6  ? Smokeless tobacco: Never  ? Tobacco comments:  ?  8-10 a day  ?Substance and Sexual Activity  ? Alcohol use: Never  ? Drug use: Never  ? Sexual activity: Not on file  ?Other Topics Concern  ? Not on file  ?Social History Narrative  ? Not on file  ? ?Social Determinants of Health  ? ?Financial Resource Strain: Not on file  ?Food Insecurity: Not on file  ?Transportation Needs: Not on file  ?Physical Activity: Not on file  ?Stress: Not on file  ?Social Connections: Not on file  ?Intimate Partner Violence: Not on file  ? ? ? ? ?Review of Systems  ?Constitutional:  Negative for chills, fatigue and unexpected weight change.   ?HENT:  Negative for congestion, rhinorrhea, sneezing and sore throat.   ?Eyes:  Negative for redness.  ?Respiratory:  Negative for cough, chest tightness and shortness of breath.   ?Cardiovascular:  Negative for chest pain and palpitations.  ?Gastrointestinal:  Negative for abdominal pain, constipation, diarrhea, nausea and vomiting.  ?Genitourinary:  Negative for dysuria and frequency.  ?Musculoskeletal:  Positive for arthralgias. Negative for back pain, joint swelling and neck pain.  ?Skin:  Negative for rash.  ?Neurological:  Positive for headaches. Negative for tremors and numbness.  ?Hematological:  Negative for adenopathy. Does not bruise/bleed easily.  ?Psychiatric/Behavioral:  Negative for behavioral problems (Depression), sleep disturbance and suicidal ideas. The patient is nervous/anxious.   ? ?Vital Signs: ?BP 126/72 Comment: 142/70  Pulse 69   Temp 97.7 ?F (36.5 ?C)   Resp 16   Ht 5' 5.5" (1.664 m)   Wt 129 lb (58.5 kg)   SpO2 99%   BMI 21.14 kg/m?  ? ? ?Physical Exam ?Vitals and nursing note reviewed.  ?Constitutional:   ?   General: She is not in acute distress. ?   Appearance: She is well-developed. She is not diaphoretic.  ?HENT:  ?   Head: Normocephalic and atraumatic.  ?   Mouth/Throat:  ?   Pharynx: No oropharyngeal exudate.  ?Eyes:  ?   Pupils: Pupils are equal, round, and reactive to light.  ?Neck:  ?   Thyroid: No thyromegaly.  ?   Vascular: No JVD.  ?   Trachea: No tracheal deviation.  ?Cardiovascular:  ?   Rate and Rhythm: Normal rate and regular rhythm.  ?   Heart sounds: Normal heart sounds. No murmur heard. ?  No friction rub. No gallop.  ?Pulmonary:  ?   Effort: Pulmonary effort is normal. No respiratory distress.  ?   Breath sounds: No wheezing or rales.  ?Chest:  ?   Chest wall: No tenderness.  ?Abdominal:  ?   General: Bowel sounds are normal.  ?   Palpations: Abdomen is soft.  ?Musculoskeletal:     ?   General: Tenderness present. Normal range of motion.  ?   Cervical back:  Normal range of motion and neck supple.  ?Lymphadenopathy:  ?   Cervical: No cervical adenopathy.  ?Skin: ?   General: Skin is warm and dry.  ?Neurological:  ?   Mental Status: She is alert and oriented to person, place, and time.  ?   Cranial Nerves: No cranial nerve deficit.  ?Psychiatric:     ?   Behavior: Behavior normal.     ?   Thought Content: Thought content  normal.     ?   Judgment: Judgment normal.  ? ? ? ? ? ?Assessment/Plan: ?1. Persistent migraine aura without cerebral infarction and without status migrainosus, not intractable ?Will increase to 2 tabs 50mg  topamax daily ?- topiramate (TOPAMAX) 50 MG tablet; Take 2 tablets by mouth daily  Dispense: 180 tablet; Refill: 1 ? ?2. Depression, major, single episode, in partial remission (HCC) ?Will continue wellbutrin for now, may consider restarting lexapro at night in future if not well controlled ? ?3. Pure hypercholesterolemia ?Will work on diet and exercise and start fish oil, declines statin ? ?4. Folate deficiency ?Will start folic acid supplement ? ?5. Abnormal renal function ?Will recheck labs next visit and if not improving order renal ? ? ?General Counseling: Barbara [name] verbalizes understanding of the findings of todays visit and agrees with plan of treatment. I have discussed any further diagnostic evaluation that may be needed or ordered today. We also reviewed her medications today. she has been encouraged to call the office with any questions or concerns that should arise related to todays visit. ? ? ? ?No orders of the defined types were placed in this encounter. ? ? ?Meds ordered this encounter  ?Medications  ? topiramate (TOPAMAX) 50 MG tablet  ?  Sig: Take 2 tablets by mouth daily  ?  Dispense:  180 tablet  ?  Refill:  1  ? ? ?This patient was seen by [place], PA-C in collaboration with Dr. Lynn Ito as a part of collaborative care agreement. ? ? ?Total time spent:30 Minutes ?Time spent includes review of chart, medications, test  results, and follow up plan with the patient.  ? ? ? ? ?Dr Beverely Risen ?Internal medicine  ?

## 2022-03-01 ENCOUNTER — Other Ambulatory Visit: Payer: Self-pay | Admitting: Physician Assistant

## 2022-03-01 DIAGNOSIS — G43509 Persistent migraine aura without cerebral infarction, not intractable, without status migrainosus: Secondary | ICD-10-CM

## 2022-04-17 ENCOUNTER — Other Ambulatory Visit: Payer: Self-pay | Admitting: Physician Assistant

## 2022-04-17 DIAGNOSIS — M150 Primary generalized (osteo)arthritis: Secondary | ICD-10-CM

## 2022-06-01 ENCOUNTER — Other Ambulatory Visit: Payer: Self-pay | Admitting: Internal Medicine

## 2022-06-11 ENCOUNTER — Ambulatory Visit: Payer: Managed Care, Other (non HMO) | Admitting: Physician Assistant

## 2022-06-15 ENCOUNTER — Encounter: Payer: Self-pay | Admitting: Physician Assistant

## 2022-06-15 ENCOUNTER — Ambulatory Visit: Payer: Managed Care, Other (non HMO) | Admitting: Physician Assistant

## 2022-06-15 ENCOUNTER — Other Ambulatory Visit: Payer: Self-pay | Admitting: Infectious Diseases

## 2022-06-15 DIAGNOSIS — E538 Deficiency of other specified B group vitamins: Secondary | ICD-10-CM | POA: Diagnosis not present

## 2022-06-15 DIAGNOSIS — R5383 Other fatigue: Secondary | ICD-10-CM

## 2022-06-15 DIAGNOSIS — R1310 Dysphagia, unspecified: Secondary | ICD-10-CM

## 2022-06-15 DIAGNOSIS — E559 Vitamin D deficiency, unspecified: Secondary | ICD-10-CM

## 2022-06-15 DIAGNOSIS — E7800 Pure hypercholesterolemia, unspecified: Secondary | ICD-10-CM

## 2022-06-15 DIAGNOSIS — G43509 Persistent migraine aura without cerebral infarction, not intractable, without status migrainosus: Secondary | ICD-10-CM | POA: Diagnosis not present

## 2022-06-15 DIAGNOSIS — R300 Dysuria: Secondary | ICD-10-CM | POA: Diagnosis not present

## 2022-06-15 DIAGNOSIS — M62838 Other muscle spasm: Secondary | ICD-10-CM | POA: Diagnosis not present

## 2022-06-15 LAB — POCT URINALYSIS DIPSTICK
Bilirubin, UA: NEGATIVE
Blood, UA: POSITIVE
Glucose, UA: NEGATIVE
Leukocytes, UA: NEGATIVE
Nitrite, UA: NEGATIVE
Protein, UA: POSITIVE — AB
Spec Grav, UA: 1.01 (ref 1.010–1.025)
Urobilinogen, UA: 0.2 E.U./dL
pH, UA: 6.5 (ref 5.0–8.0)

## 2022-06-15 MED ORDER — CARISOPRODOL 350 MG PO TABS: 350.0000 mg | ORAL_TABLET | Freq: Every day | ORAL | 0 refills | Status: DC

## 2022-06-15 MED ORDER — TOPIRAMATE 50 MG PO TABS: 100.0000 mg | ORAL_TABLET | Freq: Every day | ORAL | 3 refills | Status: DC

## 2022-06-15 NOTE — Progress Notes (Signed)
[facility] [address] [place], [place] [address]  Internal MEDICINE  Office Visit Note  Patient Name: Barbara [name]  [id number]  [id number]  Date of Service: 06/26/2022  Chief Complaint  Patient presents with   Follow-up   Quality Metric Gaps    Mammogram    HPI Pt is here for routine follow up -she is taking wellbutrin 200mg  daily -Takes 1/2 tab soma most nights, sleeping well -did have some labs with her work a little while ago, had her cholesterol and A1c checked, but these are not available to me -Some swallowing difficulty recently and feels things get stuck. Will order swallow study to evaluate  -would like to check urine today as she has had some UTI symptoms recently and wants to double check -due for routine fasting labs prior to CPE  Current Medication: Outpatient Encounter Medications as of 06/15/2022  Medication Sig   albuterol (VENTOLIN HFA) 108 (90 Base) MCG/ACT inhaler TAKE 2 PUFFS BY MOUTH EVERY 6 HOURS AS NEEDED FOR WHEEZE OR SHORTNESS OF BREATH   buPROPion ER (WELLBUTRIN SR) 100 MG 12 hr tablet TAKE 1 TABLET BY MOUTH IN THE  MORNING FOR 1 WEEK, THEN 2  TABLETS ONCE DAILY IN THE  MORNING AFTERWARDS.   celecoxib (CELEBREX) 200 MG capsule TAKE 1 CAPSULE BY MOUTH  DAILY   cyanocobalamin (,VITAMIN B-12,) 1000 MCG/ML injection Inject 1 Ml (1,000 mcg total)into the muscle once a month   estradiol (ESTRACE) 1 MG tablet TAKE 1 TABLET BY MOUTH  DAILY   [DISCONTINUED] carisoprodol (SOMA) 350 MG tablet Take 1 tablet (350 mg total) by mouth at bedtime.   [DISCONTINUED] topiramate (TOPAMAX) 50 MG tablet TAKE 1 TABLET BY MOUTH DAILY   carisoprodol (SOMA) 350 MG tablet Take 1 tablet (350 mg total) by mouth at bedtime.   topiramate (TOPAMAX) 50 MG tablet Take 2 tablets (100 mg total) by mouth daily.   No facility-administered encounter medications on file as of 06/15/2022.    Surgical History: Past Surgical History:  Procedure Laterality Date   ABDOMINAL  HYSTERECTOMY     BREAST BIOPSY Left 2014   neg   FOOT SURGERY Right    2009   LAPAROSCOPIC APPENDECTOMY  12/05/2012   NASAL SEPTUM SURGERY     [place] PROCEDURE  03/03/2012   Thyroglossal duct cyst excision    Medical History: Past Medical History:  Diagnosis Date   Allergic rhinitis    Anemia    Anxiety    Gallbladder polyp    Migraines     Family History: Family History  Adopted: Yes    Social History   Socioeconomic History   Marital status: Married    Spouse name: Not on file   Number of children: Not on file   Years of education: Not on file   Highest education level: Not on file  Occupational History   Not on file  Tobacco Use   Smoking status: Former    Types: Cigarettes    Quit date: 06/26/2021    Years since quitting: 1.0   Smokeless tobacco: Never   Tobacco comments:    8-10 a day  Substance and Sexual Activity   Alcohol use: Never   Drug use: Never   Sexual activity: Not on file  Other Topics Concern   Not on file  Social History Narrative   Not on file   Social Determinants of Health   Financial Resource Strain: Not on file  Food Insecurity: Not on file  Transportation  Needs: Not on file  Physical Activity: Not on file  Stress: Not on file  Social Connections: Not on file  Intimate Partner Violence: Not on file      Review of Systems  Constitutional:  Negative for chills, fatigue and unexpected weight change.  HENT:  Positive for trouble swallowing. Negative for congestion, rhinorrhea, sneezing and sore throat.   Eyes:  Negative for redness.  Respiratory:  Negative for cough, chest tightness and shortness of breath.   Cardiovascular:  Negative for chest pain and palpitations.  Gastrointestinal:  Negative for abdominal pain, constipation, diarrhea, nausea and vomiting.  Genitourinary:  Negative for dysuria and frequency.  Musculoskeletal:  Positive for arthralgias. Negative for back pain, joint swelling and neck pain.  Skin:   Negative for rash.  Neurological:  Negative for tremors and numbness.  Hematological:  Negative for adenopathy. Does not bruise/bleed easily.  Psychiatric/Behavioral:  Negative for behavioral problems (Depression), sleep disturbance and suicidal ideas. The patient is nervous/anxious.     Vital Signs: BP 138/70 Comment: 146/77  Pulse 87   Temp 98.3 F (36.8 C)   Resp 16   Ht 5' 5.5" (1.664 m)   Wt 128 lb 12.8 oz (58.4 kg)   SpO2 98%   BMI 21.11 kg/m    Physical Exam Vitals and nursing note reviewed.  Constitutional:      General: She is not in acute distress.    Appearance: Normal appearance. She is well-developed and normal weight. She is not diaphoretic.  HENT:     Head: Normocephalic and atraumatic.     Mouth/Throat:     Pharynx: No oropharyngeal exudate.  Eyes:     Pupils: Pupils are equal, round, and reactive to light.  Neck:     Thyroid: No thyromegaly.     Vascular: No JVD.     Trachea: No tracheal deviation.  Cardiovascular:     Rate and Rhythm: Normal rate and regular rhythm.     Heart sounds: Normal heart sounds. No murmur heard.    No friction rub. No gallop.  Pulmonary:     Effort: Pulmonary effort is normal. No respiratory distress.     Breath sounds: No wheezing or rales.  Chest:     Chest wall: No tenderness.  Abdominal:     General: Bowel sounds are normal.     Palpations: Abdomen is soft.  Musculoskeletal:        General: Normal range of motion.     Cervical back: Normal range of motion and neck supple.  Lymphadenopathy:     Cervical: No cervical adenopathy.  Skin:    General: Skin is warm and dry.  Neurological:     Mental Status: She is alert and oriented to person, place, and time.     Cranial Nerves: No cranial nerve deficit.  Psychiatric:        Behavior: Behavior normal.        Thought Content: Thought content normal.        Judgment: Judgment normal.        Assessment/Plan: 1. Persistent migraine aura without cerebral  infarction and without status migrainosus, not intractable May continue topamax as before - topiramate (TOPAMAX) 50 MG tablet; Take 2 tablets (100 mg total) by mouth daily.  Dispense: 180 tablet; Refill: 3  2. Dysphagia, unspecified type Will check swallow study - DG SWALLOW FUNC SPEECH PATH; Future  3. Muscle spasms of neck May continue 1/2 tab as needed - carisoprodol (SOMA) 350 MG tablet; Take 1  tablet (350 mg total) by mouth at bedtime.  Dispense: 90 tablet; Refill: 0  4. Folate deficiency Will update labs  5. Pure hypercholesterolemia - Lipid Panel With LDL/HDL Ratio  6. B12 deficiency - B12 and Folate Panel  7. Vitamin D deficiency - VITAMIN D 25 Hydroxy (Vit-D Deficiency, Fractures)  8. Other fatigue - CBC w/Diff/Platelet - Comprehensive metabolic panel - TSH + free T4 - Lipid Panel With LDL/HDL Ratio - VITAMIN D 25 Hydroxy (Vit-D Deficiency, Fractures) - B12 and Folate Panel - Fe+TIBC+Fer  9. Dysuria - POCT Urinalysis Dipstick - CULTURE, URINE COMPREHENSIVE   General Counseling: Shalece verbalizes understanding of the findings of todays visit and agrees with plan of treatment. I have discussed any further diagnostic evaluation that may be needed or ordered today. We also reviewed her medications today. she has been encouraged to call the office with any questions or concerns that should arise related to todays visit.    Orders Placed This Encounter  Procedures   CULTURE, URINE COMPREHENSIVE   DG SWALLOW FUNC SPEECH PATH   CBC w/Diff/Platelet   Comprehensive metabolic panel   TSH + free T4   Lipid Panel With LDL/HDL Ratio   VITAMIN D 25 Hydroxy (Vit-D Deficiency, Fractures)   B12 and Folate Panel   Fe+TIBC+Fer   POCT Urinalysis Dipstick    Meds ordered this encounter  Medications   topiramate (TOPAMAX) 50 MG tablet    Sig: Take 2 tablets (100 mg total) by mouth daily.    Dispense:  180 tablet    Refill:  3    Requesting 1 year supply   carisoprodol  (SOMA) 350 MG tablet    Sig: Take 1 tablet (350 mg total) by mouth at bedtime.    Dispense:  90 tablet    Refill:  0    This patient was seen by Lynn Ito, PA-C in collaboration with Dr. Beverely Risen as a part of collaborative care agreement.   Total time spent:30 Minutes Time spent includes review of chart, medications, test results, and follow up plan with the patient.      Dr Lyndon Code Internal medicine

## 2022-06-22 ENCOUNTER — Telehealth: Payer: Self-pay

## 2022-06-22 LAB — CULTURE, URINE COMPREHENSIVE

## 2022-06-26 ENCOUNTER — Telehealth: Payer: Self-pay

## 2022-06-26 ENCOUNTER — Other Ambulatory Visit: Payer: Self-pay

## 2022-06-26 MED ORDER — NITROFURANTOIN MONOHYD MACRO 100 MG PO CAPS: 100.0000 mg | ORAL_CAPSULE | Freq: Two times a day (BID) | ORAL | 0 refills | Status: DC

## 2022-06-26 NOTE — Telephone Encounter (Signed)
error 

## 2022-06-26 NOTE — Telephone Encounter (Signed)
Pt advised that we send Macrobid

## 2022-06-27 ENCOUNTER — Ambulatory Visit
Admission: RE | Admit: 2022-06-27 | Discharge: 2022-06-27 | Disposition: A | Discharge status: Home / Self Care | Payer: Managed Care, Other (non HMO) | Source: Ambulatory Visit | Attending: Physician Assistant | Admitting: Physician Assistant

## 2022-06-27 DIAGNOSIS — R1310 Dysphagia, unspecified: Secondary | ICD-10-CM | POA: Diagnosis not present

## 2022-06-27 NOTE — Therapy (Signed)
[facility] [place], [place], [address] Phone: [phone]   Fax:     Modified Barium Swallow  Patient Details  Name: Barbara [name] MRN: [id number] Date of Birth: 1961/02/06 No data recorded  Encounter Date: 06/27/2022   End of Session - 06/27/22 1322     Visit Number 1    Number of Visits 1    Date for SLP Re-Evaluation 06/27/22    SLP Start Time 4665    SLP Stop Time  1310    SLP Time Calculation (min) 35 min    Activity Tolerance Patient tolerated treatment well             Past Medical History:  Diagnosis Date   Allergic rhinitis    Anemia    Anxiety    Gallbladder polyp    Migraines     Past Surgical History:  Procedure Laterality Date   ABDOMINAL HYSTERECTOMY     BREAST BIOPSY Left 2014   neg   FOOT SURGERY Right    2009   LAPAROSCOPIC APPENDECTOMY  12/05/2012   NASAL SEPTUM SURGERY     [place] PROCEDURE  03/03/2012   Thyroglossal duct cyst excision    There were no vitals filed for this visit.    06/27/22 1300  SLP Visit Information  SLP Received On 06/27/22  Subjective  Subjective pt reports sensation of food sticking on right side, approximately level of thyroid notch  Patient/Family Stated Goal see if her throat is narrower on the right side  Pain Assessment  Pain Assessment No/denies pain  General Information  Date of Onset 06/14/22 (referral date; pt reports onset 2-3 months ago)  Type of Study MBS-Modified Barium Swallow Study  Previous Swallow Assessment none on file  Diet Prior to this Study Regular;Thin liquids  Temperature Spikes Noted No  Respiratory Status Room air  History of Recent Intubation No  Behavior/Cognition Alert;Cooperative  Oral Cavity Assessment WFL  Oral Cavity - Dentition Adequate natural dentition  Vision Functional for self feeding  Self-Feeding Abilities Able to feed self  Patient Positioning Upright in chair  Baseline Vocal  Quality Normal  Volitional Cough Strong  Volitional Swallow Able to elicit  Anatomy [facility]  Pharyngeal Secretions Not observed secondary MBS  Oral Motor/Sensory Function  Overall Oral Motor/Sensory Function WFL  Oral Preparation/Oral Phase  Oral Phase WFL  Pharyngeal Phase  Pharyngeal Phase WFL  Cervical Esophageal Phase  Cervical Esophageal Phase [facility]  Clinical Impression  Clinical Impression Patient presents with oropharygneal swallow that appears grossly within functional limits. Oral stage is characterized by rotary mastication, adequate bolus formation,  and adequate oral containment. Patient piecemeals boluses intermittently (pudding, larger sips of liquids), which appears to be behavioral rather than weakness. Swallow initiation is timely at the level of the base of tongue. Pharyngeal stage characterized by adequate tongue base retraction, hyolaryngeal excursion and pharyngeal constriction. Note presence of curved epiglottis which appears within normal range, but nonetheless intermittently traps a mild amount of residue, typically in the valleculae. There was no laryngeal penetration or aspiration observed on this examination. 17m barium tablet briefly lodged between the epiglottis and posterior pharyngeal wall, but cleared with additional sip of liquid. Throughout the exam pt reported sensation of "narrowing" of her right lateral channel, however no trapping of residue was seen in this space, and anterior-posterior view showed symmetrical transit of bolus. Attempted right head turn to facilitate left channel passage with pt reporting improvement in her sensation. Cervical  esophageal phase generally unremarkable, although there was a small amount of residue/trace backflow noted x1 which did not reach the pharynx near the end of the study. Patient was educated using study images on function and provided with written recommendations. Recommend she continue current (regular) diet with thin liquids.  She may find greater ease/comfort in taking pills with puree, or using right head turn to reduce her right-sided pharyngeal sensation. If difficulties persist, could consider barium swallow for assessment of esophageal function. No further skilled ST recommended at this time.  SLP Visit Diagnosis Dysphagia, unspecified (R13.10)  Impact on safety and function Mild aspiration risk  Swallow Evaluation Recommendations  SLP Diet Recommendations Regular solids;Thin liquid  Liquid Administration via Cup;Straw  Medication Administration Whole meds with puree  Supervision Patient able to self feed  Compensations Slow rate;Small sips/bites;Other (Comment) (extra swallow, liquid wash, head turn right if necessary to reduce sensation)  Postural Changes Remain semi-upright after after feeds/meals (Comment);Seated upright at 90 degrees  Treatment Plan  Oral Care Recommendations Oral care BID  Treatment Recommendations No treatment recommended at this time  Follow Up Recommendations No SLP follow up  Prognosis  Prognosis for Safe Diet Advancement Good  Individuals Consulted  Consulted and Agree with Results and Recommendations Patient  Report Sent to  Referring physician  Progression Toward Goals  Progression toward goals Goals met, education completed, patient discharged from SLP  SLP Time Calculation  SLP Start Time (ACUTE ONLY) 1235  SLP Stop Time (ACUTE ONLY) 1310  SLP Time Calculation (min) (ACUTE ONLY) 35 min  SLP Evaluations  $ SLP Speech Visit 1 Visit  SLP Evaluations  $Outpatient MBS Swallow 1 Procedure   Dysphagia, unspecified type - Plan: DG SWALLOW FUNC SPEECH PATH, DG SWALLOW [facility] SPEECH PATH        Problem List Patient Active Problem List   Diagnosis Date Noted   Dysuria 11/07/2020   Urinary tract infection without hematuria 11/18/2019   Encounter for general adult medical examination with abnormal findings 09/29/2019   Bilateral carotid artery stenosis 09/29/2019   Acute  non-recurrent pansinusitis 06/15/2019   Close exposure to COVID-19 virus 06/15/2019   Screening for breast cancer 09/25/2018   Persistent migraine aura without cerebral infarction and without status migrainosus, not intractable 09/25/2018   Muscle spasms of neck 09/25/2018   Cough 09/25/2018   Cigarette nicotine dependence without complication 15/37/9432   Bilateral carotid bruits 09/25/2018   Primary generalized hypertrophic osteoarthrosis 09/25/2018   Depression, major, single episode, in partial remission ([place]) 09/25/2018   Estrogen deficiency 09/25/2018   Other fatigue 09/25/2018   Deneise Lever, MS, CCC-SLP Speech-Language Pathologist [phone]   Aliene Al[place], [place] 06/27/2022, 2:22 PM  [place], [place], [address] Phone: [phone]   Fax:     Name: Barbara [name] MRN: [id number] Date of Birth: 04/18/1961

## 2022-07-05 ENCOUNTER — Ambulatory Visit: Payer: Managed Care, Other (non HMO) | Admitting: Physician Assistant

## 2022-09-28 ENCOUNTER — Telehealth: Payer: Self-pay | Admitting: Physician Assistant

## 2022-09-28 ENCOUNTER — Other Ambulatory Visit: Payer: Self-pay | Admitting: Physician Assistant

## 2022-09-28 DIAGNOSIS — R928 Other abnormal and inconclusive findings on diagnostic imaging of breast: Secondary | ICD-10-CM

## 2022-10-02 NOTE — Telephone Encounter (Signed)
Error

## 2022-10-04 ENCOUNTER — Other Ambulatory Visit: Payer: Self-pay | Admitting: Physician Assistant

## 2022-10-05 LAB — CBC WITH DIFFERENTIAL/PLATELET
Basophils Absolute: 0 10*3/uL (ref 0.0–0.2)
Basos: 1 %
EOS (ABSOLUTE): 0.4 10*3/uL (ref 0.0–0.4)
Eos: 6 %
Hematocrit: 38.7 % (ref 34.0–46.6)
Hemoglobin: 13.1 g/dL (ref 11.1–15.9)
Immature Grans (Abs): 0 10*3/uL (ref 0.0–0.1)
Immature Granulocytes: 0 %
Lymphocytes Absolute: 2.3 10*3/uL (ref 0.7–3.1)
Lymphs: 39 %
MCH: 32.8 pg (ref 26.6–33.0)
MCHC: 33.9 g/dL (ref 31.5–35.7)
MCV: 97 fL (ref 79–97)
Monocytes Absolute: 0.5 10*3/uL (ref 0.1–0.9)
Monocytes: 9 %
Neutrophils Absolute: 2.8 10*3/uL (ref 1.4–7.0)
Neutrophils: 45 %
Platelets: 278 10*3/uL (ref 150–450)
RBC: 4 x10E6/uL (ref 3.77–5.28)
RDW: 12.3 % (ref 11.7–15.4)
WBC: 6.1 10*3/uL (ref 3.4–10.8)

## 2022-10-05 LAB — COMPREHENSIVE METABOLIC PANEL
ALT: 7 IU/L (ref 0–32)
AST: 13 IU/L (ref 0–40)
Albumin/Globulin Ratio: 1.9 (ref 1.2–2.2)
Albumin: 4.5 g/dL (ref 3.9–4.9)
Alkaline Phosphatase: 39 IU/L — ABNORMAL LOW (ref 44–121)
BUN/Creatinine Ratio: 17 (ref 12–28)
BUN: 22 mg/dL (ref 8–27)
Bilirubin Total: 0.4 mg/dL (ref 0.0–1.2)
CO2: 21 mmol/L (ref 20–29)
Calcium: 9.3 mg/dL (ref 8.7–10.3)
Chloride: 105 mmol/L (ref 96–106)
Creatinine, Ser: 1.26 mg/dL — ABNORMAL HIGH (ref 0.57–1.00)
Globulin, Total: 2.4 g/dL (ref 1.5–4.5)
Glucose: 81 mg/dL (ref 70–99)
Potassium: 4.4 mmol/L (ref 3.5–5.2)
Sodium: 140 mmol/L (ref 134–144)
Total Protein: 6.9 g/dL (ref 6.0–8.5)
eGFR: 49 mL/min/{1.73_m2} — ABNORMAL LOW (ref >59)

## 2022-10-05 LAB — LIPID PANEL WITH LDL/HDL RATIO
Cholesterol, Total: 215 mg/dL — ABNORMAL HIGH (ref 100–199)
HDL: 67 mg/dL (ref >39)
LDL Chol Calc (NIH): 129 mg/dL — ABNORMAL HIGH (ref 0–99)
LDL/HDL Ratio: 1.9 ratio (ref 0.0–3.2)
Triglycerides: 109 mg/dL (ref 0–149)
VLDL Cholesterol Cal: 19 mg/dL (ref 5–40)

## 2022-10-05 LAB — B12 AND FOLATE PANEL
Folate: 5 ng/mL (ref >3.0)
Vitamin B-12: 295 pg/mL (ref 232–1245)

## 2022-10-05 LAB — IRON,TIBC AND FERRITIN PANEL
Ferritin: 148 ng/mL (ref 15–150)
Iron Saturation: 47 % (ref 15–55)
Iron: 103 ug/dL (ref 27–139)
Total Iron Binding Capacity: 217 ug/dL — ABNORMAL LOW (ref 250–450)
UIBC: 114 ug/dL — ABNORMAL LOW (ref 118–369)

## 2022-10-05 LAB — TSH+FREE T4
Free T4: 1.32 ng/dL (ref 0.82–1.77)
TSH: 2.65 u[IU]/mL (ref 0.450–4.500)

## 2022-10-05 LAB — VITAMIN D 25 HYDROXY (VIT D DEFICIENCY, FRACTURES): Vit D, 25-Hydroxy: 72.1 ng/mL (ref 30.0–100.0)

## 2022-10-11 ENCOUNTER — Encounter: Payer: Managed Care, Other (non HMO) | Admitting: Physician Assistant

## 2022-10-16 ENCOUNTER — Ambulatory Visit (INDEPENDENT_AMBULATORY_CARE_PROVIDER_SITE_OTHER): Payer: Managed Care, Other (non HMO) | Admitting: Nurse Practitioner

## 2022-10-16 ENCOUNTER — Encounter: Payer: Self-pay | Admitting: Nurse Practitioner

## 2022-10-16 VITALS — BP 128/70 | HR 73 | Temp 97.6°F | Resp 16 | Ht 65.5 in | Wt 126.4 lb

## 2022-10-16 DIAGNOSIS — I6523 Occlusion and stenosis of bilateral carotid arteries: Secondary | ICD-10-CM | POA: Diagnosis not present

## 2022-10-16 DIAGNOSIS — E538 Deficiency of other specified B group vitamins: Secondary | ICD-10-CM

## 2022-10-16 DIAGNOSIS — Z0001 Encounter for general adult medical examination with abnormal findings: Secondary | ICD-10-CM

## 2022-10-16 DIAGNOSIS — R300 Dysuria: Secondary | ICD-10-CM | POA: Diagnosis not present

## 2022-10-16 MED ORDER — "SYRINGE 25G X 5/8"" 3 ML MISC": 1.0000 | 3 refills | Status: DC

## 2022-10-16 MED ORDER — CYANOCOBALAMIN 1000 MCG/ML IJ SOLN: INTRAMUSCULAR | 3 refills | Status: DC

## 2022-10-16 NOTE — Progress Notes (Signed)
[facility] [address] [place], [place] [address]  Internal MEDICINE  Office Visit Note  Patient Name: Barbara [name]  [id number]  [id number]  Date of Service: 10/16/2022  Chief Complaint  Patient presents with   Annual Exam   Quality Metric Gaps    TDAP and Shingles    HPI [name] presents for an annual well visit and physical exam.  Well-appearing 61 y.o. female with carotid artery stenosis, migraines, osteoarthritis and hx of depression.  Routine CRC screening: due December 2024, wants to postpone Routine mammogram: scheduled for tomorrow, 10/17/22 Pap smear: due in December 2024 Labs: no significant change in cholesterol levels, B12 borderline low 295,  Slight decrease in kidney function GFR 49, cre 1.26 --urinary incontinence - new problem, will discuss at follow up with Lauren PA-C next week Fluctuating BP, but is normal today New or worsening pain: none Wellbutrin stopped per patient not taking anymore, felt irritable while on it.      10/16/2022    3:20 PM  Depression screen PHQ 2/9  Decreased Interest 0  Down, Depressed, Hopeless 0  PHQ - 2 Score 0      Current Medication: Outpatient Encounter Medications as of 10/16/2022  Medication Sig   albuterol (VENTOLIN HFA) 108 (90 Base) MCG/ACT inhaler TAKE 2 PUFFS BY MOUTH EVERY 6 HOURS AS NEEDED FOR WHEEZE OR SHORTNESS OF BREATH   carisoprodol (SOMA) 350 MG tablet Take 1 tablet (350 mg total) by mouth at bedtime.   celecoxib (CELEBREX) 200 MG capsule TAKE 1 CAPSULE BY MOUTH  DAILY   estradiol (ESTRACE) 1 MG tablet TAKE 1 TABLET BY MOUTH  DAILY   nitrofurantoin, macrocrystal-monohydrate, (MACROBID) 100 MG capsule Take 1 capsule (100 mg total) by mouth 2 (two) times daily.   Syringe/Needle, Disp, (SYRINGE 3CC/25GX5/8") 25G X 5/8" 3 ML MISC 1 Device by Does not apply route every 30 (thirty) days. Use with IM B12   topiramate (TOPAMAX) 50 MG tablet Take 2 tablets (100 mg total) by mouth daily.    [DISCONTINUED] buPROPion ER (WELLBUTRIN SR) 100 MG 12 hr tablet TAKE 1 TABLET BY MOUTH IN THE  MORNING FOR 1 WEEK, THEN 2  TABLETS ONCE DAILY IN THE  MORNING AFTERWARDS.   [DISCONTINUED] cyanocobalamin (,VITAMIN B-12,) 1000 MCG/ML injection Inject 1 Ml (1,000 mcg total)into the muscle once a month   cyanocobalamin (VITAMIN B12) 1000 MCG/ML injection Inject 1 Ml (1,000 mcg total)into the muscle once a month   No facility-administered encounter medications on file as of 10/16/2022.    Surgical History: Past Surgical History:  Procedure Laterality Date   ABDOMINAL HYSTERECTOMY     BREAST BIOPSY Left 2014   neg   FOOT SURGERY Right    2009   LAPAROSCOPIC APPENDECTOMY  12/05/2012   NASAL SEPTUM SURGERY     SISTRUNK PROCEDURE  03/03/2012   Thyroglossal duct cyst excision    Medical History: Past Medical History:  Diagnosis Date   Allergic rhinitis    Anemia    Anxiety    Gallbladder polyp    Migraines     Family History: Family History  Adopted: Yes    Social History   Socioeconomic History   Marital status: Married    Spouse name: Not on file   Number of children: Not on file   Years of education: Not on file   Highest education level: Not on file  Occupational History   Not on file  Tobacco Use   Smoking status: Former  Types: Cigarettes    Quit date: 06/26/2021    Years since quitting: 1.3   Smokeless tobacco: Never   Tobacco comments:    8-10 a day. Smokes when stressed.  Substance and Sexual Activity   Alcohol use: Never   Drug use: Never   Sexual activity: Not on file  Other Topics Concern   Not on file  Social History Narrative   Not on file   Social Determinants of Health   Financial Resource Strain: Not on file  Food Insecurity: Not on file  Transportation Needs: Not on file  Physical Activity: Not on file  Stress: Not on file  Social Connections: Not on file  Intimate Partner Violence: Not on file      Review of Systems  Constitutional:   Negative for chills, fatigue and unexpected weight change.  HENT:  Negative for congestion, rhinorrhea, sneezing and sore throat.   Eyes:  Negative for redness.  Respiratory:  Negative for cough, chest tightness and shortness of breath.   Cardiovascular:  Negative for chest pain and palpitations.  Gastrointestinal:  Negative for abdominal pain, constipation, diarrhea, nausea and vomiting.  Genitourinary:  Negative for dysuria and frequency.       Urinary incontinence  Musculoskeletal:  Positive for arthralgias. Negative for back pain, joint swelling and neck pain.  Skin:  Negative for rash.  Neurological: Negative.  Negative for tremors and numbness.  Hematological:  Negative for adenopathy. Does not bruise/bleed easily.  Psychiatric/Behavioral:  Negative for behavioral problems (Depression), sleep disturbance and suicidal ideas. The patient is nervous/anxious.     Vital Signs: BP 128/70   Pulse 73   Temp 97.6 F (36.4 C)   Resp 16   Ht 5' 5.5" (1.664 m)   Wt 126 lb 6.4 oz (57.3 kg)   SpO2 99%   BMI 20.71 kg/m    Physical Exam Vitals and nursing note reviewed.  Constitutional:      General: She is not in acute distress.    Appearance: Normal appearance. She is well-developed and normal weight. She is not ill-appearing or diaphoretic.  HENT:     Head: Normocephalic and atraumatic.     Right Ear: Tympanic membrane, ear canal and external ear normal.     Left Ear: Tympanic membrane, ear canal and external ear normal.     Nose: Nose normal. No congestion or rhinorrhea.     Mouth/Throat:     Mouth: Mucous membranes are moist.     Pharynx: Oropharynx is clear. No oropharyngeal exudate or posterior oropharyngeal erythema.  Eyes:     General: No scleral icterus.       Right eye: No discharge.        Left eye: No discharge.     Extraocular Movements: Extraocular movements intact.     Conjunctiva/sclera: Conjunctivae normal.     Pupils: Pupils are equal, round, and reactive to  light.  Neck:     Thyroid: No thyromegaly.     Vascular: No JVD.     Trachea: No tracheal deviation.  Cardiovascular:     Rate and Rhythm: Normal rate and regular rhythm.     Pulses: Normal pulses.     Heart sounds: Normal heart sounds. No murmur heard.    No friction rub. No gallop.  Pulmonary:     Effort: Pulmonary effort is normal. No respiratory distress.     Breath sounds: Normal breath sounds. No stridor. No wheezing or rales.  Chest:     Chest wall: No  tenderness.  Abdominal:     General: Bowel sounds are normal. There is no distension.     Palpations: Abdomen is soft. There is no mass.     Tenderness: There is no abdominal tenderness. There is no guarding or rebound.  Musculoskeletal:        General: No tenderness or deformity. Normal range of motion.     Cervical back: Normal range of motion and neck supple.  Lymphadenopathy:     Cervical: No cervical adenopathy.  Skin:    General: Skin is warm and dry.     Capillary Refill: Capillary refill takes less than 2 seconds.     Coloration: Skin is not pale.     Findings: No erythema or rash.  Neurological:     Mental Status: She is alert and oriented to person, place, and time. Mental status is at baseline.     Cranial Nerves: No cranial nerve deficit.     Motor: No abnormal muscle tone.     Coordination: Coordination normal.     Deep Tendon Reflexes: Reflexes are normal and symmetric.  Psychiatric:        Mood and Affect: Mood normal.        Behavior: Behavior normal.        Thought Content: Thought content normal.        Judgment: Judgment normal.        Assessment/Plan: 1. Encounter for routine adult health examination with abnormal findings Age-appropriate preventive screenings and vaccinations discussed, annual physical exam completed. Routine labs for health maintenance drawn prior to office visit, results discussed with patient today. PHM updated.   2. Bilateral carotid artery stenosis Carotid ultrasound  ordered for january - US Carotid Bilateral; Future  3. B12 deficiency Start monthly B12 injections, level is 295 - cyanocobalamin (VITAMIN B12) 1000 MCG/ML injection; Inject 1 Ml (1,000 mcg total)into the muscle once a month  Dispense: 1 mL; Refill: 3 - Syringe/Needle, Disp, (SYRINGE 3CC/25GX5/8") 25G X 5/8" 3 ML MISC; 1 Device by Does not apply route every 30 (thirty) days. Use with IM B12  Dispense: 1 each; Refill: 3  4. Dysuria Routine urinalysis done - UA/M w/rflx Culture, Routine      General Counseling: Aleza verbalizes understanding of the findings of todays visit and agrees with plan of treatment. I have discussed any further diagnostic evaluation that may be needed or ordered today. We also reviewed her medications today. she has been encouraged to call the office with any questions or concerns that should arise related to todays visit.    Orders Placed This Encounter  Procedures   US Carotid Bilateral   UA/M w/rflx Culture, Routine    Meds ordered this encounter  Medications   cyanocobalamin (VITAMIN B12) 1000 MCG/ML injection    Sig: Inject 1 Ml (1,000 mcg total)into the muscle once a month    Dispense:  1 mL    Refill:  3   Syringe/Needle, Disp, (SYRINGE 3CC/25GX5/8") 25G X 5/8" 3 ML MISC    Sig: 1 Device by Does not apply route every 30 (thirty) days. Use with IM B12    Dispense:  1 each    Refill:  3    Please supply syringe with B12 IM vial thank you    Return in about 9 days (around 10/25/2022) for previously scheduled, F/U with Hamilton Capri PCP.   Total time spent:30 Minutes Time spent includes review of chart, medications, test results, and follow up plan with the patient.  [place] Controlled Substance Database was reviewed by me.  This patient was seen by Jonetta Osgood, FNP-C in collaboration with Dr. Clayborn Bigness as a part of collaborative care agreement.  Sheba Whaling R. Valetta Fuller, MSN, FNP-C Internal medicine

## 2022-10-17 ENCOUNTER — Telehealth: Payer: Self-pay | Admitting: Nurse Practitioner

## 2022-10-17 ENCOUNTER — Ambulatory Visit
Admission: RE | Admit: 2022-10-17 | Discharge: 2022-10-17 | Disposition: A | Discharge status: Home / Self Care | Payer: Managed Care, Other (non HMO) | Source: Ambulatory Visit | Attending: Physician Assistant | Admitting: Physician Assistant

## 2022-10-17 DIAGNOSIS — R928 Other abnormal and inconclusive findings on diagnostic imaging of breast: Secondary | ICD-10-CM | POA: Diagnosis present

## 2022-10-17 NOTE — Telephone Encounter (Signed)
error 

## 2022-10-19 LAB — UA/M W/RFLX CULTURE, ROUTINE
Bilirubin, UA: NEGATIVE
Glucose, UA: NEGATIVE
Ketones, UA: NEGATIVE
Nitrite, UA: POSITIVE — AB
Specific Gravity, UA: 1.015 (ref 1.005–1.030)
Urobilinogen, Ur: 1 mg/dL (ref 0.2–1.0)
pH, UA: 7 (ref 5.0–7.5)

## 2022-10-19 LAB — URINE CULTURE, REFLEX

## 2022-10-19 LAB — MICROSCOPIC EXAMINATION
Casts: NONE SEEN /lpf
WBC, UA: >30 /hpf — AB (ref 0–5)

## 2022-10-25 ENCOUNTER — Encounter: Payer: Self-pay | Admitting: Physician Assistant

## 2022-10-25 ENCOUNTER — Ambulatory Visit: Payer: Managed Care, Other (non HMO) | Admitting: Physician Assistant

## 2022-10-25 VITALS — BP 130/77 | HR 73 | Temp 98.5°F | Resp 16 | Ht 65.5 in | Wt 125.6 lb

## 2022-10-25 DIAGNOSIS — F411 Generalized anxiety disorder: Secondary | ICD-10-CM

## 2022-10-25 DIAGNOSIS — N3000 Acute cystitis without hematuria: Secondary | ICD-10-CM | POA: Diagnosis not present

## 2022-10-25 DIAGNOSIS — R030 Elevated blood-pressure reading, without diagnosis of hypertension: Secondary | ICD-10-CM

## 2022-10-25 MED ORDER — NITROFURANTOIN MONOHYD MACRO 100 MG PO CAPS: 100.0000 mg | ORAL_CAPSULE | Freq: Two times a day (BID) | ORAL | 0 refills | Status: DC

## 2022-10-25 MED ORDER — ESCITALOPRAM OXALATE 5 MG PO TABS: 5.0000 mg | ORAL_TABLET | Freq: Every day | ORAL | 1 refills | Status: DC

## 2022-10-25 NOTE — Progress Notes (Signed)
[facility] [address] [place], [place] [address]  Internal MEDICINE  Office Visit Note  Patient Name: Barbara [name]  [id number]  [id number]  Date of Service: 10/26/2022  Chief Complaint  Patient presents with   Follow-up    Review mammogram    HPI Pt is here for routine follow up with a few concerns -Diagnostic Mammogram w/ [place] reviewed and were normal. Previous abnormal findings resolved -Urinary incontinence recently, wearing a pad all the time. Previously treated for UTI when this started about 49months ago however culture did not actually show growth then. -Some lower abdominal pressure, a little contraction feeling, no burning. Can happen anytime. Not just occurring with coughing, sneezing, or laughing. Sudden start not gradual. Recent urine at CPE did result with UTI that was confirmed on culture and require treatment. Will go ahead and treat now, however if incontinence doesn't resolve would order pelvic US for further investigation to sudden cause and consider medication to help symptoms. -Some high BP at home, notices some SOB and chest tightness when this happens. Usually goes away within of relaxing.  -Avg 130-140s/70s. Feels symptoms when it goes up to 150-160s/80s. This happens 1-2 times per week.  -wellbutrin and lexapro stopped. Felt more irritable on wellbutrin and didn't really notice any change with lexapro, however BP fluctuations did arise when lexapro stopped and may be due to anxiety. Will restart Lexapro at lower dose and she will continue to monitor BP. Could consider low dose BP med if still occurring.  Current Medication: Outpatient Encounter Medications as of 10/25/2022  Medication Sig   albuterol (VENTOLIN HFA) 108 (90 Base) MCG/ACT inhaler TAKE 2 PUFFS BY MOUTH EVERY 6 HOURS AS NEEDED FOR WHEEZE OR SHORTNESS OF BREATH   carisoprodol (SOMA) 350 MG tablet Take 1 tablet (350 mg total) by mouth at bedtime.   celecoxib (CELEBREX) 200 MG  capsule TAKE 1 CAPSULE BY MOUTH  DAILY   cyanocobalamin (VITAMIN B12) 1000 MCG/ML injection Inject 1 Ml (1,000 mcg total)into the muscle once a month   escitalopram (LEXAPRO) 5 MG tablet Take 1 tablet (5 mg total) by mouth daily.   estradiol (ESTRACE) 1 MG tablet TAKE 1 TABLET BY MOUTH  DAILY   Syringe/Needle, Disp, (SYRINGE 3CC/25GX5/8") 25G X 5/8" 3 ML MISC 1 Device by Does not apply route every 30 (thirty) days. Use with IM B12   topiramate (TOPAMAX) 50 MG tablet Take 2 tablets (100 mg total) by mouth daily.   [DISCONTINUED] nitrofurantoin, macrocrystal-monohydrate, (MACROBID) 100 MG capsule Take 1 capsule (100 mg total) by mouth 2 (two) times daily.   nitrofurantoin, macrocrystal-monohydrate, (MACROBID) 100 MG capsule Take 1 capsule (100 mg total) by mouth 2 (two) times daily.   No facility-administered encounter medications on file as of 10/25/2022.    Surgical History: Past Surgical History:  Procedure Laterality Date   ABDOMINAL HYSTERECTOMY     BREAST BIOPSY Left 2014   neg   FOOT SURGERY Right    2009   LAPAROSCOPIC APPENDECTOMY  12/05/2012   NASAL SEPTUM SURGERY     SISTRUNK PROCEDURE  03/03/2012   Thyroglossal duct cyst excision    Medical History: Past Medical History:  Diagnosis Date   Allergic rhinitis    Anemia    Anxiety    Gallbladder polyp    Migraines     Family History: Family History  Adopted: Yes    Social History   Socioeconomic History   Marital status: Married    Spouse name: Not on  file   Number of children: Not on file   Years of education: Not on file   Highest education level: Not on file  Occupational History   Not on file  Tobacco Use   Smoking status: Former    Types: Cigarettes    Quit date: 06/26/2021    Years since quitting: 1.3   Smokeless tobacco: Never   Tobacco comments:    8-10 a day. Smokes when stressed.  Substance and Sexual Activity   Alcohol use: Never   Drug use: Never   Sexual activity: Not on file  Other Topics  Concern   Not on file  Social History Narrative   Not on file   Social Determinants of Health   Financial Resource Strain: Not on file  Food Insecurity: Not on file  Transportation Needs: Not on file  Physical Activity: Not on file  Stress: Not on file  Social Connections: Not on file  Intimate Partner Violence: Not on file      Review of Systems  Constitutional:  Negative for chills, fatigue and unexpected weight change.  HENT:  Negative for congestion, rhinorrhea, sneezing and sore throat.   Eyes:  Negative for redness.  Respiratory:  Negative for cough, chest tightness and shortness of breath.   Cardiovascular:  Negative for chest pain and palpitations.  Gastrointestinal:  Negative for abdominal pain, constipation, diarrhea, nausea and vomiting.  Genitourinary:  Negative for dysuria and frequency.       Urinary incontinence  Musculoskeletal:  Positive for arthralgias. Negative for back pain, joint swelling and neck pain.  Skin:  Negative for rash.  Neurological: Negative.  Negative for tremors and numbness.  Hematological:  Negative for adenopathy. Does not bruise/bleed easily.  Psychiatric/Behavioral:  Negative for behavioral problems (Depression), sleep disturbance and suicidal ideas. The patient is nervous/anxious.     Vital Signs: BP 130/77   Pulse 73   Temp 98.5 F (36.9 C)   Resp 16   Ht 5' 5.5" (1.664 m)   Wt 125 lb 9.6 oz (57 kg)   SpO2 98%   BMI 20.58 kg/m    Physical Exam Vitals and nursing note reviewed.  Constitutional:      General: She is not in acute distress.    Appearance: Normal appearance. She is well-developed and normal weight. She is not diaphoretic.  HENT:     Head: Normocephalic and atraumatic.     Mouth/Throat:     Pharynx: No oropharyngeal exudate.  Eyes:     Pupils: Pupils are equal, round, and reactive to light.  Neck:     Thyroid: No thyromegaly.     Vascular: No JVD.     Trachea: No tracheal deviation.  Cardiovascular:      Rate and Rhythm: Normal rate and regular rhythm.     Heart sounds: Normal heart sounds. No murmur heard.    No friction rub. No gallop.  Pulmonary:     Effort: Pulmonary effort is normal. No respiratory distress.     Breath sounds: No wheezing or rales.  Chest:     Chest wall: No tenderness.  Abdominal:     General: Bowel sounds are normal.     Palpations: Abdomen is soft.  Musculoskeletal:        General: Normal range of motion.     Cervical back: Normal range of motion and neck supple.  Lymphadenopathy:     Cervical: No cervical adenopathy.  Skin:    General: Skin is warm and dry.  Neurological:  Mental Status: She is alert and oriented to person, place, and time.     Cranial Nerves: No cranial nerve deficit.  Psychiatric:        Behavior: Behavior normal.        Thought Content: Thought content normal.        Judgment: Judgment normal.        Assessment/Plan: 1. Acute cystitis without hematuria Likely cause of incontinence however may need pelvic US if not resolved with ABX treatment. May also consider urology referral if continued UTIs. - nitrofurantoin, macrocrystal-monohydrate, (MACROBID) 100 MG capsule; Take 1 capsule (100 mg total) by mouth 2 (two) times daily.  Dispense: 20 capsule; Refill: 0  2. GAD (generalized anxiety disorder) Will restart lower dose lexapro and titrate as needed. Monitor BP to see if occasional high readings due to anxiety and improve with treatment - escitalopram (LEXAPRO) 5 MG tablet; Take 1 tablet (5 mg total) by mouth daily.  Dispense: 90 tablet; Refill: 1   General Counseling: Larsen verbalizes understanding of the findings of todays visit and agrees with plan of treatment. I have discussed any further diagnostic evaluation that may be needed or ordered today. We also reviewed her medications today. she has been encouraged to call the office with any questions or concerns that should arise related to todays visit.    No orders of the  defined types were placed in this encounter.   Meds ordered this encounter  Medications   nitrofurantoin, macrocrystal-monohydrate, (MACROBID) 100 MG capsule    Sig: Take 1 capsule (100 mg total) by mouth 2 (two) times daily.    Dispense:  20 capsule    Refill:  0   escitalopram (LEXAPRO) 5 MG tablet    Sig: Take 1 tablet (5 mg total) by mouth daily.    Dispense:  90 tablet    Refill:  1    This patient was seen by Drema Dallas, PA-C in collaboration with Dr. Clayborn Bigness as a part of collaborative care agreement.   Total time spent:30 Minutes Time spent includes review of chart, medications, test results, and follow up plan with the patient.      Dr Lavera Guise Internal medicine

## 2022-10-26 ENCOUNTER — Ambulatory Visit: Payer: Managed Care, Other (non HMO)

## 2022-10-27 ENCOUNTER — Other Ambulatory Visit: Payer: Self-pay | Admitting: Physician Assistant

## 2022-10-27 DIAGNOSIS — E2839 Other primary ovarian failure: Secondary | ICD-10-CM

## 2022-11-09 ENCOUNTER — Ambulatory Visit
Admission: RE | Admit: 2022-11-09 | Discharge: 2022-11-09 | Disposition: A | Discharge status: Home / Self Care | Payer: Managed Care, Other (non HMO) | Source: Ambulatory Visit | Attending: Nurse Practitioner | Admitting: Nurse Practitioner

## 2022-11-09 DIAGNOSIS — I6523 Occlusion and stenosis of bilateral carotid arteries: Secondary | ICD-10-CM

## 2022-11-22 ENCOUNTER — Encounter: Payer: Self-pay | Admitting: Physician Assistant

## 2022-11-22 ENCOUNTER — Ambulatory Visit: Payer: Managed Care, Other (non HMO) | Admitting: Physician Assistant

## 2022-11-22 VITALS — BP 110/70 | HR 69 | Temp 98.4°F | Resp 16 | Ht 65.5 in | Wt 124.0 lb

## 2022-11-22 DIAGNOSIS — N390 Urinary tract infection, site not specified: Secondary | ICD-10-CM

## 2022-11-22 DIAGNOSIS — F411 Generalized anxiety disorder: Secondary | ICD-10-CM | POA: Diagnosis not present

## 2022-11-22 DIAGNOSIS — R300 Dysuria: Secondary | ICD-10-CM | POA: Diagnosis not present

## 2022-11-22 LAB — POCT URINALYSIS DIPSTICK
Bilirubin, UA: NEGATIVE
Blood, UA: NEGATIVE
Glucose, UA: NEGATIVE
Ketones, UA: NEGATIVE
Leukocytes, UA: NEGATIVE
Nitrite, UA: POSITIVE
Protein, UA: POSITIVE — AB
Spec Grav, UA: 1.01 (ref 1.010–1.025)
Urobilinogen, UA: 0.2 E.U./dL
pH, UA: 5 (ref 5.0–8.0)

## 2022-11-22 NOTE — Progress Notes (Signed)
[facility] [place], [place] [address]  Internal MEDICINE  Office Visit Note  Patient Name: Barbara [name]  [id number]  [id number]  Date of Service: 11/23/2022  Chief Complaint  Patient presents with   Follow-up    UA   Anxiety    HPI Pt is here for routine follow up -Will recheck urine after treatment for UTI. Incontinence has improved since treatment. Some urgency still to get to bathroom, but no loss of control. Not having to get up in the middle of the night now either, so overall symptoms much better but not 100%. Urine still shows evidence of UTI and will therefore send for culture and treat accordingly. Did discuss considering Urology refill or prophylactic treatment if recurrent UTIs continue  -BP greatly improved today -Never restarted lexapro, she did pick it up but hasn't started it. Has it if needed, but overall has been managing better -Carotid US reviewed showing mild smooth plaque bilaterally without significant stenosis. Taking fish oil. Prefers to avoid statins but will think about it further and consider low dose 2days per week even. -taking 1000units per day of vitamin D now -Doing b12 at home  Current Medication: Outpatient Encounter Medications as of 11/22/2022  Medication Sig   albuterol (VENTOLIN HFA) 108 (90 Base) MCG/ACT inhaler TAKE 2 PUFFS BY MOUTH EVERY 6 HOURS AS NEEDED FOR WHEEZE OR SHORTNESS OF BREATH   carisoprodol (SOMA) 350 MG tablet Take 1 tablet (350 mg total) by mouth at bedtime.   celecoxib (CELEBREX) 200 MG capsule TAKE 1 CAPSULE BY MOUTH  DAILY   cyanocobalamin (VITAMIN B12) 1000 MCG/ML injection Inject 1 Ml (1,000 mcg total)into the muscle once a month   escitalopram (LEXAPRO) 5 MG tablet Take 1 tablet (5 mg total) by mouth daily.   estradiol (ESTRACE) 1 MG tablet TAKE 1 TABLET BY MOUTH DAILY   Syringe/Needle, Disp, (SYRINGE 3CC/25GX5/8") 25G X 5/8" 3 ML MISC 1 Device by Does not apply route every 30 (thirty) days. Use  with IM B12   topiramate (TOPAMAX) 50 MG tablet Take 2 tablets (100 mg total) by mouth daily.   [DISCONTINUED] nitrofurantoin, macrocrystal-monohydrate, (MACROBID) 100 MG capsule Take 1 capsule (100 mg total) by mouth 2 (two) times daily.   No facility-administered encounter medications on file as of 11/22/2022.    Surgical History: Past Surgical History:  Procedure Laterality Date   ABDOMINAL HYSTERECTOMY     BREAST BIOPSY Left 2014   neg   FOOT SURGERY Right    2009   LAPAROSCOPIC APPENDECTOMY  12/05/2012   NASAL SEPTUM SURGERY     SISTRUNK PROCEDURE  03/03/2012   Thyroglossal duct cyst excision    Medical History: Past Medical History:  Diagnosis Date   Allergic rhinitis    Anemia    Anxiety    Gallbladder polyp    Migraines     Family History: Family History  Adopted: Yes    Social History   Socioeconomic History   Marital status: Married    Spouse name: Not on file   Number of children: Not on file   Years of education: Not on file   Highest education level: Not on file  Occupational History   Not on file  Tobacco Use   Smoking status: Former    Types: Cigarettes    Quit date: 06/26/2021    Years since quitting: 1.4   Smokeless tobacco: Never   Tobacco comments:    8-10 a day. Smokes when stressed.  Substance  and Sexual Activity   Alcohol use: Never   Drug use: Never   Sexual activity: Not on file  Other Topics Concern   Not on file  Social History Narrative   Not on file   Social Determinants of Health   Financial Resource Strain: Not on file  Food Insecurity: Not on file  Transportation Needs: Not on file  Physical Activity: Not on file  Stress: Not on file  Social Connections: Not on file  Intimate Partner Violence: Not on file      Review of Systems  Constitutional:  Negative for chills, fatigue and unexpected weight change.  HENT:  Negative for congestion, rhinorrhea, sneezing and sore throat.   Eyes:  Negative for redness.   Respiratory:  Negative for cough, chest tightness and shortness of breath.   Cardiovascular:  Negative for chest pain and palpitations.  Gastrointestinal:  Negative for abdominal pain, constipation, diarrhea, nausea and vomiting.  Genitourinary:  Positive for urgency. Negative for dysuria and frequency.  Musculoskeletal:  Positive for arthralgias. Negative for back pain, joint swelling and neck pain.  Skin:  Negative for rash.  Neurological: Negative.  Negative for tremors and numbness.  Hematological:  Negative for adenopathy. Does not bruise/bleed easily.  Psychiatric/Behavioral:  Negative for behavioral problems (Depression), sleep disturbance and suicidal ideas. The patient is nervous/anxious.     Vital Signs: BP 110/70   Pulse 69   Temp 98.4 F (36.9 C)   Resp 16   Ht 5' 5.5" (1.664 m)   Wt 124 lb (56.2 kg)   SpO2 96%   BMI 20.32 kg/m    Physical Exam Vitals and nursing note reviewed.  Constitutional:      General: She is not in acute distress.    Appearance: Normal appearance. She is well-developed and normal weight. She is not diaphoretic.  HENT:     Head: Normocephalic and atraumatic.     Mouth/Throat:     Pharynx: No oropharyngeal exudate.  Eyes:     Pupils: Pupils are equal, round, and reactive to light.  Neck:     Thyroid: No thyromegaly.     Vascular: No JVD.     Trachea: No tracheal deviation.  Cardiovascular:     Rate and Rhythm: Normal rate and regular rhythm.     Heart sounds: Normal heart sounds. No murmur heard.    No friction rub. No gallop.  Pulmonary:     Effort: Pulmonary effort is normal. No respiratory distress.     Breath sounds: No wheezing or rales.  Chest:     Chest wall: No tenderness.  Abdominal:     General: Bowel sounds are normal.     Palpations: Abdomen is soft.  Musculoskeletal:        General: Normal range of motion.     Cervical back: Normal range of motion and neck supple.  Lymphadenopathy:     Cervical: No cervical  adenopathy.  Skin:    General: Skin is warm and dry.  Neurological:     Mental Status: She is alert and oriented to person, place, and time.     Cranial Nerves: No cranial nerve deficit.  Psychiatric:        Behavior: Behavior normal.        Thought Content: Thought content normal.        Judgment: Judgment normal.        Assessment/Plan: 1. GAD (generalized anxiety disorder) Stable, has lexapro at home to start if stress and anxiety rising again  2. Urinary tract infection without hematuria, site unspecified Will send for culture and treat accordingly, consider urology referral if recurrent UTIs or if worsening incontinence again - CULTURE, URINE COMPREHENSIVE  3. Dysuria - POCT Urinalysis Dipstick   General Counseling: [place] verbalizes understanding of the findings of todays visit and agrees with plan of treatment. I have discussed any further diagnostic evaluation that may be needed or ordered today. We also reviewed her medications today. she has been encouraged to call the office with any questions or concerns that should arise related to todays visit.    Orders Placed This Encounter  Procedures   CULTURE, URINE COMPREHENSIVE   POCT Urinalysis Dipstick    No orders of the defined types were placed in this encounter.   This patient was seen by Drema Dallas, PA-C in collaboration with Dr. Clayborn Bigness as a part of collaborative care agreement.   Total time spent:30 Minutes Time spent includes review of chart, medications, test results, and follow up plan with the patient.      Dr Lavera Guise Internal medicine

## 2022-11-26 ENCOUNTER — Other Ambulatory Visit: Payer: Self-pay | Admitting: Physician Assistant

## 2022-11-26 DIAGNOSIS — N3000 Acute cystitis without hematuria: Secondary | ICD-10-CM

## 2022-11-26 LAB — CULTURE, URINE COMPREHENSIVE

## 2022-11-26 MED ORDER — SULFAMETHOXAZOLE-TRIMETHOPRIM 800-160 MG PO TABS: 1.0000 | ORAL_TABLET | Freq: Two times a day (BID) | ORAL | 0 refills | Status: DC

## 2022-11-27 ENCOUNTER — Telehealth: Payer: Self-pay

## 2022-11-27 NOTE — Telephone Encounter (Signed)
Spoke with patient regarding urine culture results and that Bactrim was sent to pharmacy.

## 2022-11-27 NOTE — Telephone Encounter (Signed)
-----   Message from Mylinda Latina, PA-C sent at 11/26/2022  4:08 PM EST ----- Please let her know she has another UTI but it is resistant to macrobid therefore I am sending bactrim instead

## 2022-12-27 ENCOUNTER — Ambulatory Visit (INDEPENDENT_AMBULATORY_CARE_PROVIDER_SITE_OTHER): Payer: Managed Care, Other (non HMO) | Admitting: Physician Assistant

## 2022-12-27 ENCOUNTER — Encounter: Payer: Self-pay | Admitting: Physician Assistant

## 2022-12-27 VITALS — BP 130/71 | HR 73 | Temp 98.2°F | Resp 16 | Ht 65.5 in | Wt 124.4 lb

## 2022-12-27 DIAGNOSIS — R32 Unspecified urinary incontinence: Secondary | ICD-10-CM | POA: Diagnosis not present

## 2022-12-27 DIAGNOSIS — R319 Hematuria, unspecified: Secondary | ICD-10-CM

## 2022-12-27 DIAGNOSIS — Z9889 Other specified postprocedural states: Secondary | ICD-10-CM

## 2022-12-27 DIAGNOSIS — N390 Urinary tract infection, site not specified: Secondary | ICD-10-CM | POA: Diagnosis not present

## 2022-12-27 DIAGNOSIS — R300 Dysuria: Secondary | ICD-10-CM

## 2022-12-27 NOTE — Progress Notes (Signed)
[facility] [place], [place] [address]  Internal MEDICINE  Office Visit Note  Patient Name: Barbara [name]  [id number]  [id number]  Date of Service: 12/28/2022  Chief Complaint  Patient presents with   Follow-up    HPI Pt is here for routine follow up to recheck urine -Incontinence is much better now, still has some though. Described her incontinence as having urgency to go and needs to get to bathroom immediately otherwise has incontinence. She wears liners just in case. -no burning with urination currently -unsure if lower pelvic/vaginal heaviness, but will feel some pressure -Did have some surgery previously that was done on bladder--thinks mesh placed? Remembers discussing mesh vs sling -Given possible hx of bladder surgery and recurrent UTIs and incontinence, will go ahead and refer to urology for evaluation of possible prolapse -BP stable  Current Medication: Outpatient Encounter Medications as of 12/27/2022  Medication Sig   albuterol (VENTOLIN HFA) 108 (90 Base) MCG/ACT inhaler TAKE 2 PUFFS BY MOUTH EVERY 6 HOURS AS NEEDED FOR WHEEZE OR SHORTNESS OF BREATH   carisoprodol (SOMA) 350 MG tablet Take 1 tablet (350 mg total) by mouth at bedtime.   celecoxib (CELEBREX) 200 MG capsule TAKE 1 CAPSULE BY MOUTH  DAILY   cyanocobalamin (VITAMIN B12) 1000 MCG/ML injection Inject 1 Ml (1,000 mcg total)into the muscle once a month   escitalopram (LEXAPRO) 5 MG tablet Take 1 tablet (5 mg total) by mouth daily.   estradiol (ESTRACE) 1 MG tablet TAKE 1 TABLET BY MOUTH DAILY   sulfamethoxazole-trimethoprim (BACTRIM DS) 800-160 MG tablet Take 1 tablet by mouth 2 (two) times daily.   Syringe/Needle, Disp, (SYRINGE 3CC/25GX5/8") 25G X 5/8" 3 ML MISC 1 Device by Does not apply route every 30 (thirty) days. Use with IM B12   topiramate (TOPAMAX) 50 MG tablet Take 2 tablets (100 mg total) by mouth daily.   No facility-administered encounter medications on file as of  12/27/2022.    Surgical History: Past Surgical History:  Procedure Laterality Date   ABDOMINAL HYSTERECTOMY     BREAST BIOPSY Left 2014   neg   FOOT SURGERY Right    2009   LAPAROSCOPIC APPENDECTOMY  12/05/2012   NASAL SEPTUM SURGERY     SISTRUNK PROCEDURE  03/03/2012   Thyroglossal duct cyst excision    Medical History: Past Medical History:  Diagnosis Date   Allergic rhinitis    Anemia    Anxiety    Gallbladder polyp    Migraines     Family History: Family History  Adopted: Yes    Social History   Socioeconomic History   Marital status: Married    Spouse name: Not on file   Number of children: Not on file   Years of education: Not on file   Highest education level: Not on file  Occupational History   Not on file  Tobacco Use   Smoking status: Former    Types: Cigarettes    Quit date: 06/26/2021    Years since quitting: 1.5   Smokeless tobacco: Never   Tobacco comments:    8-10 a day. Smokes when stressed.  Substance and Sexual Activity   Alcohol use: Never   Drug use: Never   Sexual activity: Not on file  Other Topics Concern   Not on file  Social History Narrative   Not on file   Social Determinants of Health   Financial Resource Strain: Not on file  Food Insecurity: Not on file  Transportation Needs:  Not on file  Physical Activity: Not on file  Stress: Not on file  Social Connections: Not on file  Intimate Partner Violence: Not on file      Review of Systems  Constitutional:  Negative for chills, fatigue and unexpected weight change.  HENT:  Negative for congestion, rhinorrhea, sneezing and sore throat.   Eyes:  Negative for redness.  Respiratory:  Negative for cough, chest tightness and shortness of breath.   Cardiovascular:  Negative for chest pain and palpitations.  Gastrointestinal:  Negative for abdominal pain, constipation, diarrhea, nausea and vomiting.  Genitourinary:  Positive for urgency. Negative for dysuria and frequency.   Musculoskeletal:  Positive for arthralgias. Negative for back pain, joint swelling and neck pain.  Skin:  Negative for rash.  Neurological: Negative.  Negative for tremors and numbness.  Hematological:  Negative for adenopathy. Does not bruise/bleed easily.  Psychiatric/Behavioral:  Negative for behavioral problems (Depression), sleep disturbance and suicidal ideas. The patient is nervous/anxious.     Vital Signs: BP 130/71   Pulse 73   Temp 98.2 F (36.8 C)   Resp 16   Ht 5' 5.5" (1.664 m)   Wt 124 lb 6.4 oz (56.4 kg)   SpO2 97%   BMI 20.39 kg/m    Physical Exam Vitals and nursing note reviewed.  Constitutional:      General: She is not in acute distress.    Appearance: Normal appearance. She is well-developed and normal weight. She is not diaphoretic.  HENT:     Head: Normocephalic and atraumatic.     Mouth/Throat:     Pharynx: No oropharyngeal exudate.  Eyes:     Pupils: Pupils are equal, round, and reactive to light.  Neck:     Thyroid: No thyromegaly.     Vascular: No JVD.     Trachea: No tracheal deviation.  Cardiovascular:     Rate and Rhythm: Normal rate and regular rhythm.     Heart sounds: Normal heart sounds. No murmur heard.    No friction rub. No gallop.  Pulmonary:     Effort: Pulmonary effort is normal. No respiratory distress.     Breath sounds: No wheezing or rales.  Chest:     Chest wall: No tenderness.  Abdominal:     General: Bowel sounds are normal.     Palpations: Abdomen is soft.  Musculoskeletal:        General: Normal range of motion.     Cervical back: Normal range of motion and neck supple.  Lymphadenopathy:     Cervical: No cervical adenopathy.  Skin:    General: Skin is warm and dry.  Neurological:     Mental Status: She is alert and oriented to person, place, and time.     Cranial Nerves: No cranial nerve deficit.  Psychiatric:        Behavior: Behavior normal.        Thought Content: Thought content normal.        Judgment:  Judgment normal.        Assessment/Plan: 1. Dysuria - POCT Urinalysis Dipstick  2. Urinary tract infection with hematuria, site unspecified Will check culture and treat as needed - CULTURE, URINE COMPREHENSIVE  3. Recurrent UTI Has been treated for several UTIs, will refer to urology - Ambulatory referral to Urology  4. Incontinence in female - Ambulatory referral to Urology  5. History of bladder surgery - Ambulatory referral to Urology   General Counseling: [place] verbalizes understanding of the findings of  todays visit and agrees with plan of treatment. I have discussed any further diagnostic evaluation that may be needed or ordered today. We also reviewed her medications today. she has been encouraged to call the office with any questions or concerns that should arise related to todays visit.    Orders Placed This Encounter  Procedures   CULTURE, URINE COMPREHENSIVE   Ambulatory referral to Urology   POCT Urinalysis Dipstick    No orders of the defined types were placed in this encounter.   This patient was seen by Drema Dallas, PA-C in collaboration with Dr. Clayborn Bigness as a part of collaborative care agreement.   Total time spent:30 Minutes Time spent includes review of chart, medications, test results, and follow up plan with the patient.      Dr Lavera Guise Internal medicine

## 2022-12-28 LAB — POCT URINALYSIS DIPSTICK
Bilirubin, UA: NEGATIVE
Glucose, UA: NEGATIVE
Ketones, UA: NEGATIVE
Leukocytes, UA: NEGATIVE
Nitrite, UA: NEGATIVE
Protein, UA: NEGATIVE
Spec Grav, UA: 1.01 (ref 1.010–1.025)
Urobilinogen, UA: 0.2 E.U./dL
pH, UA: 5 (ref 5.0–8.0)

## 2022-12-30 LAB — CULTURE, URINE COMPREHENSIVE

## 2023-01-03 ENCOUNTER — Telehealth: Payer: Self-pay

## 2023-01-03 NOTE — Telephone Encounter (Signed)
Spoke with patient regarding urine culture results. 

## 2023-01-03 NOTE — Telephone Encounter (Signed)
-----   Message from Mylinda Latina, PA-C sent at 01/02/2023 11:04 AM EDT ----- Please let patient know there was no growth on most recent culture

## 2023-01-25 ENCOUNTER — Encounter: Payer: Self-pay | Admitting: Urology

## 2023-02-17 ENCOUNTER — Other Ambulatory Visit: Payer: Self-pay | Admitting: Physician Assistant

## 2023-02-17 DIAGNOSIS — M150 Primary generalized (osteo)arthritis: Secondary | ICD-10-CM

## 2023-03-04 ENCOUNTER — Encounter: Payer: Self-pay | Admitting: Urology

## 2023-03-04 ENCOUNTER — Ambulatory Visit: Payer: Self-pay | Admitting: Urology

## 2023-03-04 ENCOUNTER — Ambulatory Visit: Payer: Managed Care, Other (non HMO) | Admitting: Urology

## 2023-03-04 VITALS — BP 129/70 | HR 72

## 2023-03-04 DIAGNOSIS — N3946 Mixed incontinence: Secondary | ICD-10-CM | POA: Diagnosis not present

## 2023-03-04 DIAGNOSIS — R32 Unspecified urinary incontinence: Secondary | ICD-10-CM

## 2023-03-04 LAB — URINALYSIS, COMPLETE
Bilirubin, UA: NEGATIVE
Glucose, UA: NEGATIVE
Ketones, UA: NEGATIVE
Nitrite, UA: POSITIVE — AB
Specific Gravity, UA: 1.025 (ref 1.005–1.030)
Urobilinogen, Ur: 0.2 mg/dL (ref 0.2–1.0)
pH, UA: 6.5 (ref 5.0–7.5)

## 2023-03-04 LAB — MICROSCOPIC EXAMINATION
Epithelial Cells (non renal): >10 /hpf — AB (ref 0–10)
WBC, UA: >30 /hpf — AB (ref 0–5)

## 2023-03-04 MED ORDER — TRIMETHOPRIM 100 MG PO TABS: 100.0000 mg | ORAL_TABLET | Freq: Every day | ORAL | 11 refills | Status: DC

## 2023-03-04 MED ORDER — CIPROFLOXACIN HCL 500 MG PO TABS: 500.0000 mg | ORAL_TABLET | Freq: Two times a day (BID) | ORAL | 0 refills | Status: DC

## 2023-03-04 NOTE — Progress Notes (Signed)
03/04/2023 10:35 AM   Barbara [name] Jun 17, 1961 [id number]  Referring provider: Carlean Jews, PA-C [address] [place],  [place] [address]  No chief complaint on file.   HPI: I was consulted to assess the patient's urinary incontinence.  She leaks with coughing sneezing bending lifting as well as walking.  She has urge incontinence.  It was difficult to say which one was worse but she is bothered a lot by her frequency as well.  She has bedwetting that varies in severity.  She wears 3-7 pads a day with varying severity  She voids every 2 hours and gets up every 2-3 was at night to urinate.  Flow was reasonable to light but she does feel empty  She describes likely a bladder sling 10 to 15 years ago.  She thinks she has had 3-4 bladder infections since last September but she did not report symptoms  Has had a hysterectomy.  2 out of the last 3 urine cultures in the last 9 months in the medical record were negative  Bowel movements normal.  No treatment.  No history of kidney stones.  No neurologic issues   PMH: Past Medical History:  Diagnosis Date   Allergic rhinitis    Anemia    Anxiety    Gallbladder polyp    Migraines     Surgical History: Past Surgical History:  Procedure Laterality Date   ABDOMINAL HYSTERECTOMY     BREAST BIOPSY Left 2014   neg   FOOT SURGERY Right    2009   LAPAROSCOPIC APPENDECTOMY  12/05/2012   NASAL SEPTUM SURGERY     SISTRUNK PROCEDURE  03/03/2012   Thyroglossal duct cyst excision    Home Medications:  Allergies as of 03/04/2023   No Known Allergies      Medication List        Accurate as of Mar 04, 2023 10:35 AM. If you have any questions, ask your nurse or doctor.          albuterol 108 (90 Base) MCG/ACT inhaler Commonly known as: VENTOLIN HFA TAKE 2 PUFFS BY MOUTH EVERY 6 HOURS AS NEEDED FOR WHEEZE OR SHORTNESS OF BREATH   carisoprodol 350 MG tablet Commonly known as: SOMA Take 1 tablet (350 mg total) by mouth  at bedtime.   celecoxib 200 MG capsule Commonly known as: CELEBREX TAKE 1 CAPSULE BY MOUTH DAILY   cyanocobalamin 1000 MCG/ML injection Commonly known as: VITAMIN B12 Inject 1 Ml (1,000 mcg total)into the muscle once a month   escitalopram 5 MG tablet Commonly known as: Lexapro Take 1 tablet (5 mg total) by mouth daily.   estradiol 1 MG tablet Commonly known as: ESTRACE TAKE 1 TABLET BY MOUTH DAILY   sulfamethoxazole-trimethoprim 800-160 MG tablet Commonly known as: BACTRIM DS Take 1 tablet by mouth 2 (two) times daily.   SYRINGE 3CC/25GX5/8" 25G X 5/8" 3 ML Misc 1 Device by Does not apply route every 30 (thirty) days. Use with IM B12   topiramate 50 MG tablet Commonly known as: TOPAMAX Take 2 tablets (100 mg total) by mouth daily.        Allergies: No Known Allergies  Family History: Family History  Adopted: Yes    Social History:  reports that she quit smoking about 20 months ago. Her smoking use included cigarettes. She has never used smokeless tobacco. She reports that she does not drink alcohol and does not use drugs.  ROS:  Physical Exam: There were no vitals taken for this visit.  Constitutional:  Alert and oriented, No acute distress. HEENT: [place] AT, moist mucus membranes.  Trachea midline, no masses. Cardiovascular: No clubbing, cyanosis, or edema. Respiratory: Normal respiratory effort, no increased work of breathing. GI: Abdomen is soft, nontender, nondistended, no abdominal masses GU: Patient was nervous to have a pelvic examination but did very well.  She had grade 1 hypermobility and leaked a drop with a milder cough.  No prolapse. Skin: No rashes, bruises or suspicious lesions. Lymph: No cervical or inguinal adenopathy. Neurologic: Grossly intact, no focal deficits, moving all 4 extremities. Psychiatric: Normal mood and affect.  Laboratory Data: Lab Results  Component Value Date    WBC 6.1 10/04/2022   HGB 13.1 10/04/2022   HCT 38.7 10/04/2022   MCV 97 10/04/2022   PLT 278 10/04/2022    Lab Results  Component Value Date   CREATININE 1.26 (H) 10/04/2022    No results found for: "PSA"  No results found for: "TESTOSTERONE"  No results found for: "HGBA1C"  Urinalysis    Component Value Date/Time   COLORURINE Yellow 12/05/2012 1448   APPEARANCEUR Clear 10/16/2022 1537   LABSPEC 1.025 12/05/2012 1448   PHURINE 6.0 12/05/2012 1448   GLUCOSEU Negative 10/16/2022 1537   GLUCOSEU Negative 12/05/2012 1448   HGBUR 2+ 12/05/2012 1448   BILIRUBINUR Negative 12/28/2022 1323   BILIRUBINUR Negative 10/16/2022 1537   BILIRUBINUR Negative 12/05/2012 1448   KETONESUR 1+ 12/05/2012 1448   PROTEINUR Negative 12/28/2022 1323   PROTEINUR 1+ (A) 10/16/2022 1537   PROTEINUR Negative 12/05/2012 1448   UROBILINOGEN 0.2 12/28/2022 1323   NITRITE Negativ 12/28/2022 1323   NITRITE Positive (A) 10/16/2022 1537   NITRITE Negative 12/05/2012 1448   LEUKOCYTESUR Negative 12/28/2022 1323   LEUKOCYTESUR Trace (A) 10/16/2022 1537   LEUKOCYTESUR Negative 12/05/2012 1448    Pertinent Imaging: Urine reviewed and sent for culture.  Chart reviewed  Assessment & Plan: Patient has mixed incontinence.  She has bedwetting.  She has frequency both day and night.  Role of urodynamics and cystoscopy discussed.  Urine looked positive and sent for culture.  Patient is incontinence started fairly acutely last August.  She may be having recurrent bladder infections as the cause.  I think urodynamics and cystoscopy may be more challenging based upon today's examination.  I thought it was reasonable to place her on urinary prophylaxis and await the urine culture.  I will get a CT scan for hematuria. I would see her back in 6 or 7 weeks and proceed accordingly.  Here her story she is having a lot of urgency affecting her quality life and at work.  For this reason I did call in ciprofloxacin 250 mg twice  a day for 7 days followed by trimethoprim 100 mg 30 x 11.  I went through urodynamics and cystoscopy in full detail and she may need these in the future.  She has had a previous sling  There are no diagnoses linked to this encounter.  No follow-ups on file.  Martina Sinner, MD  [facility] [address], [address] [place], [place] [address] [phone]

## 2023-03-07 ENCOUNTER — Telehealth: Payer: Self-pay

## 2023-03-07 LAB — CULTURE, URINE COMPREHENSIVE

## 2023-03-07 MED ORDER — SULFAMETHOXAZOLE-TRIMETHOPRIM 800-160 MG PO TABS: 1.0000 | ORAL_TABLET | Freq: Two times a day (BID) | ORAL | 0 refills | Status: DC

## 2023-03-07 NOTE — Telephone Encounter (Signed)
Patient advised and RX sent in. 

## 2023-03-07 NOTE — Telephone Encounter (Signed)
-----   Message from Alfredo Martinez, MD sent at 03/07/2023  3:20 PM EDT ----- Stop the cipro Take septra DS one bid for 7 days, then start daily prophylaxis as  ordered ----- Message ----- From: Consuella Lose, CMA Sent: 03/07/2023   2:38 PM EDT To: Alfredo Martinez, MD   ----- Message ----- From: Interface, [facility] Lab Results In Sent: 03/04/2023   1:36 PM EDT To: Jennette Kettle Clinical

## 2023-03-11 ENCOUNTER — Other Ambulatory Visit: Payer: Self-pay | Admitting: Physician Assistant

## 2023-03-11 DIAGNOSIS — F411 Generalized anxiety disorder: Secondary | ICD-10-CM

## 2023-03-14 ENCOUNTER — Telehealth: Payer: Self-pay

## 2023-03-14 NOTE — Telephone Encounter (Signed)
Pt LM on triage line stating that she has not heard anything in regards to getting her CT scan scheduled. Can you check to make sure it has been authorized.

## 2023-03-22 ENCOUNTER — Ambulatory Visit
Admission: RE | Admit: 2023-03-22 | Discharge: 2023-03-22 | Disposition: A | Discharge status: Home / Self Care | Payer: Managed Care, Other (non HMO) | Source: Ambulatory Visit | Attending: Urology | Admitting: Urology

## 2023-03-22 DIAGNOSIS — R32 Unspecified urinary incontinence: Secondary | ICD-10-CM | POA: Diagnosis present

## 2023-03-22 LAB — POCT I-STAT CREATININE: Creatinine, Ser: 1.6 mg/dL — ABNORMAL HIGH (ref 0.44–1.00)

## 2023-03-22 MED ORDER — IOHEXOL 300 MG/ML  SOLN
80.0000 mL | Freq: Once | INTRAMUSCULAR | Status: AC | PRN
  Administered 2023-03-22: 80 mL via INTRAVENOUS

## 2023-03-22 MED ORDER — IOHEXOL 300 MG/ML  SOLN: 100.0000 mL | Freq: Once | INTRAMUSCULAR | Status: DC | PRN

## 2023-03-26 ENCOUNTER — Ambulatory Visit: Payer: Managed Care, Other (non HMO) | Admitting: Urology

## 2023-03-26 VITALS — BP 107/66 | HR 77

## 2023-03-26 DIAGNOSIS — Z01818 Encounter for other preprocedural examination: Secondary | ICD-10-CM

## 2023-03-26 DIAGNOSIS — N3289 Other specified disorders of bladder: Secondary | ICD-10-CM

## 2023-03-26 DIAGNOSIS — N1330 Unspecified hydronephrosis: Secondary | ICD-10-CM

## 2023-03-26 DIAGNOSIS — R32 Unspecified urinary incontinence: Secondary | ICD-10-CM

## 2023-03-26 DIAGNOSIS — C678 Malignant neoplasm of overlapping sites of bladder: Secondary | ICD-10-CM

## 2023-03-26 DIAGNOSIS — Z720 Tobacco use: Secondary | ICD-10-CM

## 2023-03-26 LAB — MICROSCOPIC EXAMINATION

## 2023-03-26 LAB — URINALYSIS, COMPLETE
Bilirubin, UA: NEGATIVE
Glucose, UA: NEGATIVE
Ketones, UA: NEGATIVE
Leukocytes,UA: NEGATIVE
Nitrite, UA: NEGATIVE
Specific Gravity, UA: >1.03 — ABNORMAL HIGH (ref 1.005–1.030)
Urobilinogen, Ur: 1 mg/dL (ref 0.2–1.0)
pH, UA: 6 (ref 5.0–7.5)

## 2023-03-26 MED ORDER — ACETAMINOPHEN-CODEINE 300-30 MG PO TABS: 1.0000 | ORAL_TABLET | ORAL | 0 refills | Status: DC | PRN

## 2023-03-26 NOTE — Addendum Note (Signed)
Addended by: Vanna Scotland on: 03/26/2023 04:23 PM   Modules accepted: Orders

## 2023-03-26 NOTE — Progress Notes (Signed)
   03/26/23  CC: No chief complaint on file.   HPI: 62 year old smoker with urinary incontinence who presents today for cystoscopy.  In the interim, she underwent CT urogram for microscopic hematuria and have a large irregular bladder mass and moderate left hydroureteronephrosis down to the level of the bladder.  She does mention today that she continues to have left flank pain which ibuprofen and Tylenol do not touch.  There were no vitals taken for this visit. NED. A&Ox3.   No respiratory distress   Abd soft, NT, ND Normal external genitalia with patent urethral meatus  Cystoscopy Procedure Note  Patient identification was confirmed, informed consent was obtained, and patient was prepped using Betadine solution.  Lidocaine jelly was administered per urethral meatus.    Procedure: - Flexible cystoscope introduced, without any difficulty.   - Thorough search of the bladder revealed:    normal urethral meatus    Large nodular irregular tumor involving greater than 5 cm of the left lateral bladder wall extending to the trigone and up towards the dome.  Highly suspicious for high-grade malignancy.  Left UO is not visualized.  Post-Procedure: - Patient tolerated the procedure well  Assessment/ Plan:  1. 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  Risk of surgery including risk of bleeding, infection, damage surrounding structures, bladder perforation, stent pain and discomfort, need for additional procedures amongst others were discussed.  We discussed staging of bladder cancer.  She understands that she may need to return to the operating room for additional resection depending on surgical pathology.  Will keep the stent for  unknown period of time until we have completed staging.  She understands and agrees. - Urinalysis, Complete - CULTURE, URINE COMPREHENSIVE  2. 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 Pre-op testing Preoperative urine culture today - CULTURE, URINE COMPREHENSIVE  4. Tobacco abuse We discussed the causative relationship between tobacco abuse and bladder cancer.  Highly encourage cessation.    Vanna Scotland, MD

## 2023-03-26 NOTE — H&P (View-Only) (Signed)
   03/26/23  CC: No chief complaint on file.   HPI: 62 year old smoker with urinary incontinence who presents today for cystoscopy.  In the interim, she underwent CT urogram for microscopic hematuria and have a large irregular bladder mass and moderate left hydroureteronephrosis down to the level of the bladder.  She does mention today that she continues to have left flank pain which ibuprofen and Tylenol do not touch.  There were no vitals taken for this visit. NED. A&Ox3.   No respiratory distress   Abd soft, NT, ND Normal external genitalia with patent urethral meatus  Cystoscopy Procedure Note  Patient identification was confirmed, informed consent was obtained, and patient was prepped using Betadine solution.  Lidocaine jelly was administered per urethral meatus.    Procedure: - Flexible cystoscope introduced, without any difficulty.   - Thorough search of the bladder revealed:    normal urethral meatus    Large nodular irregular tumor involving greater than 5 cm of the left lateral bladder wall extending to the trigone and up towards the dome.  Highly suspicious for high-grade malignancy.  Left UO is not visualized.  Post-Procedure: - Patient tolerated the procedure well  Assessment/ Plan:  1. 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  Risk of surgery including risk of bleeding, infection, damage surrounding structures, bladder perforation, stent pain and discomfort, need for additional procedures amongst others were discussed.  We discussed staging of bladder cancer.  She understands that she may need to return to the operating room for additional resection depending on surgical pathology.  Will keep the stent for  unknown period of time until we have completed staging.  She understands and agrees. - Urinalysis, Complete - CULTURE, URINE COMPREHENSIVE  2. 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 Pre-op testing Preoperative urine culture today - CULTURE, URINE COMPREHENSIVE  4. Tobacco abuse We discussed the causative relationship between tobacco abuse and bladder cancer.  Highly encourage cessation.    Vanna Scotland, MD

## 2023-03-26 NOTE — Patient Instructions (Signed)
   Transurethral Resection of Bladder Tumor  Transurethral resection of a bladder tumor is the removal (resection) of cancerous tissue (tumor) from the inside wall of the bladder. The bladder is the organ that holds urine. The tumor is removed through the tube that carries urine out of the body (urethra). In a transurethral resection, a thin telescope with a light, a tiny camera, and an electric cutting edge (resectoscope) is passed through the urethra. In men, the opening of the urethra is at the end of the penis. In women, it is just above the opening of the vagina. Tell a health care provider about: Any allergies you have. All medicines you are taking, including vitamins, herbs, eye drops, creams, and over-the-counter medicines. Any problems you or family members have had with anesthetic medicines. Any bleeding problems you have. Any surgeries you have had. Any medical conditions you have, including recent urinary tract infections. Whether you are pregnant or may be pregnant. What are the risks? Generally, this is a safe procedure. However, problems may occur, including: Infection. Bleeding. Allergic reactions to medicines. Damage to nearby structures or organs. Difficulty urinating from blockage of the urethra or not being able to urinate (urinary retention). Deep vein thrombosis. This is a blood clot that can develop in your leg. Recurring cancer. What happens before the procedure? When to stop eating and drinking Follow instructions from your health care provider about what you may eat and drink before your procedure. These may include: 8 hours before your procedure Stop eating most foods. Do not eat meat, fried foods, or fatty foods. Eat only light foods, such as toast or crackers. All liquids are okay except energy drinks and alcohol. 6 hours before your procedure Stop eating. Drink only clear liquids, such as water, clear fruit juice, black coffee, plain tea, and sports  drinks. Do not drink energy drinks or alcohol. 2 hours before your procedure Stop drinking all liquids. You may be allowed to take medicines with small sips of water. Medicines Ask your health care provider about: Changing or stopping your regular medicines. This is especially important if you are taking diabetes medicines or blood thinners. Taking medicines such as aspirin and ibuprofen. These medicines can thin your blood. Do not take these medicines unless your health care provider tells you to take them. Taking over-the-counter medicines, vitamins, herbs, and supplements. General instructions If you will be going home right after the procedure, plan to have a responsible adult: Take you home from the hospital or clinic. You will not be allowed to drive. Care for you for the time you are told. Ask your health care provider what steps will be taken to help prevent infection. These steps may include: Washing skin with a germ-killing soap. Taking antibiotic medicine. Do not use any products that contain nicotine or tobacco for at least 4 weeks before the procedure. These products include cigarettes, chewing tobacco, and vaping devices, such as e-cigarettes. If you need help quitting, ask your health care provider. What happens during the procedure? An IV will be inserted into one of your veins. You will be given one or more of the following: A medicine to help you relax (sedative). A medicine that is injected into your spine to numb the area below and slightly above the injection site (spinal anesthetic). A medicine that is injected into an area of your body to numb everything below the injection site (regional anesthetic). A medicine to make you fall asleep (general anesthetic). Your legs will be placed   in foot rests (stirrups) to open your legs and bend your knees. The resectoscope will be passed through your urethra and into your bladder. The part of your bladder with the tumor will be  resected by the cutting edge of the resectoscope. Fluid will be passed to rinse out the cut tissues (irrigation). The resectoscope will then be taken out. A small, thin tube (catheter) will be passed through your urethra and into your bladder. The catheter will drain urine into a bag outside of your body. The procedure may vary among health care providers and hospitals. What happens after the procedure? Your blood pressure, heart rate, breathing rate, and blood oxygen level will be monitored until you leave the hospital or clinic. You may continue to receive fluids and medicines through an IV. You will be given pain medicine to relieve pain. You will have a catheter to drain your urine. The amount of urine will be measured. If you have blood in your urine, your bladder may be rinsed out by passing fluid through your catheter. You will be encouraged to walk as soon as you can. You may have to wear compression stockings. These stockings help to prevent blood clots and reduce swelling in your legs. If you were given a sedative during the procedure, it can affect you for several hours. Do not drive or operate machinery until your health care provider says that it is safe. Summary Transurethral resection of a bladder tumor is the removal (resection) of a cancerous growth (tumor) on the inside wall of the bladder. To do this procedure, your health care provider uses a thin telescope with a light, a tiny camera, and an electric cutting edge (resectoscope) that is guided to your bladder through your urethra. The part of your bladder that is affected by the tumor will be resected by the cutting edge of the resectoscope. A catheter will be passed through your urethra and into your bladder. The catheter will drain urine into a bag outside of your body. If you will be going home right after the procedure, plan to have a responsible adult take you home from the hospital or clinic. You will not be allowed to  drive. This information is not intended to replace advice given to you by your health care provider. Make sure you discuss any questions you have with your health care provider. Document Revised: 10/13/2021 Document Reviewed: 10/13/2021 Elsevier Patient Education  2024 [employer]  

## 2023-03-27 ENCOUNTER — Telehealth: Payer: Self-pay

## 2023-03-27 ENCOUNTER — Other Ambulatory Visit: Payer: Self-pay | Admitting: Urology

## 2023-03-27 DIAGNOSIS — D494 Neoplasm of unspecified behavior of bladder: Secondary | ICD-10-CM

## 2023-03-27 MED ORDER — ACETAMINOPHEN-CODEINE 300-30 MG PO TABS: 1.0000 | ORAL_TABLET | ORAL | 0 refills | Status: DC | PRN

## 2023-03-27 NOTE — Telephone Encounter (Signed)
Patient advised RX sent in to new location

## 2023-03-27 NOTE — Telephone Encounter (Signed)
I spoke with Barbara [name]. We have discussed possible surgery dates and Thursday June 13th, 2024 was agreed upon by all parties. Patient given information about surgery date, what to expect pre-operatively and post operatively.  We discussed that a Pre-Admission Testing office will be calling to set up the pre-op visit that will take place prior to surgery, and that these appointments are typically done over the phone with a Pre-Admissions RN. Informed patient that our office will communicate any additional care to be provided after surgery. Patients questions or concerns were discussed during our call. Advised to call our office should there be any additional information, questions or concerns that arise. Patient verbalized understanding.

## 2023-03-27 NOTE — Progress Notes (Signed)
    [facility] Posting Form  Surgery Date: Date: 04/04/2023  Surgeon: Dr. Vanna Scotland, MD  Inpt ( No  )   Outpt (Yes)   Obs ( No  )   Diagnosis: D49.4 Bladder Tumor  -CPT: 16109, 74420, 51720, 52240  Surgery: Left Retrograde Pyelogram and Left Ureteral Stent Placement, Transurethral Resection of Bladder Tumor and Intravesical Instillation of Gemcitabine  Stop Anticoagulations: Yes and also hold ASA  Cardiac/Medical/Pulmonary Clearance needed: no  *Orders entered into EPIC  Date: 03/27/23   *Case booked in [place]  Date: 03/27/23  *Notified pt of Surgery: Date: 03/27/23  PRE-OP UA & CX: no  *Placed into Prior Authorization Work Que Date: 03/27/23  Assistant/laser/rep:No

## 2023-03-27 NOTE — Addendum Note (Signed)
Addended by: Vanna Scotland on: 03/27/2023 08:44 AM   Modules accepted: Level of Service

## 2023-03-27 NOTE — Progress Notes (Signed)
Surgical Physician Order Form [facility]  * Scheduling expectation : 04/04/23  *Length of Case:   *Clearance needed: no  *Anticoagulation Instructions: Hold all anticoagulants  *Aspirin Instructions: Hold Aspirin  *Post-op visit Date/Instructions:   1 week post op  *Diagnosis: Bladder Tumor  *Procedure: left retrograde pyelogram and ureteral stent placement ,TURBT >5cm (52240), intravesical gemcitabine instillation   Additional orders: Gemcitabine 2000mg  bladder instillation  -Admit type: OUTpatient  -Anesthesia: General  -VTE Prophylaxis Standing Order SCD's       Other:   -Standing Lab Orders Per Anesthesia    Lab other: None  -Standing Test orders EKG/Chest x-ray per Anesthesia       Test other:   - Medications:  Ancef 2gm IV  -Other orders:  N/A

## 2023-03-27 NOTE — Telephone Encounter (Signed)
Dr Apolinar Junes, [facility] does not have Tylenol # 3 in stock, has been on back order with some of the pharmacies but [facility] [address] [facility]. [facility] this RX with them. I pulled down medication for review to be sent to the other location please.

## 2023-03-29 ENCOUNTER — Encounter
Admission: RE | Admit: 2023-03-29 | Discharge: 2023-03-29 | Disposition: A | Discharge status: Home / Self Care | Payer: Managed Care, Other (non HMO) | Source: Ambulatory Visit | Attending: Urology | Admitting: Urology

## 2023-03-29 HISTORY — DX: Neoplasm of unspecified behavior of bladder: D49.4

## 2023-03-29 HISTORY — DX: Other specified health status: Z78.9

## 2023-03-29 HISTORY — DX: Urinary tract infection, site not specified: N39.0

## 2023-03-29 HISTORY — DX: Tobacco use: Z72.0

## 2023-03-29 HISTORY — DX: Unspecified osteoarthritis, unspecified site: M19.90

## 2023-03-29 HISTORY — DX: Unspecified hydronephrosis: N13.30

## 2023-03-29 HISTORY — DX: Occlusion and stenosis of bilateral carotid arteries: I65.23

## 2023-03-29 HISTORY — DX: Deficiency of other specified B group vitamins: E53.8

## 2023-03-29 HISTORY — DX: Gastro-esophageal reflux disease without esophagitis: K21.9

## 2023-03-29 HISTORY — DX: Other specified symptoms and signs involving the circulatory and respiratory systems: R09.89

## 2023-03-29 HISTORY — DX: Encounter for adoption services: Z02.82

## 2023-03-29 HISTORY — DX: Depression, unspecified: F32.A

## 2023-03-29 LAB — CULTURE, URINE COMPREHENSIVE

## 2023-03-29 NOTE — Patient Instructions (Addendum)
Your procedure is scheduled on:04-04-23 Thursday Report to the Registration Desk on the 1st floor of the Medical [facility].Then proceed to the 2nd floor Surgery Desk To find out your arrival time, please call [phone] between 1PM - 3PM on:04-03-23 Wednesday If your arrival time is 6:00 am, do not arrive before that time as the Medical Mall entrance doors do not open until 6:00 am.  REMEMBER: Instructions that are not followed completely may result in serious medical risk, up to and including death; or upon the discretion of your surgeon and anesthesiologist your surgery may need to be rescheduled.  Do not eat food OR drink any liquids after midnight the night before surgery.  No gum chewing or hard candies.  One week prior to surgery:Last dose 03-29-23 Stop Anti-inflammatories (NSAIDS) such as Advil, Aleve, Ibuprofen, Motrin, Naproxen, Naprosyn and Aspirin based products such as Excedrin, Goody's Powder, BC Powder.You may however, take Tylenol if needed for pain up until the day of surgery. Ok to continue your celecoxib (CELEBREX) up until the night prior to your surgery Stop ANY OVER THE COUNTER supplements/vitamins NOW (03-29-23) until after surgery..  Do NOT take any medication the morning of surgery  No Alcohol for 24 hours before or after surgery.  No Smoking including e-cigarettes for 24 hours before surgery.  No chewable tobacco products for at least 6 hours before surgery.  No nicotine patches on the day of surgery.  Do not use any "recreational" drugs for at least a week (preferably 2 weeks) before your surgery.  Please be advised that the combination of cocaine and anesthesia may have negative outcomes, up to and including death. If you test positive for cocaine, your surgery will be cancelled.  On the morning of surgery brush your teeth with toothpaste and water, you may rinse your mouth with mouthwash if you wish. Do not swallow any toothpaste or mouthwash.  Do not wear jewelry,  make-up, hairpins, clips or nail polish.  Do not wear lotions, powders, or perfumes.   Do not shave body hair from the neck down 48 hours before surgery.  Contact lenses, hearing aids and dentures may not be worn into surgery.  Do not bring valuables to the hospital. [facility] is not responsible for any missing/lost belongings or valuables.   Notify your doctor if there is any change in your medical condition (cold, fever, infection).  Wear comfortable clothing (specific to your surgery type) to the hospital.  After surgery, you can help prevent lung complications by doing breathing exercises.  Take deep breaths and cough every 1-2 hours. Your doctor may order a device called an Incentive Spirometer to help you take deep breaths. When coughing or sneezing, hold a pillow firmly against your incision with both hands. This is called "splinting." Doing this helps protect your incision. It also decreases belly discomfort.  If you are being admitted to the hospital overnight, leave your suitcase in the car. After surgery it may be brought to your room.  In case of increased patient census, it may be necessary for you, the patient, to continue your postoperative care in the Same Day Surgery department.  If you are being discharged the day of surgery, you will not be allowed to drive home. You will need a responsible individual to drive you home and stay with you for 24 hours after surgery.   If you are taking public transportation, you will need to have a responsible individual with you.  Please call the Pre-admissions Testing Dept. at [phone] if you have any questions about these instructions.  Surgery Visitation Policy:  Patients having surgery or a procedure may have two visitors.  Children under the age of 28 must have an adult with them who is not the patient.

## 2023-04-04 ENCOUNTER — Ambulatory Visit: Payer: Managed Care, Other (non HMO)

## 2023-04-04 ENCOUNTER — Encounter: Payer: Self-pay | Admitting: Urology

## 2023-04-04 ENCOUNTER — Ambulatory Visit
Admission: RE | Admit: 2023-04-04 | Discharge: 2023-04-04 | Disposition: A | Discharge status: Home / Self Care | Payer: Managed Care, Other (non HMO) | Source: Ambulatory Visit | Attending: Urology | Admitting: Urology

## 2023-04-04 ENCOUNTER — Ambulatory Visit: Payer: Managed Care, Other (non HMO) | Admitting: Anesthesiology

## 2023-04-04 ENCOUNTER — Encounter
Admission: RE | Disposition: A | Discharge status: Home / Self Care | Payer: Self-pay | Source: Ambulatory Visit | Attending: Urology

## 2023-04-04 DIAGNOSIS — K219 Gastro-esophageal reflux disease without esophagitis: Secondary | ICD-10-CM | POA: Diagnosis not present

## 2023-04-04 DIAGNOSIS — C679 Malignant neoplasm of bladder, unspecified: Secondary | ICD-10-CM

## 2023-04-04 DIAGNOSIS — F17200 Nicotine dependence, unspecified, uncomplicated: Secondary | ICD-10-CM | POA: Diagnosis not present

## 2023-04-04 DIAGNOSIS — D494 Neoplasm of unspecified behavior of bladder: Secondary | ICD-10-CM

## 2023-04-04 DIAGNOSIS — F419 Anxiety disorder, unspecified: Secondary | ICD-10-CM | POA: Diagnosis not present

## 2023-04-04 DIAGNOSIS — N131 Hydronephrosis with ureteral stricture, not elsewhere classified: Secondary | ICD-10-CM | POA: Diagnosis not present

## 2023-04-04 DIAGNOSIS — C678 Malignant neoplasm of overlapping sites of bladder: Secondary | ICD-10-CM | POA: Insufficient documentation

## 2023-04-04 DIAGNOSIS — J449 Chronic obstructive pulmonary disease, unspecified: Secondary | ICD-10-CM | POA: Insufficient documentation

## 2023-04-04 HISTORY — PX: CYSTOSCOPY W/ URETERAL STENT PLACEMENT: SHX1429

## 2023-04-04 HISTORY — PX: TRANSURETHRAL RESECTION OF BLADDER TUMOR: SHX2575

## 2023-04-04 SURGERY — CYSTOSCOPY, WITH RETROGRADE PYELOGRAM AND URETERAL STENT INSERTION
Anesthesia: General

## 2023-04-04 MED ORDER — OXYBUTYNIN CHLORIDE 5 MG PO TABS
5.0000 mg | ORAL_TABLET | Freq: Once | ORAL | Status: AC
  Administered 2023-04-04: 5 mg via ORAL
  Filled 2023-04-04: qty 1

## 2023-04-04 MED ORDER — SUCCINYLCHOLINE CHLORIDE 200 MG/10ML IV SOSY
PREFILLED_SYRINGE | INTRAVENOUS | Status: DC | PRN
  Administered 2023-04-04: 80 mg via INTRAVENOUS

## 2023-04-04 MED ORDER — CHLORHEXIDINE GLUCONATE 0.12 % MT SOLN
15.0000 mL | Freq: Once | OROMUCOSAL | Status: AC
  Administered 2023-04-04: 15 mL via OROMUCOSAL

## 2023-04-04 MED ORDER — FAMOTIDINE 20 MG PO TABS
20.0000 mg | ORAL_TABLET | Freq: Once | ORAL | Status: AC
  Administered 2023-04-04: 20 mg via ORAL

## 2023-04-04 MED ORDER — FAMOTIDINE 20 MG PO TABS
ORAL_TABLET | ORAL | Status: AC
  Filled 2023-04-04: qty 1

## 2023-04-04 MED ORDER — CEFAZOLIN SODIUM-DEXTROSE 2-4 GM/100ML-% IV SOLN
INTRAVENOUS | Status: AC
  Filled 2023-04-04: qty 100

## 2023-04-04 MED ORDER — FENTANYL CITRATE (PF) 100 MCG/2ML IJ SOLN
INTRAMUSCULAR | Status: AC
  Filled 2023-04-04: qty 2

## 2023-04-04 MED ORDER — FENTANYL CITRATE (PF) 100 MCG/2ML IJ SOLN: 25.0000 ug | INTRAMUSCULAR | Status: DC | PRN

## 2023-04-04 MED ORDER — ROCURONIUM BROMIDE 100 MG/10ML IV SOLN
INTRAVENOUS | Status: DC | PRN
  Administered 2023-04-04: 30 mg via INTRAVENOUS

## 2023-04-04 MED ORDER — DEXAMETHASONE SODIUM PHOSPHATE 10 MG/ML IJ SOLN
INTRAMUSCULAR | Status: DC | PRN
  Administered 2023-04-04: 4 mg via INTRAVENOUS

## 2023-04-04 MED ORDER — ONDANSETRON HCL 4 MG/2ML IJ SOLN
INTRAMUSCULAR | Status: AC
  Filled 2023-04-04: qty 2

## 2023-04-04 MED ORDER — HYDROCODONE-ACETAMINOPHEN 5-325 MG PO TABS: 1.0000 | ORAL_TABLET | Freq: Four times a day (QID) | ORAL | 0 refills | Status: DC | PRN

## 2023-04-04 MED ORDER — ORAL CARE MOUTH RINSE: 15.0000 mL | Freq: Once | OROMUCOSAL | Status: AC

## 2023-04-04 MED ORDER — ONDANSETRON HCL 4 MG/2ML IJ SOLN
INTRAMUSCULAR | Status: DC | PRN
  Administered 2023-04-04: 4 mg via INTRAVENOUS

## 2023-04-04 MED ORDER — LIDOCAINE HCL (PF) 2 % IJ SOLN
INTRAMUSCULAR | Status: AC
  Filled 2023-04-04: qty 5

## 2023-04-04 MED ORDER — PROPOFOL 10 MG/ML IV BOLUS
INTRAVENOUS | Status: AC
  Filled 2023-04-04: qty 20

## 2023-04-04 MED ORDER — OXYCODONE HCL 5 MG PO TABS: 5.0000 mg | ORAL_TABLET | Freq: Once | ORAL | Status: DC | PRN

## 2023-04-04 MED ORDER — SUGAMMADEX SODIUM 200 MG/2ML IV SOLN
INTRAVENOUS | Status: DC | PRN
  Administered 2023-04-04: 200 mg via INTRAVENOUS

## 2023-04-04 MED ORDER — FENTANYL CITRATE (PF) 100 MCG/2ML IJ SOLN
INTRAMUSCULAR | Status: DC | PRN
  Administered 2023-04-04: 25 ug via INTRAVENOUS
  Administered 2023-04-04: 50 ug via INTRAVENOUS
  Administered 2023-04-04: 25 ug via INTRAVENOUS

## 2023-04-04 MED ORDER — LACTATED RINGERS IV SOLN: INTRAVENOUS | Status: DC

## 2023-04-04 MED ORDER — MIDAZOLAM HCL 2 MG/2ML IJ SOLN
INTRAMUSCULAR | Status: DC | PRN
  Administered 2023-04-04: 2 mg via INTRAVENOUS

## 2023-04-04 MED ORDER — OXYCODONE HCL 5 MG/5ML PO SOLN: 5.0000 mg | Freq: Once | ORAL | Status: DC | PRN

## 2023-04-04 MED ORDER — SUCCINYLCHOLINE CHLORIDE 200 MG/10ML IV SOSY
PREFILLED_SYRINGE | INTRAVENOUS | Status: AC
  Filled 2023-04-04: qty 10

## 2023-04-04 MED ORDER — LIDOCAINE HCL (CARDIAC) PF 100 MG/5ML IV SOSY
PREFILLED_SYRINGE | INTRAVENOUS | Status: DC | PRN
  Administered 2023-04-04: 80 mg via INTRAVENOUS

## 2023-04-04 MED ORDER — MIDAZOLAM HCL 2 MG/2ML IJ SOLN
INTRAMUSCULAR | Status: AC
  Filled 2023-04-04: qty 2

## 2023-04-04 MED ORDER — OXYBUTYNIN CHLORIDE 5 MG PO TABS
ORAL_TABLET | ORAL | Status: AC
  Filled 2023-04-04: qty 1

## 2023-04-04 MED ORDER — OXYBUTYNIN CHLORIDE 5 MG PO TABS: 5.0000 mg | ORAL_TABLET | Freq: Three times a day (TID) | ORAL | 0 refills | Status: DC | PRN

## 2023-04-04 MED ORDER — CHLORHEXIDINE GLUCONATE 0.12 % MT SOLN
OROMUCOSAL | Status: AC
  Filled 2023-04-04: qty 15

## 2023-04-04 MED ORDER — GEMCITABINE CHEMO FOR BLADDER INSTILLATION 2000 MG
2000.0000 mg | Freq: Once | INTRAVENOUS | Status: DC
  Filled 2023-04-04: qty 52.6

## 2023-04-04 MED ORDER — DEXAMETHASONE SODIUM PHOSPHATE 10 MG/ML IJ SOLN
INTRAMUSCULAR | Status: AC
  Filled 2023-04-04: qty 1

## 2023-04-04 MED ORDER — CEFAZOLIN SODIUM-DEXTROSE 2-4 GM/100ML-% IV SOLN
2.0000 g | INTRAVENOUS | Status: AC
  Administered 2023-04-04: 2 g via INTRAVENOUS

## 2023-04-04 MED ORDER — PROPOFOL 10 MG/ML IV BOLUS
INTRAVENOUS | Status: DC | PRN
  Administered 2023-04-04: 150 mg via INTRAVENOUS

## 2023-04-04 MED ORDER — SODIUM CHLORIDE 0.9 % IR SOLN
Status: DC | PRN
  Administered 2023-04-04: 6000 mL via INTRAVESICAL
  Administered 2023-04-04: 3000 mL via INTRAVESICAL
  Administered 2023-04-04: 6000 mL via INTRAVESICAL

## 2023-04-04 MED ORDER — ROCURONIUM BROMIDE 10 MG/ML (PF) SYRINGE
PREFILLED_SYRINGE | INTRAVENOUS | Status: AC
  Filled 2023-04-04: qty 10

## 2023-04-04 SURGICAL SUPPLY — 29 items
BAG DRAIN SIEMENS DORNER NS (MISCELLANEOUS) ×2 IMPLANT
BAG DRN NS LF (MISCELLANEOUS) ×2
BAG DRN RND TRDRP ANRFLXCHMBR (UROLOGICAL SUPPLIES) ×2
BAG URINE DRAIN 2000ML AR STRL (UROLOGICAL SUPPLIES) ×2 IMPLANT
BRUSH SCRUB EZ 1% IODOPHOR (MISCELLANEOUS) ×2 IMPLANT
CATH FOLEY 2WAY 5CC 16FR (CATHETERS) ×2
CATH URETL OPEN 5X70 (CATHETERS) ×2 IMPLANT
CATH URTH 16FR FL 2W BLN LF (CATHETERS) ×2 IMPLANT
DRSG TELFA 3X4 N-ADH STERILE (GAUZE/BANDAGES/DRESSINGS) ×2 IMPLANT
GLOVE BIO SURGEON STRL SZ 6.5 (GLOVE) ×2 IMPLANT
GOWN STRL REUS W/ TWL LRG LVL3 (GOWN DISPOSABLE) ×4 IMPLANT
GOWN STRL REUS W/ TWL XL LVL3 (GOWN DISPOSABLE) ×2 IMPLANT
GOWN STRL REUS W/TWL LRG LVL3 (GOWN DISPOSABLE) ×4
GOWN STRL REUS W/TWL XL LVL3 (GOWN DISPOSABLE) ×2
GUIDEWIRE STR DUAL SENSOR (WIRE) ×2 IMPLANT
IV NS IRRIG 3000ML ARTHROMATIC (IV SOLUTION) ×2 IMPLANT
KIT TURNOVER CYSTO (KITS) ×2 IMPLANT
LOOP CUT BIPOLAR 24F LRG (ELECTROSURGICAL) IMPLANT
NDL SAFETY ECLIP 18X1.5 (MISCELLANEOUS) ×2 IMPLANT
PACK CYSTO AR (MISCELLANEOUS) ×2 IMPLANT
PAD ARMBOARD 7.5X6 YLW CONV (MISCELLANEOUS) ×2 IMPLANT
SET CYSTO W/LG BORE CLAMP LF (SET/KITS/TRAYS/PACK) ×2 IMPLANT
SET IRRIG Y TYPE TUR BLADDER L (SET/KITS/TRAYS/PACK) ×2 IMPLANT
STENT URET 6FRX24 CONTOUR (STENTS) ×2 IMPLANT
STENT URET 6FRX26 CONTOUR (STENTS) ×2 IMPLANT
SURGILUBE 2OZ TUBE FLIPTOP (MISCELLANEOUS) ×2 IMPLANT
SYR TOOMEY IRRIG 70ML (MISCELLANEOUS) ×2
SYRINGE TOOMEY IRRIG 70ML (MISCELLANEOUS) ×2 IMPLANT
WATER STERILE IRR 500ML POUR (IV SOLUTION) ×2 IMPLANT

## 2023-04-04 NOTE — Op Note (Signed)
Date of procedure: 04/04/23  Preoperative diagnosis:  Bladder tumor Left ureteral obstruction/hydronephrosis  Postoperative diagnosis:  Same as above  Procedure: TURBT, large greater than 5 cm Left ureteral stent placement  Surgeon: Vanna Scotland, MD  Anesthesia: General  Complications: None  Intraoperative findings: Large irregular mucosal and what appears to be submucosal components of extensive tumor extending from left dome, left lateral bladder wall, the majority of the trigone including the left ureteral orifice and bladder neck.  Tumor measures at least 5 x 5 cm, likely greater.  Likely incomplete resection today.  EBL: Minimal  Specimens: Bladder tumor   Drains: 6 x 24 French double-J ureteral stent  Indication: Barbara [name] is a 62 y.o. patient with smoking history and hematuria found to have a large bladder tumor with left ureteral obstruction.  After reviewing the management options for treatment, she elected to proceed with the above surgical procedure(s). We have discussed the potential benefits and risks of the procedure, side effects of the proposed treatment, the likelihood of the patient achieving the goals of the procedure, and any potential problems that might occur during the procedure or recuperation. Informed consent has been obtained.  Description of procedure:  The patient was taken to the operating room and general anesthesia was induced.  The patient was placed in the dorsal lithotomy position, prepped and draped in the usual sterile fashion, and preoperative antibiotics were administered. A preoperative time-out was performed.   Female sounds were used to dilate the urethra.  A 26 French resectoscope was then introduced.  The bladder was carefully inspected.  Unfortunately, there is a very large tumor measuring greater than 5 cm x 5 cm extending from the dome of the bladder, involving the majority of the left lateral bladder wall, left posterior  bladder wall, the entirety of the left trigone Goen, bladder neck and mid trigonal areas.  It appeared nodular and thickened and almost submucosal in some areas.  The left UO was completely obstructed and not visible today.  Using saline as the medium, began resecting the tumor first of the trigone, then the left UO extending up the lateral bladder wall to the dome down the bladder wall and towards the bladder neck.  In some areas, the tumor was very firm and it was extremely difficult to assess if all the tumor had been resected.  There was 1 deeper resection on the left lateral bladder wall where there appeared to be a small almost diverticulum and I carefully navigated around this area into avoid perforation.  Careful and adequate hemostasis was achieved along the way and then at the end of the procedure sparing fulguration and around the orifice itself.  At the end of the procedure, the entirety of the surface of the bladder tumor was resected but there was concern for areas of deeper invasion however I elected not to continue to resect in these areas due to concern for perforation.  There is a very high suspicion for muscle invasive tumor based on her preoperative CT scan, obstruction of the UO, as well as clinical appearance of the tumor today and suspect that at this point in time, the resection hopefully is sufficient to make this diagnosis.  As such, elected to discontinue additional resection today.  I elected also not to proceed with intravesical gemcitabine due to 1 deeper area of resection and the overall surface area which is relatively great.  Attention was turned to the left resected ureteral orifice which after resection, prolapsed somewhat into  the bladder.  A wire was placed up to the level of the kidney fluoroscopically.  A 6 x 24 French double-J ureteral stent was then advanced quite easily up to the level of the kidney.  Once the wire was withdrawn, full coil was noted within the renal pelvis.   The bladder was inspected and there is no active or visible bleeding noted.  The bladder chips were evacuated.  The bladder was empty.  The patient was cleaned and drained, repositioned in supine position, reversed of anesthesia, and taken the PACU in stable condition.  There were no complications in this case.  Plan: Patient will follow-up next week to discuss her surgical pathology.  Vanna Scotland, M.D.

## 2023-04-04 NOTE — Interval H&P Note (Signed)
History and Physical Interval Note:  04/04/2023 1:37 PM  Barbara [name]  has presented today for surgery, with the diagnosis of Bladder Tumor.  The various methods of treatment have been discussed with the patient and family. After consideration of risks, benefits and other options for treatment, the patient has consented to  Procedure(s): CYSTOSCOPY WITH RETROGRADE PYELOGRAM/URETERAL STENT PLACEMENT (Left) TRANSURETHRAL RESECTION OF BLADDER TUMOR (TURBT) (N/A) BLADDER INSTILLATION OF GEMCITABINE (N/A) as a surgical intervention.  The patient's history has been reviewed, patient examined, no change in status, stable for surgery.  I have reviewed the patient's chart and labs.  Questions were answered to the patient's satisfaction.    RRR CTAB  Vanna Scotland

## 2023-04-04 NOTE — Anesthesia Preprocedure Evaluation (Signed)
Anesthesia Evaluation  Patient identified by MRN, date of birth, ID band Patient awake    Reviewed: Allergy & Precautions, NPO status , Patient's Chart, lab work & pertinent test results  History of Anesthesia Complications Negative for: history of anesthetic complications  Airway Mallampati: III  TM Distance: >3 FB Neck ROM: full    Dental  (+) Chipped, Poor Dentition   Pulmonary neg shortness of breath, COPD, Current Smoker and Patient abstained from smoking.   Pulmonary exam normal        Cardiovascular Exercise Tolerance: Good (-) angina (-) Past MI negative cardio ROS Normal cardiovascular exam     Neuro/Psych  Headaches PSYCHIATRIC DISORDERS       Neuromuscular disease    GI/Hepatic Neg liver ROS,GERD  Controlled,,  Endo/Other  negative endocrine ROS    Renal/GU Renal disease     Musculoskeletal   Abdominal   Peds  Hematology negative hematology ROS (+)   Anesthesia Other Findings Past Medical History: No date: Adopted No date: Allergic rhinitis No date: Anemia     Comment:  h/o No date: Anxiety No date: Arthritis No date: B12 deficiency No date: Bilateral carotid artery stenosis No date: Bilateral carotid bruits No date: Bladder tumor No date: Depression No date: Gallbladder polyp No date: GERD (gastroesophageal reflux disease) No date: Hydronephrosis of left kidney No date: Migraines No date: Tobacco abuse No date: UTI (urinary tract infection)  Past Surgical History: No date: ABDOMINAL HYSTERECTOMY No date: BLADDER SURGERY 2014: BREAST BIOPSY; Left     Comment:  neg No date: FOOT SURGERY; Right     Comment:  2009 12/05/2012: LAPAROSCOPIC APPENDECTOMY No date: NASAL SEPTUM SURGERY 03/03/2012: SISTRUNK PROCEDURE     Comment:  Thyroglossal duct cyst excision No date: TONSILLECTOMY  BMI    Body Mass Index: 20.77 kg/m      Reproductive/Obstetrics negative OB ROS                              Anesthesia Physical Anesthesia Plan  ASA: 3  Anesthesia Plan: General ETT   Post-op Pain Management:    Induction: Intravenous  PONV Risk Score and Plan: Ondansetron, Dexamethasone, Midazolam and Treatment may vary due to age or medical condition  Airway Management Planned: Oral ETT  Additional Equipment:   Intra-op Plan:   Post-operative Plan: Extubation in OR  Informed Consent: I have reviewed the patients History and Physical, chart, labs and discussed the procedure including the risks, benefits and alternatives for the proposed anesthesia with the patient or authorized representative who has indicated his/her understanding and acceptance.     Dental Advisory Given  Plan Discussed with: Anesthesiologist, CRNA and Surgeon  Anesthesia Plan Comments: (Patient consented for risks of anesthesia including but not limited to:  - adverse reactions to medications - damage to eyes, teeth, lips or other oral mucosa - nerve damage due to positioning  - sore throat or hoarseness - Damage to heart, brain, nerves, lungs, other parts of body or loss of life  Patient voiced understanding.)       Anesthesia Quick Evaluation

## 2023-04-04 NOTE — Anesthesia Procedure Notes (Signed)
Procedure Name: Intubation Date/Time: 04/04/2023 2:04 PM  Performed by: Emeterio Reeve, CRNAPre-anesthesia Checklist: Patient identified, Emergency Drugs available, Suction available and Patient being monitored Patient Re-evaluated:Patient Re-evaluated prior to induction Oxygen Delivery Method: Circle system utilized Preoxygenation: Pre-oxygenation with 100% oxygen Induction Type: IV induction Ventilation: Mask ventilation without difficulty Laryngoscope Size: Mac and 4 Grade View: Grade I Tube type: Oral Tube size: 7.0 mm Number of attempts: 1 Airway Equipment and Method: Stylet and Oral airway Placement Confirmation: ETT inserted through vocal cords under direct vision, positive ETCO2 and breath sounds checked- equal and bilateral Secured at: 22 cm Tube secured with: Tape Dental Injury: Teeth and Oropharynx as per pre-operative assessment  Comments: Cords clear. Ca

## 2023-04-04 NOTE — Transfer of Care (Signed)
Immediate Anesthesia Transfer of Care Note  Patient: Barbara [name]  Procedure(s) Performed: CYSTOSCOPY WITH RETROGRADE PYELOGRAM/URETERAL STENT PLACEMENT (Left) TRANSURETHRAL RESECTION OF BLADDER TUMOR (TURBT)  Patient Location: PACU  Anesthesia Type:General  Level of Consciousness: awake, drowsy, and patient cooperative  Airway & Oxygen Therapy: Patient Spontanous Breathing and Patient connected to face mask oxygen  Post-op Assessment: Report given to RN and Post -op Vital signs reviewed and stable  Post vital signs: Reviewed and stable  Last Vitals:  Vitals Value Taken Time  BP 157/83 04/04/23 1515  Temp    Pulse 91 04/04/23 1516  Resp 16 04/04/23 1516  SpO2 100 % 04/04/23 1516  Vitals shown include unvalidated device data.  Last Pain:  Vitals:   04/04/23 1020  TempSrc: Oral         Complications: No notable events documented.

## 2023-04-04 NOTE — Discharge Instructions (Addendum)
You have a ureteral stent in place.  This is a tube that extends from your kidney to your bladder.  This may cause urinary bleeding, burning with urination, and urinary frequency.  Please call our office or present to the ED if you develop fevers >101 or pain which is not able to be controlled with oral pain medications.  You may be given either Flomax and/ or ditropan to help with bladder spasms and stent pain in addition to pain medications.    [facility] [address] [place], [place] [address] [phone]  Transurethral Resection of Bladder Tumor (TURBT) or Bladder Biopsy   Definition:  Transurethral Resection of the Bladder Tumor is a surgical procedure used to diagnose and remove tumors within the bladder. TURBT is the most common treatment for early stage bladder cancer.  General instructions:     Your recent bladder surgery requires very little post hospital care but some definite precautions.  Despite the fact that no skin incisions were used, the area around the bladder incisions are raw and covered with scabs to promote healing and prevent bleeding. Certain precautions are needed to insure that the scabs are not disturbed over the next 2-4 weeks while the healing proceeds.  Because the raw surface inside your bladder and the irritating effects of urine you may expect frequency of urination and/or urgency (a stronger desire to urinate) and perhaps even getting up at night more often. This will usually resolve or improve slowly over the healing period. You may see some blood in your urine over the first 6 weeks. Do not be alarmed, even if the urine was clear for a while. Get off your feet and drink lots of fluids until clearing occurs. If you start to pass clots or don't improve call us.  Diet:  You may return to your normal diet immediately. Because of the raw surface of your bladder, alcohol, spicy foods, foods high in acid and drinks with  caffeine may cause irritation or frequency and should be used in moderation. To keep your urine flowing freely and avoid constipation, drink plenty of fluids during the day (8-10 glasses). Tip: Avoid cranberry juice because it is very acidic.  Activity:  Your physical activity doesn't need to be restricted. However, if you are very active, you may see some blood in the urine. We suggest that you reduce your activity under the circumstances until the bleeding has stopped.  Bowels:  It is important to keep your bowels regular during the postoperative period. Straining with bowel movements can cause bleeding. A bowel movement every other day is reasonable. Use a mild laxative if needed, such as milk of magnesia 2-3 tablespoons, or 2 Dulcolax tablets. Call if you continue to have problems. If you had been taking narcotics for pain, before, during or after your surgery, you may be constipated. Take a laxative if necessary.    AMBULATORY SURGERY  DISCHARGE INSTRUCTIONS   The drugs that you were given will stay in your system until tomorrow so for the next 24 hours you should not:  Drive an automobile Make any legal decisions Drink any alcoholic beverage   You may resume regular meals tomorrow.  Today it is better to start with liquids and gradually work up to solid foods.  You may eat anything you prefer, but it is better to start with liquids, then soup and crackers, and gradually work up to solid foods.   Please notify your doctor immediately if you have  any unusual bleeding, trouble breathing, redness and pain at the surgery site, drainage, fever, or pain not relieved by medication.   Please contact your physician with any problems or Same Day Surgery at [phone], Monday through Friday 6 am to 4 pm, or [place] at [facility] number at [phone].    Medication:  You should resume your pre-surgery medications unless told not to. In addition you may be given an antibiotic  to prevent or treat infection. Antibiotics are not always necessary. All medication should be taken as prescribed until the bottles are finished unless you are having an unusual reaction to one of the drugs.   [facility] [place], [place] [address] [phone]

## 2023-04-04 NOTE — Anesthesia Postprocedure Evaluation (Signed)
Anesthesia Post Note  Patient: Vesta Mixer  Procedure(s) Performed: CYSTOSCOPY WITH RETROGRADE PYELOGRAM/URETERAL STENT PLACEMENT (Left) TRANSURETHRAL RESECTION OF BLADDER TUMOR (TURBT)  Patient location during evaluation: PACU Anesthesia Type: General Level of consciousness: awake and alert Pain management: pain level controlled Vital Signs Assessment: post-procedure vital signs reviewed and stable Respiratory status: spontaneous breathing, nonlabored ventilation and respiratory function stable Cardiovascular status: blood pressure returned to baseline and stable Postop Assessment: no apparent nausea or vomiting Anesthetic complications: no   No notable events documented.   Last Vitals:  Vitals:   04/04/23 1547 04/04/23 1607  BP: 138/74 139/71  Pulse: 78 74  Resp: 12 14  Temp:  36.5 C  SpO2: 99% 98%    Last Pain:  Vitals:   04/04/23 1607  TempSrc: Temporal  PainSc:                  Foye Deer

## 2023-04-05 ENCOUNTER — Encounter: Payer: Self-pay | Admitting: Urology

## 2023-04-10 ENCOUNTER — Ambulatory Visit: Payer: Managed Care, Other (non HMO) | Admitting: Urology

## 2023-04-10 VITALS — BP 127/78 | HR 64

## 2023-04-10 DIAGNOSIS — C679 Malignant neoplasm of bladder, unspecified: Secondary | ICD-10-CM | POA: Diagnosis not present

## 2023-04-10 DIAGNOSIS — N1330 Unspecified hydronephrosis: Secondary | ICD-10-CM

## 2023-04-10 NOTE — Progress Notes (Signed)
Barbara [name],acting as a scribe for Vanna Scotland, MD.,have documented all relevant documentation on the behalf of Vanna Scotland, MD,as directed by  Vanna Scotland, MD while in the presence of Vanna Scotland, MD.  04/10/2023 12:29 PM   Barbara [name] 10-12-1961 [id number]  Referring provider: Carlean Jews, PA-C [address] [place],  [place] [address]  Chief Complaint  Patient presents with   Follow-up    HPI: 62 year-old female who presents today for follow up after TURBT.   She was found to have a large bladder mass on CT urogram. Specifically, CT urogram showed marked leftward asymmetric thickening of the bladder wall up to 18 mm extending over the UVJ with marked perivesical straining and soft tissue concerning for neoplasm possibly with extra vesicle extension. She also had moderate left hydroureteronephrosis all the way down to the level of the UVJ. There was no lymphadenopathy or evidence of disease outside of the bladder.   She has no recent chest imaging.   She was taken to the operating room for a TURBT and ureteral stent placement on the left side on 04/04/2023. Intraoperatively, she had a very large, irregular, nodular, invasive appearing tumor, extending from the left dome, left lateral bladder wall, extending and including the majority of the trigone and including complete obstruction of the left UO all the way to the bladder neck.  I am highly suspicious that the resection was incomplete. The left UO was ultimately able to be identified after resection and a place a left ureteral stent was placed.   Interestingly, surgical pathology is consistent with low-grade invasive urothelial carcinoma, specifically invading the muscularis propria. No lymphovascular invasion was identified.   Today, she reports hematuria, discomfort from the stent, and occasional bladder spasms.   PMH: Past Medical History:  Diagnosis Date   Adopted    Allergic rhinitis    Anemia     h/o   Anxiety    Arthritis    B12 deficiency    Bilateral carotid artery stenosis    Bilateral carotid bruits    Bladder tumor    Depression    Gallbladder polyp    GERD (gastroesophageal reflux disease)    Hydronephrosis of left kidney    Migraines    Tobacco abuse    UTI (urinary tract infection)     Surgical History: Past Surgical History:  Procedure Laterality Date   ABDOMINAL HYSTERECTOMY     BLADDER SURGERY     BREAST BIOPSY Left 2014   neg   CYSTOSCOPY W/ URETERAL STENT PLACEMENT Left 04/04/2023   Procedure: CYSTOSCOPY WITH RETROGRADE PYELOGRAM/URETERAL STENT PLACEMENT;  Surgeon: Vanna Scotland, MD;  Location: [facility] ORS;  Service: Urology;  Laterality: Left;   FOOT SURGERY Right    2009   LAPAROSCOPIC APPENDECTOMY  12/05/2012   NASAL SEPTUM SURGERY     SISTRUNK PROCEDURE  03/03/2012   Thyroglossal duct cyst excision   TONSILLECTOMY     TRANSURETHRAL RESECTION OF BLADDER TUMOR N/A 04/04/2023   Procedure: TRANSURETHRAL RESECTION OF BLADDER TUMOR (TURBT);  Surgeon: Vanna Scotland, MD;  Location: [facility] ORS;  Service: Urology;  Laterality: N/A;    Home Medications:  Allergies as of 04/10/2023       Reactions   Pseudoephedrine Hcl Other (See Comments)   "Jittery"        Medication List        Accurate as of April 10, 2023 12:29 PM. If you have any questions, ask your nurse or doctor.  celecoxib 200 MG capsule Commonly known as: CELEBREX TAKE 1 CAPSULE BY MOUTH DAILY What changed: when to take this   estradiol 1 MG tablet Commonly known as: ESTRACE TAKE 1 TABLET BY MOUTH DAILY   HYDROcodone-acetaminophen 5-325 MG tablet Commonly known as: NORCO/VICODIN Take 1-2 tablets by mouth every 6 (six) hours as needed for moderate pain.   ibuprofen 200 MG tablet Commonly known as: ADVIL Take 800 mg by mouth 2 (two) times daily.   oxybutynin 5 MG tablet Commonly known as: DITROPAN Take 1 tablet (5 mg total) by mouth every 8 (eight) hours as needed  for bladder spasms.   topiramate 50 MG tablet Commonly known as: TOPAMAX Take 2 tablets (100 mg total) by mouth daily. What changed: when to take this   trimethoprim 100 MG tablet Commonly known as: TRIMPEX Take 1 tablet (100 mg total) by mouth daily. What changed: when to take this        Allergies:  Allergies  Allergen Reactions   Pseudoephedrine Hcl Other (See Comments)    "Jittery"    Family History: Family History  Adopted: Yes    Social History:  reports that she has been smoking cigarettes. She has a 7.50 pack-year smoking history. She has been exposed to tobacco smoke. She has never used smokeless tobacco. She reports that she does not drink alcohol and does not use drugs.   Physical Exam: BP 127/78   Pulse 64   Constitutional:  Alert and oriented, No acute distress. HEENT: [place] AT, moist mucus membranes.  Trachea midline, no masses. Neurologic: Grossly intact, no focal deficits, moving all 4 extremities. Psychiatric: Normal mood and affect.  Assessment & Plan:    1. Muscle invasive bladder cancer, low-grade - We will complete her staging with CT of the chest without contrast to rule out metastatic disease - Refer her for consideration of neoadjuvant chemotherapy at Hem/Onc at [place] - Refer to Dr. Berneice Heinrich in [place] for a surgical consultation regarding cystectomy - Discuss the potential for an ileal conduit versus neobladder, with a preference for the former due to the tumor's location and extent. -Given her relatively young age, would not recommend radiation for primary treatment especially based on the extent of her disease and concern for possibly T3 disease -Tearful today, conversation was lengthy.  2. Left hydronephrosis - We will leave the stent in given the obstruction - Discussed continuing the bladder spasm medications as needed, despite side effects. - Consider alternative medications like mirabegron or gemtesa if side effects become  intolerable - Advised the use of ibuprofen for pain management as needed  Return for CT of chest without contrast.  I have reviewed the above documentation for accuracy and completeness, and I agree with the above.   Vanna Scotland, MD   [facility] [address], [address] [place], [place] [address] [phone]  I spent 43 total minutes on the day of the encounter including pre-visit review of the medical record, face-to-face time with the patient, and post visit ordering of labs/imaging/tests.

## 2023-04-12 ENCOUNTER — Encounter: Payer: Self-pay | Admitting: Internal Medicine

## 2023-04-12 ENCOUNTER — Inpatient Hospital Stay: Payer: Managed Care, Other (non HMO) | Attending: Internal Medicine | Admitting: Internal Medicine

## 2023-04-12 ENCOUNTER — Inpatient Hospital Stay: Payer: Managed Care, Other (non HMO)

## 2023-04-12 VITALS — BP 136/66 | HR 57 | Temp 97.6°F | Wt 124.4 lb

## 2023-04-12 DIAGNOSIS — N1330 Unspecified hydronephrosis: Secondary | ICD-10-CM | POA: Insufficient documentation

## 2023-04-12 DIAGNOSIS — C679 Malignant neoplasm of bladder, unspecified: Secondary | ICD-10-CM | POA: Insufficient documentation

## 2023-04-12 DIAGNOSIS — N179 Acute kidney failure, unspecified: Secondary | ICD-10-CM | POA: Insufficient documentation

## 2023-04-12 DIAGNOSIS — K219 Gastro-esophageal reflux disease without esophagitis: Secondary | ICD-10-CM | POA: Insufficient documentation

## 2023-04-12 DIAGNOSIS — Z79899 Other long term (current) drug therapy: Secondary | ICD-10-CM | POA: Diagnosis not present

## 2023-04-12 DIAGNOSIS — F17210 Nicotine dependence, cigarettes, uncomplicated: Secondary | ICD-10-CM | POA: Insufficient documentation

## 2023-04-12 DIAGNOSIS — C689 Malignant neoplasm of urinary organ, unspecified: Secondary | ICD-10-CM

## 2023-04-12 LAB — COMPREHENSIVE METABOLIC PANEL
ALT: 9 U/L (ref 0–44)
AST: 14 U/L — ABNORMAL LOW (ref 15–41)
Albumin: 3.9 g/dL (ref 3.5–5.0)
Alkaline Phosphatase: 33 U/L — ABNORMAL LOW (ref 38–126)
Anion gap: 8 (ref 5–15)
BUN: 26 mg/dL — ABNORMAL HIGH (ref 8–23)
CO2: 22 mmol/L (ref 22–32)
Calcium: 8.7 mg/dL — ABNORMAL LOW (ref 8.9–10.3)
Chloride: 108 mmol/L (ref 98–111)
Creatinine, Ser: 1.18 mg/dL — ABNORMAL HIGH (ref 0.44–1.00)
GFR, Estimated: 52 mL/min — ABNORMAL LOW (ref >60)
Glucose, Bld: 81 mg/dL (ref 70–99)
Potassium: 4.2 mmol/L (ref 3.5–5.1)
Sodium: 138 mmol/L (ref 135–145)
Total Bilirubin: 0.7 mg/dL (ref 0.3–1.2)
Total Protein: 7 g/dL (ref 6.5–8.1)

## 2023-04-12 LAB — CBC WITH DIFFERENTIAL/PLATELET
Abs Immature Granulocytes: 0.03 10*3/uL (ref 0.00–0.07)
Basophils Absolute: 0 10*3/uL (ref 0.0–0.1)
Basophils Relative: 1 %
Eosinophils Absolute: 0.4 10*3/uL (ref 0.0–0.5)
Eosinophils Relative: 7 %
HCT: 36.2 % (ref 36.0–46.0)
Hemoglobin: 12.1 g/dL (ref 12.0–15.0)
Immature Granulocytes: 1 %
Lymphocytes Relative: 34 %
Lymphs Abs: 2.1 10*3/uL (ref 0.7–4.0)
MCH: 32.2 pg (ref 26.0–34.0)
MCHC: 33.4 g/dL (ref 30.0–36.0)
MCV: 96.3 fL (ref 80.0–100.0)
Monocytes Absolute: 0.5 10*3/uL (ref 0.1–1.0)
Monocytes Relative: 8 %
Neutro Abs: 3.2 10*3/uL (ref 1.7–7.7)
Neutrophils Relative %: 49 %
Platelets: 219 10*3/uL (ref 150–400)
RBC: 3.76 MIL/uL — ABNORMAL LOW (ref 3.87–5.11)
RDW: 12.7 % (ref 11.5–15.5)
WBC: 6.2 10*3/uL (ref 4.0–10.5)
nRBC: 0 % (ref 0.0–0.2)

## 2023-04-12 MED ORDER — HYDROCODONE-ACETAMINOPHEN 5-325 MG PO TABS: 1.0000 | ORAL_TABLET | Freq: Four times a day (QID) | ORAL | 0 refills | Status: DC | PRN

## 2023-04-12 NOTE — Progress Notes (Signed)
The patient's Urologist prescribed her to take Trimethoprim for a couple of years due to her having continuous UTI's. Well they are now scheduling her to have her bladder removed so she wants to see if it is even necessary to keep taking those antibiotics? Patient had surgical pathology on 04/04/2023. She is also scheduled for a CT Scan (Chest), but they still have not got in touch with her to schedule it yet.

## 2023-04-12 NOTE — Progress Notes (Signed)
[facility] CONSULT NOTE  Patient Care Team: McDonough, Renard Matter as PCP - General (Physician Assistant)  REFERRING PROVIDER: Dr. Apolinar Junes  REASON FOR REFFERAL: Muscle invasive bladder cancer  CANCER STAGING   Cancer Staging  Urothelial cancer [facility]) Staging form: Kidney, AJCC 8th Edition - Clinical: cT3, cN0 - Signed by Michaelyn Barter, MD on 04/12/2023 Stage prefix: Initial diagnosis   ASSESSMENT & PLAN:  Barbara [name] 62 y.o. female with pmh of anxiety, GERD, right carotid artery stenosis was referred to medical oncology for new diagnosis of muscle invasive urothelial cancer.  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  Was treated with multiple rounds of antibiotics with no improvement.  She was then referred to Dr. Apolinar Junes for further evaluation.  - CTU from 03/22/2023 showed moderate left hydroureteronephrosis to the UVJ, marked leftward asymmetric wall thickening of the urinary bladder measuring 18 mm in maximal thickness which extends over the UVJ.  Perivesical stranding soft tissue.  No evidence of upper tract or abdominopelvic metastatic disease.  - s/p TURBT with left ureteral stent placement by Dr. Apolinar Junes.  IntraOp findings showed large irregular mucosal submucosal extensive tumor from the left dome, left lateral bladder wall majority of trigone including the left UO and bladder neck.  Measures at least 5 x 5 cm. Pathology showed invasive high-grade urothelial carcinoma.  There is some discrepancy.  Lower down in the report mentions low-grade.  Invades muscularis propria.  -CT chest is ordered by urology to complete the staging.  I discussed with the patient about neoadjuvant chemotherapy followed by cystectomy followed by adjuvant nivolumab if there is considerable residual disease.  Both options for  cisplatin/gemcitabine and ddMVAC were discussed with the patient.  Recent data from VESPES trial supports use of DD MVAC as neoadjuvant chemotherapy in fit young patients due to improved overall survival and PFS.  Patient is interested in [facility].  She has excellent performance status.  Side effects were discussed in length with her including but not limited to decreased blood count, increased risk of infection, anemia, need for blood transfusion, ototoxicity, neurotoxicity, nephrotoxicity, fatigue, hair thinning, nausea, vomiting, decreased appetite, cardiotoxicity.    - Her creatinine was 1.60 from 3 weeks ago.  I repeated the blood work today.  Her creatinine has improved to 1.18.  But calculated creatinine clearance is 44.  For patients with creatinine clearance less than 50 are at high risk for nephrotoxicity from cisplatin.  I called the patient back and informed her about the lab results.  She is taking ibuprofen 800 mg twice a day and celecoxib for left-sided pelvic pain.  I have advised her to stop NSAIDs.  She can use Tylenol.  If Tylenol does not help with the pain, she will use the Vicodin.  I have sent 20 pills.  Repeat BMP in 1 week.  If her kidney function does not improve, she will be considered cisplatin ineligible and may have to proceed with upfront cystectomy.  She is already referred to Dr. Berneice Heinrich at [place].  In the meantime, she will proceed with chemotherapy teach.  Will also obtain echocardiogram.  Port placement has been requested which we will plan to schedule after her repeat BMP next week.    Orders Placed This Encounter  Procedures   IR IMAGING GUIDED PORT INSERTION    Standing Status:   Future  Standing Expiration Date:   04/11/2024    Order Specific Question:   Reason for Exam (SYMPTOM  OR DIAGNOSIS REQUIRED)    Answer:   Chemotherapy    Order Specific Question:   Preferred Imaging Location?    Answer:   [facility]   CBC with Differential/Platelet    Standing  Status:   Future    Number of Occurrences:   1    Standing Expiration Date:   04/11/2024   Comprehensive metabolic panel    Standing Status:   Future    Number of Occurrences:   1    Standing Expiration Date:   04/11/2024   Basic Metabolic Panel - Cancer Center Only    Standing Status:   Future    Standing Expiration Date:   04/11/2024   ECHOCARDIOGRAM COMPLETE    Standing Status:   Future    Standing Expiration Date:   04/11/2024    Order Specific Question:   Where should this test be performed    Answer:   [facility]    Order Specific Question:   Perflutren DEFINITY (image enhancing agent) should be administered unless hypersensitivity or allergy exist    Answer:   Administer Perflutren    Order Specific Question:   Reason for exam-Echo    Answer:   Chemo  [id number]    Order Specific Question:   Other Comments    Answer:   cardiotoxic chemo    The total time spent in the appointment was 55 minutes encounter with patients including review of chart and various tests results, discussions about plan of care and coordination of care plan   All questions were answered. The patient knows to call the clinic with any problems, questions or concerns. No barriers to learning was detected.  Michaelyn Barter, MD 6/21/20244:10 PM   HISTORY OF PRESENTING ILLNESS:  Barbara [name] 62 y.o. female with pmh of pmh of anxiety, GERD, right carotid artery stenosis was referred to medical oncology for new diagnosis of muscle invasive urothelial cancer.  Patient develops urinary symptoms around September or October 2023.  Was treated with multiple rounds of antibiotics with no improvement.  She was then referred to Dr. Apolinar Junes for further evaluation.  CTU from 03/22/2023 showed moderate left hydroureteronephrosis to the UVJ, marked leftward asymmetric wall thickening of the urinary bladder measuring 18 mm in maximal thickness which extends over the UVJ.  Perivesical stranding soft tissue.  No evidence of  upper tract or abdominopelvic metastatic disease.  s/p TURBT with left ureteral stent placement by Dr. Apolinar Junes.  IntraOp findings showed large irregular mucosal submucosal extensive tumor from the left dome, left lateral bladder wall majority of trigone including the left UO and bladder neck.  Measures at least 5 x 5 cm.  Pathology showed invasive high-grade urothelial carcinoma.  There is some discrepancy.  Lower down in the report mentions low-grade.  Invades muscularis propria.  I have reviewed her chart and materials related to her cancer extensively and collaborated history with the patient. Summary of oncologic history is as follows: Oncology History  Urothelial cancer (HCC)  04/12/2023 Initial Diagnosis   Urothelial cancer (HCC)   04/12/2023 Cancer Staging   Staging form: Kidney, AJCC 8th Edition - Clinical: cT3, cN0 - Signed by Michaelyn Barter, MD on 04/12/2023 Stage prefix: Initial diagnosis     MEDICAL HISTORY:  Past Medical History:  Diagnosis Date   Adopted    Allergic rhinitis    Anemia    h/o  Anxiety    Arthritis    B12 deficiency    Bilateral carotid artery stenosis    Bilateral carotid bruits    Bladder tumor    Depression    Gallbladder polyp    GERD (gastroesophageal reflux disease)    Hydronephrosis of left kidney    Migraines    Tobacco abuse    UTI (urinary tract infection)     SURGICAL HISTORY: Past Surgical History:  Procedure Laterality Date   ABDOMINAL HYSTERECTOMY     BLADDER SURGERY     BREAST BIOPSY Left 2014   neg   CYSTOSCOPY W/ URETERAL STENT PLACEMENT Left 04/04/2023   Procedure: CYSTOSCOPY WITH RETROGRADE PYELOGRAM/URETERAL STENT PLACEMENT;  Surgeon: Vanna Scotland, MD;  Location: [facility] ORS;  Service: Urology;  Laterality: Left;   FOOT SURGERY Right    2009   LAPAROSCOPIC APPENDECTOMY  12/05/2012   NASAL SEPTUM SURGERY     SISTRUNK PROCEDURE  03/03/2012   Thyroglossal duct cyst excision   TONSILLECTOMY     TRANSURETHRAL RESECTION  OF BLADDER TUMOR N/A 04/04/2023   Procedure: TRANSURETHRAL RESECTION OF BLADDER TUMOR (TURBT);  Surgeon: Vanna Scotland, MD;  Location: [facility] ORS;  Service: Urology;  Laterality: N/A;    SOCIAL HISTORY: Social History   Socioeconomic History   Marital status: Married    Spouse name: Not on file   Number of children: Not on file   Years of education: Not on file   Highest education level: Not on file  Occupational History   Not on file  Tobacco Use   Smoking status: Every Day    Packs/day: 0.25    Years: 30.00    Additional pack years: 0.00    Total pack years: 7.50    Types: Cigarettes    Passive exposure: Past   Smokeless tobacco: Never   Tobacco comments:    8-10 a day. Smokes when stressed.  Vaping Use   Vaping Use: Not on file  Substance and Sexual Activity   Alcohol use: Never   Drug use: Never   Sexual activity: Not on file  Other Topics Concern   Not on file  Social History Narrative   Not on file   Social Determinants of Health   Financial Resource Strain: Not on file  Food Insecurity: No Food Insecurity (04/12/2023)   Hunger Vital Sign    Worried About Running Out of Food in the Last Year: Never true    Ran Out of Food in the Last Year: Never true  Transportation Needs: No Transportation Needs (04/12/2023)   PRAPARE - Administrator, Civil Service (Medical): No    Lack of Transportation (Non-Medical): No  Physical Activity: Not on file  Stress: Not on file  Social Connections: Not on file  Intimate Partner Violence: Not At Risk (04/12/2023)   Humiliation, Afraid, Rape, and Kick questionnaire    Fear of Current or Ex-Partner: No    Emotionally Abused: No    Physically Abused: No    Sexually Abused: No    FAMILY HISTORY: Family History  Adopted: Yes    ALLERGIES:  is allergic to pseudoephedrine hcl.  MEDICATIONS:  Current Outpatient Medications  Medication Sig Dispense Refill   celecoxib (CELEBREX) 200 MG capsule TAKE 1 CAPSULE BY  MOUTH DAILY (Patient taking differently: Take 200 mg by mouth at bedtime.) 90 capsule 3   estradiol (ESTRACE) 1 MG tablet TAKE 1 TABLET BY MOUTH DAILY 90 tablet 3   ibuprofen (ADVIL) 200 MG tablet Take 800  mg by mouth 2 (two) times daily.     topiramate (TOPAMAX) 50 MG tablet Take 2 tablets (100 mg total) by mouth daily. (Patient taking differently: Take 100 mg by mouth at bedtime.) 180 tablet 3   HYDROcodone-acetaminophen (NORCO/VICODIN) 5-325 MG tablet Take 1 tablet by mouth every 6 (six) hours as needed for moderate pain. 20 tablet 0   oxybutynin (DITROPAN) 5 MG tablet Take 1 tablet (5 mg total) by mouth every 8 (eight) hours as needed for bladder spasms. (Patient not taking: Reported on 04/12/2023) 30 tablet 0   trimethoprim (TRIMPEX) 100 MG tablet Take 1 tablet (100 mg total) by mouth daily. (Patient not taking: Reported on 04/12/2023) 30 tablet 11   No current facility-administered medications for this visit.    REVIEW OF SYSTEMS:   Pertinent information mentioned in HPI All other systems were reviewed with the patient and are negative.  PHYSICAL EXAMINATION: ECOG PERFORMANCE STATUS: 1 - Symptomatic but completely ambulatory  Vitals:   04/12/23 1146  BP: 136/66  Pulse: (!) 57  Temp: 97.6 F (36.4 C)  SpO2: 100%   Filed Weights   04/12/23 1146  Weight: 124 lb 6.4 oz (56.4 kg)    GENERAL:alert, no distress and comfortable SKIN: skin color, texture, turgor are normal, no rashes or significant lesions EYES: normal, conjunctiva are pink and non-injected, sclera clear OROPHARYNX:no exudate, no erythema and lips, buccal mucosa, and tongue normal  NECK: supple, thyroid normal size, non-tender, without nodularity LYMPH:  no palpable lymphadenopathy in the cervical, axillary or inguinal LUNGS: clear to auscultation and percussion with normal breathing effort HEART: regular rate & rhythm and no murmurs and no lower extremity edema ABDOMEN:abdomen soft, non-tender and normal bowel  sounds Musculoskeletal:no cyanosis of digits and no clubbing  PSYCH: alert & oriented x 3 with fluent speech NEURO: no focal motor/sensory deficits  LABORATORY DATA:  I have reviewed the data as listed Lab Results  Component Value Date   WBC 6.2 04/12/2023   HGB 12.1 04/12/2023   HCT 36.2 04/12/2023   MCV 96.3 04/12/2023   PLT 219 04/12/2023   Recent Labs    10/04/22 1307 03/22/23 1742 04/12/23 1331  NA 140  --  138  K 4.4  --  4.2  CL 105  --  108  CO2 21  --  22  GLUCOSE 81  --  81  BUN 22  --  26*  CREATININE 1.26* 1.60* 1.18*  CALCIUM 9.3  --  8.7*  GFRNONAA  --   --  52*  PROT 6.9  --  7.0  ALBUMIN 4.5  --  3.9  AST 13  --  14*  ALT 7  --  9  ALKPHOS 39*  --  33*  BILITOT 0.4  --  0.7    RADIOGRAPHIC STUDIES: I have personally reviewed the radiological images as listed and agreed with the findings in the report. DG OR UROLOGY CYSTO IMAGE ([facility] ONLY)  Result Date: 04/04/2023 There is no interpretation for this exam.  This order is for images obtained during a surgical procedure.  Please See "Surgeries" Tab for more information regarding the procedure.   CT HEMATURIA WORKUP  Result Date: 03/24/2023 CLINICAL DATA:  Hematuria, frequent UTIs, left lower quadrant abdominal pain. * Tracking Code: BO * EXAM: CT ABDOMEN AND PELVIS WITHOUT AND WITH CONTRAST TECHNIQUE: Multidetector CT imaging of the abdomen and pelvis was performed following the standard protocol before and following the bolus administration of intravenous contrast. RADIATION DOSE REDUCTION: This  exam was performed according to the departmental dose-optimization program which includes automated exposure control, adjustment of the mA and/or kV according to patient size and/or use of iterative reconstruction technique. CONTRAST:  80mL OMNIPAQUE IOHEXOL 300 MG/ML  SOLN COMPARISON:  CT December 05, 2012 FINDINGS: Lower chest: No acute abnormality. Hepatobiliary: No suspicious hepatic lesion. Gallbladder is  unremarkable. No biliary ductal dilation. Common duct measures 6 mm, within normal limits for patient's age. Pancreas: No pancreatic ductal dilation or evidence of acute inflammation. Spleen: No splenomegaly or focal splenic lesion. Adrenals/Urinary Tract: Bilateral adrenal glands appear normal. Moderate left hydroureteronephrosis to the ureterovesicular junction. Marked leftward asymmetric wall thickening of the urinary bladder measuring 18 mm in maximal thickness which extends over the ureterovesicular junction. There is marked perivesicular stranding/soft tissue. No right-sided hydronephrosis. No solid enhancing renal mass. No suspicious filling defect identified within the opacified portions of the collecting systems or ureters on delayed imaging. Stomach/Bowel: No radiopaque enteric contrast material was administered. Wall thickening versus underdistention of the gastric antrum. No pathologic dilation of large or small bowel. Appendix is surgically absent. Moderate volume of formed stool in the colon. Vascular/Lymphatic: Aortic atherosclerosis. Ectasia of the infrarenal abdominal aorta measuring 2.4 cm requiring no independent imaging follow-up. No pathologically enlarged abdominal or pelvic lymph nodes. Reproductive: Status post hysterectomy. No adnexal masses. Other: No pneumoperitoneum.  No walled off fluid collection. Musculoskeletal: No aggressive lytic or blastic lesion of bone. IMPRESSION: 1. Marked leftward asymmetric wall thickening of the urinary bladder measuring 18 mm in maximal thickness which extends over the ureterovesicular junction. There is marked perivesicular stranding/soft tissue. Findings are concerning for urothelial neoplasm with possible extra vesicular extension. Recommend urology consult and correlation with cystoscopy. 2. Moderate left hydroureteronephrosis to the ureterovesicular junction secondary to the bladder mass extending over the ureterovesicular junction. 3. No evidence of  upper tract or abdominopelvic metastatic disease. 4. Wall thickening versus underdistention of the gastric antrum, correlate for symptoms of gastritis. 5.  Aortic Atherosclerosis (ICD10-I70.0). These results will be called to the ordering clinician or representative by the Radiologist Assistant, and communication documented in the PACS or [employer]. Electronically Signed   By: Maudry Mayhew M.D.   On: 03/24/2023 13:40

## 2023-04-15 ENCOUNTER — Encounter: Payer: Self-pay | Admitting: Urology

## 2023-04-16 ENCOUNTER — Inpatient Hospital Stay: Payer: Managed Care, Other (non HMO)

## 2023-04-16 ENCOUNTER — Other Ambulatory Visit: Payer: Self-pay | Admitting: Physician Assistant

## 2023-04-16 DIAGNOSIS — G43509 Persistent migraine aura without cerebral infarction, not intractable, without status migrainosus: Secondary | ICD-10-CM

## 2023-04-16 NOTE — Telephone Encounter (Signed)
Please review and send 

## 2023-04-19 ENCOUNTER — Inpatient Hospital Stay: Payer: Managed Care, Other (non HMO)

## 2023-04-19 ENCOUNTER — Telehealth: Payer: Self-pay | Admitting: *Deleted

## 2023-04-19 DIAGNOSIS — C679 Malignant neoplasm of bladder, unspecified: Secondary | ICD-10-CM | POA: Diagnosis not present

## 2023-04-19 LAB — BASIC METABOLIC PANEL - CANCER CENTER ONLY
Anion gap: 8 (ref 5–15)
BUN: 16 mg/dL (ref 8–23)
CO2: 24 mmol/L (ref 22–32)
Calcium: 9 mg/dL (ref 8.9–10.3)
Chloride: 105 mmol/L (ref 98–111)
Creatinine: 1.24 mg/dL — ABNORMAL HIGH (ref 0.44–1.00)
GFR, Estimated: 49 mL/min — ABNORMAL LOW (ref >60)
Glucose, Bld: 92 mg/dL (ref 70–99)
Potassium: 3.7 mmol/L (ref 3.5–5.1)
Sodium: 137 mmol/L (ref 135–145)

## 2023-04-19 NOTE — Telephone Encounter (Signed)
FMLA received and completed for this patient and will be sent to physician for signature

## 2023-04-22 ENCOUNTER — Ambulatory Visit: Payer: Managed Care, Other (non HMO) | Admitting: Urology

## 2023-04-22 ENCOUNTER — Other Ambulatory Visit: Payer: Self-pay | Admitting: *Deleted

## 2023-04-22 ENCOUNTER — Ambulatory Visit
Admission: RE | Admit: 2023-04-22 | Discharge: 2023-04-22 | Disposition: A | Discharge status: Home / Self Care | Payer: Managed Care, Other (non HMO) | Source: Ambulatory Visit | Attending: Urology | Admitting: Urology

## 2023-04-22 ENCOUNTER — Encounter: Payer: Self-pay | Admitting: Internal Medicine

## 2023-04-22 DIAGNOSIS — C679 Malignant neoplasm of bladder, unspecified: Secondary | ICD-10-CM

## 2023-04-22 NOTE — Progress Notes (Signed)
START ON PATHWAY REGIMEN - Bladder     A cycle is every 21 days:     Gemcitabine      Cisplatin   **Always confirm dose/schedule in your pharmacy ordering system**  Patient Characteristics: Pre-Cystectomy or Nonsurgical Candidate, M0 (Clinical Staging), cT2-4a, cN0-1, M0, Cystectomy Eligible, CrCl 50-59 mL/min, and Minimal or No Symptoms Therapeutic Status: Pre-Cystectomy or Nonsurgical Candidate, M0 (Clinical Staging) AJCC M Category: cM0 AJCC 8 Stage Grouping: IIIA AJCC T Category: cT3 AJCC N Category: cN0 Intent of Therapy: Curative Intent, Discussed with Patient

## 2023-04-22 NOTE — Addendum Note (Signed)
Addended byMichaelyn Barter on: 04/22/2023 04:24 PM   Modules accepted: Orders

## 2023-04-23 ENCOUNTER — Other Ambulatory Visit: Payer: Self-pay

## 2023-04-23 ENCOUNTER — Encounter: Payer: Self-pay | Admitting: Internal Medicine

## 2023-04-23 ENCOUNTER — Telehealth: Payer: Self-pay | Admitting: *Deleted

## 2023-04-23 NOTE — Telephone Encounter (Signed)
RN called and spoke with pt and informed her of per port a cath insertion appt for 05/03/23.  Instructed to arrive at the heart vascular dept at [facility] at 1230p for a 130p appt.  Pt also instructed she needs a driver and to have nothing to eat or drink by mouth after midnight prior to surgery. Pt verbalized understanding.    Pt also had some concerns about her stent and recent surgery stated she was still having some pain but had quit taking pain med and had not been taking oxybutynin. Pt denied any foul odor or cloudiness to urine output.  RN stated we could see her in [facility] but pt did not think she needed to at this time.  RN instructed pt she could use pain med as needed and also to start taking oxybutynin if symptom continued or worsened to call back for [facility] appt.  Pt verbalized understanding.

## 2023-04-23 NOTE — Telephone Encounter (Signed)
FMLA form completed and faxed back to [employer]. Receipt confirmation obtained Form copied for chart MyChart message sent to patient to inform that it has been done

## 2023-04-24 ENCOUNTER — Ambulatory Visit: Payer: Managed Care, Other (non HMO)

## 2023-04-29 ENCOUNTER — Ambulatory Visit: Payer: Managed Care, Other (non HMO) | Admitting: Urology

## 2023-05-01 ENCOUNTER — Other Ambulatory Visit: Payer: Self-pay | Admitting: Radiology

## 2023-05-01 DIAGNOSIS — Z01812 Encounter for preprocedural laboratory examination: Secondary | ICD-10-CM

## 2023-05-02 ENCOUNTER — Other Ambulatory Visit: Payer: Self-pay | Admitting: Radiology

## 2023-05-02 ENCOUNTER — Encounter: Payer: Self-pay | Admitting: Physician Assistant

## 2023-05-02 ENCOUNTER — Ambulatory Visit (INDEPENDENT_AMBULATORY_CARE_PROVIDER_SITE_OTHER): Payer: Managed Care, Other (non HMO) | Admitting: Physician Assistant

## 2023-05-02 VITALS — BP 115/60 | HR 80 | Temp 98.0°F | Resp 16 | Ht 65.5 in | Wt 122.4 lb

## 2023-05-02 DIAGNOSIS — C679 Malignant neoplasm of bladder, unspecified: Secondary | ICD-10-CM

## 2023-05-02 DIAGNOSIS — Z008 Encounter for other general examination: Secondary | ICD-10-CM

## 2023-05-02 DIAGNOSIS — E7800 Pure hypercholesterolemia, unspecified: Secondary | ICD-10-CM

## 2023-05-02 NOTE — Progress Notes (Signed)
Patient for IR Port Insertion on Friday 05/03/2023, I called and spoke with the patient on the phone and gave pre-procedure instructions. Pt was made aware to be here at 12:30p, NPO after MN prior to procedure as well as driver post procedure/recovery/discharge. Pt stated understanding.  Called 05/02/2023

## 2023-05-02 NOTE — Progress Notes (Signed)
[facility] [address] [place], [place] [address]  Internal MEDICINE  Office Visit Note  Patient Name: Barbara [name]  [id number]  [id number]  Date of Service: 05/08/2023  Chief Complaint  Patient presents with   Follow-up   Depression   Gastroesophageal Reflux    HPI Pt is here follow up -Unfortunately since last visit patient has been diagnosed with bladder cancer -she states the tumor was found to be too large to remove. Had a stent placed to help with renal function which started to decline and is now improving again -Gets port tomorrow, and then chemo starts tues  -States plan for surgery later in the Fall -She states she is doing well all things considered -On leave with work and can't do her biometric screening labs through onsite screening now and requests to have this filled out. Some labs already done, but will need lipids and cholesterol checked. Will fax form and notify patient once sent  Current Medication: Outpatient Encounter Medications as of 05/02/2023  Medication Sig   celecoxib (CELEBREX) 200 MG capsule TAKE 1 CAPSULE BY MOUTH DAILY (Patient not taking: Reported on 05/07/2023)   estradiol (ESTRACE) 1 MG tablet TAKE 1 TABLET BY MOUTH DAILY   HYDROcodone-acetaminophen (NORCO/VICODIN) 5-325 MG tablet Take 1 tablet by mouth every 6 (six) hours as needed for moderate pain.   ibuprofen (ADVIL) 200 MG tablet Take 800 mg by mouth 2 (two) times daily. (Patient not taking: Reported on 05/07/2023)   oxybutynin (DITROPAN) 5 MG tablet Take 1 tablet (5 mg total) by mouth every 8 (eight) hours as needed for bladder spasms.   topiramate (TOPAMAX) 50 MG tablet Take 2 tablets (100 mg total) by mouth at bedtime.   trimethoprim (TRIMPEX) 100 MG tablet Take 1 tablet (100 mg total) by mouth daily.   No facility-administered encounter medications on file as of 05/02/2023.    Surgical History: Past Surgical History:  Procedure Laterality Date   ABDOMINAL HYSTERECTOMY      BLADDER SURGERY     BREAST BIOPSY Left 2014   neg   CYSTOSCOPY W/ URETERAL STENT PLACEMENT Left 04/04/2023   Procedure: CYSTOSCOPY WITH RETROGRADE PYELOGRAM/URETERAL STENT PLACEMENT;  Surgeon: Vanna Scotland, MD;  Location: [facility] OR[facility];  Service: Urology;  Laterality: Left;   FOOT SURGERY Right    2009   IR IMAGING GUIDED PORT INSERTION  05/03/2023   LAPAROSCOPIC APPENDECTOMY  12/05/2012   NASAL SEPTUM SURGERY     SISTRUNK PROCEDURE  03/03/2012   Thyroglossal duct cyst excision   TONSILLECTOMY     TRANSURETHRAL RESECTION OF BLADDER TUMOR N/A 04/04/2023   Procedure: TRANSURETHRAL RESECTION OF BLADDER TUMOR (TURBT);  Surgeon: Vanna Scotland, MD;  Location: [facility];  Service: Urology;  Laterality: N/A;    Medical History: Past Medical History:  Diagnosis Date   Adopted    Allergic rhinitis    Anemia    h/o   Anxiety    Arthritis    B12 deficiency    Bilateral carotid artery stenosis    Bilateral carotid bruits    Bladder tumor    Depression    Gallbladder polyp    GERD (gastroesophageal reflux disease)    Hydronephrosis of left kidney    Migraines    Tobacco abuse    UTI (urinary tract infection)     Family History: Family History  Adopted: Yes    Social History   Socioeconomic History   Marital status: Married    Spouse name: Not on file  Number of children: Not on file   Years of education: Not on file   Highest education level: Not on file  Occupational History   Not on file  Tobacco Use   Smoking status: Former    Current packs/day: 0.25    Average packs/day: 0.3 packs/day for 30.0 years (7.5 ttl pk-yrs)    Types: Cigarettes    Passive exposure: Past   Smokeless tobacco: Never  Vaping Use   Vaping status: Never Used  Substance and Sexual Activity   Alcohol use: Never   Drug use: Never   Sexual activity: Not on file  Other Topics Concern   Not on file  Social History Narrative   Not on file   Social Determinants of Health   Financial  Resource Strain: Not on file  Food Insecurity: No Food Insecurity (04/12/2023)   Hunger Vital Sign    Worried About Running Out of Food in the Last Year: Never true    Ran Out of Food in the Last Year: Never true  Transportation Needs: No Transportation Needs (04/12/2023)   PRAPARE - Administrator, Civil Service (Medical): No    Lack of Transportation (Non-Medical): No  Physical Activity: Not on file  Stress: Not on file  Social Connections: Not on file  Intimate Partner Violence: Not At Risk (04/12/2023)   Humiliation, Afraid, Rape, and Kick questionnaire    Fear of Current or Ex-Partner: No    Emotionally Abused: No    Physically Abused: No    Sexually Abused: No      Review of Systems  Constitutional:  Negative for chills, fatigue and unexpected weight change.  HENT:  Negative for congestion, rhinorrhea, sneezing and sore throat.   Eyes:  Negative for redness.  Respiratory:  Negative for cough, chest tightness and shortness of breath.   Cardiovascular:  Negative for chest pain and palpitations.  Gastrointestinal:  Negative for abdominal pain, constipation, diarrhea, nausea and vomiting.  Genitourinary:  Negative for dysuria.  Musculoskeletal:  Positive for arthralgias. Negative for back pain, joint swelling and neck pain.  Skin:  Negative for rash.  Neurological: Negative.  Negative for tremors and numbness.  Hematological:  Negative for adenopathy. Does not bruise/bleed easily.  Psychiatric/Behavioral:  Negative for behavioral problems (Depression), sleep disturbance and suicidal ideas. The patient is nervous/anxious.     Vital Signs: BP 115/60   Pulse 80   Temp 98 F (36.7 C)   Resp 16   Ht 5' 5.5" (1.664 m)   Wt 122 lb 6.4 oz (55.5 kg)   SpO2 99%   BMI 20.06 kg/m    Physical Exam Vitals and nursing note reviewed.  Constitutional:      General: She is not in acute distress.    Appearance: Normal appearance. She is well-developed and normal weight.  She is not diaphoretic.  HENT:     Head: Normocephalic and atraumatic.     Mouth/Throat:     Pharynx: No oropharyngeal exudate.  Eyes:     Pupils: Pupils are equal, round, and reactive to light.  Neck:     Thyroid: No thyromegaly.     Vascular: No JVD.     Trachea: No tracheal deviation.  Cardiovascular:     Rate and Rhythm: Normal rate and regular rhythm.     Heart sounds: Normal heart sounds. No murmur heard.    No friction rub. No gallop.  Pulmonary:     Effort: Pulmonary effort is normal. No respiratory distress.  Breath sounds: No wheezing or rales.  Chest:     Chest wall: No tenderness.  Abdominal:     General: Bowel sounds are normal.     Palpations: Abdomen is soft.  Musculoskeletal:        General: Normal range of motion.     Cervical back: Normal range of motion and neck supple.  Lymphadenopathy:     Cervical: No cervical adenopathy.  Skin:    General: Skin is warm and dry.  Neurological:     Mental Status: She is alert and oriented to person, place, and time.     Cranial Nerves: No cranial nerve deficit.  Psychiatric:        Behavior: Behavior normal.        Thought Content: Thought content normal.        Judgment: Judgment normal.        Assessment/Plan: 1. Malignant neoplasm of urinary bladder, unspecified site [facility]) Followed by oncology  2. Encounter for biometric screening Will fax form once labs completed - Lipid Panel With LDL/HDL Ratio - Hgb A1C w/o eAG  3. Pure hypercholesterolemia - Lipid Panel With LDL/HDL Ratio   General Counseling: Ovie verbalizes understanding of the findings of todays visit and agrees with plan of treatment. I have discussed any further diagnostic evaluation that may be needed or ordered today. We also reviewed her medications today. she has been encouraged to call the office with any questions or concerns that should arise related to todays visit.    Orders Placed This Encounter  Procedures   Lipid Panel With  LDL/HDL Ratio   Hgb A1C w/o eAG    No orders of the defined types were placed in this encounter.   This patient was seen by Lynn Ito, PA-C in collaboration with Dr. Beverely Risen as a part of collaborative care agreement.   Total time spent:30 Minutes Time spent includes review of chart, medications, test results, and follow up plan with the patient.      Dr Lyndon Code Internal medicine

## 2023-05-03 ENCOUNTER — Encounter: Payer: Self-pay | Admitting: Radiology

## 2023-05-03 ENCOUNTER — Ambulatory Visit
Admission: RE | Admit: 2023-05-03 | Discharge: 2023-05-03 | Disposition: A | Discharge status: Home / Self Care | Payer: Managed Care, Other (non HMO) | Source: Ambulatory Visit | Attending: Internal Medicine | Admitting: Internal Medicine

## 2023-05-03 DIAGNOSIS — C679 Malignant neoplasm of bladder, unspecified: Secondary | ICD-10-CM | POA: Diagnosis present

## 2023-05-03 DIAGNOSIS — Z01812 Encounter for preprocedural laboratory examination: Secondary | ICD-10-CM

## 2023-05-03 HISTORY — PX: IR IMAGING GUIDED PORT INSERTION: IMG5740

## 2023-05-03 MED ORDER — LIDOCAINE-EPINEPHRINE 1 %-1:100000 IJ SOLN
15.0000 mL | Freq: Once | INTRAMUSCULAR | Status: AC
  Administered 2023-05-03: 15 mL via INTRADERMAL

## 2023-05-03 MED ORDER — SODIUM CHLORIDE 0.9 % IV SOLN
INTRAVENOUS | Status: DC
  Administered 2023-05-03: 1000 mL via INTRAVENOUS

## 2023-05-03 MED ORDER — LIDOCAINE-EPINEPHRINE 1 %-1:100000 IJ SOLN
INTRAMUSCULAR | Status: AC
  Filled 2023-05-03: qty 1

## 2023-05-03 MED ORDER — FENTANYL CITRATE (PF) 100 MCG/2ML IJ SOLN
INTRAMUSCULAR | Status: AC | PRN
  Administered 2023-05-03 (×2): 25 ug via INTRAVENOUS
  Administered 2023-05-03: 50 ug via INTRAVENOUS

## 2023-05-03 MED ORDER — FENTANYL CITRATE (PF) 100 MCG/2ML IJ SOLN
INTRAMUSCULAR | Status: AC
  Filled 2023-05-03: qty 2

## 2023-05-03 MED ORDER — HEPARIN SOD (PORK) LOCK FLUSH 100 UNIT/ML IV SOLN
INTRAVENOUS | Status: AC
  Filled 2023-05-03: qty 5

## 2023-05-03 MED ORDER — MIDAZOLAM HCL 5 MG/5ML IJ SOLN
INTRAMUSCULAR | Status: AC | PRN
  Administered 2023-05-03: .5 mg via INTRAVENOUS

## 2023-05-03 MED ORDER — MIDAZOLAM HCL 2 MG/2ML IJ SOLN
INTRAMUSCULAR | Status: AC | PRN
  Administered 2023-05-03: 1 mg via INTRAVENOUS
  Administered 2023-05-03: .5 mg via INTRAVENOUS

## 2023-05-03 MED ORDER — HEPARIN SOD (PORK) LOCK FLUSH 100 UNIT/ML IV SOLN
500.0000 [IU] | Freq: Once | INTRAVENOUS | Status: AC
  Administered 2023-05-03: 500 [IU] via INTRAVENOUS

## 2023-05-03 MED ORDER — MIDAZOLAM HCL 2 MG/2ML IJ SOLN
INTRAMUSCULAR | Status: AC
  Filled 2023-05-03: qty 2

## 2023-05-03 NOTE — Discharge Instructions (Signed)
Implanted Port Home Guide  An implanted port is a type of central line that is placed under the skin. Central lines are used to provide IV access when treatment or nutrition needs to be given through a person's veins. Implanted ports are used for long-term IV access. An implanted port may be placed because: You need IV medicine that would be irritating to the small veins in your hands or arms. You need long-term IV medicines, such as antibiotics. You need IV nutrition for a long period. You need frequent blood draws for lab tests. You need dialysis.   Implanted ports are usually placed in the chest area, but they can also be placed in the upper arm, the abdomen, or the leg. An implanted port has two main parts: Reservoir. The reservoir is round and will appear as a small, raised area under your skin. The reservoir is the part where a needle is inserted to give medicines or draw blood. Catheter. The catheter is a thin, flexible tube that extends from the reservoir. The catheter is placed into a large vein. Medicine that is inserted into the reservoir goes into the catheter and then into the vein.   How will I care for my incision  You may shower tomorrow Please remove dressing in 24hrs not other skin care is needed  How is my port accessed? Special steps must be taken to access the port: Before the port is accessed, a numbing cream can be placed on the skin. This helps numb the skin over the port site. Your health care provider uses a sterile technique to access the port. Your health care provider must put on a mask and sterile gloves. The skin over your port is cleaned carefully with an antiseptic and allowed to dry. The port is gently pinched between sterile gloves, and a needle is inserted into the port. Only "non-coring" port needles should be used to access the port. Once the port is accessed, a blood return should be checked. This helps ensure that the port is in the vein and is not  clogged. If your port needs to remain accessed for a constant infusion, a clear (transparent) bandage will be placed over the needle site. The bandage and needle will need to be changed every week, or as directed by your health care provider.   What is flushing? Flushing helps keep the port from getting clogged. Follow your health care provider's instructions on how and when to flush the port. Ports are usually flushed with saline solution or a medicine called heparin. The need for flushing will depend on how the port is used. If the port is used for intermittent medicines or blood draws, the port will need to be flushed: After medicines have been given. After blood has been drawn. As part of routine maintenance. If a constant infusion is running, the port may not need to be flushed.   How long will my port stay implanted? The port can stay in for as long as your health care provider thinks it is needed. When it is time for the port to come out, surgery will be done to remove it. The procedure is similar to the one performed when the port was put in. When should I seek immediate medical care? When you have an implanted port, you should seek immediate medical care if: You notice a bad smell coming from the incision site. You have swelling, redness, or drainage at the incision site. You have more swelling or pain at the   port site or the surrounding area. You have a fever that is not controlled with medicine.   This information is not intended to replace advice given to you by your health care provider. Make sure you discuss any questions you have with your health care provider. Document Released: 10/08/2005 Document Revised: 03/15/2016 Document Reviewed: 06/15/2013 Elsevier Interactive Patient Education  2017 Elsevier Inc.   

## 2023-05-03 NOTE — H&P (Signed)
Interventional Radiology - Pre-procedure H&P    Referring Provider: Michaelyn Barter, MD  Planned Procedure: Port placement     History of Present Illness  Barbara [name] is a 62 y.o. female with a relevant past medical history of bladder malignancy seen today for port placement.   Additional Past Medical History Past Medical History:  Diagnosis Date   Adopted    Allergic rhinitis    Anemia    h/o   Anxiety    Arthritis    B12 deficiency    Bilateral carotid artery stenosis    Bilateral carotid bruits    Bladder tumor    Depression    Gallbladder polyp    GERD (gastroesophageal reflux disease)    Hydronephrosis of left kidney    Migraines    Tobacco abuse    UTI (urinary tract infection)     Surgical History  Past Surgical History:  Procedure Laterality Date   ABDOMINAL HYSTERECTOMY     BLADDER SURGERY     BREAST BIOPSY Left 2014   neg   CYSTOSCOPY W/ URETERAL STENT PLACEMENT Left 04/04/2023   Procedure: CYSTOSCOPY WITH RETROGRADE PYELOGRAM/URETERAL STENT PLACEMENT;  Surgeon: Vanna Scotland, MD;  Location: [facility];  Service: Urology;  Laterality: Left;   FOOT SURGERY Right    2009   LAPAROSCOPIC APPENDECTOMY  12/05/2012   NASAL SEPTUM SURGERY     SISTRUNK PROCEDURE  03/03/2012   Thyroglossal duct cyst excision   TONSILLECTOMY     TRANSURETHRAL RESECTION OF BLADDER TUMOR N/A 04/04/2023   Procedure: TRANSURETHRAL RESECTION OF BLADDER TUMOR (TURBT);  Surgeon: Vanna Scotland, MD;  Location: [facility];  Service: Urology;  Laterality: N/A;     Medications  I have reviewed the current medication list. Refer to chart for details. Current Outpatient Medications  Medication Instructions   celecoxib (CELEBREX) 200 MG capsule TAKE 1 CAPSULE BY MOUTH DAILY   estradiol (ESTRACE) 1 MG tablet TAKE 1 TABLET BY MOUTH DAILY   HYDROcodone-acetaminophen (NORCO/VICODIN) 5-325 MG tablet 1 tablet, Oral, Every 6 hours PRN   ibuprofen (ADVIL) 800 mg, Oral, 2 times daily    oxybutynin (DITROPAN) 5 mg, Oral, Every 8 hours PRN   topiramate (TOPAMAX) 100 mg, Oral, Nightly   trimethoprim (TRIMPEX) 100 mg, Oral, Daily     Allergies Allergies  Allergen Reactions   Pseudoephedrine Hcl Other (See Comments)    "Jittery"   Does patient have contrast allergy: No     Physical Exam Current Vitals Temp: 97.6 F (36.4 C) (Temp Source: Oral)  Pulse Rate: 63  Resp: 16  BP: 139/74  SpO2: 100 %  Height: 5' 5.5" (166.4 cm)  Weight: 55.5 kg  Body mass index is 20.06 kg/m.  General: Alert and answers questions appropriately. No apparent distress. HEENT: Normocephalic, atraumatic. Conjunctivae normal without scleral icterus. Mallampati score: I (soft palate, uvula, fauces, and tonsillar pillars visible) Cardiac: Regular rate. No dependent edema. Pulmonary: Normal work of breathing. On room air. Abdominal: Soft. Extremities: Normally-formed, well perfused.    Pertinent Lab Results Labs: CBC:    BMP:   Coagulation:    CBC Trends: No results for input(s): "WBC", "HGB", "HCT", "PLT" in the last 72 hours.   Creatinine Trend: No results for input(s): "CREATININE" in the last 72 hours.   Relevant and/or Recent Imaging: None    Assessment & Plan Barbara [name] is a 62 y.o. female with a history of bladder malignancy seen today for port placement.  She is an appropriate candidate for port placement.  Risks and benefits discussed, and patient is agreeable to proceed.    Procedure Checklist:  Consent obtained: Risks of the procedure as well as the alternatives and risks of each were explained to the patient and/or caregiver.  Consent for the procedure was obtained and is signed in the bedside chart Consent obtained from: The patient Patient is appropriate candidate for sedation Yes ASA Classification: ASA 2 - Patient with mild systemic disease with no functional limitations NPO status: 0000  Code status:   Code Status: Not on file Pre-procedural  prep necessary: none      Olive Bass, MD  Vascular and Interventional Radiology 05/03/2023 1:43 PM

## 2023-05-03 NOTE — Procedures (Signed)
Interventional Radiology Procedure Note  Date of Procedure: 05/03/2023  Procedure: Port placement   Findings:  1. Right chest port placement    Complications: No immediate complications noted.   Estimated Blood Loss: minimal  Follow-up and Recommendations: 1. Bedrest 1 hour 2. Ready for use    Olive Bass, MD  Vascular & Interventional Radiology  05/03/2023 2:52 PM

## 2023-05-03 NOTE — Progress Notes (Signed)
Patient clinically stable post Port placement per Dr Juliette Alcide, tolerated well. Vitals stable pre and post procedure. Given Versed 2 mg along with Fentanyl 100 mcg Iv for procedure. Report given to Talbert Forest RN post procedure/specials/14.

## 2023-05-06 ENCOUNTER — Other Ambulatory Visit: Payer: Self-pay | Admitting: Physician Assistant

## 2023-05-06 MED FILL — Dexamethasone Sodium Phosphate Inj 100 MG/10ML: INTRAMUSCULAR | Qty: 1 | Status: AC

## 2023-05-06 MED FILL — Fosaprepitant Dimeglumine For IV Infusion 150 MG (Base Eq): INTRAVENOUS | Qty: 5 | Status: AC

## 2023-05-07 ENCOUNTER — Inpatient Hospital Stay: Payer: Managed Care, Other (non HMO)

## 2023-05-07 ENCOUNTER — Inpatient Hospital Stay (HOSPITAL_BASED_OUTPATIENT_CLINIC_OR_DEPARTMENT_OTHER): Payer: Managed Care, Other (non HMO) | Admitting: Internal Medicine

## 2023-05-07 ENCOUNTER — Inpatient Hospital Stay: Payer: Managed Care, Other (non HMO) | Attending: Internal Medicine

## 2023-05-07 ENCOUNTER — Encounter: Payer: Self-pay | Admitting: Internal Medicine

## 2023-05-07 VITALS — BP 113/70 | HR 67 | Temp 97.6°F | Resp 18 | Wt 121.2 lb

## 2023-05-07 DIAGNOSIS — Z5189 Encounter for other specified aftercare: Secondary | ICD-10-CM | POA: Diagnosis not present

## 2023-05-07 DIAGNOSIS — Z79899 Other long term (current) drug therapy: Secondary | ICD-10-CM | POA: Insufficient documentation

## 2023-05-07 DIAGNOSIS — E876 Hypokalemia: Secondary | ICD-10-CM | POA: Insufficient documentation

## 2023-05-07 DIAGNOSIS — C679 Malignant neoplasm of bladder, unspecified: Secondary | ICD-10-CM

## 2023-05-07 DIAGNOSIS — Z87891 Personal history of nicotine dependence: Secondary | ICD-10-CM | POA: Insufficient documentation

## 2023-05-07 DIAGNOSIS — Z5111 Encounter for antineoplastic chemotherapy: Secondary | ICD-10-CM

## 2023-05-07 LAB — CMP (CANCER CENTER ONLY)
ALT: 9 U/L (ref 0–44)
AST: 15 U/L (ref 15–41)
Albumin: 3.9 g/dL (ref 3.5–5.0)
Alkaline Phosphatase: 37 U/L — ABNORMAL LOW (ref 38–126)
Anion gap: 7 (ref 5–15)
BUN: 26 mg/dL — ABNORMAL HIGH (ref 8–23)
CO2: 21 mmol/L — ABNORMAL LOW (ref 22–32)
Calcium: 8.4 mg/dL — ABNORMAL LOW (ref 8.9–10.3)
Chloride: 107 mmol/L (ref 98–111)
Creatinine: 1.15 mg/dL — ABNORMAL HIGH (ref 0.44–1.00)
GFR, Estimated: 54 mL/min — ABNORMAL LOW (ref >60)
Glucose, Bld: 102 mg/dL — ABNORMAL HIGH (ref 70–99)
Potassium: 3.3 mmol/L — ABNORMAL LOW (ref 3.5–5.1)
Sodium: 135 mmol/L (ref 135–145)
Total Bilirubin: 0.5 mg/dL (ref 0.3–1.2)
Total Protein: 7.2 g/dL (ref 6.5–8.1)

## 2023-05-07 LAB — LIPID PANEL WITH LDL/HDL RATIO
Cholesterol, Total: 216 mg/dL — ABNORMAL HIGH (ref 100–199)
HDL: 60 mg/dL (ref >39)
LDL Chol Calc (NIH): 138 mg/dL — ABNORMAL HIGH (ref 0–99)
LDL/HDL Ratio: 2.3 ratio (ref 0.0–3.2)
Triglycerides: 99 mg/dL (ref 0–149)
VLDL Cholesterol Cal: 18 mg/dL (ref 5–40)

## 2023-05-07 LAB — CBC WITH DIFFERENTIAL (CANCER CENTER ONLY)
Abs Immature Granulocytes: 0.02 10*3/uL (ref 0.00–0.07)
Basophils Absolute: 0.1 10*3/uL (ref 0.0–0.1)
Basophils Relative: 1 %
Eosinophils Absolute: 0.4 10*3/uL (ref 0.0–0.5)
Eosinophils Relative: 7 %
HCT: 35.3 % — ABNORMAL LOW (ref 36.0–46.0)
Hemoglobin: 12 g/dL (ref 12.0–15.0)
Immature Granulocytes: 0 %
Lymphocytes Relative: 33 %
Lymphs Abs: 2 10*3/uL (ref 0.7–4.0)
MCH: 31.8 pg (ref 26.0–34.0)
MCHC: 34 g/dL (ref 30.0–36.0)
MCV: 93.6 fL (ref 80.0–100.0)
Monocytes Absolute: 0.6 10*3/uL (ref 0.1–1.0)
Monocytes Relative: 9 %
Neutro Abs: 3 10*3/uL (ref 1.7–7.7)
Neutrophils Relative %: 50 %
Platelet Count: 238 10*3/uL (ref 150–400)
RBC: 3.77 MIL/uL — ABNORMAL LOW (ref 3.87–5.11)
RDW: 12.2 % (ref 11.5–15.5)
WBC Count: 6 10*3/uL (ref 4.0–10.5)
nRBC: 0 % (ref 0.0–0.2)

## 2023-05-07 LAB — HGB A1C W/O EAG: Hgb A1c MFr Bld: 5.2 % (ref 4.8–5.6)

## 2023-05-07 LAB — MAGNESIUM: Magnesium: 2 mg/dL (ref 1.7–2.4)

## 2023-05-07 MED ORDER — SODIUM CHLORIDE 0.9 % IV SOLN
Freq: Once | INTRAVENOUS | Status: AC
  Filled 2023-05-07: qty 250

## 2023-05-07 MED ORDER — SODIUM CHLORIDE 0.9 % IV SOLN
35.0000 mg/m2 | Freq: Once | INTRAVENOUS | Status: AC
  Administered 2023-05-07: 56 mg via INTRAVENOUS
  Filled 2023-05-07: qty 56

## 2023-05-07 MED ORDER — ONDANSETRON HCL 8 MG PO TABS: 8.0000 mg | ORAL_TABLET | Freq: Three times a day (TID) | ORAL | 1 refills | Status: DC | PRN

## 2023-05-07 MED ORDER — SODIUM CHLORIDE 0.9 % IV SOLN
10.0000 mg | Freq: Once | INTRAVENOUS | Status: AC
  Administered 2023-05-07: 10 mg via INTRAVENOUS
  Filled 2023-05-07: qty 10

## 2023-05-07 MED ORDER — HEPARIN SOD (PORK) LOCK FLUSH 100 UNIT/ML IV SOLN
500.0000 [IU] | Freq: Once | INTRAVENOUS | Status: DC | PRN
  Filled 2023-05-07: qty 5

## 2023-05-07 MED ORDER — LIDOCAINE-PRILOCAINE 2.5-2.5 % EX CREA: 1.0000 | TOPICAL_CREAM | CUTANEOUS | 0 refills | Status: DC | PRN

## 2023-05-07 MED ORDER — MAGNESIUM SULFATE 2 GM/50ML IV SOLN
2.0000 g | Freq: Once | INTRAVENOUS | Status: AC
  Administered 2023-05-07: 2 g via INTRAVENOUS
  Filled 2023-05-07: qty 50

## 2023-05-07 MED ORDER — POTASSIUM CHLORIDE CRYS ER 20 MEQ PO TBCR: 20.0000 meq | EXTENDED_RELEASE_TABLET | Freq: Every day | ORAL | 0 refills | Status: DC

## 2023-05-07 MED ORDER — PROCHLORPERAZINE MALEATE 10 MG PO TABS: 10.0000 mg | ORAL_TABLET | Freq: Four times a day (QID) | ORAL | 0 refills | Status: DC | PRN

## 2023-05-07 MED ORDER — SODIUM CHLORIDE 0.9 % IV SOLN
150.0000 mg | Freq: Once | INTRAVENOUS | Status: AC
  Administered 2023-05-07: 150 mg via INTRAVENOUS
  Filled 2023-05-07: qty 150

## 2023-05-07 MED ORDER — PALONOSETRON HCL INJECTION 0.25 MG/5ML
0.2500 mg | Freq: Once | INTRAVENOUS | Status: AC
  Administered 2023-05-07: 0.25 mg via INTRAVENOUS
  Filled 2023-05-07: qty 5

## 2023-05-07 MED ORDER — SODIUM CHLORIDE 0.9 % IV SOLN
1000.0000 mg/m2 | Freq: Once | INTRAVENOUS | Status: AC
  Administered 2023-05-07: 1596 mg via INTRAVENOUS
  Filled 2023-05-07: qty 41.98

## 2023-05-07 MED ORDER — DEXAMETHASONE 4 MG PO TABS: 8.0000 mg | ORAL_TABLET | Freq: Every day | ORAL | 0 refills | Status: AC

## 2023-05-07 MED ORDER — POTASSIUM CHLORIDE IN NACL 20-0.9 MEQ/L-% IV SOLN
Freq: Once | INTRAVENOUS | Status: AC
  Filled 2023-05-07: qty 1000

## 2023-05-07 NOTE — Patient Instructions (Signed)

## 2023-05-07 NOTE — Patient Instructions (Signed)
[facility]  Discharge Instructions: Thank you for choosing [facility] to provide your oncology and hematology care.  If you have a lab appointment with the Cancer Center, please go directly to the Cancer Center and check in at the registration area.  Wear comfortable clothing and clothing appropriate for easy access to any Portacath or PICC line.   We strive to give you quality time with your provider. You may need to reschedule your appointment if you arrive late (15 or more minutes).  Arriving late affects you and other patients whose appointments are after yours.  Also, if you miss three or more appointments without notifying the office, you may be dismissed from the clinic at the provider's discretion.      For prescription refill requests, have your pharmacy contact our office and allow 72 hours for refills to be completed.    Today you received the following chemotherapy and/or immunotherapy agents Cisplatin & Gemzar      To help prevent nausea and vomiting after your treatment, we encourage you to take your nausea medication as directed.  BELOW ARE SYMPTOMS THAT SHOULD BE REPORTED IMMEDIATELY: *FEVER GREATER THAN 100.4 F (38 C) OR HIGHER *CHILLS OR SWEATING *NAUSEA AND VOMITING THAT IS NOT CONTROLLED WITH YOUR NAUSEA MEDICATION *UNUSUAL SHORTNESS OF BREATH *UNUSUAL BRUISING OR BLEEDING *URINARY PROBLEMS (pain or burning when urinating, or frequent urination) *BOWEL PROBLEMS (unusual diarrhea, constipation, pain near the anus) TENDERNESS IN MOUTH AND THROAT WITH OR WITHOUT PRESENCE OF ULCERS (sore throat, sores in mouth, or a toothache) UNUSUAL RASH, SWELLING OR PAIN  UNUSUAL VAGINAL DISCHARGE OR ITCHING   Items with * indicate a potential emergency and should be followed up as soon as possible or go to the Emergency Department if any problems should occur.  Please show the CHEMOTHERAPY ALERT CARD or IMMUNOTHERAPY ALERT CARD at  check-in to the Emergency Department and triage nurse.  Should you have questions after your visit or need to cancel or reschedule your appointment, please contact [facility]  [phone] and follow the prompts.  Office hours are 8:00 a.m. to 4:30 p.m. Monday - Friday. Please note that voicemails left after 4:00 p.m. may not be returned until the following business day.  We are closed weekends and major holidays. You have access to a nurse at all times for urgent questions. Please call the main number to the clinic [phone] and follow the prompts.  For any non-urgent questions, you may also contact your provider using MyChart. We now offer e-Visits for anyone 52 and older to request care online for non-urgent symptoms. For details visit [email or web address].   Also download the MyChart app! Go to the app store, search "MyChart", open the app, select [facility], and log in with your MyChart username and password.

## 2023-05-07 NOTE — Progress Notes (Signed)
[facility] CONSULT NOTE  Patient Care Team: McDonough, Renard Matter as PCP - General (Physician Assistant)  REFERRING PROVIDER: Dr. Apolinar Junes  REASON FOR REFFERAL: Muscle invasive bladder cancer  CANCER STAGING   Cancer Staging  Urothelial cancer [facility]) Staging form: Kidney, AJCC 8th Edition - Clinical: Stage III (cT3, cN0, cM0) - Signed by Michaelyn Barter, MD on 05/07/2023 Stage prefix: Initial diagnosis   ASSESSMENT & PLAN:  Barbara [name] 62 y.o. female with pmh of anxiety, GERD, right carotid artery stenosis was referred to medical oncology for new diagnosis of muscle invasive urothelial cancer.  # Muscle invasive urothelial carcinoma, cT3N0M0 # Moderate left hydroureteronephrosis s/p left ureteral stent - Patient developed urinary symptoms around September or October 2023.  Was treated with multiple rounds of antibiotics with no improvement.  She was then referred to Dr. Apolinar Junes for further evaluation.  - CTU from 03/22/2023 showed moderate left hydroureteronephrosis to the UVJ, marked leftward asymmetric wall thickening of the urinary bladder measuring 18 mm in maximal thickness which extends over the UVJ.  Perivesical stranding soft tissue.  No evidence of upper tract or abdominopelvic metastatic disease.  - s/p TURBT with left ureteral stent placement by Dr. Apolinar Junes.  IntraOp findings showed large irregular mucosal submucosal extensive tumor from the left dome, left lateral bladder wall majority of trigone including the left UO and bladder neck.  Measures at least 5 x 5 cm. Pathology showed invasive high-grade urothelial carcinoma.  There is some discrepancy.  Lower down in the report mentions low-grade.  Invades muscularis propria.  -CT chest without contrast did not show any evidence of metastatic disease.  Showed mild COPD changes, there is aortic atherosclerosis as well as atherosclerosis of great vessels of mediastinum and coronary arteries including calcified  atherosclerotic plaque in the left main, left anterior descending and left circumflex coronary artery.  Calcifications of the aortic valve.  -Labs reviewed.  CBC is unremarkable.  CMP showed creatinine 1.15, potassium 3.3.  Creatinine clearance 45.  GFR improved to 54 from 48.  Will cautiously proceed with split dose cisplatin 35 mg/m and gemcitabine 1000 mg/m day 1 and day 8 every 3 weeks for total 4 cycles.  Will closely monitor her kidney numbers with weekly labs.  We discussed about Decadron 8 mg for 3 days after cisplatin.  Discussed about antiemetics with Zofran and Compazine.  # Coronary atherosclerosis -Detected as incidental finding on CT done for staging purposes.  Discussed about management of risk factors such as cholesterol, high blood pressure and glucose.  Her last lipid panel showed LDL of 138.  Advised to discuss with primary about role of statin.  Rest per primary.  # Mild COPD changes -On CT's chest.  Prior history of smoking.  Asymptomatic.  # Access-port placed on 05/03/2023  No orders of the defined types were placed in this encounter.   The total time spent in the appointment was 55 minutes encounter with patients including review of chart and various tests results, discussions about plan of care and coordination of care plan   All questions were answered. The patient knows to call the clinic with any problems, questions or concerns. No barriers to learning was detected.  Michaelyn Barter, MD 7/16/20249:59 AM   HISTORY OF PRESENTING ILLNESS:  Barbara [name] 62 y.o. female with pmh of pmh of anxiety, GERD, right carotid artery stenosis was referred to medical oncology for new diagnosis of muscle invasive urothelial cancer.  Patient develops urinary symptoms around September or  October 2023.  Was treated with multiple rounds of antibiotics with no improvement.  She was then referred to Dr. Apolinar Junes for further evaluation.  CTU from 03/22/2023 showed moderate left  hydroureteronephrosis to the UVJ, marked leftward asymmetric wall thickening of the urinary bladder measuring 18 mm in maximal thickness which extends over the UVJ.  Perivesical stranding soft tissue.  No evidence of upper tract or abdominopelvic metastatic disease.  s/p TURBT with left ureteral stent placement by Dr. Apolinar Junes.  IntraOp findings showed large irregular mucosal submucosal extensive tumor from the left dome, left lateral bladder wall majority of trigone including the left UO and bladder neck.  Measures at least 5 x 5 cm.  Pathology showed invasive high-grade urothelial carcinoma.  There is some discrepancy.  Lower down in the report mentions low-grade.  Invades muscularis propria.  Interval history Patient was seen today as follow-up prior to starting cycle 1 of cisplatin and gemcitabine. She has been feeling well overall.  Had port placement which went well.  Denies any complaints.  I have reviewed her chart and materials related to her cancer extensively and collaborated history with the patient. Summary of oncologic history is as follows: Oncology History  Urothelial cancer (HCC)  04/12/2023 Initial Diagnosis   Urothelial cancer (HCC)   04/12/2023 Cancer Staging   Staging form: Kidney, AJCC 8th Edition - Clinical: Stage III (cT3, cN0, cM0) - Signed by Michaelyn Barter, MD on 05/07/2023 Stage prefix: Initial diagnosis   Malignant neoplasm of urinary bladder (HCC)  04/22/2023 Initial Diagnosis   Malignant neoplasm of urinary bladder (HCC)   05/07/2023 -  Chemotherapy   Patient is on Treatment Plan : BLADDER Gemcitabine D1,8 + Cisplatin (split dose) D1,8 q21d x 4 cycles       MEDICAL HISTORY:  Past Medical History:  Diagnosis Date   Adopted    Allergic rhinitis    Anemia    h/o   Anxiety    Arthritis    B12 deficiency    Bilateral carotid artery stenosis    Bilateral carotid bruits    Bladder tumor    Depression    Gallbladder polyp    GERD (gastroesophageal reflux  disease)    Hydronephrosis of left kidney    Migraines    Tobacco abuse    UTI (urinary tract infection)     SURGICAL HISTORY: Past Surgical History:  Procedure Laterality Date   ABDOMINAL HYSTERECTOMY     BLADDER SURGERY     BREAST BIOPSY Left 2014   neg   CYSTOSCOPY W/ URETERAL STENT PLACEMENT Left 04/04/2023   Procedure: CYSTOSCOPY WITH RETROGRADE PYELOGRAM/URETERAL STENT PLACEMENT;  Surgeon: Vanna Scotland, MD;  Location: [facility] ORS;  Service: Urology;  Laterality: Left;   FOOT SURGERY Right    2009   IR IMAGING GUIDED PORT INSERTION  05/03/2023   LAPAROSCOPIC APPENDECTOMY  12/05/2012   NASAL SEPTUM SURGERY     SISTRUNK PROCEDURE  03/03/2012   Thyroglossal duct cyst excision   TONSILLECTOMY     TRANSURETHRAL RESECTION OF BLADDER TUMOR N/A 04/04/2023   Procedure: TRANSURETHRAL RESECTION OF BLADDER TUMOR (TURBT);  Surgeon: Vanna Scotland, MD;  Location: [facility] ORS;  Service: Urology;  Laterality: N/A;    SOCIAL HISTORY: Social History   Socioeconomic History   Marital status: Married    Spouse name: Not on file   Number of children: Not on file   Years of education: Not on file   Highest education level: Not on file  Occupational History   Not on  file  Tobacco Use   Smoking status: Former    Current packs/day: 0.25    Average packs/day: 0.3 packs/day for 30.0 years (7.5 ttl pk-yrs)    Types: Cigarettes    Passive exposure: Past   Smokeless tobacco: Never  Vaping Use   Vaping status: Never Used  Substance and Sexual Activity   Alcohol use: Never   Drug use: Never   Sexual activity: Not on file  Other Topics Concern   Not on file  Social History Narrative   Not on file   Social Determinants of Health   Financial Resource Strain: Not on file  Food Insecurity: No Food Insecurity (04/12/2023)   Hunger Vital Sign    Worried About Running Out of Food in the Last Year: Never true    Ran Out of Food in the Last Year: Never true  Transportation Needs: No  Transportation Needs (04/12/2023)   PRAPARE - Administrator, Civil Service (Medical): No    Lack of Transportation (Non-Medical): No  Physical Activity: Not on file  Stress: Not on file  Social Connections: Not on file  Intimate Partner Violence: Not At Risk (04/12/2023)   Humiliation, Afraid, Rape, and Kick questionnaire    Fear of Current or Ex-Partner: No    Emotionally Abused: No    Physically Abused: No    Sexually Abused: No    FAMILY HISTORY: Family History  Adopted: Yes    ALLERGIES:  is allergic to pseudoephedrine hcl.  MEDICATIONS:  Current Outpatient Medications  Medication Sig Dispense Refill   dexamethasone (DECADRON) 4 MG tablet Take 2 tablets (8 mg total) by mouth daily. For 3 days starting the day after cisplatin chemotherapy.  Take with food. 60 tablet 0   estradiol (ESTRACE) 1 MG tablet TAKE 1 TABLET BY MOUTH DAILY 90 tablet 3   HYDROcodone-acetaminophen (NORCO/VICODIN) 5-325 MG tablet Take 1 tablet by mouth every 6 (six) hours as needed for moderate pain. 20 tablet 0   lidocaine-prilocaine (EMLA) cream Apply 1 Application topically as needed. 30 g 0   ondansetron (ZOFRAN) 8 MG tablet Take 1 tablet (8 mg total) by mouth every 8 (eight) hours as needed for up to 30 doses for nausea or vomiting. 30 tablet 1   oxybutynin (DITROPAN) 5 MG tablet Take 1 tablet (5 mg total) by mouth every 8 (eight) hours as needed for bladder spasms. 30 tablet 0   potassium chloride SA (KLOR-CON M) 20 MEQ tablet Take 1 tablet (20 mEq total) by mouth daily for 2 days. 2 tablet 0   prochlorperazine (COMPAZINE) 10 MG tablet Take 1 tablet (10 mg total) by mouth every 6 (six) hours as needed for nausea or vomiting. 30 tablet 0   topiramate (TOPAMAX) 50 MG tablet Take 2 tablets (100 mg total) by mouth at bedtime. 180 tablet 3   trimethoprim (TRIMPEX) 100 MG tablet Take 1 tablet (100 mg total) by mouth daily. 30 tablet 11   celecoxib (CELEBREX) 200 MG capsule TAKE 1 CAPSULE BY MOUTH  DAILY (Patient not taking: Reported on 05/07/2023) 90 capsule 3   ibuprofen (ADVIL) 200 MG tablet Take 800 mg by mouth 2 (two) times daily. (Patient not taking: Reported on 05/07/2023)     No current facility-administered medications for this visit.   Facility-Administered Medications Ordered in Other Visits  Medication Dose Route Frequency Provider Last Rate Last Admin   0.9 % NaCl with KCl 20 mEq/ L  infusion   Intravenous Once Michaelyn Barter, MD  CISplatin (PLATINOL) 56 mg in sodium chloride 0.9 % 250 mL chemo infusion  35 mg/m2 (Treatment Plan Recorded) Intravenous Once Michaelyn Barter, MD       dexamethasone (DECADRON) 10 mg in sodium chloride 0.9 % 50 mL IVPB  10 mg Intravenous Once Michaelyn Barter, MD       fosaprepitant (EMEND) 150 mg in sodium chloride 0.9 % 145 mL IVPB  150 mg Intravenous Once Michaelyn Barter, MD       gemcitabine (GEMZAR) 1,596 mg in sodium chloride 0.9 % 250 mL chemo infusion  1,000 mg/m2 (Treatment Plan Recorded) Intravenous Once Michaelyn Barter, MD       heparin lock flush 100 unit/mL  500 Units Intracatheter Once PRN Michaelyn Barter, MD       magnesium sulfate IVPB 2 g 50 mL  2 g Intravenous Once Michaelyn Barter, MD       palonosetron (ALOXI) injection 0.25 mg  0.25 mg Intravenous Once Michaelyn Barter, MD        REVIEW OF SYSTEMS:   Pertinent information mentioned in HPI All other systems were reviewed with the patient and are negative.  PHYSICAL EXAMINATION: ECOG PERFORMANCE STATUS: 1 - Symptomatic but completely ambulatory  Vitals:   05/07/23 0900  BP: 113/70  Pulse: 67  Resp: 18  Temp: 97.6 F (36.4 C)  SpO2: 99%   Filed Weights   05/07/23 0900  Weight: 121 lb 3.2 oz (55 kg)    GENERAL:alert, no distress and comfortable SKIN: skin color, texture, turgor are normal, no rashes or significant lesions EYES: normal, conjunctiva are pink and non-injected, sclera clear OROPHARYNX:no exudate, no erythema and lips, buccal mucosa, and tongue  normal  NECK: supple, thyroid normal size, non-tender, without nodularity LYMPH:  no palpable lymphadenopathy in the cervical, axillary or inguinal LUNGS: clear to auscultation and percussion with normal breathing effort HEART: regular rate & rhythm and no murmurs and no lower extremity edema ABDOMEN:abdomen soft, non-tender and normal bowel sounds Musculoskeletal:no cyanosis of digits and no clubbing  PSYCH: alert & oriented x 3 with fluent speech NEURO: no focal motor/sensory deficits  LABORATORY DATA:  I have reviewed the data as listed Lab Results  Component Value Date   WBC 6.0 05/07/2023   HGB 12.0 05/07/2023   HCT 35.3 (L) 05/07/2023   MCV 93.6 05/07/2023   PLT 238 05/07/2023   Recent Labs    10/04/22 1307 03/22/23 1742 04/12/23 1331 04/19/23 1056 05/07/23 0846  NA 140  --  138 137 135  K 4.4  --  4.2 3.7 3.3*  CL 105  --  108 105 107  CO2 21  --  22 24 21*  GLUCOSE 81  --  81 92 102*  BUN 22  --  26* 16 26*  CREATININE 1.26*   < > 1.18* 1.24* 1.15*  CALCIUM 9.3  --  8.7* 9.0 8.4*  GFRNONAA  --   --  52* 49* 54*  PROT 6.9  --  7.0  --  7.2  ALBUMIN 4.5  --  3.9  --  3.9  AST 13  --  14*  --  15  ALT 7  --  9  --  9  ALKPHOS 39*  --  33*  --  37*  BILITOT 0.4  --  0.7  --  0.5   < > = values in this interval not displayed.    RADIOGRAPHIC STUDIES: I have personally reviewed the radiological images as listed and agreed with the findings in  the report. IR IMAGING GUIDED PORT INSERTION  Result Date: 05/03/2023 INDICATION: Bladder malignancy, Chemotherapy EXAM: Chest port placement using ultrasound and fluoroscopic guidance MEDICATIONS: Documented in the EMR ANESTHESIA/SEDATION: Moderate (conscious) sedation was employed during this procedure. A total of Versed 2 mg and Fentanyl 100 mcg was administered intravenously. Moderate Sedation Time: 25 minutes. The patient's level of consciousness and vital signs were monitored continuously by radiology nursing throughout  the procedure under my direct supervision. FLUOROSCOPY TIME:  Fluoroscopy Time: 0.3 minutes (< 1 mGy) COMPLICATIONS: None immediate. PROCEDURE: Informed written consent was obtained from the patient after a thorough discussion of the procedural risks, benefits and alternatives. All questions were addressed. Maximal Sterile Barrier Technique was utilized including caps, mask, sterile gowns, sterile gloves, sterile drape, hand hygiene and skin antiseptic. A timeout was performed prior to the initiation of the procedure. The patient was placed supine on the exam table. The right neck and chest was prepped and draped in the standard sterile fashion. A preliminary ultrasound of the right neck was performed and demonstrates a patent right internal jugular vein. A permanent ultrasound image was stored in the electronic medical record. The overlying skin was anesthetized with 1% Lidocaine. Using ultrasound guidance, access was obtained into the right internal jugular vein using a 21 gauge micropuncture set. A wire was advanced into the SVC, a short incision was made at the puncture site, and serial dilatation performed. Next, in an ipsilateral infraclavicular location, an incision was made at the site of the subcutaneous reservoir. Blunt dissection was used to open a pocket to contain the reservoir. A subcutaneous tunnel was then created from the port site to the puncture site. A(n) 8 Fr single lumen catheter was advanced through the tunnel. The catheter was attached to the port and this was placed in the subcutaneous pocket. Under fluoroscopic guidance, a peel away sheath was placed, and the catheter was trimmed to the appropriate length and was advanced into the central veins. The catheter length is 24 cm. The tip of the catheter lies near the superior cavoatrial junction. The port flushes and aspirates appropriately. The port was flushed and locked with heparinized saline. The port pocket was closed in 2 layers using 3-0  and 4-0 Vicryl/absorbable suture. Dermabond was also applied to both incisions. The patient tolerated the procedure well and was transferred to recovery in stable condition. IMPRESSION: Successful placement of a right-sided chest port via the right internal jugular vein. The port is ready for immediate use. Electronically Signed   By: Olive Bass M.D.   On: 05/03/2023 14:57   CT CHEST WO CONTRAST  Result Date: 04/25/2023 CLINICAL DATA:  62 year old female with history of bladder cancer. Evaluate for metastatic disease. * Tracking Code: BO * EXAM: CT CHEST WITHOUT CONTRAST TECHNIQUE: Multidetector CT imaging of the chest was performed following the standard protocol without IV contrast. RADIATION DOSE REDUCTION: This exam was performed according to the departmental dose-optimization program which includes automated exposure control, adjustment of the mA and/or kV according to patient size and/or use of iterative reconstruction technique. COMPARISON:  No prior chest CT. CT the abdomen and pelvis 03/22/2023. FINDINGS: Cardiovascular: Heart size is normal. There is no significant pericardial fluid, thickening or pericardial calcification. There is aortic atherosclerosis, as well as atherosclerosis of the great vessels of the mediastinum and the coronary arteries, including calcified atherosclerotic plaque in the left main, left anterior descending and left circumflex coronary arteries. Calcifications of the aortic valve. Mediastinum/Nodes: Mediastinal or hilar no pathologically enlarged lymph  nodes. Please note that accurate exclusion of hilar adenopathy is limited on noncontrast CT scans. Esophagus is unremarkable in appearance. No axillary lymphadenopathy. Lungs/Pleura: No suspicious appearing pulmonary nodules or masses are noted. No acute consolidative airspace disease. No pleural effusions. Mild diffuse bronchial wall thickening with very mild centrilobular and paraseptal emphysema. Upper Abdomen: Aortic  atherosclerosis. There is what appears to be a left ureteral stent partially imaged in the region of the left renal pelvis. Musculoskeletal: There are no aggressive appearing lytic or blastic lesions noted in the visualized portions of the skeleton. IMPRESSION: 1. No findings to suggest metastatic disease to the thorax. 2. Mild diffuse bronchial wall thickening with very mild centrilobular and paraseptal emphysema; imaging findings suggestive of underlying COPD. 3. Aortic atherosclerosis, in addition to left main and 2 vessel coronary artery disease. Please note that although the presence of coronary artery calcium documents the presence of coronary artery disease, the severity of this disease and any potential stenosis cannot be assessed on this non-gated CT examination. Assessment for potential risk factor modification, dietary therapy or pharmacologic therapy may be warranted, if clinically indicated. 4. There are severe calcifications of the aortic valve. Echocardiographic correlation for evaluation of potential valvular dysfunction may be warranted if clinically indicated. Aortic Atherosclerosis (ICD10-I70.0) and Emphysema (ICD10-J43.9). Electronically Signed   By: Trudie Reed M.D.   On: 04/25/2023 11:02

## 2023-05-08 ENCOUNTER — Telehealth: Payer: Self-pay

## 2023-05-08 ENCOUNTER — Other Ambulatory Visit: Payer: Self-pay

## 2023-05-08 NOTE — Telephone Encounter (Signed)
 Telephone call to patient for follow up after receiving first infusion.   Patient states infusion went great.  States eating good and drinking plenty of fluids.   Denies any nausea or vomiting.  Encouraged patient to call for any concerns or questions. 

## 2023-05-09 ENCOUNTER — Telehealth: Payer: Self-pay | Admitting: Physician Assistant

## 2023-05-09 NOTE — Telephone Encounter (Signed)
Health Screening faxed back to [facility];[phone]. Notified patient. She will p/u at front desk. Scanned-Toni

## 2023-05-13 ENCOUNTER — Encounter: Payer: Self-pay | Admitting: Internal Medicine

## 2023-05-13 MED FILL — Dexamethasone Sodium Phosphate Inj 100 MG/10ML: INTRAMUSCULAR | Qty: 1 | Status: AC

## 2023-05-13 MED FILL — Fosaprepitant Dimeglumine For IV Infusion 150 MG (Base Eq): INTRAVENOUS | Qty: 5 | Status: AC

## 2023-05-14 ENCOUNTER — Inpatient Hospital Stay (HOSPITAL_BASED_OUTPATIENT_CLINIC_OR_DEPARTMENT_OTHER): Payer: Managed Care, Other (non HMO) | Admitting: Internal Medicine

## 2023-05-14 ENCOUNTER — Inpatient Hospital Stay: Payer: Managed Care, Other (non HMO)

## 2023-05-14 VITALS — BP 110/73 | HR 76 | Temp 97.6°F | Wt 121.0 lb

## 2023-05-14 VITALS — BP 152/73 | HR 82

## 2023-05-14 DIAGNOSIS — C679 Malignant neoplasm of bladder, unspecified: Secondary | ICD-10-CM

## 2023-05-14 DIAGNOSIS — Z5111 Encounter for antineoplastic chemotherapy: Secondary | ICD-10-CM | POA: Diagnosis not present

## 2023-05-14 LAB — CMP (CANCER CENTER ONLY)
ALT: 18 U/L (ref 0–44)
AST: 21 U/L (ref 15–41)
Albumin: 3.5 g/dL (ref 3.5–5.0)
Alkaline Phosphatase: 46 U/L (ref 38–126)
Anion gap: 7 (ref 5–15)
BUN: 27 mg/dL — ABNORMAL HIGH (ref 8–23)
CO2: 24 mmol/L (ref 22–32)
Calcium: 8.2 mg/dL — ABNORMAL LOW (ref 8.9–10.3)
Chloride: 105 mmol/L (ref 98–111)
Creatinine: 1.05 mg/dL — ABNORMAL HIGH (ref 0.44–1.00)
GFR, Estimated: >60 mL/min (ref >60)
Glucose, Bld: 153 mg/dL — ABNORMAL HIGH (ref 70–99)
Potassium: 3.5 mmol/L (ref 3.5–5.1)
Sodium: 136 mmol/L (ref 135–145)
Total Bilirubin: 0.9 mg/dL (ref 0.3–1.2)
Total Protein: 6.5 g/dL (ref 6.5–8.1)

## 2023-05-14 LAB — CBC WITH DIFFERENTIAL (CANCER CENTER ONLY)
Abs Immature Granulocytes: 0.02 10*3/uL (ref 0.00–0.07)
Basophils Absolute: 0 10*3/uL (ref 0.0–0.1)
Basophils Relative: 0 %
Eosinophils Absolute: 0.2 10*3/uL (ref 0.0–0.5)
Eosinophils Relative: 4 %
HCT: 34.7 % — ABNORMAL LOW (ref 36.0–46.0)
Hemoglobin: 11.7 g/dL — ABNORMAL LOW (ref 12.0–15.0)
Immature Granulocytes: 0 %
Lymphocytes Relative: 48 %
Lymphs Abs: 2.4 10*3/uL (ref 0.7–4.0)
MCH: 31.7 pg (ref 26.0–34.0)
MCHC: 33.7 g/dL (ref 30.0–36.0)
MCV: 94 fL (ref 80.0–100.0)
Monocytes Absolute: 0.1 10*3/uL (ref 0.1–1.0)
Monocytes Relative: 2 %
Neutro Abs: 2.3 10*3/uL (ref 1.7–7.7)
Neutrophils Relative %: 46 %
Platelet Count: 166 10*3/uL (ref 150–400)
RBC: 3.69 MIL/uL — ABNORMAL LOW (ref 3.87–5.11)
RDW: 11.9 % (ref 11.5–15.5)
WBC Count: 5.1 10*3/uL (ref 4.0–10.5)
nRBC: 0 % (ref 0.0–0.2)

## 2023-05-14 LAB — MAGNESIUM: Magnesium: 1.8 mg/dL (ref 1.7–2.4)

## 2023-05-14 MED ORDER — SODIUM CHLORIDE 0.9 % IV SOLN
10.0000 mg | Freq: Once | INTRAVENOUS | Status: AC
  Administered 2023-05-14: 10 mg via INTRAVENOUS
  Filled 2023-05-14: qty 10

## 2023-05-14 MED ORDER — SODIUM CHLORIDE 0.9 % IV SOLN
25.0000 mg/m2 | Freq: Once | INTRAVENOUS | Status: AC
  Administered 2023-05-14: 40 mg via INTRAVENOUS
  Filled 2023-05-14: qty 40

## 2023-05-14 MED ORDER — SODIUM CHLORIDE 0.9 % IV SOLN
150.0000 mg | Freq: Once | INTRAVENOUS | Status: AC
  Administered 2023-05-14: 150 mg via INTRAVENOUS
  Filled 2023-05-14: qty 150

## 2023-05-14 MED ORDER — SODIUM CHLORIDE 0.9 % IV SOLN
1000.0000 mg/m2 | Freq: Once | INTRAVENOUS | Status: AC
  Administered 2023-05-14: 1596 mg via INTRAVENOUS
  Filled 2023-05-14: qty 41.98

## 2023-05-14 MED ORDER — PALONOSETRON HCL INJECTION 0.25 MG/5ML
0.2500 mg | Freq: Once | INTRAVENOUS | Status: AC
  Administered 2023-05-14: 0.25 mg via INTRAVENOUS
  Filled 2023-05-14: qty 5

## 2023-05-14 MED ORDER — HEPARIN SOD (PORK) LOCK FLUSH 100 UNIT/ML IV SOLN
500.0000 [IU] | Freq: Once | INTRAVENOUS | Status: AC | PRN
  Administered 2023-05-14: 500 [IU]
  Filled 2023-05-14: qty 5

## 2023-05-14 MED ORDER — MAGNESIUM SULFATE 2 GM/50ML IV SOLN
2.0000 g | Freq: Once | INTRAVENOUS | Status: AC
  Administered 2023-05-14: 2 g via INTRAVENOUS
  Filled 2023-05-14: qty 50

## 2023-05-14 MED ORDER — SODIUM CHLORIDE 0.9 % IV SOLN
Freq: Once | INTRAVENOUS | Status: AC
  Filled 2023-05-14: qty 250

## 2023-05-14 MED ORDER — POTASSIUM CHLORIDE IN NACL 20-0.9 MEQ/L-% IV SOLN
Freq: Once | INTRAVENOUS | Status: AC
  Filled 2023-05-14: qty 1000

## 2023-05-14 NOTE — Progress Notes (Signed)
Per Dr Alena Bills okay to run post cisplatin fluids with cisplatin

## 2023-05-14 NOTE — Patient Instructions (Signed)
[facility] [place] [facility]  Discharge Instructions: Thank you for choosing [facility] to provide your oncology and hematology care.  If you have a lab appointment with the Cancer Center, please go directly to the Cancer Center and check in at the registration area.  Wear comfortable clothing and clothing appropriate for easy access to any Portacath or PICC line.   We strive to give you quality time with your provider. You may need to reschedule your appointment if you arrive late (15 or more minutes).  Arriving late affects you and other patients whose appointments are after yours.  Also, if you miss three or more appointments without notifying the office, you may be dismissed from the clinic at the provider's discretion.      For prescription refill requests, have your pharmacy contact our office and allow 72 hours for refills to be completed.    Today you received the following chemotherapy and/or immunotherapy agents Cisplatin, Gemzar      To help prevent nausea and vomiting after your treatment, we encourage you to take your nausea medication as directed.  BELOW ARE SYMPTOMS THAT SHOULD BE REPORTED IMMEDIATELY: *FEVER GREATER THAN 100.4 F (38 C) OR HIGHER *CHILLS OR SWEATING *NAUSEA AND VOMITING THAT IS NOT CONTROLLED WITH YOUR NAUSEA MEDICATION *UNUSUAL SHORTNESS OF BREATH *UNUSUAL BRUISING OR BLEEDING *URINARY PROBLEMS (pain or burning when urinating, or frequent urination) *BOWEL PROBLEMS (unusual diarrhea, constipation, pain near the anus) TENDERNESS IN MOUTH AND THROAT WITH OR WITHOUT PRESENCE OF ULCERS (sore throat, sores in mouth, or a toothache) UNUSUAL RASH, SWELLING OR PAIN  UNUSUAL VAGINAL DISCHARGE OR ITCHING   Items with * indicate a potential emergency and should be followed up as soon as possible or go to the Emergency Department if any problems should occur.  Please show the CHEMOTHERAPY ALERT CARD or IMMUNOTHERAPY ALERT CARD at  check-in to the Emergency Department and triage nurse.  Should you have questions after your visit or need to cancel or reschedule your appointment, please contact [facility] [place] [facility]  [phone] and follow the prompts.  Office hours are 8:00 a.m. to 4:30 p.m. Monday - Friday. Please note that voicemails left after 4:00 p.m. may not be returned until the following business day.  We are closed weekends and major holidays. You have access to a nurse at all times for urgent questions. Please call the main number to the clinic [phone] and follow the prompts.  For any non-urgent questions, you may also contact your provider using MyChart. We now offer e-Visits for anyone 18 and older to request care online for non-urgent symptoms. For details visit [email or web address].   Also download the MyChart app! Go to the app store, search "MyChart", open the app, select [place], and log in with your MyChart username and password.    

## 2023-05-14 NOTE — Progress Notes (Signed)
[facility] CONSULT NOTE  Patient Care Team: McDonough, Renard Matter as PCP - General (Physician Assistant)  REFERRING PROVIDER: Dr. Apolinar Junes  REASON FOR REFFERAL: Muscle invasive bladder cancer  CANCER STAGING   Cancer Staging  Urothelial cancer [facility]) Staging form: Kidney, AJCC 8th Edition - Clinical: Stage III (cT3, cN0, cM0) - Signed by Michaelyn Barter, MD on 05/07/2023 Stage prefix: Initial diagnosis   ASSESSMENT & PLAN:  Barbara [name] 62 y.o. female with pmh of anxiety, GERD, right carotid artery stenosis was referred to medical oncology for new diagnosis of muscle invasive urothelial cancer.  # Muscle invasive urothelial carcinoma, cT3N0M0 # Moderate left hydroureteronephrosis s/p left ureteral stent - Patient developed urinary symptoms around September or October 2023.  Was treated with multiple rounds of antibiotics with no improvement.  She was then referred to Dr. Apolinar Junes for further evaluation.  - CTU from 03/22/2023 showed moderate left hydroureteronephrosis to the UVJ, marked leftward asymmetric wall thickening of the urinary bladder measuring 18 mm in maximal thickness which extends over the UVJ.  Perivesical stranding soft tissue.  No evidence of upper tract or abdominopelvic metastatic disease.  - s/p TURBT with left ureteral stent placement by Dr. Apolinar Junes.  IntraOp findings showed large irregular mucosal submucosal extensive tumor from the left dome, left lateral bladder wall majority of trigone including the left UO and bladder neck.  Measures at least 5 x 5 cm. Pathology showed invasive high-grade urothelial carcinoma.  There is some discrepancy.  Lower down in the report mentions low-grade.  Invades muscularis propria.  -CT chest without contrast did not show any evidence of metastatic disease.  Showed mild COPD changes, there is aortic atherosclerosis as well as atherosclerosis of great vessels of mediastinum and coronary arteries including calcified  atherosclerotic plaque in the left main, left anterior descending and left circumflex coronary artery.  Calcifications of the aortic valve.  -Labs reviewed.  CMP showed creatinine 1.05.  Creatinine clearance is 48 which is stable.  Will proceed with cycle 1 day 8 of cisplatin split dose 35 mg/m and gemcitabine 1000 mg/m.  Due to her borderline kidney function I favor close monitoring.  I will have her come for BMP only check next week.  She is scheduled for a Udenyca injection tomorrow.  # Coronary atherosclerosis -Detected as incidental finding on CT done for staging purposes. Her last lipid panel showed LDL of 138.  Advised to discuss with primary about role of statin.  Rest per primary.  # Mild COPD changes -On CT's chest.  Prior history of smoking.  Asymptomatic.  # Access-port placed on 05/03/2023  Orders Placed This Encounter  Procedures   CBC with Differential (Cancer Center Only)    Standing Status:   Future    Standing Expiration Date:   05/27/2024   CMP (Cancer Center only)    Standing Status:   Future    Standing Expiration Date:   05/27/2024   Magnesium    Standing Status:   Future    Standing Expiration Date:   05/27/2024   CBC with Differential (Cancer Center Only)    Standing Status:   Future    Standing Expiration Date:   06/03/2024   CMP (Cancer Center only)    Standing Status:   Future    Standing Expiration Date:   06/03/2024   Magnesium    Standing Status:   Future    Standing Expiration Date:   06/03/2024   Basic Metabolic Panel - Cancer Center Only  Standing Status:   Future    Standing Expiration Date:   05/13/2024   RTC in 1 week for BMP only RTC in 2 weeks for MD visit, labs, cycle 2-day 1 of cisplatin and gemcitabine.  The total time spent in the appointment was 30 minutes encounter with patients including review of chart and various tests results, discussions about plan of care and coordination of care plan   All questions were answered. The patient knows  to call the clinic with any problems, questions or concerns. No barriers to learning was detected.  Michaelyn Barter, MD 7/23/202410:07 AM   HISTORY OF PRESENTING ILLNESS:  Barbara [name] 62 y.o. female with pmh of pmh of anxiety, GERD, right carotid artery stenosis was referred to medical oncology for new diagnosis of muscle invasive urothelial cancer.  Patient develops urinary symptoms around September or October 2023.  Was treated with multiple rounds of antibiotics with no improvement.  She was then referred to Dr. Apolinar Junes for further evaluation.  CTU from 03/22/2023 showed moderate left hydroureteronephrosis to the UVJ, marked leftward asymmetric wall thickening of the urinary bladder measuring 18 mm in maximal thickness which extends over the UVJ.  Perivesical stranding soft tissue.  No evidence of upper tract or abdominopelvic metastatic disease.  s/p TURBT with left ureteral stent placement by Dr. Apolinar Junes.  IntraOp findings showed large irregular mucosal submucosal extensive tumor from the left dome, left lateral bladder wall majority of trigone including the left UO and bladder neck.  Measures at least 5 x 5 cm.  Pathology showed invasive high-grade urothelial carcinoma.  There is some discrepancy.  Lower down in the report mentions low-grade.  Invades muscularis propria.  Interval history Patient was seen today prior to cycle 1 day 8 of cisplatin and gemcitabine. She had issues with nausea.  Moderately manageable with Decadron and Zofran.  Did not use Compazine.  The most bothersome symptom for her was salty taste in her mouth.  Appetite is good.  No vomiting.  Denies neuropathy or changes with hearing or tinnitus.  I have reviewed her chart and materials related to her cancer extensively and collaborated history with the patient. Summary of oncologic history is as follows: Oncology History  Urothelial cancer (HCC)  04/12/2023 Initial Diagnosis   Urothelial cancer (HCC)   04/12/2023  Cancer Staging   Staging form: Kidney, AJCC 8th Edition - Clinical: Stage III (cT3, cN0, cM0) - Signed by Michaelyn Barter, MD on 05/07/2023 Stage prefix: Initial diagnosis   Malignant neoplasm of urinary bladder (HCC)  04/22/2023 Initial Diagnosis   Malignant neoplasm of urinary bladder (HCC)   05/07/2023 -  Chemotherapy   Patient is on Treatment Plan : BLADDER Gemcitabine D1,8 + Cisplatin (split dose) D1,8 q21d x 4 cycles       MEDICAL HISTORY:  Past Medical History:  Diagnosis Date   Adopted    Allergic rhinitis    Anemia    h/o   Anxiety    Arthritis    B12 deficiency    Bilateral carotid artery stenosis    Bilateral carotid bruits    Bladder tumor    Depression    Gallbladder polyp    GERD (gastroesophageal reflux disease)    Hydronephrosis of left kidney    Migraines    Tobacco abuse    UTI (urinary tract infection)     SURGICAL HISTORY: Past Surgical History:  Procedure Laterality Date   ABDOMINAL HYSTERECTOMY     BLADDER SURGERY     BREAST BIOPSY  Left 2014   neg   CYSTOSCOPY W/ URETERAL STENT PLACEMENT Left 04/04/2023   Procedure: CYSTOSCOPY WITH RETROGRADE PYELOGRAM/URETERAL STENT PLACEMENT;  Surgeon: Vanna Scotland, MD;  Location: [facility] ORS;  Service: Urology;  Laterality: Left;   FOOT SURGERY Right    2009   IR IMAGING GUIDED PORT INSERTION  05/03/2023   LAPAROSCOPIC APPENDECTOMY  12/05/2012   NASAL SEPTUM SURGERY     SISTRUNK PROCEDURE  03/03/2012   Thyroglossal duct cyst excision   TONSILLECTOMY     TRANSURETHRAL RESECTION OF BLADDER TUMOR N/A 04/04/2023   Procedure: TRANSURETHRAL RESECTION OF BLADDER TUMOR (TURBT);  Surgeon: Vanna Scotland, MD;  Location: [facility] ORS;  Service: Urology;  Laterality: N/A;    SOCIAL HISTORY: Social History   Socioeconomic History   Marital status: Married    Spouse name: Not on file   Number of children: Not on file   Years of education: Not on file   Highest education level: Not on file  Occupational History   Not  on file  Tobacco Use   Smoking status: Former    Current packs/day: 0.25    Average packs/day: 0.3 packs/day for 30.0 years (7.5 ttl pk-yrs)    Types: Cigarettes    Passive exposure: Past   Smokeless tobacco: Never  Vaping Use   Vaping status: Never Used  Substance and Sexual Activity   Alcohol use: Never   Drug use: Never   Sexual activity: Not on file  Other Topics Concern   Not on file  Social History Narrative   Not on file   Social Determinants of Health   Financial Resource Strain: Not on file  Food Insecurity: No Food Insecurity (04/12/2023)   Hunger Vital Sign    Worried About Running Out of Food in the Last Year: Never true    Ran Out of Food in the Last Year: Never true  Transportation Needs: No Transportation Needs (04/12/2023)   PRAPARE - Administrator, Civil Service (Medical): No    Lack of Transportation (Non-Medical): No  Physical Activity: Not on file  Stress: Not on file  Social Connections: Not on file  Intimate Partner Violence: Not At Risk (04/12/2023)   Humiliation, Afraid, Rape, and Kick questionnaire    Fear of Current or Ex-Partner: No    Emotionally Abused: No    Physically Abused: No    Sexually Abused: No    FAMILY HISTORY: Family History  Adopted: Yes    ALLERGIES:  is allergic to pseudoephedrine hcl.  MEDICATIONS:  Current Outpatient Medications  Medication Sig Dispense Refill   dexamethasone (DECADRON) 4 MG tablet Take 2 tablets (8 mg total) by mouth daily. For 3 days starting the day after cisplatin chemotherapy.  Take with food. 60 tablet 0   estradiol (ESTRACE) 1 MG tablet TAKE 1 TABLET BY MOUTH DAILY 90 tablet 3   HYDROcodone-acetaminophen (NORCO/VICODIN) 5-325 MG tablet Take 1 tablet by mouth every 6 (six) hours as needed for moderate pain. 20 tablet 0   lidocaine-prilocaine (EMLA) cream Apply 1 Application topically as needed. 30 g 0   ondansetron (ZOFRAN) 8 MG tablet Take 1 tablet (8 mg total) by mouth every 8  (eight) hours as needed for up to 30 doses for nausea or vomiting. 30 tablet 1   oxybutynin (DITROPAN) 5 MG tablet Take 1 tablet (5 mg total) by mouth every 8 (eight) hours as needed for bladder spasms. 30 tablet 0   potassium chloride SA (KLOR-CON M) 20 MEQ tablet Take 1  tablet (20 mEq total) by mouth daily for 2 days. 2 tablet 0   prochlorperazine (COMPAZINE) 10 MG tablet Take 1 tablet (10 mg total) by mouth every 6 (six) hours as needed for nausea or vomiting. 30 tablet 0   topiramate (TOPAMAX) 50 MG tablet Take 2 tablets (100 mg total) by mouth at bedtime. 180 tablet 3   trimethoprim (TRIMPEX) 100 MG tablet Take 1 tablet (100 mg total) by mouth daily. 30 tablet 11   celecoxib (CELEBREX) 200 MG capsule TAKE 1 CAPSULE BY MOUTH DAILY (Patient not taking: Reported on 05/07/2023) 90 capsule 3   ibuprofen (ADVIL) 200 MG tablet Take 800 mg by mouth 2 (two) times daily. (Patient not taking: Reported on 05/07/2023)     No current facility-administered medications for this visit.   Facility-Administered Medications Ordered in Other Visits  Medication Dose Route Frequency Provider Last Rate Last Admin   0.9 %  sodium chloride infusion   Intravenous Once Michaelyn Barter, MD       CISplatin (PLATINOL) 40 mg in sodium chloride 0.9 % 250 mL chemo infusion  25 mg/m2 (Treatment Plan Recorded) Intravenous Once Michaelyn Barter, MD       dexamethasone (DECADRON) 10 mg in sodium chloride 0.9 % 50 mL IVPB  10 mg Intravenous Once Michaelyn Barter, MD       fosaprepitant (EMEND) 150 mg in sodium chloride 0.9 % 145 mL IVPB  150 mg Intravenous Once Michaelyn Barter, MD       gemcitabine (GEMZAR) 1,596 mg in sodium chloride 0.9 % 250 mL chemo infusion  1,000 mg/m2 (Treatment Plan Recorded) Intravenous Once Michaelyn Barter, MD       heparin lock flush 100 unit/mL  500 Units Intracatheter Once PRN Michaelyn Barter, MD       magnesium sulfate IVPB 2 g 50 mL  2 g Intravenous Once Michaelyn Barter, MD 50 mL/hr at 05/14/23 0958 2 g  at 05/14/23 0958   palonosetron (ALOXI) injection 0.25 mg  0.25 mg Intravenous Once Michaelyn Barter, MD        REVIEW OF SYSTEMS:   Pertinent information mentioned in HPI All other systems were reviewed with the patient and are negative.  PHYSICAL EXAMINATION: ECOG PERFORMANCE STATUS: 1 - Symptomatic but completely ambulatory  Vitals:   05/14/23 0904  BP: 110/73  Pulse: 76  Temp: 97.6 F (36.4 C)  SpO2: 100%   Filed Weights   05/14/23 0904  Weight: 121 lb (54.9 kg)    GENERAL:alert, no distress and comfortable SKIN: skin color, texture, turgor are normal, no rashes or significant lesions EYES: normal, conjunctiva are pink and non-injected, sclera clear OROPHARYNX:no exudate, no erythema and lips, buccal mucosa, and tongue normal  NECK: supple, thyroid normal size, non-tender, without nodularity LYMPH:  no palpable lymphadenopathy in the cervical, axillary or inguinal LUNGS: clear to auscultation and percussion with normal breathing effort HEART: regular rate & rhythm and no murmurs and no lower extremity edema ABDOMEN:abdomen soft, non-tender and normal bowel sounds Musculoskeletal:no cyanosis of digits and no clubbing  PSYCH: alert & oriented x 3 with fluent speech NEURO: no focal motor/sensory deficits  LABORATORY DATA:  I have reviewed the data as listed Lab Results  Component Value Date   WBC 5.1 05/14/2023   HGB 11.7 (L) 05/14/2023   HCT 34.7 (L) 05/14/2023   MCV 94.0 05/14/2023   PLT 166 05/14/2023   Recent Labs    04/12/23 1331 04/19/23 1056 05/07/23 0846 05/14/23 0844  NA 138 137 135 136  K 4.2 3.7 3.3* 3.5  CL 108 105 107 105  CO2 22 24 21* 24  GLUCOSE 81 92 102* 153*  BUN 26* 16 26* 27*  CREATININE 1.18* 1.24* 1.15* 1.05*  CALCIUM 8.7* 9.0 8.4* 8.2*  GFRNONAA 52* 49* 54* >60  PROT 7.0  --  7.2 6.5  ALBUMIN 3.9  --  3.9 3.5  AST 14*  --  15 21  ALT 9  --  9 18  ALKPHOS 33*  --  37* 46  BILITOT 0.7  --  0.5 0.9    RADIOGRAPHIC STUDIES: I  have personally reviewed the radiological images as listed and agreed with the findings in the report. IR IMAGING GUIDED PORT INSERTION  Result Date: 05/03/2023 INDICATION: Bladder malignancy, Chemotherapy EXAM: Chest port placement using ultrasound and fluoroscopic guidance MEDICATIONS: Documented in the EMR ANESTHESIA/SEDATION: Moderate (conscious) sedation was employed during this procedure. A total of Versed 2 mg and Fentanyl 100 mcg was administered intravenously. Moderate Sedation Time: 25 minutes. The patient's level of consciousness and vital signs were monitored continuously by radiology nursing throughout the procedure under my direct supervision. FLUOROSCOPY TIME:  Fluoroscopy Time: 0.3 minutes (< 1 mGy) COMPLICATIONS: None immediate. PROCEDURE: Informed written consent was obtained from the patient after a thorough discussion of the procedural risks, benefits and alternatives. All questions were addressed. Maximal Sterile Barrier Technique was utilized including caps, mask, sterile gowns, sterile gloves, sterile drape, hand hygiene and skin antiseptic. A timeout was performed prior to the initiation of the procedure. The patient was placed supine on the exam table. The right neck and chest was prepped and draped in the standard sterile fashion. A preliminary ultrasound of the right neck was performed and demonstrates a patent right internal jugular vein. A permanent ultrasound image was stored in the electronic medical record. The overlying skin was anesthetized with 1% Lidocaine. Using ultrasound guidance, access was obtained into the right internal jugular vein using a 21 gauge micropuncture set. A wire was advanced into the SVC, a short incision was made at the puncture site, and serial dilatation performed. Next, in an ipsilateral infraclavicular location, an incision was made at the site of the subcutaneous reservoir. Blunt dissection was used to open a pocket to contain the reservoir. A  subcutaneous tunnel was then created from the port site to the puncture site. A(n) 8 Fr single lumen catheter was advanced through the tunnel. The catheter was attached to the port and this was placed in the subcutaneous pocket. Under fluoroscopic guidance, a peel away sheath was placed, and the catheter was trimmed to the appropriate length and was advanced into the central veins. The catheter length is 24 cm. The tip of the catheter lies near the superior cavoatrial junction. The port flushes and aspirates appropriately. The port was flushed and locked with heparinized saline. The port pocket was closed in 2 layers using 3-0 and 4-0 Vicryl/absorbable suture. Dermabond was also applied to both incisions. The patient tolerated the procedure well and was transferred to recovery in stable condition. IMPRESSION: Successful placement of a right-sided chest port via the right internal jugular vein. The port is ready for immediate use. Electronically Signed   By: Olive Bass M.D.   On: 05/03/2023 14:57   CT CHEST WO CONTRAST  Result Date: 04/25/2023 CLINICAL DATA:  62 year old female with history of bladder cancer. Evaluate for metastatic disease. * Tracking Code: BO * EXAM: CT CHEST WITHOUT CONTRAST TECHNIQUE: Multidetector CT imaging of the chest was performed following the  standard protocol without IV contrast. RADIATION DOSE REDUCTION: This exam was performed according to the departmental dose-optimization program which includes automated exposure control, adjustment of the mA and/or kV according to patient size and/or use of iterative reconstruction technique. COMPARISON:  No prior chest CT. CT the abdomen and pelvis 03/22/2023. FINDINGS: Cardiovascular: Heart size is normal. There is no significant pericardial fluid, thickening or pericardial calcification. There is aortic atherosclerosis, as well as atherosclerosis of the great vessels of the mediastinum and the coronary arteries, including calcified  atherosclerotic plaque in the left main, left anterior descending and left circumflex coronary arteries. Calcifications of the aortic valve. Mediastinum/Nodes: Mediastinal or hilar no pathologically enlarged lymph nodes. Please note that accurate exclusion of hilar adenopathy is limited on noncontrast CT scans. Esophagus is unremarkable in appearance. No axillary lymphadenopathy. Lungs/Pleura: No suspicious appearing pulmonary nodules or masses are noted. No acute consolidative airspace disease. No pleural effusions. Mild diffuse bronchial wall thickening with very mild centrilobular and paraseptal emphysema. Upper Abdomen: Aortic atherosclerosis. There is what appears to be a left ureteral stent partially imaged in the region of the left renal pelvis. Musculoskeletal: There are no aggressive appearing lytic or blastic lesions noted in the visualized portions of the skeleton. IMPRESSION: 1. No findings to suggest metastatic disease to the thorax. 2. Mild diffuse bronchial wall thickening with very mild centrilobular and paraseptal emphysema; imaging findings suggestive of underlying COPD. 3. Aortic atherosclerosis, in addition to left main and 2 vessel coronary artery disease. Please note that although the presence of coronary artery calcium documents the presence of coronary artery disease, the severity of this disease and any potential stenosis cannot be assessed on this non-gated CT examination. Assessment for potential risk factor modification, dietary therapy or pharmacologic therapy may be warranted, if clinically indicated. 4. There are severe calcifications of the aortic valve. Echocardiographic correlation for evaluation of potential valvular dysfunction may be warranted if clinically indicated. Aortic Atherosclerosis (ICD10-I70.0) and Emphysema (ICD10-J43.9). Electronically Signed   By: Trudie Reed M.D.   On: 04/25/2023 11:02

## 2023-05-14 NOTE — Progress Notes (Signed)
Patient is having the salty taste in her mouth and upset stomach with nausea. She also says that she is belching a lot.

## 2023-05-15 ENCOUNTER — Inpatient Hospital Stay: Payer: Managed Care, Other (non HMO)

## 2023-05-15 DIAGNOSIS — Z5111 Encounter for antineoplastic chemotherapy: Secondary | ICD-10-CM | POA: Diagnosis not present

## 2023-05-15 DIAGNOSIS — C679 Malignant neoplasm of bladder, unspecified: Secondary | ICD-10-CM

## 2023-05-15 MED ORDER — PEGFILGRASTIM-CBQV 6 MG/0.6ML ~~LOC~~ SOSY
6.0000 mg | PREFILLED_SYRINGE | Freq: Once | SUBCUTANEOUS | Status: AC
  Administered 2023-05-15: 6 mg via SUBCUTANEOUS
  Filled 2023-05-15: qty 0.6

## 2023-05-24 ENCOUNTER — Encounter: Payer: Self-pay | Admitting: Internal Medicine

## 2023-05-28 ENCOUNTER — Encounter: Payer: Self-pay | Admitting: Internal Medicine

## 2023-05-28 ENCOUNTER — Inpatient Hospital Stay: Payer: Managed Care, Other (non HMO) | Attending: Internal Medicine

## 2023-05-28 ENCOUNTER — Inpatient Hospital Stay: Payer: Managed Care, Other (non HMO)

## 2023-05-28 ENCOUNTER — Inpatient Hospital Stay (HOSPITAL_BASED_OUTPATIENT_CLINIC_OR_DEPARTMENT_OTHER): Payer: Managed Care, Other (non HMO) | Admitting: Internal Medicine

## 2023-05-28 DIAGNOSIS — Z5111 Encounter for antineoplastic chemotherapy: Secondary | ICD-10-CM | POA: Diagnosis present

## 2023-05-28 DIAGNOSIS — Z79899 Other long term (current) drug therapy: Secondary | ICD-10-CM | POA: Insufficient documentation

## 2023-05-28 DIAGNOSIS — C679 Malignant neoplasm of bladder, unspecified: Secondary | ICD-10-CM

## 2023-05-28 DIAGNOSIS — Z5189 Encounter for other specified aftercare: Secondary | ICD-10-CM | POA: Insufficient documentation

## 2023-05-28 DIAGNOSIS — R110 Nausea: Secondary | ICD-10-CM | POA: Diagnosis not present

## 2023-05-28 LAB — CMP (CANCER CENTER ONLY)
ALT: 12 U/L (ref 0–44)
AST: 18 U/L (ref 15–41)
Albumin: 3.7 g/dL (ref 3.5–5.0)
Alkaline Phosphatase: 62 U/L (ref 38–126)
Anion gap: 7 (ref 5–15)
BUN: 18 mg/dL (ref 8–23)
CO2: 23 mmol/L (ref 22–32)
Calcium: 8.6 mg/dL — ABNORMAL LOW (ref 8.9–10.3)
Chloride: 107 mmol/L (ref 98–111)
Creatinine: 1.27 mg/dL — ABNORMAL HIGH (ref 0.44–1.00)
GFR, Estimated: 48 mL/min — ABNORMAL LOW (ref >60)
Glucose, Bld: 131 mg/dL — ABNORMAL HIGH (ref 70–99)
Potassium: 4 mmol/L (ref 3.5–5.1)
Sodium: 137 mmol/L (ref 135–145)
Total Bilirubin: 0.2 mg/dL — ABNORMAL LOW (ref 0.3–1.2)
Total Protein: 6.6 g/dL (ref 6.5–8.1)

## 2023-05-28 LAB — CBC WITH DIFFERENTIAL (CANCER CENTER ONLY)
Abs Immature Granulocytes: 0.82 10*3/uL — ABNORMAL HIGH (ref 0.00–0.07)
Basophils Absolute: 0 10*3/uL (ref 0.0–0.1)
Basophils Relative: 0 %
Eosinophils Absolute: 0.1 10*3/uL (ref 0.0–0.5)
Eosinophils Relative: 1 %
HCT: 31.1 % — ABNORMAL LOW (ref 36.0–46.0)
Hemoglobin: 10.1 g/dL — ABNORMAL LOW (ref 12.0–15.0)
Immature Granulocytes: 8 %
Lymphocytes Relative: 25 %
Lymphs Abs: 2.5 10*3/uL (ref 0.7–4.0)
MCH: 31.6 pg (ref 26.0–34.0)
MCHC: 32.5 g/dL (ref 30.0–36.0)
MCV: 97.2 fL (ref 80.0–100.0)
Monocytes Absolute: 1.1 10*3/uL — ABNORMAL HIGH (ref 0.1–1.0)
Monocytes Relative: 11 %
Neutro Abs: 5.3 10*3/uL (ref 1.7–7.7)
Neutrophils Relative %: 55 %
Platelet Count: 390 10*3/uL (ref 150–400)
RBC: 3.2 MIL/uL — ABNORMAL LOW (ref 3.87–5.11)
RDW: 13.2 % (ref 11.5–15.5)
WBC Count: 9.8 10*3/uL (ref 4.0–10.5)
nRBC: 0 % (ref 0.0–0.2)

## 2023-05-28 LAB — MAGNESIUM: Magnesium: 2 mg/dL (ref 1.7–2.4)

## 2023-05-28 MED ORDER — SENNA 8.6 MG PO TABS: 2.0000 | ORAL_TABLET | Freq: Every day | ORAL | 2 refills | Status: AC | PRN

## 2023-05-28 MED ORDER — HEPARIN SOD (PORK) LOCK FLUSH 100 UNIT/ML IV SOLN
500.0000 [IU] | Freq: Once | INTRAVENOUS | Status: AC
  Administered 2023-05-28: 500 [IU] via INTRAVENOUS
  Filled 2023-05-28: qty 5

## 2023-05-28 MED ORDER — DOCUSATE SODIUM 100 MG PO CAPS: 100.0000 mg | ORAL_CAPSULE | Freq: Two times a day (BID) | ORAL | 2 refills | Status: AC

## 2023-05-28 MED ORDER — SODIUM CHLORIDE 0.9% FLUSH
10.0000 mL | Freq: Once | INTRAVENOUS | Status: AC
  Administered 2023-05-28: 10 mL via INTRAVENOUS
  Filled 2023-05-28: qty 10

## 2023-05-28 MED ORDER — SODIUM CHLORIDE 0.9 % IV SOLN
INTRAVENOUS | Status: DC
  Filled 2023-05-28 (×2): qty 250

## 2023-05-28 MED FILL — Fosaprepitant Dimeglumine For IV Infusion 150 MG (Base Eq): INTRAVENOUS | Qty: 5 | Status: AC

## 2023-05-28 MED FILL — Dexamethasone Sodium Phosphate Inj 100 MG/10ML: INTRAMUSCULAR | Qty: 1 | Status: AC

## 2023-05-28 NOTE — Progress Notes (Signed)
[facility] CONSULT NOTE  Patient Care Team: McDonough, Renard Matter as PCP - General (Physician Assistant) Michaelyn Barter, MD as Consulting Physician (Oncology)  REFERRING PROVIDER: Dr. Apolinar Junes  REASON FOR REFFERAL: Muscle invasive bladder cancer  CANCER STAGING   Cancer Staging  Urothelial cancer [facility] Staging form: Kidney, AJCC 8th Edition - Clinical: Stage III (cT3, cN0, cM0) - Signed by Michaelyn Barter, MD on 05/07/2023 Stage prefix: Initial diagnosis   ASSESSMENT & PLAN:  Vesta Mixer 62 y.o. female with pmh of anxiety, GERD, right carotid artery stenosis was referred to medical oncology for new diagnosis of muscle invasive urothelial cancer.  # Muscle invasive urothelial carcinoma, cT3N0M0 # Moderate left hydroureteronephrosis s/p left ureteral stent - Patient developed urinary symptoms around September or October 2023.  Was treated with multiple rounds of antibiotics with no improvement.  She was then referred to Dr. Apolinar Junes for further evaluation.  - CTU from 03/22/2023 showed moderate left hydroureteronephrosis to the UVJ, marked leftward asymmetric wall thickening of the urinary bladder measuring 18 mm in maximal thickness which extends over the UVJ.  Perivesical stranding soft tissue.  No evidence of upper tract or abdominopelvic metastatic disease.  - s/p TURBT with left ureteral stent placement by Dr. Apolinar Junes.  IntraOp findings showed large irregular mucosal submucosal extensive tumor from the left dome, left lateral bladder wall majority of trigone including the left UO and bladder neck.  Measures at least 5 x 5 cm. Pathology showed invasive high-grade urothelial carcinoma.  There is some discrepancy.  Lower down in the report mentions low-grade.  Invades muscularis propria.  -CT chest without contrast did not show any evidence of metastatic disease.  Showed mild COPD changes, there is aortic atherosclerosis as well as atherosclerosis of great vessels of  mediastinum and coronary arteries including calcified atherosclerotic plaque in the left main, left anterior descending and left circumflex coronary artery.  Calcifications of the aortic valve.  -Labs reviewed.  CBC showed WBC 9.8, hemoglobin 10.1, platelet 390.  Creatinine mildly increased from 1.05-1.27.  Her creatinine has been fluctuating between 1.0-1.2.  Creatinine clearance is fluctuating between 40-48.  Considering it has not been a consistent uptrend in her creatinine level I will closely monitor it and will cautiously continue with cisplatin split dose and gemcitabine.  I will decrease cisplatin from 35 to 25 mg/m.  Continue with same dose of gemcitabine 1000 mg/m.  Will give her 1 L normal saline today.  # Chemotherapy induced anemia -Hemoglobin 10.1.  Monitor.  # Chemo induced nausea -Continue with dexamethasone for 3 days post chemo.  Continue with Zofran and also advised the patient to add Compazine as needed  # Constipation-does not like MiraLAX.  Sent prescription for Colace and senna  # Acid reflux/gas/bloating-advised to take OTC simethicone, Tums, Pepcid as needed.  # Coronary atherosclerosis -Detected as incidental finding on CT done for staging purposes. Her last lipid panel showed LDL of 138.  Advised to discuss with primary about role of statin.  Rest per primary.  # Mild COPD changes -On CT's chest.  Prior history of smoking.  Asymptomatic.  # Access-port placed on 05/03/2023  No orders of the defined types were placed in this encounter.  RTC in 1 week for MD visit, labs, cycle 2-day 8 of cisplatin and gemcitabine  The total time spent in the appointment was 30 minutes encounter with patients including review of chart and various tests results, discussions about plan of care and coordination of care plan  All questions were answered. The patient knows to call the clinic with any problems, questions or concerns. No barriers to learning was detected.  Michaelyn Barter, MD 8/6/202412:20 PM   HISTORY OF PRESENTING ILLNESS:  Vesta Mixer 62 y.o. female with pmh of pmh of anxiety, GERD, right carotid artery stenosis was referred to medical oncology for new diagnosis of muscle invasive urothelial cancer.  Patient develops urinary symptoms around September or October 2023.  Was treated with multiple rounds of antibiotics with no improvement.  She was then referred to Dr. Apolinar Junes for further evaluation.  CTU from 03/22/2023 showed moderate left hydroureteronephrosis to the UVJ, marked leftward asymmetric wall thickening of the urinary bladder measuring 18 mm in maximal thickness which extends over the UVJ.  Perivesical stranding soft tissue.  No evidence of upper tract or abdominopelvic metastatic disease.  s/p TURBT with left ureteral stent placement by Dr. Apolinar Junes.  IntraOp findings showed large irregular mucosal submucosal extensive tumor from the left dome, left lateral bladder wall majority of trigone including the left UO and bladder neck.  Measures at least 5 x 5 cm.  Pathology showed invasive high-grade urothelial carcinoma.  There is some discrepancy.  Lower down in the report mentions low-grade.  Invades muscularis propria.  Interval history Patient was seen today prior to cycle 2 day 1 of cisplatin and gemcitabine. Overall tolerating treatment well.  Does not have that salty taste in her mouth.  But still does not feel right in her mouth.  Which could be coming from chemo.  Had transient episode of ringing sensation in the right ear which lasted about 2 minutes.  Denies any neuropathy.  Has nausea taking dexamethasone and Zofran.  No vomiting.  Energy levels are down on the first 5 days of treatment.  Reports gas and bloating.  Constipation.    I have reviewed her chart and materials related to her cancer extensively and collaborated history with the patient. Summary of oncologic history is as follows: Oncology History  Urothelial cancer (HCC)   04/12/2023 Initial Diagnosis   Urothelial cancer (HCC)   04/12/2023 Cancer Staging   Staging form: Kidney, AJCC 8th Edition - Clinical: Stage III (cT3, cN0, cM0) - Signed by Michaelyn Barter, MD on 05/07/2023 Stage prefix: Initial diagnosis   Malignant neoplasm of urinary bladder (HCC)  04/22/2023 Initial Diagnosis   Malignant neoplasm of urinary bladder (HCC)   05/07/2023 -  Chemotherapy   Patient is on Treatment Plan : BLADDER Gemcitabine D1,8 + Cisplatin (split dose) D1,8 q21d x 4 cycles       MEDICAL HISTORY:  Past Medical History:  Diagnosis Date   Adopted    Allergic rhinitis    Anemia    h/o   Anxiety    Arthritis    B12 deficiency    Bilateral carotid artery stenosis    Bilateral carotid bruits    Bladder tumor    Depression    Gallbladder polyp    GERD (gastroesophageal reflux disease)    Hydronephrosis of left kidney    Migraines    Tobacco abuse    UTI (urinary tract infection)     SURGICAL HISTORY: Past Surgical History:  Procedure Laterality Date   ABDOMINAL HYSTERECTOMY     BLADDER SURGERY     BREAST BIOPSY Left 2014   neg   CYSTOSCOPY W/ URETERAL STENT PLACEMENT Left 04/04/2023   Procedure: CYSTOSCOPY WITH RETROGRADE PYELOGRAM/URETERAL STENT PLACEMENT;  Surgeon: Vanna Scotland, MD;  Location: [facility] ORS;  Service: Urology;  Laterality: Left;   FOOT SURGERY Right    2009   IR IMAGING GUIDED PORT INSERTION  05/03/2023   LAPAROSCOPIC APPENDECTOMY  12/05/2012   NASAL SEPTUM SURGERY     SISTRUNK PROCEDURE  03/03/2012   Thyroglossal duct cyst excision   TONSILLECTOMY     TRANSURETHRAL RESECTION OF BLADDER TUMOR N/A 04/04/2023   Procedure: TRANSURETHRAL RESECTION OF BLADDER TUMOR (TURBT);  Surgeon: Vanna Scotland, MD;  Location: [facility] ORS;  Service: Urology;  Laterality: N/A;    SOCIAL HISTORY: Social History   Socioeconomic History   Marital status: Married    Spouse name: Not on file   Number of children: Not on file   Years of education: Not on  file   Highest education level: Not on file  Occupational History   Not on file  Tobacco Use   Smoking status: Former    Current packs/day: 0.25    Average packs/day: 0.3 packs/day for 30.0 years (7.5 ttl pk-yrs)    Types: Cigarettes    Passive exposure: Past   Smokeless tobacco: Never  Vaping Use   Vaping status: Never Used  Substance and Sexual Activity   Alcohol use: Never   Drug use: Never   Sexual activity: Not on file  Other Topics Concern   Not on file  Social History Narrative   Not on file   Social Determinants of Health   Financial Resource Strain: Not on file  Food Insecurity: No Food Insecurity (04/12/2023)   Hunger Vital Sign    Worried About Running Out of Food in the Last Year: Never true    Ran Out of Food in the Last Year: Never true  Transportation Needs: No Transportation Needs (04/12/2023)   PRAPARE - Administrator, Civil Service (Medical): No    Lack of Transportation (Non-Medical): No  Physical Activity: Not on file  Stress: Not on file  Social Connections: Not on file  Intimate Partner Violence: Not At Risk (04/12/2023)   Humiliation, Afraid, Rape, and Kick questionnaire    Fear of Current or Ex-Partner: No    Emotionally Abused: No    Physically Abused: No    Sexually Abused: No    FAMILY HISTORY: Family History  Adopted: Yes    ALLERGIES:  is allergic to pseudoephedrine hcl.  MEDICATIONS:  Current Outpatient Medications  Medication Sig Dispense Refill   dexamethasone (DECADRON) 4 MG tablet Take 2 tablets (8 mg total) by mouth daily. For 3 days starting the day after cisplatin chemotherapy.  Take with food. 60 tablet 0   docusate sodium (COLACE) 100 MG capsule Take 1 capsule (100 mg total) by mouth 2 (two) times daily. 60 capsule 2   estradiol (ESTRACE) 1 MG tablet TAKE 1 TABLET BY MOUTH DAILY 90 tablet 3   HYDROcodone-acetaminophen (NORCO/VICODIN) 5-325 MG tablet Take 1 tablet by mouth every 6 (six) hours as needed for  moderate pain. 20 tablet 0   lidocaine-prilocaine (EMLA) cream Apply 1 Application topically as needed. 30 g 0   ondansetron (ZOFRAN) 8 MG tablet Take 1 tablet (8 mg total) by mouth every 8 (eight) hours as needed for up to 30 doses for nausea or vomiting. 30 tablet 1   oxybutynin (DITROPAN) 5 MG tablet Take 1 tablet (5 mg total) by mouth every 8 (eight) hours as needed for bladder spasms. 30 tablet 0   prochlorperazine (COMPAZINE) 10 MG tablet Take 1 tablet (10 mg total) by mouth every 6 (six) hours as needed for nausea or vomiting.  30 tablet 0   senna (SENOKOT) 8.6 MG TABS tablet Take 2 tablets (17.2 mg total) by mouth daily as needed for mild constipation. 60 tablet 2   topiramate (TOPAMAX) 50 MG tablet Take 2 tablets (100 mg total) by mouth at bedtime. 180 tablet 3   trimethoprim (TRIMPEX) 100 MG tablet Take 1 tablet (100 mg total) by mouth daily. 30 tablet 11   celecoxib (CELEBREX) 200 MG capsule TAKE 1 CAPSULE BY MOUTH DAILY (Patient not taking: Reported on 05/28/2023) 90 capsule 3   ibuprofen (ADVIL) 200 MG tablet Take 800 mg by mouth 2 (two) times daily. (Patient not taking: Reported on 05/07/2023)     potassium chloride SA (KLOR-CON M) 20 MEQ tablet Take 1 tablet (20 mEq total) by mouth daily for 2 days. 2 tablet 0   No current facility-administered medications for this visit.   Facility-Administered Medications Ordered in Other Visits  Medication Dose Route Frequency Provider Last Rate Last Admin   0.9 %  sodium chloride infusion   Intravenous Continuous Michaelyn Barter, MD   Stopped at 05/28/23 1157    REVIEW OF SYSTEMS:   Pertinent information mentioned in HPI All other systems were reviewed with the patient and are negative.  PHYSICAL EXAMINATION: ECOG PERFORMANCE STATUS: 1 - Symptomatic but completely ambulatory  Vitals:   05/28/23 0955  BP: 112/64  Pulse: 66  Resp: 18  Temp: (!) 96.2 F (35.7 C)  SpO2: 100%    Filed Weights   05/28/23 0955  Weight: 121 lb 3.2 oz (55  kg)     GENERAL:alert, no distress and comfortable SKIN: skin color, texture, turgor are normal, no rashes or significant lesions EYES: normal, conjunctiva are pink and non-injected, sclera clear OROPHARYNX:no exudate, no erythema and lips, buccal mucosa, and tongue normal  NECK: supple, thyroid normal size, non-tender, without nodularity LYMPH:  no palpable lymphadenopathy in the cervical, axillary or inguinal LUNGS: clear to auscultation and percussion with normal breathing effort HEART: regular rate & rhythm and no murmurs and no lower extremity edema ABDOMEN:abdomen soft, non-tender and normal bowel sounds Musculoskeletal:no cyanosis of digits and no clubbing  PSYCH: alert & oriented x 3 with fluent speech NEURO: no focal motor/sensory deficits  LABORATORY DATA:  I have reviewed the data as listed Lab Results  Component Value Date   WBC 9.8 05/28/2023   HGB 10.1 (L) 05/28/2023   HCT 31.1 (L) 05/28/2023   MCV 97.2 05/28/2023   PLT 390 05/28/2023   Recent Labs    05/07/23 0846 05/14/23 0844 05/28/23 0947  NA 135 136 137  K 3.3* 3.5 4.0  CL 107 105 107  CO2 21* 24 23  GLUCOSE 102* 153* 131*  BUN 26* 27* 18  CREATININE 1.15* 1.05* 1.27*  CALCIUM 8.4* 8.2* 8.6*  GFRNONAA 54* >60 48*  PROT 7.2 6.5 6.6  ALBUMIN 3.9 3.5 3.7  AST 15 21 18   ALT 9 18 12   ALKPHOS 37* 46 62  BILITOT 0.5 0.9 0.2*    RADIOGRAPHIC STUDIES: I have personally reviewed the radiological images as listed and agreed with the findings in the report. IR IMAGING GUIDED PORT INSERTION  Result Date: 05/03/2023 INDICATION: Bladder malignancy, Chemotherapy EXAM: Chest port placement using ultrasound and fluoroscopic guidance MEDICATIONS: Documented in the EMR ANESTHESIA/SEDATION: Moderate (conscious) sedation was employed during this procedure. A total of Versed 2 mg and Fentanyl 100 mcg was administered intravenously. Moderate Sedation Time: 25 minutes. The patient's level of consciousness and vital  signs were monitored continuously by radiology  nursing throughout the procedure under my direct supervision. FLUOROSCOPY TIME:  Fluoroscopy Time: 0.3 minutes (< 1 mGy) COMPLICATIONS: None immediate. PROCEDURE: Informed written consent was obtained from the patient after a thorough discussion of the procedural risks, benefits and alternatives. All questions were addressed. Maximal Sterile Barrier Technique was utilized including caps, mask, sterile gowns, sterile gloves, sterile drape, hand hygiene and skin antiseptic. A timeout was performed prior to the initiation of the procedure. The patient was placed supine on the exam table. The right neck and chest was prepped and draped in the standard sterile fashion. A preliminary ultrasound of the right neck was performed and demonstrates a patent right internal jugular vein. A permanent ultrasound image was stored in the electronic medical record. The overlying skin was anesthetized with 1% Lidocaine. Using ultrasound guidance, access was obtained into the right internal jugular vein using a 21 gauge micropuncture set. A wire was advanced into the SVC, a short incision was made at the puncture site, and serial dilatation performed. Next, in an ipsilateral infraclavicular location, an incision was made at the site of the subcutaneous reservoir. Blunt dissection was used to open a pocket to contain the reservoir. A subcutaneous tunnel was then created from the port site to the puncture site. A(n) 8 Fr single lumen catheter was advanced through the tunnel. The catheter was attached to the port and this was placed in the subcutaneous pocket. Under fluoroscopic guidance, a peel away sheath was placed, and the catheter was trimmed to the appropriate length and was advanced into the central veins. The catheter length is 24 cm. The tip of the catheter lies near the superior cavoatrial junction. The port flushes and aspirates appropriately. The port was flushed and locked with  heparinized saline. The port pocket was closed in 2 layers using 3-0 and 4-0 Vicryl/absorbable suture. Dermabond was also applied to both incisions. The patient tolerated the procedure well and was transferred to recovery in stable condition. IMPRESSION: Successful placement of a right-sided chest port via the right internal jugular vein. The port is ready for immediate use. Electronically Signed   By: Olive Bass M.D.   On: 05/03/2023 14:57

## 2023-05-28 NOTE — Progress Notes (Signed)
Patient is experiencing some nausea, bloating, gas, and constipation. She would like to see if there are any medication options regarding those issues.

## 2023-05-29 ENCOUNTER — Inpatient Hospital Stay: Payer: Managed Care, Other (non HMO)

## 2023-05-29 ENCOUNTER — Other Ambulatory Visit: Payer: Self-pay | Admitting: *Deleted

## 2023-05-29 VITALS — BP 122/70 | HR 65 | Temp 97.5°F | Resp 16

## 2023-05-29 DIAGNOSIS — Z5111 Encounter for antineoplastic chemotherapy: Secondary | ICD-10-CM | POA: Diagnosis not present

## 2023-05-29 DIAGNOSIS — C679 Malignant neoplasm of bladder, unspecified: Secondary | ICD-10-CM

## 2023-05-29 MED ORDER — SODIUM CHLORIDE 0.9 % IV SOLN
150.0000 mg | Freq: Once | INTRAVENOUS | Status: AC
  Administered 2023-05-29: 150 mg via INTRAVENOUS
  Filled 2023-05-29: qty 150

## 2023-05-29 MED ORDER — SODIUM CHLORIDE 0.9 % IV SOLN
Freq: Once | INTRAVENOUS | Status: AC
  Filled 2023-05-29: qty 250

## 2023-05-29 MED ORDER — PALONOSETRON HCL INJECTION 0.25 MG/5ML
0.2500 mg | Freq: Once | INTRAVENOUS | Status: AC
  Administered 2023-05-29: 0.25 mg via INTRAVENOUS
  Filled 2023-05-29: qty 5

## 2023-05-29 MED ORDER — SODIUM CHLORIDE 0.9 % IV SOLN
25.0000 mg/m2 | Freq: Once | INTRAVENOUS | Status: AC
  Administered 2023-05-29: 40 mg via INTRAVENOUS
  Filled 2023-05-29: qty 40

## 2023-05-29 MED ORDER — HEPARIN SOD (PORK) LOCK FLUSH 100 UNIT/ML IV SOLN
500.0000 [IU] | Freq: Once | INTRAVENOUS | Status: AC | PRN
  Administered 2023-05-29: 500 [IU]
  Filled 2023-05-29: qty 5

## 2023-05-29 MED ORDER — SODIUM CHLORIDE 0.9 % IV SOLN
1000.0000 mg/m2 | Freq: Once | INTRAVENOUS | Status: AC
  Administered 2023-05-29: 1596 mg via INTRAVENOUS
  Filled 2023-05-29: qty 41.98

## 2023-05-29 MED ORDER — SODIUM CHLORIDE 0.9 % IV SOLN
10.0000 mg | Freq: Once | INTRAVENOUS | Status: AC
  Administered 2023-05-29: 10 mg via INTRAVENOUS
  Filled 2023-05-29: qty 10

## 2023-05-29 MED ORDER — POTASSIUM CHLORIDE IN NACL 20-0.9 MEQ/L-% IV SOLN
Freq: Once | INTRAVENOUS | Status: AC
  Filled 2023-05-29: qty 1000

## 2023-05-29 MED ORDER — MAGNESIUM SULFATE 2 GM/50ML IV SOLN
2.0000 g | Freq: Once | INTRAVENOUS | Status: AC
  Administered 2023-05-29: 2 g via INTRAVENOUS
  Filled 2023-05-29: qty 50

## 2023-05-29 NOTE — Patient Instructions (Signed)
[facility]  Discharge Instructions: Thank you for choosing [facility] to provide your oncology and hematology care.  If you have a lab appointment with the Cancer Center, please go directly to the Cancer Center and check in at the registration area.  Wear comfortable clothing and clothing appropriate for easy access to any Portacath or PICC line.   We strive to give you quality time with your provider. You may need to reschedule your appointment if you arrive late (15 or more minutes).  Arriving late affects you and other patients whose appointments are after yours.  Also, if you miss three or more appointments without notifying the office, you may be dismissed from the clinic at the provider's discretion.      For prescription refill requests, have your pharmacy contact our office and allow 72 hours for refills to be completed.    Today you received the following chemotherapy and/or immunotherapy agents Cisplatin & Gemzar      To help prevent nausea and vomiting after your treatment, we encourage you to take your nausea medication as directed.  BELOW ARE SYMPTOMS THAT SHOULD BE REPORTED IMMEDIATELY: *FEVER GREATER THAN 100.4 F (38 C) OR HIGHER *CHILLS OR SWEATING *NAUSEA AND VOMITING THAT IS NOT CONTROLLED WITH YOUR NAUSEA MEDICATION *UNUSUAL SHORTNESS OF BREATH *UNUSUAL BRUISING OR BLEEDING *URINARY PROBLEMS (pain or burning when urinating, or frequent urination) *BOWEL PROBLEMS (unusual diarrhea, constipation, pain near the anus) TENDERNESS IN MOUTH AND THROAT WITH OR WITHOUT PRESENCE OF ULCERS (sore throat, sores in mouth, or a toothache) UNUSUAL RASH, SWELLING OR PAIN  UNUSUAL VAGINAL DISCHARGE OR ITCHING   Items with * indicate a potential emergency and should be followed up as soon as possible or go to the Emergency Department if any problems should occur.  Please show the CHEMOTHERAPY ALERT CARD or IMMUNOTHERAPY ALERT CARD at  check-in to the Emergency Department and triage nurse.  Should you have questions after your visit or need to cancel or reschedule your appointment, please contact [facility]  [phone] and follow the prompts.  Office hours are 8:00 a.m. to 4:30 p.m. Monday - Friday. Please note that voicemails left after 4:00 p.m. may not be returned until the following business day.  We are closed weekends and major holidays. You have access to a nurse at all times for urgent questions. Please call the main number to the clinic [phone] and follow the prompts.  For any non-urgent questions, you may also contact your provider using MyChart. We now offer e-Visits for anyone 52 and older to request care online for non-urgent symptoms. For details visit [email or web address].   Also download the MyChart app! Go to the app store, search "MyChart", open the app, select [facility], and log in with your MyChart username and password.

## 2023-05-31 ENCOUNTER — Inpatient Hospital Stay: Payer: Managed Care, Other (non HMO)

## 2023-05-31 ENCOUNTER — Other Ambulatory Visit: Payer: Managed Care, Other (non HMO)

## 2023-05-31 DIAGNOSIS — C679 Malignant neoplasm of bladder, unspecified: Secondary | ICD-10-CM

## 2023-05-31 DIAGNOSIS — Z5111 Encounter for antineoplastic chemotherapy: Secondary | ICD-10-CM

## 2023-05-31 DIAGNOSIS — Z95828 Presence of other vascular implants and grafts: Secondary | ICD-10-CM

## 2023-05-31 LAB — BASIC METABOLIC PANEL - CANCER CENTER ONLY
Anion gap: 7 (ref 5–15)
BUN: 21 mg/dL (ref 8–23)
CO2: 23 mmol/L (ref 22–32)
Calcium: 9 mg/dL (ref 8.9–10.3)
Chloride: 109 mmol/L (ref 98–111)
Creatinine: 1.18 mg/dL — ABNORMAL HIGH (ref 0.44–1.00)
GFR, Estimated: 52 mL/min — ABNORMAL LOW (ref >60)
Glucose, Bld: 87 mg/dL (ref 70–99)
Potassium: 3.9 mmol/L (ref 3.5–5.1)
Sodium: 139 mmol/L (ref 135–145)

## 2023-05-31 MED ORDER — SODIUM CHLORIDE 0.9% FLUSH
10.0000 mL | Freq: Once | INTRAVENOUS | Status: AC
  Filled 2023-05-31: qty 10

## 2023-05-31 MED ORDER — HEPARIN SOD (PORK) LOCK FLUSH 100 UNIT/ML IV SOLN
500.0000 [IU] | Freq: Once | INTRAVENOUS | Status: AC
  Filled 2023-05-31: qty 5

## 2023-06-04 ENCOUNTER — Inpatient Hospital Stay: Payer: Managed Care, Other (non HMO)

## 2023-06-04 ENCOUNTER — Inpatient Hospital Stay: Payer: Managed Care, Other (non HMO) | Admitting: Internal Medicine

## 2023-06-04 VITALS — BP 129/64 | HR 68 | Temp 97.0°F | Wt 121.1 lb

## 2023-06-04 DIAGNOSIS — C679 Malignant neoplasm of bladder, unspecified: Secondary | ICD-10-CM

## 2023-06-04 DIAGNOSIS — Z5111 Encounter for antineoplastic chemotherapy: Secondary | ICD-10-CM | POA: Diagnosis not present

## 2023-06-04 DIAGNOSIS — D701 Agranulocytosis secondary to cancer chemotherapy: Secondary | ICD-10-CM | POA: Diagnosis not present

## 2023-06-04 DIAGNOSIS — T451X5A Adverse effect of antineoplastic and immunosuppressive drugs, initial encounter: Secondary | ICD-10-CM | POA: Insufficient documentation

## 2023-06-04 DIAGNOSIS — Z95828 Presence of other vascular implants and grafts: Secondary | ICD-10-CM

## 2023-06-04 LAB — CBC WITH DIFFERENTIAL (CANCER CENTER ONLY)
Abs Immature Granulocytes: 0.03 10*3/uL (ref 0.00–0.07)
Basophils Absolute: 0.1 10*3/uL (ref 0.0–0.1)
Basophils Relative: 2 %
Eosinophils Absolute: 0 10*3/uL (ref 0.0–0.5)
Eosinophils Relative: 1 %
HCT: 33.2 % — ABNORMAL LOW (ref 36.0–46.0)
Hemoglobin: 11 g/dL — ABNORMAL LOW (ref 12.0–15.0)
Immature Granulocytes: 1 %
Lymphocytes Relative: 60 %
Lymphs Abs: 2.3 10*3/uL (ref 0.7–4.0)
MCH: 31.7 pg (ref 26.0–34.0)
MCHC: 33.1 g/dL (ref 30.0–36.0)
MCV: 95.7 fL (ref 80.0–100.0)
Monocytes Absolute: 0.3 10*3/uL (ref 0.1–1.0)
Monocytes Relative: 7 %
Neutro Abs: 1.1 10*3/uL — ABNORMAL LOW (ref 1.7–7.7)
Neutrophils Relative %: 29 %
Platelet Count: 452 10*3/uL — ABNORMAL HIGH (ref 150–400)
RBC: 3.47 MIL/uL — ABNORMAL LOW (ref 3.87–5.11)
RDW: 13 % (ref 11.5–15.5)
WBC Count: 3.7 10*3/uL — ABNORMAL LOW (ref 4.0–10.5)
nRBC: 0 % (ref 0.0–0.2)

## 2023-06-04 LAB — CMP (CANCER CENTER ONLY)
ALT: 19 U/L (ref 0–44)
AST: 17 U/L (ref 15–41)
Albumin: 3.7 g/dL (ref 3.5–5.0)
Alkaline Phosphatase: 60 U/L (ref 38–126)
Anion gap: 6 (ref 5–15)
BUN: 22 mg/dL (ref 8–23)
CO2: 26 mmol/L (ref 22–32)
Calcium: 8.6 mg/dL — ABNORMAL LOW (ref 8.9–10.3)
Chloride: 104 mmol/L (ref 98–111)
Creatinine: 0.97 mg/dL (ref 0.44–1.00)
GFR, Estimated: >60 mL/min (ref >60)
Glucose, Bld: 96 mg/dL (ref 70–99)
Potassium: 4.2 mmol/L (ref 3.5–5.1)
Sodium: 136 mmol/L (ref 135–145)
Total Bilirubin: 0.7 mg/dL (ref 0.3–1.2)
Total Protein: 6.9 g/dL (ref 6.5–8.1)

## 2023-06-04 LAB — MAGNESIUM: Magnesium: 2.1 mg/dL (ref 1.7–2.4)

## 2023-06-04 MED ORDER — SODIUM CHLORIDE 0.9% FLUSH
10.0000 mL | Freq: Once | INTRAVENOUS | Status: AC
  Filled 2023-06-04: qty 10

## 2023-06-04 MED ORDER — HEPARIN SOD (PORK) LOCK FLUSH 100 UNIT/ML IV SOLN
500.0000 [IU] | Freq: Once | INTRAVENOUS | Status: AC
  Filled 2023-06-04: qty 5

## 2023-06-04 MED FILL — Dexamethasone Sodium Phosphate Inj 100 MG/10ML: INTRAMUSCULAR | Qty: 1 | Status: AC

## 2023-06-04 NOTE — Progress Notes (Signed)
[facility] CONSULT NOTE  Patient Care Team: McDonough, Renard Matter as PCP - General (Physician Assistant) Michaelyn Barter, MD as Consulting Physician (Oncology)  REFERRING PROVIDER: Dr. Apolinar Junes  REASON FOR REFFERAL: Muscle invasive bladder cancer  CANCER STAGING   Cancer Staging  Urothelial cancer [facility]) Staging form: Kidney, AJCC 8th Edition - Clinical: Stage III (cT3, cN0, cM0) - Signed by Michaelyn Barter, MD on 05/07/2023 Stage prefix: Initial diagnosis   ASSESSMENT & PLAN:  Barbara [name] 62 y.o. female with pmh of anxiety, GERD, right carotid artery stenosis was referred to medical oncology for new diagnosis of muscle invasive urothelial cancer.  # Muscle invasive urothelial carcinoma, cT3N0M0 # Moderate left hydroureteronephrosis s/p left ureteral stent - Patient developed urinary symptoms around September or October 2023.  Was treated with multiple rounds of antibiotics with no improvement.  She was then referred to Dr. Apolinar Junes for further evaluation.  - CTU from 03/22/2023 showed moderate left hydroureteronephrosis to the UVJ, marked leftward asymmetric wall thickening of the urinary bladder measuring 18 mm in maximal thickness which extends over the UVJ.  Perivesical stranding soft tissue.  No evidence of upper tract or abdominopelvic metastatic disease.  - s/p TURBT with left ureteral stent placement by Dr. Apolinar Junes.  IntraOp findings showed large irregular mucosal submucosal extensive tumor from the left dome, left lateral bladder wall majority of trigone including the left UO and bladder neck.  Measures at least 5 x 5 cm. Pathology showed invasive high-grade urothelial carcinoma.  There is some discrepancy.  Lower down in the report mentions low-grade.  Invades muscularis propria.  -CT chest without contrast did not show any evidence of metastatic disease.  Showed mild COPD changes, there is aortic atherosclerosis as well as atherosclerosis of great vessels of  mediastinum and coronary arteries including calcified atherosclerotic plaque in the left main, left anterior descending and left circumflex coronary artery.  Calcifications of the aortic valve.  -Labs reviewed.  CBC showed WBC 9.8, hemoglobin 10.1, platelet 390.  Her creatinine and creatinine clearance has been fluctuating.  Last week it was 40 so we had decreased the dose to 25 mg/m.  Today her creatinine is 0.9 with creatinine clearance of 52.  Will proceed with cisplatin 35 mg/m.  ANC 1.1 noted.  She is scheduled for Neulasta injection on 8/15.  She will come back next week for labs and fluids for close monitoring of kidney function.  # Chemotherapy induced anemia -Hemoglobin 11.  Continue monitoring.  # Chemo induced nausea -Continue with dexamethasone for 3 days post chemo.  Continue with Zofran and also advised the patient to add Compazine as needed  # Constipation-does not like MiraLAX.  Sent prescription for Colace and senna  # Acid reflux/gas/bloating-advised to take OTC simethicone, Tums, Pepcid as needed.  # Coronary atherosclerosis -Detected as incidental finding on CT done for staging purposes. Her last lipid panel showed LDL of 138.  Advised to discuss with primary about role of statin.  Rest per primary.  # Mild COPD changes -On CT's chest.  Prior history of smoking.  Asymptomatic.  # Access-port placed on 05/03/2023  Orders Placed This Encounter  Procedures   CBC with Differential (Cancer Center Only)    Standing Status:   Future    Standing Expiration Date:   06/19/2024   CMP (Cancer Center only)    Standing Status:   Future    Standing Expiration Date:   06/19/2024   Magnesium    Standing Status:  Future    Standing Expiration Date:   06/19/2024   CBC with Differential (Cancer Center Only)    Standing Status:   Future    Standing Expiration Date:   06/26/2024   CMP (Cancer Center only)    Standing Status:   Future    Standing Expiration Date:   06/26/2024    Magnesium    Standing Status:   Future    Standing Expiration Date:   06/26/2024   RTC in 1 week for labs and fluids only RTC in 2 weeks for MD visit, labs, cycle 3-day 1 of cisplatin and gemcitabine  The total time spent in the appointment was 30 minutes encounter with patients including review of chart and various tests results, discussions about plan of care and coordination of care plan   All questions were answered. The patient knows to call the clinic with any problems, questions or concerns. No barriers to learning was detected.  Michaelyn Barter, MD 8/13/202412:53 PM   HISTORY OF PRESENTING ILLNESS:  Barbara [name] 62 y.o. female with pmh of pmh of anxiety, GERD, right carotid artery stenosis was referred to medical oncology for new diagnosis of muscle invasive urothelial cancer.  Patient develops urinary symptoms around September or October 2023.  Was treated with multiple rounds of antibiotics with no improvement.  She was then referred to Dr. Apolinar Junes for further evaluation.  CTU from 03/22/2023 showed moderate left hydroureteronephrosis to the UVJ, marked leftward asymmetric wall thickening of the urinary bladder measuring 18 mm in maximal thickness which extends over the UVJ.  Perivesical stranding soft tissue.  No evidence of upper tract or abdominopelvic metastatic disease.  s/p TURBT with left ureteral stent placement by Dr. Apolinar Junes.  IntraOp findings showed large irregular mucosal submucosal extensive tumor from the left dome, left lateral bladder wall majority of trigone including the left UO and bladder neck.  Measures at least 5 x 5 cm.  Pathology showed invasive high-grade urothelial carcinoma.  There is some discrepancy.  Lower down in the report mentions low-grade.  Invades muscularis propria.  Interval history Patient was seen today prior to cycle 2 day 8 of cisplatin and gemcitabine. Overall she is tolerating treatment well.  Does have nausea for the initial 3 to 4  days of chemo which is manageable with medications.  This time she had little over a day of abdominal spasms associated with mucousy stools.  Denies any peripheral neuropathy, tinnitus or changes in hearing.  Is noticing significant hair loss.  I have reviewed her chart and materials related to her cancer extensively and collaborated history with the patient. Summary of oncologic history is as follows: Oncology History  Urothelial cancer (HCC)  04/12/2023 Initial Diagnosis   Urothelial cancer (HCC)   04/12/2023 Cancer Staging   Staging form: Kidney, AJCC 8th Edition - Clinical: Stage III (cT3, cN0, cM0) - Signed by Michaelyn Barter, MD on 05/07/2023 Stage prefix: Initial diagnosis   Malignant neoplasm of urinary bladder (HCC)  04/22/2023 Initial Diagnosis   Malignant neoplasm of urinary bladder (HCC)   05/07/2023 -  Chemotherapy   Patient is on Treatment Plan : BLADDER Gemcitabine D1,8 + Cisplatin (split dose) D1,8 q21d x 4 cycles       MEDICAL HISTORY:  Past Medical History:  Diagnosis Date   Adopted    Allergic rhinitis    Anemia    h/o   Anxiety    Arthritis    B12 deficiency    Bilateral carotid artery stenosis  Bilateral carotid bruits    Bladder tumor    Depression    Gallbladder polyp    GERD (gastroesophageal reflux disease)    Hydronephrosis of left kidney    Migraines    Tobacco abuse    UTI (urinary tract infection)     SURGICAL HISTORY: Past Surgical History:  Procedure Laterality Date   ABDOMINAL HYSTERECTOMY     BLADDER SURGERY     BREAST BIOPSY Left 2014   neg   CYSTOSCOPY W/ URETERAL STENT PLACEMENT Left 04/04/2023   Procedure: CYSTOSCOPY WITH RETROGRADE PYELOGRAM/URETERAL STENT PLACEMENT;  Surgeon: Vanna Scotland, MD;  Location: [facility] ORS;  Service: Urology;  Laterality: Left;   FOOT SURGERY Right    2009   IR IMAGING GUIDED PORT INSERTION  05/03/2023   LAPAROSCOPIC APPENDECTOMY  12/05/2012   NASAL SEPTUM SURGERY     SISTRUNK PROCEDURE  03/03/2012    Thyroglossal duct cyst excision   TONSILLECTOMY     TRANSURETHRAL RESECTION OF BLADDER TUMOR N/A 04/04/2023   Procedure: TRANSURETHRAL RESECTION OF BLADDER TUMOR (TURBT);  Surgeon: Vanna Scotland, MD;  Location: [facility] ORS;  Service: Urology;  Laterality: N/A;    SOCIAL HISTORY: Social History   Socioeconomic History   Marital status: Married    Spouse name: Not on file   Number of children: Not on file   Years of education: Not on file   Highest education level: Not on file  Occupational History   Not on file  Tobacco Use   Smoking status: Former    Current packs/day: 0.25    Average packs/day: 0.3 packs/day for 30.0 years (7.5 ttl pk-yrs)    Types: Cigarettes    Passive exposure: Past   Smokeless tobacco: Never  Vaping Use   Vaping status: Never Used  Substance and Sexual Activity   Alcohol use: Never   Drug use: Never   Sexual activity: Not on file  Other Topics Concern   Not on file  Social History Narrative   Not on file   Social Determinants of Health   Financial Resource Strain: Not on file  Food Insecurity: No Food Insecurity (04/12/2023)   Hunger Vital Sign    Worried About Running Out of Food in the Last Year: Never true    Ran Out of Food in the Last Year: Never true  Transportation Needs: No Transportation Needs (04/12/2023)   PRAPARE - Administrator, Civil Service (Medical): No    Lack of Transportation (Non-Medical): No  Physical Activity: Not on file  Stress: Not on file  Social Connections: Not on file  Intimate Partner Violence: Not At Risk (04/12/2023)   Humiliation, Afraid, Rape, and Kick questionnaire    Fear of Current or Ex-Partner: No    Emotionally Abused: No    Physically Abused: No    Sexually Abused: No    FAMILY HISTORY: Family History  Adopted: Yes    ALLERGIES:  is allergic to pseudoephedrine hcl.  MEDICATIONS:  Current Outpatient Medications  Medication Sig Dispense Refill   dexamethasone (DECADRON) 4 MG  tablet Take 2 tablets (8 mg total) by mouth daily. For 3 days starting the day after cisplatin chemotherapy.  Take with food. 60 tablet 0   docusate sodium (COLACE) 100 MG capsule Take 1 capsule (100 mg total) by mouth 2 (two) times daily. 60 capsule 2   estradiol (ESTRACE) 1 MG tablet TAKE 1 TABLET BY MOUTH DAILY 90 tablet 3   HYDROcodone-acetaminophen (NORCO/VICODIN) 5-325 MG tablet Take 1 tablet by  mouth every 6 (six) hours as needed for moderate pain. 20 tablet 0   lidocaine-prilocaine (EMLA) cream Apply 1 Application topically as needed. 30 g 0   ondansetron (ZOFRAN) 8 MG tablet Take 1 tablet (8 mg total) by mouth every 8 (eight) hours as needed for up to 30 doses for nausea or vomiting. 30 tablet 1   oxybutynin (DITROPAN) 5 MG tablet Take 1 tablet (5 mg total) by mouth every 8 (eight) hours as needed for bladder spasms. 30 tablet 0   prochlorperazine (COMPAZINE) 10 MG tablet Take 1 tablet (10 mg total) by mouth every 6 (six) hours as needed for nausea or vomiting. 30 tablet 0   senna (SENOKOT) 8.6 MG TABS tablet Take 2 tablets (17.2 mg total) by mouth daily as needed for mild constipation. 60 tablet 2   topiramate (TOPAMAX) 50 MG tablet Take 2 tablets (100 mg total) by mouth at bedtime. 180 tablet 3   trimethoprim (TRIMPEX) 100 MG tablet Take 1 tablet (100 mg total) by mouth daily. 30 tablet 11   celecoxib (CELEBREX) 200 MG capsule TAKE 1 CAPSULE BY MOUTH DAILY (Patient not taking: Reported on 05/28/2023) 90 capsule 3   ibuprofen (ADVIL) 200 MG tablet Take 800 mg by mouth 2 (two) times daily. (Patient not taking: Reported on 05/07/2023)     potassium chloride SA (KLOR-CON M) 20 MEQ tablet Take 1 tablet (20 mEq total) by mouth daily for 2 days. 2 tablet 0   No current facility-administered medications for this visit.   Facility-Administered Medications Ordered in Other Visits  Medication Dose Route Frequency Provider Last Rate Last Admin   heparin lock flush 100 unit/mL  500 Units Intravenous  Once Michaelyn Barter, MD       heparin lock flush 100 unit/mL  500 Units Intravenous Once Michaelyn Barter, MD       sodium chloride flush (NS) 0.9 % injection 10 mL  10 mL Intravenous Once Michaelyn Barter, MD       sodium chloride flush (NS) 0.9 % injection 10 mL  10 mL Intravenous Once Michaelyn Barter, MD        REVIEW OF SYSTEMS:   Pertinent information mentioned in HPI All other systems were reviewed with the patient and are negative.  PHYSICAL EXAMINATION: ECOG PERFORMANCE STATUS: 1 - Symptomatic but completely ambulatory  Vitals:   06/04/23 1005  BP: 129/64  Pulse: 68  Temp: (!) 97 F (36.1 C)  SpO2: 100%    Filed Weights   06/04/23 1005  Weight: 121 lb 1.6 oz (54.9 kg)     GENERAL:alert, no distress and comfortable SKIN: skin color, texture, turgor are normal, no rashes or significant lesions EYES: normal, conjunctiva are pink and non-injected, sclera clear OROPHARYNX:no exudate, no erythema and lips, buccal mucosa, and tongue normal  NECK: supple, thyroid normal size, non-tender, without nodularity LYMPH:  no palpable lymphadenopathy in the cervical, axillary or inguinal LUNGS: clear to auscultation and percussion with normal breathing effort HEART: regular rate & rhythm and no murmurs and no lower extremity edema ABDOMEN:abdomen soft, non-tender and normal bowel sounds Musculoskeletal:no cyanosis of digits and no clubbing  PSYCH: alert & oriented x 3 with fluent speech NEURO: no focal motor/sensory deficits  LABORATORY DATA:  I have reviewed the data as listed Lab Results  Component Value Date   WBC 3.7 (L) 06/04/2023   HGB 11.0 (L) 06/04/2023   HCT 33.2 (L) 06/04/2023   MCV 95.7 06/04/2023   PLT 452 (H) 06/04/2023   Recent Labs  05/14/23 0844 05/28/23 0947 05/31/23 1411 06/04/23 0949  NA 136 137 139 136  K 3.5 4.0 3.9 4.2  CL 105 107 109 104  CO2 24 23 23 26   GLUCOSE 153* 131* 87 96  BUN 27* 18 21 22   CREATININE 1.05* 1.27* 1.18* 0.97   CALCIUM 8.2* 8.6* 9.0 8.6*  GFRNONAA >60 48* 52* >60  PROT 6.5 6.6  --  6.9  ALBUMIN 3.5 3.7  --  3.7  AST 21 18  --  17  ALT 18 12  --  19  ALKPHOS 46 62  --  60  BILITOT 0.9 0.2*  --  0.7    RADIOGRAPHIC STUDIES: I have personally reviewed the radiological images as listed and agreed with the findings in the report. No results found.

## 2023-06-04 NOTE — Progress Notes (Signed)
Patient says that this past week she had some bad stomach cramps with diarrhea. She is noticing that her hair is starting to fall out. Nausea usually for the first 4 days after.

## 2023-06-05 ENCOUNTER — Telehealth: Payer: Self-pay | Admitting: Internal Medicine

## 2023-06-05 ENCOUNTER — Inpatient Hospital Stay: Payer: Managed Care, Other (non HMO)

## 2023-06-05 VITALS — BP 108/66 | HR 89 | Temp 98.3°F

## 2023-06-05 DIAGNOSIS — C679 Malignant neoplasm of bladder, unspecified: Secondary | ICD-10-CM

## 2023-06-05 DIAGNOSIS — Z5111 Encounter for antineoplastic chemotherapy: Secondary | ICD-10-CM | POA: Diagnosis not present

## 2023-06-05 MED ORDER — HEPARIN SOD (PORK) LOCK FLUSH 100 UNIT/ML IV SOLN
500.0000 [IU] | Freq: Once | INTRAVENOUS | Status: AC | PRN
  Administered 2023-06-05: 500 [IU]
  Filled 2023-06-05: qty 5

## 2023-06-05 MED ORDER — MAGNESIUM SULFATE 2 GM/50ML IV SOLN
2.0000 g | Freq: Once | INTRAVENOUS | Status: AC
  Administered 2023-06-05: 2 g via INTRAVENOUS
  Filled 2023-06-05: qty 50

## 2023-06-05 MED ORDER — SODIUM CHLORIDE 0.9 % IV SOLN
10.0000 mg | Freq: Once | INTRAVENOUS | Status: AC
  Administered 2023-06-05: 10 mg via INTRAVENOUS
  Filled 2023-06-05: qty 10

## 2023-06-05 MED ORDER — SODIUM CHLORIDE 0.9 % IV SOLN
1000.0000 mg/m2 | Freq: Once | INTRAVENOUS | Status: AC
  Administered 2023-06-05: 1596 mg via INTRAVENOUS
  Filled 2023-06-05: qty 41.52

## 2023-06-05 MED ORDER — SODIUM CHLORIDE 0.9 % IV SOLN
150.0000 mg | Freq: Once | INTRAVENOUS | Status: AC
  Administered 2023-06-05: 150 mg via INTRAVENOUS
  Filled 2023-06-05: qty 150

## 2023-06-05 MED ORDER — POTASSIUM CHLORIDE IN NACL 20-0.9 MEQ/L-% IV SOLN
Freq: Once | INTRAVENOUS | Status: AC
  Filled 2023-06-05: qty 1000

## 2023-06-05 MED ORDER — SODIUM CHLORIDE 0.9 % IV SOLN
35.0000 mg/m2 | Freq: Once | INTRAVENOUS | Status: AC
  Administered 2023-06-05: 56 mg via INTRAVENOUS
  Filled 2023-06-05: qty 56

## 2023-06-05 MED ORDER — SODIUM CHLORIDE 0.9 % IV SOLN
Freq: Once | INTRAVENOUS | Status: DC
  Filled 2023-06-05: qty 250

## 2023-06-05 MED ORDER — PALONOSETRON HCL INJECTION 0.25 MG/5ML
0.2500 mg | Freq: Once | INTRAVENOUS | Status: AC
  Administered 2023-06-05: 0.25 mg via INTRAVENOUS
  Filled 2023-06-05: qty 5

## 2023-06-05 MED ORDER — SODIUM CHLORIDE 0.9 % IV SOLN
Freq: Once | INTRAVENOUS | Status: AC
  Filled 2023-06-05: qty 250

## 2023-06-05 NOTE — Patient Instructions (Signed)
[place] [facility]  Discharge Instructions: Thank you for choosing [facility] to provide your oncology and hematology care.  If you have a lab appointment with the Cancer Center, please go directly to the Cancer Center and check in at the registration area.  Wear comfortable clothing and clothing appropriate for easy access to any Portacath or PICC line.   We strive to give you quality time with your provider. You may need to reschedule your appointment if you arrive late (15 or more minutes).  Arriving late affects you and other patients whose appointments are after yours.  Also, if you miss three or more appointments without notifying the office, you may be dismissed from the clinic at the provider's discretion.      For prescription refill requests, have your pharmacy contact our office and allow 72 hours for refills to be completed.    Today you received the following chemotherapy and/or immunotherapy agents Gemzar and Cisplatin       To help prevent nausea and vomiting after your treatment, we encourage you to take your nausea medication as directed.  BELOW ARE SYMPTOMS THAT SHOULD BE REPORTED IMMEDIATELY: *FEVER GREATER THAN 100.4 F (38 C) OR HIGHER *CHILLS OR SWEATING *NAUSEA AND VOMITING THAT IS NOT CONTROLLED WITH YOUR NAUSEA MEDICATION *UNUSUAL SHORTNESS OF BREATH *UNUSUAL BRUISING OR BLEEDING *URINARY PROBLEMS (pain or burning when urinating, or frequent urination) *BOWEL PROBLEMS (unusual diarrhea, constipation, pain near the anus) TENDERNESS IN MOUTH AND THROAT WITH OR WITHOUT PRESENCE OF ULCERS (sore throat, sores in mouth, or a toothache) UNUSUAL RASH, SWELLING OR PAIN  UNUSUAL VAGINAL DISCHARGE OR ITCHING   Items with * indicate a potential emergency and should be followed up as soon as possible or go to the Emergency Department if any problems should occur.  Please show the CHEMOTHERAPY ALERT CARD or IMMUNOTHERAPY ALERT CARD at  check-in to the Emergency Department and triage nurse.  Should you have questions after your visit or need to cancel or reschedule your appointment, please contact [place] [facility]  [phone] and follow the prompts.  Office hours are 8:00 a.m. to 4:30 p.m. Monday - Friday. Please note that voicemails left after 4:00 p.m. may not be returned until the following business day.  We are closed weekends and major holidays. You have access to a nurse at all times for urgent questions. Please call the main number to the clinic [phone] and follow the prompts.  For any non-urgent questions, you may also contact your provider using MyChart. We now offer e-Visits for anyone 31 and older to request care online for non-urgent symptoms. For details visit [email or web address].   Also download the MyChart app! Go to the app store, search "MyChart", open the app, select [place], and log in with your MyChart username and password.

## 2023-06-05 NOTE — Telephone Encounter (Signed)
This patient has an injection request for 9/6- we had to decouple the infusion appointment so that is scheduled for 9/6. Please adjust the injection request .  Thank you

## 2023-06-05 NOTE — Progress Notes (Signed)
 Per Dr. Alena Bills okay to run post hydration fluids with Cisplatin.

## 2023-06-06 ENCOUNTER — Inpatient Hospital Stay: Payer: Managed Care, Other (non HMO)

## 2023-06-06 DIAGNOSIS — C679 Malignant neoplasm of bladder, unspecified: Secondary | ICD-10-CM

## 2023-06-06 DIAGNOSIS — Z5111 Encounter for antineoplastic chemotherapy: Secondary | ICD-10-CM | POA: Diagnosis not present

## 2023-06-06 MED ORDER — PEGFILGRASTIM-CBQV 6 MG/0.6ML ~~LOC~~ SOSY
6.0000 mg | PREFILLED_SYRINGE | Freq: Once | SUBCUTANEOUS | Status: AC
  Administered 2023-06-06: 6 mg via SUBCUTANEOUS
  Filled 2023-06-06: qty 0.6

## 2023-06-09 ENCOUNTER — Other Ambulatory Visit: Payer: Self-pay

## 2023-06-09 ENCOUNTER — Emergency Department
Admission: EM | Admit: 2023-06-09 | Discharge: 2023-06-09 | Disposition: A | Discharge status: Home / Self Care | Payer: Managed Care, Other (non HMO) | Attending: Emergency Medicine | Admitting: Emergency Medicine

## 2023-06-09 ENCOUNTER — Emergency Department: Payer: Managed Care, Other (non HMO)

## 2023-06-09 DIAGNOSIS — D72829 Elevated white blood cell count, unspecified: Secondary | ICD-10-CM | POA: Diagnosis not present

## 2023-06-09 DIAGNOSIS — N3090 Cystitis, unspecified without hematuria: Secondary | ICD-10-CM | POA: Diagnosis not present

## 2023-06-09 DIAGNOSIS — C679 Malignant neoplasm of bladder, unspecified: Secondary | ICD-10-CM | POA: Diagnosis not present

## 2023-06-09 DIAGNOSIS — R1030 Lower abdominal pain, unspecified: Secondary | ICD-10-CM | POA: Diagnosis present

## 2023-06-09 HISTORY — DX: Malignant (primary) neoplasm, unspecified: C80.1

## 2023-06-09 LAB — URINALYSIS, ROUTINE W REFLEX MICROSCOPIC
Bacteria, UA: NONE SEEN
Bilirubin Urine: NEGATIVE
Glucose, UA: NEGATIVE mg/dL
Ketones, ur: 5 mg/dL — AB
Nitrite: NEGATIVE
Protein, ur: NEGATIVE mg/dL
Specific Gravity, Urine: 1.039 — ABNORMAL HIGH (ref 1.005–1.030)
WBC, UA: >50 WBC/hpf (ref 0–5)
pH: 6 (ref 5.0–8.0)

## 2023-06-09 LAB — COMPREHENSIVE METABOLIC PANEL
ALT: 17 U/L (ref 0–44)
AST: 22 U/L (ref 15–41)
Albumin: 3.5 g/dL (ref 3.5–5.0)
Alkaline Phosphatase: 84 U/L (ref 38–126)
Anion gap: 9 (ref 5–15)
BUN: 22 mg/dL (ref 8–23)
CO2: 23 mmol/L (ref 22–32)
Calcium: 8.4 mg/dL — ABNORMAL LOW (ref 8.9–10.3)
Chloride: 103 mmol/L (ref 98–111)
Creatinine, Ser: 0.96 mg/dL (ref 0.44–1.00)
GFR, Estimated: >60 mL/min (ref >60)
Glucose, Bld: 93 mg/dL (ref 70–99)
Potassium: 3.8 mmol/L (ref 3.5–5.1)
Sodium: 135 mmol/L (ref 135–145)
Total Bilirubin: 0.7 mg/dL (ref 0.3–1.2)
Total Protein: 6.2 g/dL — ABNORMAL LOW (ref 6.5–8.1)

## 2023-06-09 LAB — CBC
HCT: 29.9 % — ABNORMAL LOW (ref 36.0–46.0)
Hemoglobin: 9.9 g/dL — ABNORMAL LOW (ref 12.0–15.0)
MCH: 32.1 pg (ref 26.0–34.0)
MCHC: 33.1 g/dL (ref 30.0–36.0)
MCV: 97.1 fL (ref 80.0–100.0)
Platelets: 165 10*3/uL (ref 150–400)
RBC: 3.08 MIL/uL — ABNORMAL LOW (ref 3.87–5.11)
RDW: 14 % (ref 11.5–15.5)
WBC: 55.1 10*3/uL (ref 4.0–10.5)
nRBC: 0 % (ref 0.0–0.2)

## 2023-06-09 LAB — MAGNESIUM: Magnesium: 1.8 mg/dL (ref 1.7–2.4)

## 2023-06-09 LAB — LIPASE, BLOOD: Lipase: 30 U/L (ref 11–51)

## 2023-06-09 LAB — PHOSPHORUS: Phosphorus: 3.1 mg/dL (ref 2.5–4.6)

## 2023-06-09 MED ORDER — OXYCODONE-ACETAMINOPHEN 5-325 MG PO TABS: 1.0000 | ORAL_TABLET | ORAL | 0 refills | Status: AC | PRN

## 2023-06-09 MED ORDER — SULFAMETHOXAZOLE-TRIMETHOPRIM 800-160 MG PO TABS: 1.0000 | ORAL_TABLET | Freq: Two times a day (BID) | ORAL | 0 refills | Status: AC

## 2023-06-09 MED ORDER — LACTATED RINGERS IV BOLUS
1000.0000 mL | Freq: Once | INTRAVENOUS | Status: AC
  Administered 2023-06-09: 1000 mL via INTRAVENOUS

## 2023-06-09 MED ORDER — IOHEXOL 300 MG/ML  SOLN
75.0000 mL | Freq: Once | INTRAMUSCULAR | Status: AC | PRN
  Administered 2023-06-09: 75 mL via INTRAVENOUS

## 2023-06-09 MED ORDER — OXYCODONE-ACETAMINOPHEN 5-325 MG PO TABS
1.0000 | ORAL_TABLET | Freq: Once | ORAL | Status: AC
  Administered 2023-06-09: 1 via ORAL
  Filled 2023-06-09: qty 1

## 2023-06-09 MED ORDER — ONDANSETRON HCL 4 MG/2ML IJ SOLN
4.0000 mg | Freq: Once | INTRAMUSCULAR | Status: AC
  Administered 2023-06-09: 4 mg via INTRAVENOUS
  Filled 2023-06-09: qty 2

## 2023-06-09 MED ORDER — MORPHINE SULFATE (PF) 4 MG/ML IV SOLN
4.0000 mg | Freq: Once | INTRAVENOUS | Status: AC
  Administered 2023-06-09: 4 mg via INTRAVENOUS
  Filled 2023-06-09: qty 1

## 2023-06-09 MED ORDER — HEPARIN SOD (PORK) LOCK FLUSH 100 UNIT/ML IV SOLN
500.0000 [IU] | Freq: Once | INTRAVENOUS | Status: AC
  Administered 2023-06-09: 500 [IU] via INTRAVENOUS
  Filled 2023-06-09: qty 5

## 2023-06-09 NOTE — ED Provider Notes (Signed)
[facility] Provider Note    Event Date/Time   First MD Initiated Contact with Patient 06/09/23 0920     (approximate)   History   Chief Complaint: Abdominal Pain   HPI  Barbara [name] is a 62 y.o. female with a history of migraines, gallbladder tumor s/p resection, GERD, anxiety who comes ED complaining of lower abdominal pain and cramping for the past 4 days, severe.  Associated with mucus in stool.  Had chemotherapy last 4 days ago.  No fever.  No chest pain or shortness of breath.  No bloody stool     Physical Exam   Triage Vital Signs: ED Triage Vitals  Encounter Vitals Group     BP 06/09/23 0925 (!) 140/75     Systolic BP Percentile --      Diastolic BP Percentile --      Pulse Rate 06/09/23 0925 89     Resp 06/09/23 0925 20     Temp 06/09/23 0925 98 F (36.7 C)     Temp Source 06/09/23 0925 Oral     SpO2 06/09/23 0925 96 %     Weight 06/09/23 0926 121 lb 4.1 oz (55 kg)     Height 06/09/23 0926 5\' 5"  (1.651 m)     Head Circumference --      Peak Flow --      Pain Score 06/09/23 0924 5     Pain Loc --      Pain Education --      Exclude from Growth Chart --     Most recent vital signs: Vitals:   06/09/23 1430 06/09/23 1500  BP: 135/80 136/86  Pulse: 81 87  Resp:    Temp:    SpO2: 100% 100%    General: Awake, no distress.  CV:  Good peripheral perfusion.  Regular rate and rhythm Resp:  Normal effort.  Clear to auscultation bilaterally Abd:  No distention.  Soft with pronounced suprapubic and left lower quadrant tenderness Other:  Dry oral mucosa   ED Results / Procedures / Treatments   Labs (all labs ordered are listed, but only abnormal results are displayed) Labs Reviewed  COMPREHENSIVE METABOLIC PANEL - Abnormal; Notable for the following components:      Result Value   Calcium 8.4 (*)    Total Protein 6.2 (*)    All other components within normal limits  CBC - Abnormal; Notable for the following components:    WBC 55.1 (*)    RBC 3.08 (*)    Hemoglobin 9.9 (*)    HCT 29.9 (*)    All other components within normal limits  URINALYSIS, ROUTINE W REFLEX MICROSCOPIC - Abnormal; Notable for the following components:   Color, Urine YELLOW (*)    APPearance HAZY (*)    Specific Gravity, Urine 1.039 (*)    Hgb urine dipstick SMALL (*)    Ketones, ur 5 (*)    Leukocytes,Ua MODERATE (*)    All other components within normal limits  URINE CULTURE  LIPASE, BLOOD  MAGNESIUM  PHOSPHORUS     EKG    RADIOLOGY CT abdomen pelvis interpreted by me, negative for abscess or free air.  Radiology report reviewed   PROCEDURES:  Procedures   MEDICATIONS ORDERED IN ED: Medications  morphine (PF) 4 MG/ML injection 4 mg (4 mg Intravenous Given 06/09/23 0952)  ondansetron (ZOFRAN) injection 4 mg (4 mg Intravenous Given 06/09/23 0951)  iohexol (OMNIPAQUE) 300 MG/ML solution 75 mL (75 mLs  Intravenous Contrast Given 06/09/23 1152)  lactated ringers bolus 1,000 mL (0 mLs Intravenous Stopped 06/09/23 1515)  oxyCODONE-acetaminophen (PERCOCET/ROXICET) 5-325 MG per tablet 1 tablet (1 tablet Oral Given 06/09/23 1357)  heparin lock flush 100 unit/mL (500 Units Intravenous Given 06/09/23 1516)     IMPRESSION / MDM / ASSESSMENT AND PLAN / ED COURSE  I reviewed the triage vital signs and the nursing notes.  DDx: Cystitis, tumor necrosis, diverticulitis, intra-abdominal abscess, bowel perforation  Patient's presentation is most consistent with acute presentation with potential threat to life or bodily function.  Patient presents with pronounced lower abdominal tenderness in the setting of bladder tumor s/p resection and chemotherapy.  Vital signs unremarkable, nontoxic, not septic.  Labs reveal leukocytosis of 55,000, which may be due to hematopoietic medication given for her chronic leukopenia.  Urinalysis consistent with a UTI.  Other serum labs unremarkable.  CT abdomen pelvis unremarkable.  Will treat for cystitis,  which may be chemo-induced versus infectious.  Urine culture ordered.  Symptoms well-controlled with oral Percocet, will prescribe a short course.      FINAL CLINICAL IMPRESSION(S) / ED DIAGNOSES   Final diagnoses:  Cystitis     Rx / DC Orders   ED Discharge Orders          Ordered    sulfamethoxazole-trimethoprim (BACTRIM DS) 800-160 MG tablet  2 times daily        06/09/23 1458    oxyCODONE-acetaminophen (PERCOCET) 5-325 MG tablet  Every 4 hours PRN        06/09/23 1458             Note:  This document was prepared using Dragon voice recognition software and may include unintentional dictation errors.   Sharman Cheek, MD 06/09/23 [phone]

## 2023-06-09 NOTE — ED Notes (Signed)
Patient transported to CT 

## 2023-06-09 NOTE — ED Triage Notes (Signed)
Pt to ED POV, has bladder cancer and last chemo was 4 days ago, has port to R chest. Pt began with lower bilateral severe cramping abdominal pain and mucous in stools about 10 hours ago. Denies blood in stool. States is dry heaving. Takes zofran at home. Has not eaten much since last chemo. Ambulatory to room.

## 2023-06-10 ENCOUNTER — Telehealth: Payer: Self-pay | Admitting: *Deleted

## 2023-06-10 ENCOUNTER — Other Ambulatory Visit: Payer: Self-pay | Admitting: *Deleted

## 2023-06-10 DIAGNOSIS — C679 Malignant neoplasm of bladder, unspecified: Secondary | ICD-10-CM

## 2023-06-10 NOTE — Telephone Encounter (Signed)
Call returned to patient and advised that yes she does need to come tomorrow for IV fluids.She agreed to come

## 2023-06-10 NOTE — Telephone Encounter (Signed)
Patient called reporting that she was in ER over weekend and was told to let us know this. She had mucous diarrhea and abdominal pain. She is also asking if she still needs to come for IV fluids tomorrow since she had IV fluids yesterday. Please advise  IMPRESSION / MDM / ASSESSMENT AND PLAN / ED COURSE  I reviewed the triage vital signs and the nursing notes.   DDx: Cystitis, tumor necrosis, diverticulitis, intra-abdominal abscess, bowel perforation   Patient's presentation is most consistent with acute presentation with potential threat to life or bodily function.   Patient presents with pronounced lower abdominal tenderness in the setting of bladder tumor s/p resection and chemotherapy.  Vital signs unremarkable, nontoxic, not septic.  Labs reveal leukocytosis of 55,000, which may be due to hematopoietic medication given for her chronic leukopenia.  Urinalysis consistent with a UTI.  Other serum labs unremarkable.  CT abdomen pelvis unremarkable.  Will treat for cystitis, which may be chemo-induced versus infectious.  Urine culture ordered.  Symptoms well-controlled with oral Percocet, will prescribe a short course.       FINAL CLINICAL IMPRESSION(S) / ED DIAGNOSES    Final diagnoses:  Cystitis        Rx / DC Orders    ED Discharge Orders             Ordered      sulfamethoxazole-trimethoprim (BACTRIM DS) 800-160 MG tablet  2 times daily        06/09/23 1458      oxyCODONE-acetaminophen (PERCOCET) 5-325 MG tablet  Every 4 hours PRN        06/09/23 1458

## 2023-06-11 ENCOUNTER — Inpatient Hospital Stay: Payer: Managed Care, Other (non HMO)

## 2023-06-11 VITALS — BP 113/71 | HR 81 | Temp 95.5°F | Resp 18

## 2023-06-11 DIAGNOSIS — Z95828 Presence of other vascular implants and grafts: Secondary | ICD-10-CM

## 2023-06-11 DIAGNOSIS — C679 Malignant neoplasm of bladder, unspecified: Secondary | ICD-10-CM

## 2023-06-11 DIAGNOSIS — Z5111 Encounter for antineoplastic chemotherapy: Secondary | ICD-10-CM | POA: Diagnosis not present

## 2023-06-11 LAB — URINE CULTURE: Culture: >=100000 — AB

## 2023-06-11 LAB — BASIC METABOLIC PANEL - CANCER CENTER ONLY
Anion gap: 10 (ref 5–15)
BUN: 15 mg/dL (ref 8–23)
CO2: 24 mmol/L (ref 22–32)
Calcium: 8.5 mg/dL — ABNORMAL LOW (ref 8.9–10.3)
Chloride: 99 mmol/L (ref 98–111)
Creatinine: 1.05 mg/dL — ABNORMAL HIGH (ref 0.44–1.00)
GFR, Estimated: >60 mL/min (ref >60)
Glucose, Bld: 151 mg/dL — ABNORMAL HIGH (ref 70–99)
Potassium: 3.4 mmol/L — ABNORMAL LOW (ref 3.5–5.1)
Sodium: 133 mmol/L — ABNORMAL LOW (ref 135–145)

## 2023-06-11 MED ORDER — SODIUM CHLORIDE 0.9% FLUSH
10.0000 mL | Freq: Once | INTRAVENOUS | Status: AC
  Administered 2023-06-11: 10 mL via INTRAVENOUS
  Filled 2023-06-11: qty 10

## 2023-06-11 MED ORDER — SODIUM CHLORIDE 0.9 % IV SOLN
INTRAVENOUS | Status: DC | PRN
  Filled 2023-06-11: qty 250

## 2023-06-11 MED ORDER — HEPARIN SOD (PORK) LOCK FLUSH 100 UNIT/ML IV SOLN
500.0000 [IU] | Freq: Once | INTRAVENOUS | Status: AC
  Administered 2023-06-11: 500 [IU]
  Filled 2023-06-11: qty 5

## 2023-06-11 NOTE — Progress Notes (Signed)
Patient has not yet started her bactrim, but I encouraged her to do so. Potassium level is 3.4 today, but pt stated that she would increase her diet in foods with potassium-potatoes, avocados

## 2023-06-17 ENCOUNTER — Inpatient Hospital Stay: Payer: Managed Care, Other (non HMO) | Admitting: Internal Medicine

## 2023-06-17 ENCOUNTER — Encounter: Payer: Self-pay | Admitting: Internal Medicine

## 2023-06-17 ENCOUNTER — Inpatient Hospital Stay: Payer: Managed Care, Other (non HMO)

## 2023-06-17 VITALS — BP 128/66 | HR 75 | Temp 98.9°F | Wt 120.0 lb

## 2023-06-17 DIAGNOSIS — C679 Malignant neoplasm of bladder, unspecified: Secondary | ICD-10-CM

## 2023-06-17 DIAGNOSIS — Z95828 Presence of other vascular implants and grafts: Secondary | ICD-10-CM

## 2023-06-17 DIAGNOSIS — R109 Unspecified abdominal pain: Secondary | ICD-10-CM

## 2023-06-17 DIAGNOSIS — Z5111 Encounter for antineoplastic chemotherapy: Secondary | ICD-10-CM | POA: Diagnosis not present

## 2023-06-17 LAB — CBC WITH DIFFERENTIAL (CANCER CENTER ONLY)
Abs Immature Granulocytes: 1.25 10*3/uL — ABNORMAL HIGH (ref 0.00–0.07)
Basophils Absolute: 0.1 10*3/uL (ref 0.0–0.1)
Basophils Relative: 0 %
Eosinophils Absolute: 0.1 10*3/uL (ref 0.0–0.5)
Eosinophils Relative: 1 %
HCT: 32.1 % — ABNORMAL LOW (ref 36.0–46.0)
Hemoglobin: 10.3 g/dL — ABNORMAL LOW (ref 12.0–15.0)
Immature Granulocytes: 9 %
Lymphocytes Relative: 19 %
Lymphs Abs: 2.6 10*3/uL (ref 0.7–4.0)
MCH: 32 pg (ref 26.0–34.0)
MCHC: 32.1 g/dL (ref 30.0–36.0)
MCV: 99.7 fL (ref 80.0–100.0)
Monocytes Absolute: 1 10*3/uL (ref 0.1–1.0)
Monocytes Relative: 7 %
Neutro Abs: 8.7 10*3/uL — ABNORMAL HIGH (ref 1.7–7.7)
Neutrophils Relative %: 64 %
Platelet Count: 232 10*3/uL (ref 150–400)
RBC: 3.22 MIL/uL — ABNORMAL LOW (ref 3.87–5.11)
RDW: 15.7 % — ABNORMAL HIGH (ref 11.5–15.5)
Smear Review: NORMAL
WBC Count: 13.7 10*3/uL — ABNORMAL HIGH (ref 4.0–10.5)
nRBC: 0 % (ref 0.0–0.2)

## 2023-06-17 LAB — CMP (CANCER CENTER ONLY)
ALT: 13 U/L (ref 0–44)
AST: 18 U/L (ref 15–41)
Albumin: 4.1 g/dL (ref 3.5–5.0)
Alkaline Phosphatase: 93 U/L (ref 38–126)
Anion gap: 7 (ref 5–15)
BUN: 24 mg/dL — ABNORMAL HIGH (ref 8–23)
CO2: 26 mmol/L (ref 22–32)
Calcium: 8.7 mg/dL — ABNORMAL LOW (ref 8.9–10.3)
Chloride: 100 mmol/L (ref 98–111)
Creatinine: 1.36 mg/dL — ABNORMAL HIGH (ref 0.44–1.00)
GFR, Estimated: 44 mL/min — ABNORMAL LOW (ref >60)
Glucose, Bld: 96 mg/dL (ref 70–99)
Potassium: 3.7 mmol/L (ref 3.5–5.1)
Sodium: 133 mmol/L — ABNORMAL LOW (ref 135–145)
Total Bilirubin: 0.2 mg/dL — ABNORMAL LOW (ref 0.3–1.2)
Total Protein: 7 g/dL (ref 6.5–8.1)

## 2023-06-17 LAB — MAGNESIUM: Magnesium: 2 mg/dL (ref 1.7–2.4)

## 2023-06-17 MED ORDER — CIPROFLOXACIN HCL 250 MG PO TABS: 250.0000 mg | ORAL_TABLET | Freq: Two times a day (BID) | ORAL | 0 refills | Status: AC

## 2023-06-17 MED ORDER — SODIUM CHLORIDE 0.9% FLUSH
10.0000 mL | Freq: Once | INTRAVENOUS | Status: AC
  Filled 2023-06-17: qty 10

## 2023-06-17 MED ORDER — DICYCLOMINE HCL 10 MG PO CAPS: 10.0000 mg | ORAL_CAPSULE | Freq: Three times a day (TID) | ORAL | 0 refills | Status: DC | PRN

## 2023-06-17 MED ORDER — HEPARIN SOD (PORK) LOCK FLUSH 100 UNIT/ML IV SOLN
500.0000 [IU] | Freq: Once | INTRAVENOUS | Status: AC
  Filled 2023-06-17: qty 5

## 2023-06-17 NOTE — Progress Notes (Unsigned)
Patient is still having stomach cramps that has worsened and now no stool just mucus. Nausea is worse and lasting longer than usual.

## 2023-06-18 ENCOUNTER — Inpatient Hospital Stay: Payer: Managed Care, Other (non HMO)

## 2023-06-18 ENCOUNTER — Encounter: Payer: Self-pay | Admitting: Nurse Practitioner

## 2023-06-18 ENCOUNTER — Encounter: Payer: Self-pay | Admitting: Internal Medicine

## 2023-06-18 VITALS — BP 115/69 | HR 76 | Temp 96.5°F | Resp 20

## 2023-06-18 DIAGNOSIS — R109 Unspecified abdominal pain: Secondary | ICD-10-CM | POA: Insufficient documentation

## 2023-06-18 DIAGNOSIS — C679 Malignant neoplasm of bladder, unspecified: Secondary | ICD-10-CM

## 2023-06-18 DIAGNOSIS — Z5111 Encounter for antineoplastic chemotherapy: Secondary | ICD-10-CM | POA: Diagnosis not present

## 2023-06-18 MED ORDER — SODIUM CHLORIDE 0.9% FLUSH
10.0000 mL | Freq: Once | INTRAVENOUS | Status: AC | PRN
  Administered 2023-06-18: 10 mL
  Filled 2023-06-18: qty 10

## 2023-06-18 MED ORDER — SODIUM CHLORIDE 0.9 % IV SOLN
INTRAVENOUS | Status: DC | PRN
  Filled 2023-06-18: qty 250

## 2023-06-18 MED ORDER — HEPARIN SOD (PORK) LOCK FLUSH 100 UNIT/ML IV SOLN
500.0000 [IU] | Freq: Once | INTRAVENOUS | Status: AC | PRN
  Administered 2023-06-18: 500 [IU]
  Filled 2023-06-18: qty 5

## 2023-06-18 NOTE — Progress Notes (Signed)
[facility] CONSULT NOTE  Patient Care Team: McDonough, Renard Matter as PCP - General (Physician Assistant) Michaelyn Barter, MD as Consulting Physician (Oncology)  REFERRING PROVIDER: Dr. Apolinar Junes  REASON FOR REFFERAL: Muscle invasive bladder cancer  CANCER STAGING   Cancer Staging  Urothelial cancer [facility]) Staging form: Kidney, AJCC 8th Edition - Clinical: Stage III (cT3, cN0, cM0) - Signed by Michaelyn Barter, MD on 05/07/2023 Stage prefix: Initial diagnosis   ASSESSMENT & PLAN:  Barbara [name] 62 y.o. female with pmh of anxiety, GERD, right carotid artery stenosis was referred to medical oncology for new diagnosis of muscle invasive urothelial cancer.  # Muscle invasive urothelial carcinoma, cT3N0M0 # Moderate left hydroureteronephrosis s/p left ureteral stent - Patient developed urinary symptoms around September or October 2023.  Was treated with multiple rounds of antibiotics with no improvement.  She was then referred to Dr. Apolinar Junes for further evaluation.  - CTU from 03/22/2023 showed moderate left hydroureteronephrosis to the UVJ, marked leftward asymmetric wall thickening of the urinary bladder measuring 18 mm in maximal thickness which extends over the UVJ.  Perivesical stranding soft tissue.  No evidence of upper tract or abdominopelvic metastatic disease.  - s/p TURBT with left ureteral stent placement by Dr. Apolinar Junes.  IntraOp findings showed large irregular mucosal submucosal extensive tumor from the left dome, left lateral bladder wall majority of trigone including the left UO and bladder neck.  Measures at least 5 x 5 cm. Pathology showed invasive high-grade urothelial carcinoma.  There is some discrepancy.  Lower down in the report mentions low-grade.  Invades muscularis propria.  -CT chest without contrast did not show any evidence of metastatic disease.  Showed mild COPD changes, there is aortic atherosclerosis as well as atherosclerosis of great vessels of  mediastinum and coronary arteries including calcified atherosclerotic plaque in the left main, left anterior descending and left circumflex coronary artery.  Calcifications of the aortic valve.  -Patient was seen today prior to cycle 3-day 1 of split dose cisplatin and gemcitabine.  Creatinine has worsened to 1.3.  Creatinine clearance of 36.8.  Patient presented to emergency room on 06/09/2023 with abdominal cramps and mucus.  At that time creatinine was 0.9.  CT abdomen did not show any acute findings.  Urine culture showed more than 100 K colony of Staph epidermidis.  She was discharged on Bactrim.  Creatinine likely worsened from contrast exposure and Bactrim.  I have advised her to stop Bactrim and switch to Cipro for 3 more days.  Hold chemotherapy this week.  IV fluids.  I will follow-up with her in 1 week with repeat labs.  # Abdominal cramp # Mucus discharge -Patient presented to ED on 06/09/2023.  CT abdomen pelvis did not show any acute changes. -Urine culture was positive for Staph epidermidis and was discharged on Bactrim. -Her symptoms start with the chemotherapy treatment and may be related to it.  I have sent prescription for Bentyl 10 mg Q8 as needed for antispasmodic. -Taking Percocet 2.5-325 every 6 as needed -Discussed about GI referral and patient would like to hold off.  # Chemotherapy induced anemia -Hemoglobin 10.3.  Continue to monitor.  # Chemo induced nausea -Continue with dexamethasone for 3 days post chemo.  Continue with Zofran and also advised the patient to add Compazine as needed  # Constipation-does not like MiraLAX.  Sent prescription for Colace and senna  # Acid reflux/gas/bloating-advised to take OTC simethicone, Tums, Pepcid as needed.  # Coronary atherosclerosis -Detected as  incidental finding on CT done for staging purposes. Her last lipid panel showed LDL of 138.  Advised to discuss with primary about role of statin.  Rest per primary.  # Mild COPD  changes -On CT's chest.  Prior history of smoking.  Asymptomatic.  # Access-port placed on 05/03/2023  Orders Placed This Encounter  Procedures   CBC with Differential (Cancer Center Only)    Standing Status:   Future    Standing Expiration Date:   06/24/2024   CMP (Cancer Center only)    Standing Status:   Future    Standing Expiration Date:   06/24/2024   Magnesium    Standing Status:   Future    Standing Expiration Date:   06/24/2024   RTC in 1 week for MD visit, labs, cycle 3-day 1 of cisplatin and gemcitabine RTC in 2-week for MD visit, labs, cycle 3-day 8  The total time spent in the appointment was 30 minutes encounter with patients including review of chart and various tests results, discussions about plan of care and coordination of care plan   All questions were answered. The patient knows to call the clinic with any problems, questions or concerns. No barriers to learning was detected.  Michaelyn Barter, MD 8/27/202412:43 PM   HISTORY OF PRESENTING ILLNESS:  Barbara [name] 62 y.o. female with pmh of pmh of anxiety, GERD, right carotid artery stenosis was referred to medical oncology for new diagnosis of muscle invasive urothelial cancer.  Patient develops urinary symptoms around September or October 2023.  Was treated with multiple rounds of antibiotics with no improvement.  She was then referred to Dr. Apolinar Junes for further evaluation.  CTU from 03/22/2023 showed moderate left hydroureteronephrosis to the UVJ, marked leftward asymmetric wall thickening of the urinary bladder measuring 18 mm in maximal thickness which extends over the UVJ.  Perivesical stranding soft tissue.  No evidence of upper tract or abdominopelvic metastatic disease.  s/p TURBT with left ureteral stent placement by Dr. Apolinar Junes.  IntraOp findings showed large irregular mucosal submucosal extensive tumor from the left dome, left lateral bladder wall majority of trigone including the left UO and bladder neck.   Measures at least 5 x 5 cm.  Pathology showed invasive high-grade urothelial carcinoma.  There is some discrepancy.  Lower down in the report mentions low-grade.  Invades muscularis propria.  Interval history Patient was seen today prior to cycle 3 day 1 of cisplatin and gemcitabine. Patient has been having issues with abdominal cramps and rectal mucus discharge.  She presented to ED.  Had CT abdomen which did not show any acute finding.  Her urine culture was positive for Staph epidermidis and she was discharged on antibiotics.  Her abdominal cramps have significantly improved since but continues to have mucus discharge.  Her eating has improved now that her pain has gotten better.  I have reviewed her chart and materials related to her cancer extensively and collaborated history with the patient. Summary of oncologic history is as follows: Oncology History  Urothelial cancer (HCC)  04/12/2023 Initial Diagnosis   Urothelial cancer (HCC)   04/12/2023 Cancer Staging   Staging form: Kidney, AJCC 8th Edition - Clinical: Stage III (cT3, cN0, cM0) - Signed by Michaelyn Barter, MD on 05/07/2023 Stage prefix: Initial diagnosis   Malignant neoplasm of urinary bladder (HCC)  04/22/2023 Initial Diagnosis   Malignant neoplasm of urinary bladder (HCC)   05/07/2023 -  Chemotherapy   Patient is on Treatment Plan : BLADDER Gemcitabine D1,8 +  Cisplatin (split dose) D1,8 q21d x 4 cycles       MEDICAL HISTORY:  Past Medical History:  Diagnosis Date   Adopted    Allergic rhinitis    Anemia    h/o   Anxiety    Arthritis    B12 deficiency    Bilateral carotid artery stenosis    Bilateral carotid bruits    Bladder tumor    Cancer (HCC)    bladder cancer   Depression    Gallbladder polyp    GERD (gastroesophageal reflux disease)    Hydronephrosis of left kidney    Migraines    Tobacco abuse    UTI (urinary tract infection)     SURGICAL HISTORY: Past Surgical History:  Procedure Laterality  Date   ABDOMINAL HYSTERECTOMY     BLADDER SURGERY     BREAST BIOPSY Left 2014   neg   CYSTOSCOPY W/ URETERAL STENT PLACEMENT Left 04/04/2023   Procedure: CYSTOSCOPY WITH RETROGRADE PYELOGRAM/URETERAL STENT PLACEMENT;  Surgeon: Vanna Scotland, MD;  Location: [facility] ORS;  Service: Urology;  Laterality: Left;   FOOT SURGERY Right    2009   IR IMAGING GUIDED PORT INSERTION  05/03/2023   LAPAROSCOPIC APPENDECTOMY  12/05/2012   NASAL SEPTUM SURGERY     SISTRUNK PROCEDURE  03/03/2012   Thyroglossal duct cyst excision   TONSILLECTOMY     TRANSURETHRAL RESECTION OF BLADDER TUMOR N/A 04/04/2023   Procedure: TRANSURETHRAL RESECTION OF BLADDER TUMOR (TURBT);  Surgeon: Vanna Scotland, MD;  Location: [facility] OR[facility];  Service: Urology;  Laterality: N/A;    SOCIAL HISTORY: Social History   Socioeconomic History   Marital status: Married    Spouse name: Not on file   Number of children: Not on file   Years of education: Not on file   Highest education level: Not on file  Occupational History   Not on file  Tobacco Use   Smoking status: Former    Current packs/day: 0.25    Average packs/day: 0.3 packs/day for 30.0 years (7.5 ttl pk-yrs)    Types: Cigarettes    Passive exposure: Past   Smokeless tobacco: Never  Vaping Use   Vaping status: Never Used  Substance and Sexual Activity   Alcohol use: Never   Drug use: Never   Sexual activity: Not on file  Other Topics Concern   Not on file  Social History Narrative   Not on file   Social Determinants of Health   Financial Resource Strain: Not on file  Food Insecurity: No Food Insecurity (04/12/2023)   Hunger Vital Sign    Worried About Running Out of Food in the Last Year: Never true    Ran Out of Food in the Last Year: Never true  Transportation Needs: No Transportation Needs (04/12/2023)   PRAPARE - Administrator, Civil Service (Medical): No    Lack of Transportation (Non-Medical): No  Physical Activity: Not on file  Stress: Not  on file  Social Connections: Not on file  Intimate Partner Violence: Not At Risk (04/12/2023)   Humiliation, Afraid, Rape, and Kick questionnaire    Fear of Current or Ex-Partner: No    Emotionally Abused: No    Physically Abused: No    Sexually Abused: No    FAMILY HISTORY: Family History  Adopted: Yes    ALLERGIES:  is allergic to pseudoephedrine hcl.  MEDICATIONS:  Current Outpatient Medications  Medication Sig Dispense Refill   ciprofloxacin (CIPRO) 250 MG tablet Take 1 tablet (250 mg  total) by mouth 2 (two) times daily for 3 days. 6 tablet 0   dicyclomine (BENTYL) 10 MG capsule Take 1 capsule (10 mg total) by mouth 3 (three) times daily as needed for up to 30 doses for spasms. 30 capsule 0   docusate sodium (COLACE) 100 MG capsule Take 1 capsule (100 mg total) by mouth 2 (two) times daily. 60 capsule 2   estradiol (ESTRACE) 1 MG tablet TAKE 1 TABLET BY MOUTH DAILY 90 tablet 3   lidocaine-prilocaine (EMLA) cream Apply 1 Application topically as needed. 30 g 0   ondansetron (ZOFRAN) 8 MG tablet Take 1 tablet (8 mg total) by mouth every 8 (eight) hours as needed for up to 30 doses for nausea or vomiting. 30 tablet 1   oxybutynin (DITROPAN) 5 MG tablet Take 1 tablet (5 mg total) by mouth every 8 (eight) hours as needed for bladder spasms. 30 tablet 0   oxycodone-acetaminophen (PERCOCET) 2.5-325 MG tablet Take 1 tablet by mouth every 6 (six) hours as needed for pain.     prochlorperazine (COMPAZINE) 10 MG tablet Take 1 tablet (10 mg total) by mouth every 6 (six) hours as needed for nausea or vomiting. 30 tablet 0   senna (SENOKOT) 8.6 MG TABS tablet Take 2 tablets (17.2 mg total) by mouth daily as needed for mild constipation. 60 tablet 2   topiramate (TOPAMAX) 50 MG tablet Take 2 tablets (100 mg total) by mouth at bedtime. 180 tablet 3   trimethoprim (TRIMPEX) 100 MG tablet Take 1 tablet (100 mg total) by mouth daily. 30 tablet 11   celecoxib (CELEBREX) 200 MG capsule TAKE 1 CAPSULE  BY MOUTH DAILY (Patient not taking: Reported on 05/28/2023) 90 capsule 3   HYDROcodone-acetaminophen (NORCO/VICODIN) 5-325 MG tablet Take 1 tablet by mouth every 6 (six) hours as needed for moderate pain. (Patient not taking: Reported on 06/17/2023) 20 tablet 0   ibuprofen (ADVIL) 200 MG tablet Take 800 mg by mouth 2 (two) times daily. (Patient not taking: Reported on 05/07/2023)     potassium chloride SA (KLOR-CON M) 20 MEQ tablet Take 1 tablet (20 mEq total) by mouth daily for 2 days. 2 tablet 0   No current facility-administered medications for this visit.   Facility-Administered Medications Ordered in Other Visits  Medication Dose Route Frequency Provider Last Rate Last Admin   heparin lock flush 100 unit/mL  500 Units Intravenous Once Michaelyn Barter, MD       heparin lock flush 100 unit/mL  500 Units Intravenous Once Michaelyn Barter, MD       heparin lock flush 100 unit/mL  500 Units Intravenous Once Michaelyn Barter, MD       sodium chloride flush (NS) 0.9 % injection 10 mL  10 mL Intravenous Once Michaelyn Barter, MD       sodium chloride flush (NS) 0.9 % injection 10 mL  10 mL Intravenous Once Michaelyn Barter, MD       sodium chloride flush (NS) 0.9 % injection 10 mL  10 mL Intravenous Once Michaelyn Barter, MD        REVIEW OF SYSTEMS:   Pertinent information mentioned in HPI All other systems were reviewed with the patient and are negative.  PHYSICAL EXAMINATION: ECOG PERFORMANCE STATUS: 1 - Symptomatic but completely ambulatory  Vitals:   06/17/23 0956  BP: 128/66  Pulse: 75  Temp: 98.9 F (37.2 C)  SpO2: 100%    Filed Weights   06/17/23 0956  Weight: 120 lb (54.4 kg)  GENERAL:alert, no distress and comfortable SKIN: skin color, texture, turgor are normal, no rashes or significant lesions EYES: normal, conjunctiva are pink and non-injected, sclera clear OROPHARYNX:no exudate, no erythema and lips, buccal mucosa, and tongue normal  NECK: supple, thyroid normal size,  non-tender, without nodularity LYMPH:  no palpable lymphadenopathy in the cervical, axillary or inguinal LUNGS: clear to auscultation and percussion with normal breathing effort HEART: regular rate & rhythm and no murmurs and no lower extremity edema ABDOMEN:abdomen soft, non-tender and normal bowel sounds Musculoskeletal:no cyanosis of digits and no clubbing  PSYCH: alert & oriented x 3 with fluent speech NEURO: no focal motor/sensory deficits  LABORATORY DATA:  I have reviewed the data as listed Lab Results  Component Value Date   WBC 13.7 (H) 06/17/2023   HGB 10.3 (L) 06/17/2023   HCT 32.1 (L) 06/17/2023   MCV 99.7 06/17/2023   PLT 232 06/17/2023   Recent Labs    06/04/23 0949 06/09/23 0938 06/11/23 1010 06/17/23 0932  NA 136 135 133* 133*  K 4.2 3.8 3.4* 3.7  CL 104 103 99 100  CO2 26 23 24 26   GLUCOSE 96 93 151* 96  BUN 22 22 15  24*  CREATININE 0.97 0.96 1.05* 1.36*  CALCIUM 8.6* 8.4* 8.5* 8.7*  GFRNONAA >60 >60 >60 44*  PROT 6.9 6.2*  --  7.0  ALBUMIN 3.7 3.5  --  4.1  AST 17 22  --  18  ALT 19 17  --  13  ALKPHOS 60 84  --  93  BILITOT 0.7 0.7  --  0.2*    RADIOGRAPHIC STUDIES: I have personally reviewed the radiological images as listed and agreed with the findings in the report. CT ABDOMEN PELVIS W CONTRAST  Result Date: 06/09/2023 CLINICAL DATA:  Left lower quadrant pain. History of bladder cancer with last chemotherapy 4 days ago. EXAM: CT ABDOMEN AND PELVIS WITH CONTRAST TECHNIQUE: Multidetector CT imaging of the abdomen and pelvis was performed using the standard protocol following bolus administration of intravenous contrast. RADIATION DOSE REDUCTION: This exam was performed according to the departmental dose-optimization program which includes automated exposure control, adjustment of the mA and/or kV according to patient size and/or use of iterative reconstruction technique. CONTRAST:  75mL OMNIPAQUE IOHEXOL 300 MG/ML  SOLN COMPARISON:  03/22/2023  FINDINGS: Lower chest: Visualized lung bases are clear.  Heart is normal size. Hepatobiliary: Liver, gallbladder and biliary tree are normal. Dome of the liver is not completely imaged. Pancreas: Normal. Spleen: Normal. Adrenals/Urinary Tract: Adrenal glands are normal. Kidneys are normal in size. Double-J left-sided internal ureteral stent is in adequate position. Interval improvement in previously seen left-sided hydronephrosis. Right ureter is normal. Postsurgical changes of the left side of the bladder compatible prior bladder tumor excision. Stomach/Bowel: Stomach and small bowel are normal. Previous appendectomy. Colon is unremarkable. Vascular/Lymphatic: Mild-to-moderate calcified plaque over the abdominal aorta which is otherwise normal in caliber. Remaining vascular structures are unremarkable. No evidence of adenopathy. Reproductive: Previous hysterectomy.  Adnexal regions are unchanged. Other: No free fluid or focal inflammatory change. Musculoskeletal: No focal abnormality. IMPRESSION: 1. No acute findings in the abdomen/pelvis. 2. Double-J left-sided internal ureteral stent in adequate position. Interval improvement in previously seen left-sided hydronephrosis. Postsurgical changes of the left side of the bladder compatible prior bladder tumor excision. 3. Aortic atherosclerosis. Aortic Atherosclerosis (ICD10-I70.0). Electronically Signed   By: Elberta Fortis M.D.   On: 06/09/2023 12:23

## 2023-06-19 ENCOUNTER — Encounter: Payer: Self-pay | Admitting: Internal Medicine

## 2023-06-20 ENCOUNTER — Other Ambulatory Visit: Payer: Managed Care, Other (non HMO)

## 2023-06-20 ENCOUNTER — Ambulatory Visit: Payer: Managed Care, Other (non HMO) | Admitting: Internal Medicine

## 2023-06-21 ENCOUNTER — Ambulatory Visit: Payer: Managed Care, Other (non HMO)

## 2023-06-25 ENCOUNTER — Other Ambulatory Visit: Payer: Managed Care, Other (non HMO)

## 2023-06-25 ENCOUNTER — Inpatient Hospital Stay: Payer: Managed Care, Other (non HMO)

## 2023-06-25 ENCOUNTER — Inpatient Hospital Stay: Payer: Managed Care, Other (non HMO) | Attending: Internal Medicine | Admitting: Internal Medicine

## 2023-06-25 VITALS — BP 113/62 | HR 75 | Temp 97.6°F | Wt 123.0 lb

## 2023-06-25 DIAGNOSIS — R109 Unspecified abdominal pain: Secondary | ICD-10-CM

## 2023-06-25 DIAGNOSIS — C679 Malignant neoplasm of bladder, unspecified: Secondary | ICD-10-CM | POA: Insufficient documentation

## 2023-06-25 DIAGNOSIS — Z5111 Encounter for antineoplastic chemotherapy: Secondary | ICD-10-CM | POA: Diagnosis not present

## 2023-06-25 DIAGNOSIS — Z79899 Other long term (current) drug therapy: Secondary | ICD-10-CM | POA: Diagnosis not present

## 2023-06-25 DIAGNOSIS — Z5189 Encounter for other specified aftercare: Secondary | ICD-10-CM | POA: Insufficient documentation

## 2023-06-25 LAB — CBC WITH DIFFERENTIAL (CANCER CENTER ONLY)
Abs Immature Granulocytes: 0.08 10*3/uL — ABNORMAL HIGH (ref 0.00–0.07)
Basophils Absolute: 0.1 10*3/uL (ref 0.0–0.1)
Basophils Relative: 2 %
Eosinophils Absolute: 0.3 10*3/uL (ref 0.0–0.5)
Eosinophils Relative: 5 %
HCT: 32.1 % — ABNORMAL LOW (ref 36.0–46.0)
Hemoglobin: 10.3 g/dL — ABNORMAL LOW (ref 12.0–15.0)
Immature Granulocytes: 1 %
Lymphocytes Relative: 38 %
Lymphs Abs: 2.3 10*3/uL (ref 0.7–4.0)
MCH: 32.6 pg (ref 26.0–34.0)
MCHC: 32.1 g/dL (ref 30.0–36.0)
MCV: 101.6 fL — ABNORMAL HIGH (ref 80.0–100.0)
Monocytes Absolute: 1 10*3/uL (ref 0.1–1.0)
Monocytes Relative: 16 %
Neutro Abs: 2.4 10*3/uL (ref 1.7–7.7)
Neutrophils Relative %: 38 %
Platelet Count: 364 10*3/uL (ref 150–400)
RBC: 3.16 MIL/uL — ABNORMAL LOW (ref 3.87–5.11)
RDW: 16.1 % — ABNORMAL HIGH (ref 11.5–15.5)
WBC Count: 6.1 10*3/uL (ref 4.0–10.5)
nRBC: 0 % (ref 0.0–0.2)

## 2023-06-25 LAB — CMP (CANCER CENTER ONLY)
ALT: 12 U/L (ref 0–44)
AST: 17 U/L (ref 15–41)
Albumin: 4 g/dL (ref 3.5–5.0)
Alkaline Phosphatase: 54 U/L (ref 38–126)
Anion gap: 8 (ref 5–15)
BUN: 26 mg/dL — ABNORMAL HIGH (ref 8–23)
CO2: 28 mmol/L (ref 22–32)
Calcium: 8.8 mg/dL — ABNORMAL LOW (ref 8.9–10.3)
Chloride: 101 mmol/L (ref 98–111)
Creatinine: 0.99 mg/dL (ref 0.44–1.00)
GFR, Estimated: >60 mL/min (ref >60)
Glucose, Bld: 88 mg/dL (ref 70–99)
Potassium: 4 mmol/L (ref 3.5–5.1)
Sodium: 137 mmol/L (ref 135–145)
Total Bilirubin: 0.4 mg/dL (ref 0.3–1.2)
Total Protein: 6.6 g/dL (ref 6.5–8.1)

## 2023-06-25 LAB — MAGNESIUM: Magnesium: 2.1 mg/dL (ref 1.7–2.4)

## 2023-06-25 NOTE — Progress Notes (Signed)
[facility] CONSULT NOTE  Patient Care Team: McDonough, Renard Matter as PCP - General (Physician Assistant) Michaelyn Barter, MD as Consulting Physician (Oncology)  REFERRING PROVIDER: Dr. Apolinar Junes  REASON FOR REFFERAL: Muscle invasive bladder cancer  CANCER STAGING   Cancer Staging  Urothelial cancer [facility]) Staging form: Kidney, AJCC 8th Edition - Clinical: Stage III (cT3, cN0, cM0) - Signed by Michaelyn Barter, MD on 05/07/2023 Stage prefix: Initial diagnosis   ASSESSMENT & PLAN:  Barbara [name] 62 y.o. female with pmh of anxiety, GERD, right carotid artery stenosis was referred to medical oncology for new diagnosis of muscle invasive urothelial cancer.  # Muscle invasive urothelial carcinoma, cT3N0M0 # Moderate left hydroureteronephrosis s/p left ureteral stent - Patient developed urinary symptoms around September or October 2023.  Was treated with multiple rounds of antibiotics with no improvement.  She was then referred to Dr. Apolinar Junes for further evaluation.  - CTU from 03/22/2023 showed moderate left hydroureteronephrosis to the UVJ, marked leftward asymmetric wall thickening of the urinary bladder measuring 18 mm in maximal thickness which extends over the UVJ.  Perivesical stranding soft tissue.  No evidence of upper tract or abdominopelvic metastatic disease.  - s/p TURBT with left ureteral stent placement by Dr. Apolinar Junes.  IntraOp findings showed large irregular mucosal submucosal extensive tumor from the left dome, left lateral bladder wall majority of trigone including the left UO and bladder neck.  Measures at least 5 x 5 cm. Pathology showed invasive high-grade urothelial carcinoma.  There is some discrepancy.  Lower down in the report mentions low-grade.  Invades muscularis propria.  -CT chest without contrast did not show any evidence of metastatic disease.  Showed mild COPD changes, there is aortic atherosclerosis as well as atherosclerosis of great vessels of  mediastinum and coronary arteries including calcified atherosclerotic plaque in the left main, left anterior descending and left circumflex coronary artery.  Calcifications of the aortic valve.  -Patient was seen today prior to cycle 3-day 1 of split dose cisplatin and gemcitabine.  Chemo was held last week due to worsening creatinine to 1.3 with creatinine clearance of 36.  Likely secondary to contrast exposure and Bactrim.  IV fluids given.  Today her creatinine is improved to 0.9 with creatinine clearance of 51.  Will proceed with treatment tomorrow.    # Abdominal cramp # Mucus discharge -Patient presented to ED on 06/09/2023.  CT abdomen pelvis did not show any acute changes. -Her symptoms started with the chemotherapy treatment and may be related to it.  Continue with Bentyl 10 mg q. 8 as needed which is helping -Taking Percocet 2.5-325 every 6 as needed -Discussed about GI referral and patient would like to hold off.  # Chemotherapy induced anemia -Hemoglobin 10.3.  Continue to monitor.  # Chemo induced nausea -Continue with dexamethasone for 3 days post chemo.  Continue with Zofran and also advised the patient to add Compazine as needed  # Constipation-does not like MiraLAX.  Sent prescription for Colace and senna  # Acid reflux/gas/bloating-advised to take OTC simethicone, Tums, Pepcid as needed.  # Coronary atherosclerosis -Detected as incidental finding on CT done for staging purposes. Her last lipid panel showed LDL of 138.  Advised to discuss with primary about role of statin.  Rest per primary.  # Mild COPD changes -On CT's chest.  Prior history of smoking.  Asymptomatic.  # Access-port placed on 05/03/2023  Orders Placed This Encounter  Procedures   CBC with Differential (Cancer Center Only)  Standing Status:   Future    Standing Expiration Date:   07/14/2024   CMP (Cancer Center only)    Standing Status:   Future    Standing Expiration Date:   07/14/2024   Magnesium     Standing Status:   Future    Standing Expiration Date:   07/14/2024   CBC with Differential (Cancer Center Only)    Standing Status:   Future    Standing Expiration Date:   07/21/2024   CMP (Cancer Center only)    Standing Status:   Future    Standing Expiration Date:   07/21/2024   Magnesium    Standing Status:   Future    Standing Expiration Date:   07/21/2024   RTC in 1 week for MD visit, labs, cycle 3-day 1 of cisplatin and gemcitabine RTC in 2-week for MD visit, labs, cycle 3-day 8 RTC in 3-week for labs and fluid only RTC in 4-week for MD visit, labs, cycle 4-day 1 of cisplatin and gemcitabine  The total time spent in the appointment was 30 minutes encounter with patients including review of chart and various tests results, discussions about plan of care and coordination of care plan   All questions were answered. The patient knows to call the clinic with any problems, questions or concerns. No barriers to learning was detected.  Michaelyn Barter, MD 9/3/202411:44 AM   HISTORY OF PRESENTING ILLNESS:  Barbara [name] 62 y.o. female with pmh of pmh of anxiety, GERD, right carotid artery stenosis was referred to medical oncology for new diagnosis of muscle invasive urothelial cancer.  Patient develops urinary symptoms around September or October 2023.  Was treated with multiple rounds of antibiotics with no improvement.  She was then referred to Dr. Apolinar Junes for further evaluation.  CTU from 03/22/2023 showed moderate left hydroureteronephrosis to the UVJ, marked leftward asymmetric wall thickening of the urinary bladder measuring 18 mm in maximal thickness which extends over the UVJ.  Perivesical stranding soft tissue.  No evidence of upper tract or abdominopelvic metastatic disease.  s/p TURBT with left ureteral stent placement by Dr. Apolinar Junes.  IntraOp findings showed large irregular mucosal submucosal extensive tumor from the left dome, left lateral bladder wall majority of trigone  including the left UO and bladder neck.  Measures at least 5 x 5 cm.  Pathology showed invasive high-grade urothelial carcinoma.  There is some discrepancy.  Lower down in the report mentions low-grade.  Invades muscularis propria.  Interval history Patient was seen today prior to cycle 3 day 1 of cisplatin and gemcitabine. Doing well overall.  Still having some abdominal cramps which tend to fluctuate.  But better than last week.  Still having mucus.  Bowel movements tend to fluctuate.  She has constipations alternating with diarrhea.  Denies any neuropathy or hearing changes.  I have reviewed her chart and materials related to her cancer extensively and collaborated history with the patient. Summary of oncologic history is as follows: Oncology History  Urothelial cancer (HCC)  04/12/2023 Initial Diagnosis   Urothelial cancer (HCC)   04/12/2023 Cancer Staging   Staging form: Kidney, AJCC 8th Edition - Clinical: Stage III (cT3, cN0, cM0) - Signed by Michaelyn Barter, MD on 05/07/2023 Stage prefix: Initial diagnosis   Malignant neoplasm of urinary bladder (HCC)  04/22/2023 Initial Diagnosis   Malignant neoplasm of urinary bladder (HCC)   05/07/2023 -  Chemotherapy   Patient is on Treatment Plan : BLADDER Gemcitabine D1,8 + Cisplatin (split dose) D1,8  q21d x 4 cycles       MEDICAL HISTORY:  Past Medical History:  Diagnosis Date   Adopted    Allergic rhinitis    Anemia    h/o   Anxiety    Arthritis    B12 deficiency    Bilateral carotid artery stenosis    Bilateral carotid bruits    Bladder tumor    Cancer (HCC)    bladder cancer   Depression    Gallbladder polyp    GERD (gastroesophageal reflux disease)    Hydronephrosis of left kidney    Migraines    Tobacco abuse    UTI (urinary tract infection)     SURGICAL HISTORY: Past Surgical History:  Procedure Laterality Date   ABDOMINAL HYSTERECTOMY     BLADDER SURGERY     BREAST BIOPSY Left 2014   neg   CYSTOSCOPY W/  URETERAL STENT PLACEMENT Left 04/04/2023   Procedure: CYSTOSCOPY WITH RETROGRADE PYELOGRAM/URETERAL STENT PLACEMENT;  Surgeon: Vanna Scotland, MD;  Location: [facility] ORS;  Service: Urology;  Laterality: Left;   FOOT SURGERY Right    2009   IR IMAGING GUIDED PORT INSERTION  05/03/2023   LAPAROSCOPIC APPENDECTOMY  12/05/2012   NASAL SEPTUM SURGERY     SISTRUNK PROCEDURE  03/03/2012   Thyroglossal duct cyst excision   TONSILLECTOMY     TRANSURETHRAL RESECTION OF BLADDER TUMOR N/A 04/04/2023   Procedure: TRANSURETHRAL RESECTION OF BLADDER TUMOR (TURBT);  Surgeon: Vanna Scotland, MD;  Location: [facility] OR[facility];  Service: Urology;  Laterality: N/A;    SOCIAL HISTORY: Social History   Socioeconomic History   Marital status: Married    Spouse name: Not on file   Number of children: Not on file   Years of education: Not on file   Highest education level: Not on file  Occupational History   Not on file  Tobacco Use   Smoking status: Former    Current packs/day: 0.25    Average packs/day: 0.3 packs/day for 30.0 years (7.5 ttl pk-yrs)    Types: Cigarettes    Passive exposure: Past   Smokeless tobacco: Never  Vaping Use   Vaping status: Never Used  Substance and Sexual Activity   Alcohol use: Never   Drug use: Never   Sexual activity: Not on file  Other Topics Concern   Not on file  Social History Narrative   Not on file   Social Determinants of Health   Financial Resource Strain: Not on file  Food Insecurity: No Food Insecurity (04/12/2023)   Hunger Vital Sign    Worried About Running Out of Food in the Last Year: Never true    Ran Out of Food in the Last Year: Never true  Transportation Needs: No Transportation Needs (04/12/2023)   PRAPARE - Administrator, Civil Service (Medical): No    Lack of Transportation (Non-Medical): No  Physical Activity: Not on file  Stress: Not on file  Social Connections: Not on file  Intimate Partner Violence: Not At Risk (04/12/2023)    Humiliation, Afraid, Rape, and Kick questionnaire    Fear of Current or Ex-Partner: No    Emotionally Abused: No    Physically Abused: No    Sexually Abused: No    FAMILY HISTORY: Family History  Adopted: Yes    ALLERGIES:  is allergic to pseudoephedrine hcl.  MEDICATIONS:  Current Outpatient Medications  Medication Sig Dispense Refill   dicyclomine (BENTYL) 10 MG capsule Take 1 capsule (10 mg total) by mouth 3 (  three) times daily as needed for up to 30 doses for spasms. 30 capsule 0   docusate sodium (COLACE) 100 MG capsule Take 1 capsule (100 mg total) by mouth 2 (two) times daily. 60 capsule 2   estradiol (ESTRACE) 1 MG tablet TAKE 1 TABLET BY MOUTH DAILY 90 tablet 3   lidocaine-prilocaine (EMLA) cream Apply 1 Application topically as needed. 30 g 0   ondansetron (ZOFRAN) 8 MG tablet Take 1 tablet (8 mg total) by mouth every 8 (eight) hours as needed for up to 30 doses for nausea or vomiting. 30 tablet 1   oxybutynin (DITROPAN) 5 MG tablet Take 1 tablet (5 mg total) by mouth every 8 (eight) hours as needed for bladder spasms. 30 tablet 0   oxycodone-acetaminophen (PERCOCET) 2.5-325 MG tablet Take 1 tablet by mouth every 6 (six) hours as needed for pain.     prochlorperazine (COMPAZINE) 10 MG tablet Take 1 tablet (10 mg total) by mouth every 6 (six) hours as needed for nausea or vomiting. 30 tablet 0   senna (SENOKOT) 8.6 MG TABS tablet Take 2 tablets (17.2 mg total) by mouth daily as needed for mild constipation. 60 tablet 2   topiramate (TOPAMAX) 50 MG tablet Take 2 tablets (100 mg total) by mouth at bedtime. 180 tablet 3   trimethoprim (TRIMPEX) 100 MG tablet Take 1 tablet (100 mg total) by mouth daily. 30 tablet 11   celecoxib (CELEBREX) 200 MG capsule TAKE 1 CAPSULE BY MOUTH DAILY (Patient not taking: Reported on 05/28/2023) 90 capsule 3   HYDROcodone-acetaminophen (NORCO/VICODIN) 5-325 MG tablet Take 1 tablet by mouth every 6 (six) hours as needed for moderate pain. (Patient not  taking: Reported on 06/17/2023) 20 tablet 0   ibuprofen (ADVIL) 200 MG tablet Take 800 mg by mouth 2 (two) times daily. (Patient not taking: Reported on 05/07/2023)     potassium chloride SA (KLOR-CON M) 20 MEQ tablet Take 1 tablet (20 mEq total) by mouth daily for 2 days. 2 tablet 0   No current facility-administered medications for this visit.   Facility-Administered Medications Ordered in Other Visits  Medication Dose Route Frequency Provider Last Rate Last Admin   heparin lock flush 100 unit/mL  500 Units Intravenous Once Michaelyn Barter, MD       heparin lock flush 100 unit/mL  500 Units Intravenous Once Michaelyn Barter, MD       heparin lock flush 100 unit/mL  500 Units Intravenous Once Michaelyn Barter, MD       sodium chloride flush (NS) 0.9 % injection 10 mL  10 mL Intravenous Once Michaelyn Barter, MD       sodium chloride flush (NS) 0.9 % injection 10 mL  10 mL Intravenous Once Michaelyn Barter, MD       sodium chloride flush (NS) 0.9 % injection 10 mL  10 mL Intravenous Once Michaelyn Barter, MD        REVIEW OF SYSTEMS:   Pertinent information mentioned in HPI All other systems were reviewed with the patient and are negative.  PHYSICAL EXAMINATION: ECOG PERFORMANCE STATUS: 1 - Symptomatic but completely ambulatory  Vitals:   06/25/23 0914  BP: 113/62  Pulse: 75  Temp: 97.6 F (36.4 C)  SpO2: 100%    Filed Weights   06/25/23 0914  Weight: 123 lb (55.8 kg)     GENERAL:alert, no distress and comfortable SKIN: skin color, texture, turgor are normal, no rashes or significant lesions EYES: normal, conjunctiva are pink and non-injected, sclera clear OROPHARYNX:no  exudate, no erythema and lips, buccal mucosa, and tongue normal  NECK: supple, thyroid normal size, non-tender, without nodularity LYMPH:  no palpable lymphadenopathy in the cervical, axillary or inguinal LUNGS: clear to auscultation and percussion with normal breathing effort HEART: regular rate & rhythm and no  murmurs and no lower extremity edema ABDOMEN:abdomen soft, non-tender and normal bowel sounds Musculoskeletal:no cyanosis of digits and no clubbing  PSYCH: alert & oriented x 3 with fluent speech NEURO: no focal motor/sensory deficits  LABORATORY DATA:  I have reviewed the data as listed Lab Results  Component Value Date   WBC 6.1 06/25/2023   HGB 10.3 (L) 06/25/2023   HCT 32.1 (L) 06/25/2023   MCV 101.6 (H) 06/25/2023   PLT 364 06/25/2023   Recent Labs    06/09/23 0938 06/11/23 1010 06/17/23 0932 06/25/23 0851  NA 135 133* 133* 137  K 3.8 3.4* 3.7 4.0  CL 103 99 100 101  CO2 23 24 26 28   GLUCOSE 93 151* 96 88  BUN 22 15 24* 26*  CREATININE 0.96 1.05* 1.36* 0.99  CALCIUM 8.4* 8.5* 8.7* 8.8*  GFRNONAA >60 >60 44* >60  PROT 6.2*  --  7.0 6.6  ALBUMIN 3.5  --  4.1 4.0  AST 22  --  18 17  ALT 17  --  13 12  ALKPHOS 84  --  93 54  BILITOT 0.7  --  0.2* 0.4    RADIOGRAPHIC STUDIES: I have personally reviewed the radiological images as listed and agreed with the findings in the report. CT ABDOMEN PELVIS W CONTRAST  Result Date: 06/09/2023 CLINICAL DATA:  Left lower quadrant pain. History of bladder cancer with last chemotherapy 4 days ago. EXAM: CT ABDOMEN AND PELVIS WITH CONTRAST TECHNIQUE: Multidetector CT imaging of the abdomen and pelvis was performed using the standard protocol following bolus administration of intravenous contrast. RADIATION DOSE REDUCTION: This exam was performed according to the departmental dose-optimization program which includes automated exposure control, adjustment of the mA and/or kV according to patient size and/or use of iterative reconstruction technique. CONTRAST:  75mL OMNIPAQUE IOHEXOL 300 MG/ML  SOLN COMPARISON:  03/22/2023 FINDINGS: Lower chest: Visualized lung bases are clear.  Heart is normal size. Hepatobiliary: Liver, gallbladder and biliary tree are normal. Dome of the liver is not completely imaged. Pancreas: Normal. Spleen: Normal.  Adrenals/Urinary Tract: Adrenal glands are normal. Kidneys are normal in size. Double-J left-sided internal ureteral stent is in adequate position. Interval improvement in previously seen left-sided hydronephrosis. Right ureter is normal. Postsurgical changes of the left side of the bladder compatible prior bladder tumor excision. Stomach/Bowel: Stomach and small bowel are normal. Previous appendectomy. Colon is unremarkable. Vascular/Lymphatic: Mild-to-moderate calcified plaque over the abdominal aorta which is otherwise normal in caliber. Remaining vascular structures are unremarkable. No evidence of adenopathy. Reproductive: Previous hysterectomy.  Adnexal regions are unchanged. Other: No free fluid or focal inflammatory change. Musculoskeletal: No focal abnormality. IMPRESSION: 1. No acute findings in the abdomen/pelvis. 2. Double-J left-sided internal ureteral stent in adequate position. Interval improvement in previously seen left-sided hydronephrosis. Postsurgical changes of the left side of the bladder compatible prior bladder tumor excision. 3. Aortic atherosclerosis. Aortic Atherosclerosis (ICD10-I70.0). Electronically Signed   By: Elberta Fortis M.D.   On: 06/09/2023 12:23

## 2023-06-25 NOTE — Progress Notes (Signed)
Patient says that she is still having the stomach cramps and mucus in her stool, she says that it has gotten a little bit better but not much.

## 2023-06-26 ENCOUNTER — Inpatient Hospital Stay: Payer: Managed Care, Other (non HMO)

## 2023-06-26 VITALS — BP 126/74 | HR 86 | Temp 97.5°F | Resp 18

## 2023-06-26 DIAGNOSIS — Z5111 Encounter for antineoplastic chemotherapy: Secondary | ICD-10-CM | POA: Diagnosis not present

## 2023-06-26 DIAGNOSIS — C679 Malignant neoplasm of bladder, unspecified: Secondary | ICD-10-CM

## 2023-06-26 MED ORDER — PALONOSETRON HCL INJECTION 0.25 MG/5ML
0.2500 mg | Freq: Once | INTRAVENOUS | Status: AC
  Administered 2023-06-26: 0.25 mg via INTRAVENOUS
  Filled 2023-06-26: qty 5

## 2023-06-26 MED ORDER — SODIUM CHLORIDE 0.9 % IV SOLN
Freq: Once | INTRAVENOUS | Status: AC
  Filled 2023-06-26: qty 250

## 2023-06-26 MED ORDER — SODIUM CHLORIDE 0.9 % IV SOLN
35.0000 mg/m2 | Freq: Once | INTRAVENOUS | Status: AC
  Administered 2023-06-26: 56 mg via INTRAVENOUS
  Filled 2023-06-26: qty 56

## 2023-06-26 MED ORDER — HEPARIN SOD (PORK) LOCK FLUSH 100 UNIT/ML IV SOLN
500.0000 [IU] | Freq: Once | INTRAVENOUS | Status: AC | PRN
  Administered 2023-06-26: 500 [IU]
  Filled 2023-06-26: qty 5

## 2023-06-26 MED ORDER — SODIUM CHLORIDE 0.9 % IV SOLN
1000.0000 mg/m2 | Freq: Once | INTRAVENOUS | Status: AC
  Administered 2023-06-26: 1596 mg via INTRAVENOUS
  Filled 2023-06-26: qty 41.98

## 2023-06-26 MED ORDER — POTASSIUM CHLORIDE IN NACL 20-0.9 MEQ/L-% IV SOLN
Freq: Once | INTRAVENOUS | Status: AC
  Filled 2023-06-26: qty 1000

## 2023-06-26 MED ORDER — SODIUM CHLORIDE 0.9 % IV SOLN
150.0000 mg | Freq: Once | INTRAVENOUS | Status: AC
  Administered 2023-06-26: 150 mg via INTRAVENOUS
  Filled 2023-06-26: qty 150

## 2023-06-26 MED ORDER — MAGNESIUM SULFATE 2 GM/50ML IV SOLN
2.0000 g | Freq: Once | INTRAVENOUS | Status: AC
  Administered 2023-06-26: 2 g via INTRAVENOUS
  Filled 2023-06-26: qty 50

## 2023-06-26 MED ORDER — SODIUM CHLORIDE 0.9 % IV SOLN
10.0000 mg | Freq: Once | INTRAVENOUS | Status: AC
  Administered 2023-06-26: 10 mg via INTRAVENOUS
  Filled 2023-06-26: qty 10

## 2023-06-26 MED ORDER — SODIUM CHLORIDE 0.9 % IV SOLN
Freq: Once | INTRAVENOUS | Status: DC
  Filled 2023-06-26: qty 250

## 2023-06-26 NOTE — Patient Instructions (Signed)
[facility]  Discharge Instructions: Thank you for choosing [facility] to provide your oncology and hematology care.  If you have a lab appointment with the Cancer Center, please go directly to the Cancer Center and check in at the registration area.  Wear comfortable clothing and clothing appropriate for easy access to any Portacath or PICC line.   We strive to give you quality time with your provider. You may need to reschedule your appointment if you arrive late (15 or more minutes).  Arriving late affects you and other patients whose appointments are after yours.  Also, if you miss three or more appointments without notifying the office, you may be dismissed from the clinic at the provider's discretion.      For prescription refill requests, have your pharmacy contact our office and allow 72 hours for refills to be completed.    Today you received the following chemotherapy and/or immunotherapy agents CISPLATIN and GEMZAR      To help prevent nausea and vomiting after your treatment, we encourage you to take your nausea medication as directed.  BELOW ARE SYMPTOMS THAT SHOULD BE REPORTED IMMEDIATELY: *FEVER GREATER THAN 100.4 F (38 C) OR HIGHER *CHILLS OR SWEATING *NAUSEA AND VOMITING THAT IS NOT CONTROLLED WITH YOUR NAUSEA MEDICATION *UNUSUAL SHORTNESS OF BREATH *UNUSUAL BRUISING OR BLEEDING *URINARY PROBLEMS (pain or burning when urinating, or frequent urination) *BOWEL PROBLEMS (unusual diarrhea, constipation, pain near the anus) TENDERNESS IN MOUTH AND THROAT WITH OR WITHOUT PRESENCE OF ULCERS (sore throat, sores in mouth, or a toothache) UNUSUAL RASH, SWELLING OR PAIN  UNUSUAL VAGINAL DISCHARGE OR ITCHING   Items with * indicate a potential emergency and should be followed up as soon as possible or go to the Emergency Department if any problems should occur.  Please show the CHEMOTHERAPY ALERT CARD or IMMUNOTHERAPY ALERT CARD at  check-in to the Emergency Department and triage nurse.  Should you have questions after your visit or need to cancel or reschedule your appointment, please contact [facility]  [phone] and follow the prompts.  Office hours are 8:00 a.m. to 4:30 p.m. Monday - Friday. Please note that voicemails left after 4:00 p.m. may not be returned until the following business day.  We are closed weekends and major holidays. You have access to a nurse at all times for urgent questions. Please call the main number to the clinic [phone] and follow the prompts.  For any non-urgent questions, you may also contact your provider using MyChart. We now offer e-Visits for anyone 18 and older to request care online for non-urgent symptoms. For details visit [email or web address].   Also download the MyChart app! Go to the app store, search "MyChart", open the app, select , and log in with your MyChart username and password.  Cisplatin Injection What is this medication? CISPLATIN (SIS pla tin) treats some types of cancer. It works by slowing down the growth of cancer cells. This medicine may be used for other purposes; ask your health care provider or pharmacist if you have questions. COMMON BRAND NAME(S): Platinol, Platinol -AQ What should I tell my care team before I take this medication? They need to know if you have any of these conditions: Eye disease, vision problems Hearing problems Kidney disease Low blood counts, such as low white cells, platelets, or red blood cells Tingling of the fingers or toes, or other nerve disorder An unusual or allergic reaction to cisplatin, carboplatin,  oxaliplatin, other medications, foods, dyes, or preservatives If you or your partner are pregnant or trying to get pregnant Breast-feeding How should I use this medication? This medication is injected into a vein. It is given by your care team in a hospital or clinic  setting. Talk to your care team about the use of this medication in children. Special care may be needed. Overdosage: If you think you have taken too much of this medicine contact a poison control center or emergency room at once. NOTE: This medicine is only for you. Do not share this medicine with others. What if I miss a dose? Keep appointments for follow-up doses. It is important not to miss your dose. Call your care team if you are unable to keep an appointment. What may interact with this medication? Do not take this medication with any of the following: Live virus vaccines This medication may also interact with the following: Certain antibiotics, such as amikacin, gentamicin, neomycin, polymyxin B, streptomycin, tobramycin, vancomycin Foscarnet This list may not describe all possible interactions. Give your health care provider a list of all the medicines, herbs, non-prescription drugs, or dietary supplements you use. Also tell them if you smoke, drink alcohol, or use illegal drugs. Some items may interact with your medicine. What should I watch for while using this medication? Your condition will be monitored carefully while you are receiving this medication. You may need blood work done while taking this medication. This medication may make you feel generally unwell. This is not uncommon, as chemotherapy can affect healthy cells as well as cancer cells. Report any side effects. Continue your course of treatment even though you feel ill unless your care team tells you to stop. This medication may increase your risk of getting an infection. Call your care team for advice if you get a fever, chills, sore throat, or other symptoms of a cold or flu. Do not treat yourself. Try to avoid being around people who are sick. Avoid taking medications that contain aspirin, acetaminophen, ibuprofen, naproxen, or ketoprofen unless instructed by your care team. These medications may hide a fever. This  medication may increase your risk to bruise or bleed. Call your care team if you notice any unusual bleeding. Be careful brushing or flossing your teeth or using a toothpick because you may get an infection or bleed more easily. If you have any dental work done, tell your dentist you are receiving this medication. Drink fluids as directed while you are taking this medication. This will help protect your kidneys. Call your care team if you get diarrhea. Do not treat yourself. Talk to your care team if you or your partner wish to become pregnant or think you might be pregnant. This medication can cause serious birth defects if taken during pregnancy and for 14 months after the last dose. A negative pregnancy test is required before starting this medication. A reliable form of contraception is recommended while taking this medication and for 14 months after the last dose. Talk to your care team about effective forms of contraception. Do not father a child while taking this medication and for 11 months after the last dose. Use a condom during sex during this time period. Do not breast-feed while taking this medication. This medication may cause infertility. Talk to your care team if you are concerned about your fertility. What side effects may I notice from receiving this medication? Side effects that you should report to your care team as soon as possible: Allergic  reactions--skin rash, itching, hives, swelling of the face, lips, tongue, or throat Eye pain, change in vision, vision loss Hearing loss, ringing in ears Infection--fever, chills, cough, sore throat, wounds that don't heal, pain or trouble when passing urine, general feeling of discomfort or being unwell Kidney injury--decrease in the amount of urine, swelling of the ankles, hands, or feet Low red blood cell level--unusual weakness or fatigue, dizziness, headache, trouble breathing Painful swelling, warmth, or redness of the skin, blisters or  sores at the infusion site Pain, tingling, or numbness in the hands or feet Unusual bruising or bleeding Side effects that usually do not require medical attention (report to your care team if they continue or are bothersome): Hair loss Nausea Vomiting This list may not describe all possible side effects. Call your doctor for medical advice about side effects. You may report side effects to FDA at 1-800-FDA-1088. Where should I keep my medication? This medication is given in a hospital or clinic. It will not be stored at home. NOTE: This sheet is a summary. It may not cover all possible information. If you have questions about this medicine, talk to your doctor, pharmacist, or health care provider.  2024 Elsevier/Gold Standard (2022-02-09 00:00:00)  Gemcitabine Injection What is this medication? GEMCITABINE (jem SYE ta been) treats some types of cancer. It works by slowing down the growth of cancer cells. This medicine may be used for other purposes; ask your health care provider or pharmacist if you have questions. COMMON BRAND NAME(S): Gemzar, Infugem What should I tell my care team before I take this medication? They need to know if you have any of these conditions: Blood disorders Infection Kidney disease Liver disease Lung or breathing disease, such as asthma or COPD Recent or ongoing radiation therapy An unusual or allergic reaction to gemcitabine, other medications, foods, dyes, or preservatives If you or your partner are pregnant or trying to get pregnant Breast-feeding How should I use this medication? This medication is injected into a vein. It is given by your care team in a hospital or clinic setting. Talk to your care team about the use of this medication in children. Special care may be needed. Overdosage: If you think you have taken too much of this medicine contact a poison control center or emergency room at once. NOTE: This medicine is only for you. Do not share  this medicine with others. What if I miss a dose? Keep appointments for follow-up doses. It is important not to miss your dose. Call your care team if you are unable to keep an appointment. What may interact with this medication? Interactions have not been studied. This list may not describe all possible interactions. Give your health care provider a list of all the medicines, herbs, non-prescription drugs, or dietary supplements you use. Also tell them if you smoke, drink alcohol, or use illegal drugs. Some items may interact with your medicine. What should I watch for while using this medication? Your condition will be monitored carefully while you are receiving this medication. This medication may make you feel generally unwell. This is not uncommon, as chemotherapy can affect healthy cells as well as cancer cells. Report any side effects. Continue your course of treatment even though you feel ill unless your care team tells you to stop. In some cases, you may be given additional medications to help with side effects. Follow all directions for their use. This medication may increase your risk of getting an infection. Call your care team for  advice if you get a fever, chills, sore throat, or other symptoms of a cold or flu. Do not treat yourself. Try to avoid being around people who are sick. This medication may increase your risk to bruise or bleed. Call your care team if you notice any unusual bleeding. Be careful brushing or flossing your teeth or using a toothpick because you may get an infection or bleed more easily. If you have any dental work done, tell your dentist you are receiving this medication. Avoid taking medications that contain aspirin, acetaminophen, ibuprofen, naproxen, or ketoprofen unless instructed by your care team. These medications may hide a fever. Talk to your care team if you or your partner wish to become pregnant or think you might be pregnant. This medication can cause  serious birth defects if taken during pregnancy and for 6 months after the last dose. A negative pregnancy test is required before starting this medication. A reliable form of contraception is recommended while taking this medication and for 6 months after the last dose. Talk to your care team about effective forms of contraception. Do not father a child while taking this medication and for 3 months after the last dose. Use a condom while having sex during this time period. Do not breastfeed while taking this medication and for at least 1 week after the last dose. This medication may cause infertility. Talk to your care team if you are concerned about your fertility. What side effects may I notice from receiving this medication? Side effects that you should report to your care team as soon as possible: Allergic reactions--skin rash, itching, hives, swelling of the face, lips, tongue, or throat Capillary leak syndrome--stomach or muscle pain, unusual weakness or fatigue, feeling faint or lightheaded, decrease in the amount of urine, swelling of the ankles, hands, or feet, trouble breathing Infection--fever, chills, cough, sore throat, wounds that don't heal, pain or trouble when passing urine, general feeling of discomfort or being unwell Liver injury--right upper belly pain, loss of appetite, nausea, light-colored stool, dark yellow or brown urine, yellowing skin or eyes, unusual weakness or fatigue Low red blood cell level--unusual weakness or fatigue, dizziness, headache, trouble breathing Lung injury--shortness of breath or trouble breathing, cough, spitting up blood, chest pain, fever Stomach pain, bloody diarrhea, pale skin, unusual weakness or fatigue, decrease in the amount of urine, which may be signs of hemolytic uremic syndrome Sudden and severe headache, confusion, change in vision, seizures, which may be signs of posterior reversible encephalopathy syndrome (PRES) Unusual bruising or  bleeding Side effects that usually do not require medical attention (report to your care team if they continue or are bothersome): Diarrhea Drowsiness Hair loss Nausea Pain, redness, or swelling with sores inside the mouth or throat Vomiting This list may not describe all possible side effects. Call your doctor for medical advice about side effects. You may report side effects to FDA at 1-800-FDA-1088. Where should I keep my medication? This medication is given in a hospital or clinic. It will not be stored at home. NOTE: This sheet is a summary. It may not cover all possible information. If you have questions about this medicine, talk to your doctor, pharmacist, or health care provider.  2024 Elsevier/Gold Standard (2022-02-13 00:00:00)

## 2023-06-27 ENCOUNTER — Ambulatory Visit: Payer: Managed Care, Other (non HMO)

## 2023-06-27 ENCOUNTER — Other Ambulatory Visit: Payer: Managed Care, Other (non HMO)

## 2023-06-27 ENCOUNTER — Ambulatory Visit: Payer: Managed Care, Other (non HMO) | Admitting: Internal Medicine

## 2023-06-28 ENCOUNTER — Ambulatory Visit: Payer: Managed Care, Other (non HMO)

## 2023-06-28 ENCOUNTER — Encounter: Payer: Self-pay | Admitting: Internal Medicine

## 2023-07-01 ENCOUNTER — Inpatient Hospital Stay: Payer: Managed Care, Other (non HMO) | Admitting: Internal Medicine

## 2023-07-01 ENCOUNTER — Inpatient Hospital Stay: Payer: Managed Care, Other (non HMO) | Admitting: Oncology

## 2023-07-01 ENCOUNTER — Inpatient Hospital Stay: Payer: Managed Care, Other (non HMO)

## 2023-07-01 ENCOUNTER — Encounter: Payer: Self-pay | Admitting: Oncology

## 2023-07-01 ENCOUNTER — Other Ambulatory Visit: Payer: Self-pay | Admitting: *Deleted

## 2023-07-01 ENCOUNTER — Ambulatory Visit: Payer: Managed Care, Other (non HMO)

## 2023-07-01 DIAGNOSIS — C679 Malignant neoplasm of bladder, unspecified: Secondary | ICD-10-CM | POA: Diagnosis not present

## 2023-07-01 DIAGNOSIS — Z5111 Encounter for antineoplastic chemotherapy: Secondary | ICD-10-CM | POA: Diagnosis not present

## 2023-07-01 LAB — CMP (CANCER CENTER ONLY)
ALT: 15 U/L (ref 0–44)
AST: 19 U/L (ref 15–41)
Albumin: 4.1 g/dL (ref 3.5–5.0)
Alkaline Phosphatase: 46 U/L (ref 38–126)
Anion gap: 5 (ref 5–15)
BUN: 29 mg/dL — ABNORMAL HIGH (ref 8–23)
CO2: 25 mmol/L (ref 22–32)
Calcium: 8.7 mg/dL — ABNORMAL LOW (ref 8.9–10.3)
Chloride: 104 mmol/L (ref 98–111)
Creatinine: 1.1 mg/dL — ABNORMAL HIGH (ref 0.44–1.00)
GFR, Estimated: 57 mL/min — ABNORMAL LOW (ref >60)
Glucose, Bld: 93 mg/dL (ref 70–99)
Potassium: 3.3 mmol/L — ABNORMAL LOW (ref 3.5–5.1)
Sodium: 134 mmol/L — ABNORMAL LOW (ref 135–145)
Total Bilirubin: 0.8 mg/dL (ref 0.3–1.2)
Total Protein: 6.9 g/dL (ref 6.5–8.1)

## 2023-07-01 LAB — MAGNESIUM: Magnesium: 1.8 mg/dL (ref 1.7–2.4)

## 2023-07-01 LAB — CBC WITH DIFFERENTIAL (CANCER CENTER ONLY)
Abs Immature Granulocytes: 0.03 10*3/uL (ref 0.00–0.07)
Basophils Absolute: 0.1 10*3/uL (ref 0.0–0.1)
Basophils Relative: 1 %
Eosinophils Absolute: 0 10*3/uL (ref 0.0–0.5)
Eosinophils Relative: 1 %
HCT: 33.9 % — ABNORMAL LOW (ref 36.0–46.0)
Hemoglobin: 10.9 g/dL — ABNORMAL LOW (ref 12.0–15.0)
Immature Granulocytes: 1 %
Lymphocytes Relative: 47 %
Lymphs Abs: 2.7 10*3/uL (ref 0.7–4.0)
MCH: 32.2 pg (ref 26.0–34.0)
MCHC: 32.2 g/dL (ref 30.0–36.0)
MCV: 100.3 fL — ABNORMAL HIGH (ref 80.0–100.0)
Monocytes Absolute: 0.1 10*3/uL (ref 0.1–1.0)
Monocytes Relative: 2 %
Neutro Abs: 2.9 10*3/uL (ref 1.7–7.7)
Neutrophils Relative %: 48 %
Platelet Count: 247 10*3/uL (ref 150–400)
RBC: 3.38 MIL/uL — ABNORMAL LOW (ref 3.87–5.11)
RDW: 15.5 % (ref 11.5–15.5)
WBC Count: 5.9 10*3/uL (ref 4.0–10.5)
nRBC: 0 % (ref 0.0–0.2)

## 2023-07-01 MED ORDER — DICYCLOMINE HCL 10 MG PO CAPS: 10.0000 mg | ORAL_CAPSULE | Freq: Three times a day (TID) | ORAL | 0 refills | Status: DC | PRN

## 2023-07-01 MED ORDER — OXYCODONE-ACETAMINOPHEN 2.5-325 MG PO TABS: 1.0000 | ORAL_TABLET | Freq: Four times a day (QID) | ORAL | 0 refills | Status: DC | PRN

## 2023-07-01 MED FILL — Fosaprepitant Dimeglumine For IV Infusion 150 MG (Base Eq): INTRAVENOUS | Qty: 5 | Status: AC

## 2023-07-01 MED FILL — Dexamethasone Sodium Phosphate Inj 100 MG/10ML: INTRAMUSCULAR | Qty: 1 | Status: AC

## 2023-07-01 NOTE — Progress Notes (Signed)
[facility]  Telephone:([phone] [phone]  ID: Vesta Mixer OB: 1961-07-03  MR#: [id number]  OVF#:[id number]  Patient Care Team: Alan Ripper as PCP - General (Physician Assistant) Michaelyn Barter, MD as Consulting Physician (Oncology)  CHIEF COMPLAINT: Urothelial carcinoma.  INTERVAL HISTORY: Patient returns to clinic today for further evaluation and consideration of cycle 3, day 8 of cisplatin and gemcitabine tomorrow.  She continues to have abdominal cramping, but otherwise is tolerating her treatments well.  She has no neurologic complaints.  She denies any recent fevers or illnesses.  She has a good appetite and denies weight loss.  She has no chest pain, shortness of breath, cough, or hemoptysis.  She denies any nausea, vomiting, constipation, or diarrhea.  She has no urinary complaints today.  Patient offers no further specific complaints.  REVIEW OF SYSTEMS:   Review of Systems  Constitutional: Negative.  Negative for fever, malaise/fatigue and weight loss.  Respiratory: Negative.  Negative for cough and shortness of breath.   Cardiovascular: Negative.  Negative for chest pain and leg swelling.  Gastrointestinal:  Positive for abdominal pain. Negative for blood in stool, constipation, diarrhea, melena, nausea and vomiting.  Genitourinary: Negative.  Negative for dysuria.  Musculoskeletal: Negative.  Negative for back pain.  Skin: Negative.  Negative for rash.  Neurological: Negative.  Negative for dizziness, focal weakness, weakness and headaches.  Psychiatric/Behavioral: Negative.  The patient is not nervous/anxious.     As per HPI. Otherwise, a complete review of systems is negative.  PAST MEDICAL HISTORY: Past Medical History:  Diagnosis Date   Adopted    Allergic rhinitis    Anemia    h/o   Anxiety    Arthritis    B12 deficiency    Bilateral carotid artery stenosis    Bilateral carotid bruits    Bladder tumor     Cancer (HCC)    bladder cancer   Depression    Gallbladder polyp    GERD (gastroesophageal reflux disease)    Hydronephrosis of left kidney    Migraines    Tobacco abuse    UTI (urinary tract infection)     PAST SURGICAL HISTORY: Past Surgical History:  Procedure Laterality Date   ABDOMINAL HYSTERECTOMY     BLADDER SURGERY     BREAST BIOPSY Left 2014   neg   CYSTOSCOPY W/ URETERAL STENT PLACEMENT Left 04/04/2023   Procedure: CYSTOSCOPY WITH RETROGRADE PYELOGRAM/URETERAL STENT PLACEMENT;  Surgeon: Vanna Scotland, MD;  Location: [facility] ORS;  Service: Urology;  Laterality: Left;   FOOT SURGERY Right    2009   IR IMAGING GUIDED PORT INSERTION  05/03/2023   LAPAROSCOPIC APPENDECTOMY  12/05/2012   NASAL SEPTUM SURGERY     SISTRUNK PROCEDURE  03/03/2012   Thyroglossal duct cyst excision   TONSILLECTOMY     TRANSURETHRAL RESECTION OF BLADDER TUMOR N/A 04/04/2023   Procedure: TRANSURETHRAL RESECTION OF BLADDER TUMOR (TURBT);  Surgeon: Vanna Scotland, MD;  Location: [facility] ORS;  Service: Urology;  Laterality: N/A;    FAMILY HISTORY: Family History  Adopted: Yes    ADVANCED DIRECTIVES (Y/N):  N  HEALTH MAINTENANCE: Social History   Tobacco Use   Smoking status: Former    Current packs/day: 0.25    Average packs/day: 0.3 packs/day for 30.0 years (7.5 ttl pk-yrs)    Types: Cigarettes    Passive exposure: Past   Smokeless tobacco: Never  Vaping Use   Vaping status: Never Used  Substance Use Topics   Alcohol use:  Never   Drug use: Never     Colonoscopy:  PAP:  Bone density:  Lipid panel:  Allergies  Allergen Reactions   Pseudoephedrine Hcl Other (See Comments)    "Jittery"    Current Outpatient Medications  Medication Sig Dispense Refill   estradiol (ESTRACE) 1 MG tablet TAKE 1 TABLET BY MOUTH DAILY 90 tablet 3   lidocaine-prilocaine (EMLA) cream Apply 1 Application topically as needed. 30 g 0   ondansetron (ZOFRAN) 8 MG tablet Take 1 tablet (8 mg total) by mouth  every 8 (eight) hours as needed for up to 30 doses for nausea or vomiting. 30 tablet 1   prochlorperazine (COMPAZINE) 10 MG tablet Take 1 tablet (10 mg total) by mouth every 6 (six) hours as needed for nausea or vomiting. 30 tablet 0   topiramate (TOPAMAX) 50 MG tablet Take 2 tablets (100 mg total) by mouth at bedtime. 180 tablet 3   celecoxib (CELEBREX) 200 MG capsule TAKE 1 CAPSULE BY MOUTH DAILY (Patient not taking: Reported on 05/28/2023) 90 capsule 3   dicyclomine (BENTYL) 10 MG capsule Take 1 capsule (10 mg total) by mouth 3 (three) times daily as needed for up to 30 doses for spasms. 30 capsule 0   docusate sodium (COLACE) 100 MG capsule Take 1 capsule (100 mg total) by mouth 2 (two) times daily. (Patient not taking: Reported on 07/01/2023) 60 capsule 2   HYDROcodone-acetaminophen (NORCO/VICODIN) 5-325 MG tablet Take 1 tablet by mouth every 6 (six) hours as needed for moderate pain. (Patient not taking: Reported on 06/17/2023) 20 tablet 0   ibuprofen (ADVIL) 200 MG tablet Take 800 mg by mouth 2 (two) times daily. (Patient not taking: Reported on 05/07/2023)     oxybutynin (DITROPAN) 5 MG tablet Take 1 tablet (5 mg total) by mouth every 8 (eight) hours as needed for bladder spasms. (Patient not taking: Reported on 07/01/2023) 30 tablet 0   oxycodone-acetaminophen (PERCOCET) 2.5-325 MG tablet Take 1 tablet by mouth every 6 (six) hours as needed for pain. 30 tablet 0   potassium chloride SA (KLOR-CON M) 20 MEQ tablet Take 1 tablet (20 mEq total) by mouth daily for 2 days. (Patient not taking: Reported on 07/01/2023) 2 tablet 0   senna (SENOKOT) 8.6 MG TABS tablet Take 2 tablets (17.2 mg total) by mouth daily as needed for mild constipation. (Patient not taking: Reported on 07/01/2023) 60 tablet 2   trimethoprim (TRIMPEX) 100 MG tablet Take 1 tablet (100 mg total) by mouth daily. (Patient not taking: Reported on 07/01/2023) 30 tablet 11   No current facility-administered medications for this visit.    Facility-Administered Medications Ordered in Other Visits  Medication Dose Route Frequency Provider Last Rate Last Admin   heparin lock flush 100 unit/mL  500 Units Intravenous Once Michaelyn Barter, MD       heparin lock flush 100 unit/mL  500 Units Intravenous Once Michaelyn Barter, MD       heparin lock flush 100 unit/mL  500 Units Intravenous Once Michaelyn Barter, MD       sodium chloride flush (NS) 0.9 % injection 10 mL  10 mL Intravenous Once Michaelyn Barter, MD       sodium chloride flush (NS) 0.9 % injection 10 mL  10 mL Intravenous Once Michaelyn Barter, MD       sodium chloride flush (NS) 0.9 % injection 10 mL  10 mL Intravenous Once Michaelyn Barter, MD        OBJECTIVE: Vitals:   07/01/23 6213  BP: 127/69  Pulse: 80  Resp: 16  Temp: (!) 96.6 F (35.9 C)  SpO2: 100%     Body mass index is 19.97 kg/m.    ECOG FS:1 - Symptomatic but completely ambulatory  General: Well-developed, well-nourished, no acute distress. Eyes: Pink conjunctiva, anicteric sclera. HEENT: Normocephalic, moist mucous membranes. Lungs: No audible wheezing or coughing. Heart: Regular rate and rhythm. Abdomen: Soft, nontender, no obvious distention. Musculoskeletal: No edema, cyanosis, or clubbing. Neuro: Alert, answering all questions appropriately. Cranial nerves grossly intact. Skin: No rashes or petechiae noted. Psych: Normal affect.   LAB RESULTS:  Lab Results  Component Value Date   NA 134 (L) 07/01/2023   K 3.3 (L) 07/01/2023   CL 104 07/01/2023   CO2 25 07/01/2023   GLUCOSE 93 07/01/2023   BUN 29 (H) 07/01/2023   CREATININE 1.10 (H) 07/01/2023   CALCIUM 8.7 (L) 07/01/2023   PROT 6.9 07/01/2023   ALBUMIN 4.1 07/01/2023   AST 19 07/01/2023   ALT 15 07/01/2023   ALKPHOS 46 07/01/2023   BILITOT 0.8 07/01/2023   GFRNONAA 57 (L) 07/01/2023   GFRAA 70 07/28/2020    Lab Results  Component Value Date   WBC 5.9 07/01/2023   NEUTROABS 2.9 07/01/2023   HGB 10.9 (L) 07/01/2023   HCT  33.9 (L) 07/01/2023   MCV 100.3 (H) 07/01/2023   PLT 247 07/01/2023     STUDIES: CT ABDOMEN PELVIS W CONTRAST  Result Date: 06/09/2023 CLINICAL DATA:  Left lower quadrant pain. History of bladder cancer with last chemotherapy 4 days ago. EXAM: CT ABDOMEN AND PELVIS WITH CONTRAST TECHNIQUE: Multidetector CT imaging of the abdomen and pelvis was performed using the standard protocol following bolus administration of intravenous contrast. RADIATION DOSE REDUCTION: This exam was performed according to the departmental dose-optimization program which includes automated exposure control, adjustment of the mA and/or kV according to patient size and/or use of iterative reconstruction technique. CONTRAST:  75mL OMNIPAQUE IOHEXOL 300 MG/ML  SOLN COMPARISON:  03/22/2023 FINDINGS: Lower chest: Visualized lung bases are clear.  Heart is normal size. Hepatobiliary: Liver, gallbladder and biliary tree are normal. Dome of the liver is not completely imaged. Pancreas: Normal. Spleen: Normal. Adrenals/Urinary Tract: Adrenal glands are normal. Kidneys are normal in size. Double-J left-sided internal ureteral stent is in adequate position. Interval improvement in previously seen left-sided hydronephrosis. Right ureter is normal. Postsurgical changes of the left side of the bladder compatible prior bladder tumor excision. Stomach/Bowel: Stomach and small bowel are normal. Previous appendectomy. Colon is unremarkable. Vascular/Lymphatic: Mild-to-moderate calcified plaque over the abdominal aorta which is otherwise normal in caliber. Remaining vascular structures are unremarkable. No evidence of adenopathy. Reproductive: Previous hysterectomy.  Adnexal regions are unchanged. Other: No free fluid or focal inflammatory change. Musculoskeletal: No focal abnormality. IMPRESSION: 1. No acute findings in the abdomen/pelvis. 2. Double-J left-sided internal ureteral stent in adequate position. Interval improvement in previously seen  left-sided hydronephrosis. Postsurgical changes of the left side of the bladder compatible prior bladder tumor excision. 3. Aortic atherosclerosis. Aortic Atherosclerosis (ICD10-I70.0). Electronically Signed   By: Elberta Fortis M.D.   On: 06/09/2023 12:23    ASSESSMENT: Urothelial carcinoma.  PLAN:    Urothelial carcinoma: Proceed with cycle 3, day 8 of treatment today.  Patient is receiving split dose of cisplatin.  Return to clinic in 2 weeks for further evaluation and consideration of cycle 4, day 1. Abdominal cramping: Chronic and unchanged.  Patient was given a refill of her Bentyl and Percocet today. Anemia: Chronic  and unchanged.  Patient's hemoglobin is 10.9 today. Nausea: Continue current antiemetics as prescribed. Renal insufficiency: Mild, monitor. Hyponatremia: Mild, monitor.  Patient expressed understanding and was in agreement with this plan. She also understands that She can call clinic at any time with any questions, concerns, or complaints.    Cancer Staging  Urothelial cancer [facility]) Staging form: Kidney, AJCC 8th Edition - Clinical: Stage III (cT3, cN0, cM0) - Signed by Michaelyn Barter, MD on 05/07/2023 Stage prefix: Initial diagnosis   Jeralyn Ruths, MD   07/01/2023 10:46 AM

## 2023-07-01 NOTE — Progress Notes (Signed)
Asking for a refill of dicyclomine and oxycodone for severe stomach cramps that have been going on since 3rd cycle of chemo. Also having mucous in her diarrhea.

## 2023-07-02 ENCOUNTER — Inpatient Hospital Stay: Payer: Managed Care, Other (non HMO)

## 2023-07-02 VITALS — BP 137/73 | HR 99 | Temp 97.6°F

## 2023-07-02 DIAGNOSIS — Z5111 Encounter for antineoplastic chemotherapy: Secondary | ICD-10-CM | POA: Diagnosis not present

## 2023-07-02 DIAGNOSIS — C679 Malignant neoplasm of bladder, unspecified: Secondary | ICD-10-CM

## 2023-07-02 MED ORDER — SODIUM CHLORIDE 0.9% FLUSH
10.0000 mL | INTRAVENOUS | Status: DC | PRN
  Administered 2023-07-02: 10 mL
  Filled 2023-07-02: qty 10

## 2023-07-02 MED ORDER — SODIUM CHLORIDE 0.9 % IV SOLN
Freq: Once | INTRAVENOUS | Status: AC
  Filled 2023-07-02: qty 250

## 2023-07-02 MED ORDER — SODIUM CHLORIDE 0.9 % IV SOLN
150.0000 mg | Freq: Once | INTRAVENOUS | Status: AC
  Administered 2023-07-02: 150 mg via INTRAVENOUS
  Filled 2023-07-02: qty 150

## 2023-07-02 MED ORDER — SODIUM CHLORIDE 0.9 % IV SOLN
35.0000 mg/m2 | Freq: Once | INTRAVENOUS | Status: AC
  Administered 2023-07-02: 56 mg via INTRAVENOUS
  Filled 2023-07-02: qty 56

## 2023-07-02 MED ORDER — SODIUM CHLORIDE 0.9 % IV SOLN
1000.0000 mg/m2 | Freq: Once | INTRAVENOUS | Status: AC
  Administered 2023-07-02: 1596 mg via INTRAVENOUS
  Filled 2023-07-02: qty 41.98

## 2023-07-02 MED ORDER — HEPARIN SOD (PORK) LOCK FLUSH 100 UNIT/ML IV SOLN
500.0000 [IU] | Freq: Once | INTRAVENOUS | Status: AC | PRN
  Administered 2023-07-02: 500 [IU]
  Filled 2023-07-02: qty 5

## 2023-07-02 MED ORDER — PALONOSETRON HCL INJECTION 0.25 MG/5ML
0.2500 mg | Freq: Once | INTRAVENOUS | Status: AC
  Administered 2023-07-02: 0.25 mg via INTRAVENOUS
  Filled 2023-07-02: qty 5

## 2023-07-02 MED ORDER — POTASSIUM CHLORIDE IN NACL 20-0.9 MEQ/L-% IV SOLN
Freq: Once | INTRAVENOUS | Status: AC
  Filled 2023-07-02: qty 1000

## 2023-07-02 MED ORDER — MAGNESIUM SULFATE 2 GM/50ML IV SOLN
2.0000 g | Freq: Once | INTRAVENOUS | Status: AC
  Administered 2023-07-02: 2 g via INTRAVENOUS
  Filled 2023-07-02: qty 50

## 2023-07-02 MED ORDER — SODIUM CHLORIDE 0.9 % IV SOLN
10.0000 mg | Freq: Once | INTRAVENOUS | Status: AC
  Administered 2023-07-02: 10 mg via INTRAVENOUS
  Filled 2023-07-02: qty 10

## 2023-07-02 NOTE — Patient Instructions (Signed)
[place] [facility]  Discharge Instructions: Thank you for choosing [facility] to provide your oncology and hematology care.  If you have a lab appointment with the Cancer Center, please go directly to the Cancer Center and check in at the registration area.  Wear comfortable clothing and clothing appropriate for easy access to any Portacath or PICC line.   We strive to give you quality time with your provider. You may need to reschedule your appointment if you arrive late (15 or more minutes).  Arriving late affects you and other patients whose appointments are after yours.  Also, if you miss three or more appointments without notifying the office, you may be dismissed from the clinic at the provider's discretion.      For prescription refill requests, have your pharmacy contact our office and allow 72 hours for refills to be completed.    Today you received the following chemotherapy and/or immunotherapy agents : CISplatin/ Gemcitabine  To help prevent nausea and vomiting after your treatment, we encourage you to take your nausea medication as directed.  BELOW ARE SYMPTOMS THAT SHOULD BE REPORTED IMMEDIATELY: *FEVER GREATER THAN 100.4 F (38 C) OR HIGHER *CHILLS OR SWEATING *NAUSEA AND VOMITING THAT IS NOT CONTROLLED WITH YOUR NAUSEA MEDICATION *UNUSUAL SHORTNESS OF BREATH *UNUSUAL BRUISING OR BLEEDING *URINARY PROBLEMS (pain or burning when urinating, or frequent urination) *BOWEL PROBLEMS (unusual diarrhea, constipation, pain near the anus) TENDERNESS IN MOUTH AND THROAT WITH OR WITHOUT PRESENCE OF ULCERS (sore throat, sores in mouth, or a toothache) UNUSUAL RASH, SWELLING OR PAIN  UNUSUAL VAGINAL DISCHARGE OR ITCHING   Items with * indicate a potential emergency and should be followed up as soon as possible or go to the Emergency Department if any problems should occur.  Please show the CHEMOTHERAPY ALERT CARD or IMMUNOTHERAPY ALERT CARD at  check-in to the Emergency Department and triage nurse.  Should you have questions after your visit or need to cancel or reschedule your appointment, please contact [place] [facility]  [phone] and follow the prompts.  Office hours are 8:00 a.m. to 4:30 p.m. Monday - Friday. Please note that voicemails left after 4:00 p.m. may not be returned until the following business day.  We are closed weekends and major holidays. You have access to a nurse at all times for urgent questions. Please call the main number to the clinic [phone] and follow the prompts.  For any non-urgent questions, you may also contact your provider using MyChart. We now offer e-Visits for anyone 62 and older to request care online for non-urgent symptoms. For details visit [email or web address].   Also download the MyChart app! Go to the app store, search "MyChart", open the app, select [place], and log in with your MyChart username and password.

## 2023-07-02 NOTE — Progress Notes (Signed)
Ok per MD run post fluids with chemo

## 2023-07-04 ENCOUNTER — Telehealth: Payer: Self-pay | Admitting: *Deleted

## 2023-07-04 ENCOUNTER — Encounter: Payer: Self-pay | Admitting: Internal Medicine

## 2023-07-04 ENCOUNTER — Inpatient Hospital Stay: Payer: Managed Care, Other (non HMO)

## 2023-07-04 DIAGNOSIS — C679 Malignant neoplasm of bladder, unspecified: Secondary | ICD-10-CM

## 2023-07-04 DIAGNOSIS — Z5111 Encounter for antineoplastic chemotherapy: Secondary | ICD-10-CM | POA: Diagnosis not present

## 2023-07-04 MED ORDER — PEGFILGRASTIM-CBQV 6 MG/0.6ML ~~LOC~~ SOSY
6.0000 mg | PREFILLED_SYRINGE | Freq: Once | SUBCUTANEOUS | Status: AC
  Administered 2023-07-04: 6 mg via SUBCUTANEOUS
  Filled 2023-07-04: qty 0.6

## 2023-07-04 NOTE — Telephone Encounter (Signed)
Patient no showed for the udenyca injection today. Attempted to reach patient and her husband. No answer. Unable to leave a voice mail on pt's husband's phone. Vm box not set up. Mychart msg also sent to patient.

## 2023-07-04 NOTE — Telephone Encounter (Signed)
2nd attempt to reach patient. Rn spoke with patient - she has been very sick and nauseated after her chemotherapy despite taking her zofran. She will make every effort to get to the cancer center in the next 45 mins.

## 2023-07-05 ENCOUNTER — Encounter: Payer: Self-pay | Admitting: Nurse Practitioner

## 2023-07-05 ENCOUNTER — Encounter: Payer: Self-pay | Admitting: Internal Medicine

## 2023-07-08 ENCOUNTER — Other Ambulatory Visit: Payer: Self-pay | Admitting: *Deleted

## 2023-07-08 DIAGNOSIS — C679 Malignant neoplasm of bladder, unspecified: Secondary | ICD-10-CM

## 2023-07-09 ENCOUNTER — Inpatient Hospital Stay: Payer: Managed Care, Other (non HMO)

## 2023-07-09 VITALS — BP 108/68 | HR 75 | Temp 96.5°F | Resp 20

## 2023-07-09 DIAGNOSIS — Z5111 Encounter for antineoplastic chemotherapy: Secondary | ICD-10-CM | POA: Diagnosis not present

## 2023-07-09 DIAGNOSIS — C679 Malignant neoplasm of bladder, unspecified: Secondary | ICD-10-CM

## 2023-07-09 LAB — CBC WITH DIFFERENTIAL (CANCER CENTER ONLY)
Abs Immature Granulocytes: 0.11 10*3/uL — ABNORMAL HIGH (ref 0.00–0.07)
Basophils Absolute: 0.1 10*3/uL (ref 0.0–0.1)
Basophils Relative: 1 %
Eosinophils Absolute: 0.1 10*3/uL (ref 0.0–0.5)
Eosinophils Relative: 1 %
HCT: 23.9 % — ABNORMAL LOW (ref 36.0–46.0)
Hemoglobin: 8 g/dL — ABNORMAL LOW (ref 12.0–15.0)
Immature Granulocytes: 1 %
Lymphocytes Relative: 19 %
Lymphs Abs: 2.5 10*3/uL (ref 0.7–4.0)
MCH: 33.2 pg (ref 26.0–34.0)
MCHC: 33.5 g/dL (ref 30.0–36.0)
MCV: 99.2 fL (ref 80.0–100.0)
Monocytes Absolute: 0.6 10*3/uL (ref 0.1–1.0)
Monocytes Relative: 4 %
Neutro Abs: 10.1 10*3/uL — ABNORMAL HIGH (ref 1.7–7.7)
Neutrophils Relative %: 74 %
Platelet Count: 17 10*3/uL — ABNORMAL LOW (ref 150–400)
RBC: 2.41 MIL/uL — ABNORMAL LOW (ref 3.87–5.11)
RDW: 14.8 % (ref 11.5–15.5)
Smear Review: NORMAL
WBC Count: 13.5 10*3/uL — ABNORMAL HIGH (ref 4.0–10.5)
nRBC: 0 % (ref 0.0–0.2)

## 2023-07-09 LAB — CMP (CANCER CENTER ONLY)
ALT: 10 U/L (ref 0–44)
AST: 16 U/L (ref 15–41)
Albumin: 3.7 g/dL (ref 3.5–5.0)
Alkaline Phosphatase: 86 U/L (ref 38–126)
Anion gap: 6 (ref 5–15)
BUN: 22 mg/dL (ref 8–23)
CO2: 28 mmol/L (ref 22–32)
Calcium: 8.6 mg/dL — ABNORMAL LOW (ref 8.9–10.3)
Chloride: 99 mmol/L (ref 98–111)
Creatinine: 0.78 mg/dL (ref 0.44–1.00)
GFR, Estimated: >60 mL/min (ref >60)
Glucose, Bld: 104 mg/dL — ABNORMAL HIGH (ref 70–99)
Potassium: 3.6 mmol/L (ref 3.5–5.1)
Sodium: 133 mmol/L — ABNORMAL LOW (ref 135–145)
Total Bilirubin: 0.5 mg/dL (ref 0.3–1.2)
Total Protein: 6 g/dL — ABNORMAL LOW (ref 6.5–8.1)

## 2023-07-09 LAB — MAGNESIUM: Magnesium: 1.8 mg/dL (ref 1.7–2.4)

## 2023-07-09 MED ORDER — SODIUM CHLORIDE 0.9% FLUSH
10.0000 mL | Freq: Once | INTRAVENOUS | Status: AC | PRN
  Administered 2023-07-09: 10 mL
  Filled 2023-07-09: qty 10

## 2023-07-09 MED ORDER — SODIUM CHLORIDE 0.9 % IV SOLN
INTRAVENOUS | Status: DC | PRN
  Filled 2023-07-09: qty 250

## 2023-07-09 MED ORDER — HEPARIN SOD (PORK) LOCK FLUSH 100 UNIT/ML IV SOLN
500.0000 [IU] | Freq: Once | INTRAVENOUS | Status: AC | PRN
  Administered 2023-07-09: 500 [IU]
  Filled 2023-07-09: qty 5

## 2023-07-11 ENCOUNTER — Encounter: Payer: Self-pay | Admitting: Internal Medicine

## 2023-07-15 ENCOUNTER — Inpatient Hospital Stay: Payer: Managed Care, Other (non HMO) | Admitting: Internal Medicine

## 2023-07-15 ENCOUNTER — Inpatient Hospital Stay: Payer: Managed Care, Other (non HMO)

## 2023-07-15 ENCOUNTER — Encounter: Payer: Self-pay | Admitting: Internal Medicine

## 2023-07-15 VITALS — BP 127/75 | HR 80 | Temp 97.6°F | Wt 121.2 lb

## 2023-07-15 DIAGNOSIS — D6959 Other secondary thrombocytopenia: Secondary | ICD-10-CM | POA: Insufficient documentation

## 2023-07-15 DIAGNOSIS — T451X5A Adverse effect of antineoplastic and immunosuppressive drugs, initial encounter: Secondary | ICD-10-CM

## 2023-07-15 DIAGNOSIS — C679 Malignant neoplasm of bladder, unspecified: Secondary | ICD-10-CM | POA: Diagnosis not present

## 2023-07-15 DIAGNOSIS — Z5111 Encounter for antineoplastic chemotherapy: Secondary | ICD-10-CM | POA: Diagnosis not present

## 2023-07-15 LAB — CMP (CANCER CENTER ONLY)
ALT: 10 U/L (ref 0–44)
AST: 18 U/L (ref 15–41)
Albumin: 3.9 g/dL (ref 3.5–5.0)
Alkaline Phosphatase: 63 U/L (ref 38–126)
Anion gap: 9 (ref 5–15)
BUN: 21 mg/dL (ref 8–23)
CO2: 27 mmol/L (ref 22–32)
Calcium: 8.7 mg/dL — ABNORMAL LOW (ref 8.9–10.3)
Chloride: 100 mmol/L (ref 98–111)
Creatinine: 0.97 mg/dL (ref 0.44–1.00)
GFR, Estimated: >60 mL/min (ref >60)
Glucose, Bld: 92 mg/dL (ref 70–99)
Potassium: 3.6 mmol/L (ref 3.5–5.1)
Sodium: 136 mmol/L (ref 135–145)
Total Bilirubin: 0.4 mg/dL (ref 0.3–1.2)
Total Protein: 6.5 g/dL (ref 6.5–8.1)

## 2023-07-15 LAB — CBC WITH DIFFERENTIAL (CANCER CENTER ONLY)
Abs Immature Granulocytes: 0.11 10*3/uL — ABNORMAL HIGH (ref 0.00–0.07)
Basophils Absolute: 0.1 10*3/uL (ref 0.0–0.1)
Basophils Relative: 1 %
Eosinophils Absolute: 0.1 10*3/uL (ref 0.0–0.5)
Eosinophils Relative: 2 %
HCT: 27.8 % — ABNORMAL LOW (ref 36.0–46.0)
Hemoglobin: 9 g/dL — ABNORMAL LOW (ref 12.0–15.0)
Immature Granulocytes: 2 %
Lymphocytes Relative: 36 %
Lymphs Abs: 2.1 10*3/uL (ref 0.7–4.0)
MCH: 34.4 pg — ABNORMAL HIGH (ref 26.0–34.0)
MCHC: 32.4 g/dL (ref 30.0–36.0)
MCV: 106.1 fL — ABNORMAL HIGH (ref 80.0–100.0)
Monocytes Absolute: 0.6 10*3/uL (ref 0.1–1.0)
Monocytes Relative: 11 %
Neutro Abs: 2.8 10*3/uL (ref 1.7–7.7)
Neutrophils Relative %: 48 %
Platelet Count: 89 10*3/uL — ABNORMAL LOW (ref 150–400)
RBC: 2.62 MIL/uL — ABNORMAL LOW (ref 3.87–5.11)
RDW: 17.5 % — ABNORMAL HIGH (ref 11.5–15.5)
WBC Count: 5.7 10*3/uL (ref 4.0–10.5)
nRBC: 0 % (ref 0.0–0.2)

## 2023-07-15 LAB — MAGNESIUM: Magnesium: 1.8 mg/dL (ref 1.7–2.4)

## 2023-07-15 MED FILL — Dexamethasone Sodium Phosphate Inj 100 MG/10ML: INTRAMUSCULAR | Qty: 1 | Status: AC

## 2023-07-15 MED FILL — Fosaprepitant Dimeglumine For IV Infusion 150 MG (Base Eq): INTRAVENOUS | Qty: 5 | Status: AC

## 2023-07-15 NOTE — Progress Notes (Signed)
[facility] CONSULT NOTE  Patient Care Team: McDonough, Renard Matter as PCP - General (Physician Assistant) Michaelyn Barter, MD as Consulting Physician (Oncology)  REFERRING PROVIDER: Dr. Apolinar Junes  REASON FOR REFFERAL: Muscle invasive bladder cancer  CANCER STAGING   Cancer Staging  Urothelial cancer [facility]) Staging form: Kidney, AJCC 8th Edition - Clinical: Stage III (cT3, cN0, cM0) - Signed by Michaelyn Barter, MD on 05/07/2023 Stage prefix: Initial diagnosis   ASSESSMENT & PLAN:  Barbara [name] 63 y.o. female with pmh of anxiety, GERD, right carotid artery stenosis was referred to medical oncology for new diagnosis of muscle invasive urothelial cancer.  # Muscle invasive urothelial carcinoma, cT3N0M0 # Moderate left hydroureteronephrosis s/p left ureteral stent - Patient developed urinary symptoms around September or October 2023.  Was treated with multiple rounds of antibiotics with no improvement.  She was then referred to Dr. Apolinar Junes for further evaluation.  - CTU from 03/22/2023 showed moderate left hydroureteronephrosis to the UVJ, marked leftward asymmetric wall thickening of the urinary bladder measuring 18 mm in maximal thickness which extends over the UVJ.  Perivesical stranding soft tissue.  No evidence of upper tract or abdominopelvic metastatic disease.  - s/p TURBT with left ureteral stent placement by Dr. Apolinar Junes.  IntraOp findings showed large irregular mucosal submucosal extensive tumor from the left dome, left lateral bladder wall majority of trigone including the left UO and bladder neck.  Measures at least 5 x 5 cm. Pathology showed invasive high-grade urothelial carcinoma.  There is some discrepancy.  Lower down in the report mentions low-grade.  Invades muscularis propria.  -CT chest without contrast did not show any evidence of metastatic disease.  Showed mild COPD changes, there is aortic atherosclerosis as well as atherosclerosis of great vessels of  mediastinum and coronary arteries including calcified atherosclerotic plaque in the left main, left anterior descending and left circumflex coronary artery.  Calcifications of the aortic valve.  -Seen prior to cycle 4-day 1 of cisplatin and gemcitabine.  Platelets 89 today.  Considering this is day 1 and day 8 regimen would favor platelets to be 100 K or above before proceeding.  Will defer treatment by a week.  # Abdominal cramp # Mucus discharge -Patient presented to ED on 06/09/2023.  CT abdomen pelvis did not show any acute changes. -Symptoms have resolved.  # Chemotherapy induced anemia -Hemoglobin 9.  Continue to monitor.  # Chemo induced nausea -Continue with dexamethasone for 3 days post chemo.  Continue with Compazine and Zofran as needed  # Constipation-does not like MiraLAX.  Sent prescription for Colace and senna  # Acid reflux/gas/bloating-advised to take OTC simethicone, Tums, Pepcid as needed.  # Coronary atherosclerosis -Detected as incidental finding on CT done for staging purposes. Her last lipid panel showed LDL of 138.  Advised to discuss with primary about role of statin.  Rest per primary.  # Mild COPD changes -On CT's chest.  Prior history of smoking.  Asymptomatic.  # Access-port placed on 05/03/2023  Orders Placed This Encounter  Procedures   CBC with Differential (Cancer Center Only)    Standing Status:   Future    Standing Expiration Date:   07/21/2024   CMP (Cancer Center only)    Standing Status:   Future    Standing Expiration Date:   07/21/2024   Magnesium    Standing Status:   Future    Standing Expiration Date:   07/21/2024   RTC 1 for MD visit, labs, cycle 4-day  1 of cisplatin and gemcitabine.  The total time spent in the appointment was 30 minutes encounter with patients including review of chart and various tests results, discussions about plan of care and coordination of care plan   All questions were answered. The patient knows to call the  clinic with any problems, questions or concerns. No barriers to learning was detected.  Michaelyn Barter, MD 9/23/20241:06 PM   HISTORY OF PRESENTING ILLNESS:  Barbara [name] 62 y.o. female with pmh of pmh of anxiety, GERD, right carotid artery stenosis was referred to medical oncology for new diagnosis of muscle invasive urothelial cancer.  Patient develops urinary symptoms around September or October 2023.  Was treated with multiple rounds of antibiotics with no improvement.  She was then referred to Dr. Apolinar Junes for further evaluation.  CTU from 03/22/2023 showed moderate left hydroureteronephrosis to the UVJ, marked leftward asymmetric wall thickening of the urinary bladder measuring 18 mm in maximal thickness which extends over the UVJ.  Perivesical stranding soft tissue.  No evidence of upper tract or abdominopelvic metastatic disease.  s/p TURBT with left ureteral stent placement by Dr. Apolinar Junes.  IntraOp findings showed large irregular mucosal submucosal extensive tumor from the left dome, left lateral bladder wall majority of trigone including the left UO and bladder neck.  Measures at least 5 x 5 cm.  Pathology showed invasive high-grade urothelial carcinoma.  There is some discrepancy.  Lower down in the report mentions low-grade.  Invades muscularis propria.  Interval history Patient was seen today prior to cycle 4 day 1 of cisplatin and gemcitabine. Doing well.  Appetite is fair.  Does not have taste buds.  Abdominal pain has resolved.  Bowel movements are returning to normal.  Denies any neuropathy.  Denies any tinnitus.  Feels that she is hearing her heartbeat in her ears.  Was last week.  Has resolved now.  I have reviewed her chart and materials related to her cancer extensively and collaborated history with the patient. Summary of oncologic history is as follows: Oncology History  Urothelial cancer (HCC)  04/12/2023 Initial Diagnosis   Urothelial cancer (HCC)   04/12/2023  Cancer Staging   Staging form: Kidney, AJCC 8th Edition - Clinical: Stage III (cT3, cN0, cM0) - Signed by Michaelyn Barter, MD on 05/07/2023 Stage prefix: Initial diagnosis   Malignant neoplasm of urinary bladder (HCC)  04/22/2023 Initial Diagnosis   Malignant neoplasm of urinary bladder (HCC)   05/07/2023 -  Chemotherapy   Patient is on Treatment Plan : BLADDER Gemcitabine D1,8 + Cisplatin (split dose) D1,8 q21d x 4 cycles       MEDICAL HISTORY:  Past Medical History:  Diagnosis Date   Adopted    Allergic rhinitis    Anemia    h/o   Anxiety    Arthritis    B12 deficiency    Bilateral carotid artery stenosis    Bilateral carotid bruits    Bladder tumor    Cancer (HCC)    bladder cancer   Depression    Gallbladder polyp    GERD (gastroesophageal reflux disease)    Hydronephrosis of left kidney    Migraines    Tobacco abuse    UTI (urinary tract infection)     SURGICAL HISTORY: Past Surgical History:  Procedure Laterality Date   ABDOMINAL HYSTERECTOMY     BLADDER SURGERY     BREAST BIOPSY Left 2014   neg   CYSTOSCOPY W/ URETERAL STENT PLACEMENT Left 04/04/2023   Procedure: CYSTOSCOPY WITH  RETROGRADE PYELOGRAM/URETERAL STENT PLACEMENT;  Surgeon: Vanna Scotland, MD;  Location: [facility] ORS;  Service: Urology;  Laterality: Left;   FOOT SURGERY Right    2009   IR IMAGING GUIDED PORT INSERTION  05/03/2023   LAPAROSCOPIC APPENDECTOMY  12/05/2012   NASAL SEPTUM SURGERY     SISTRUNK PROCEDURE  03/03/2012   Thyroglossal duct cyst excision   TONSILLECTOMY     TRANSURETHRAL RESECTION OF BLADDER TUMOR N/A 04/04/2023   Procedure: TRANSURETHRAL RESECTION OF BLADDER TUMOR (TURBT);  Surgeon: Vanna Scotland, MD;  Location: [facility] ORS;  Service: Urology;  Laterality: N/A;    SOCIAL HISTORY: Social History   Socioeconomic History   Marital status: Married    Spouse name: Not on file   Number of children: Not on file   Years of education: Not on file   Highest education level: Not on  file  Occupational History   Not on file  Tobacco Use   Smoking status: Former    Current packs/day: 0.25    Average packs/day: 0.3 packs/day for 30.0 years (7.5 ttl pk-yrs)    Types: Cigarettes    Passive exposure: Past   Smokeless tobacco: Never  Vaping Use   Vaping status: Never Used  Substance and Sexual Activity   Alcohol use: Never   Drug use: Never   Sexual activity: Not on file  Other Topics Concern   Not on file  Social History Narrative   Not on file   Social Determinants of Health   Financial Resource Strain: Not on file  Food Insecurity: No Food Insecurity (04/12/2023)   Hunger Vital Sign    Worried About Running Out of Food in the Last Year: Never true    Ran Out of Food in the Last Year: Never true  Transportation Needs: No Transportation Needs (04/12/2023)   PRAPARE - Administrator, Civil Service (Medical): No    Lack of Transportation (Non-Medical): No  Physical Activity: Not on file  Stress: Not on file  Social Connections: Not on file  Intimate Partner Violence: Not At Risk (04/12/2023)   Humiliation, Afraid, Rape, and Kick questionnaire    Fear of Current or Ex-Partner: No    Emotionally Abused: No    Physically Abused: No    Sexually Abused: No    FAMILY HISTORY: Family History  Adopted: Yes    ALLERGIES:  is allergic to pseudoephedrine hcl.  MEDICATIONS:  Current Outpatient Medications  Medication Sig Dispense Refill   dicyclomine (BENTYL) 10 MG capsule Take 1 capsule (10 mg total) by mouth 3 (three) times daily as needed for up to 30 doses for spasms. 30 capsule 0   estradiol (ESTRACE) 1 MG tablet TAKE 1 TABLET BY MOUTH DAILY 90 tablet 3   lidocaine-prilocaine (EMLA) cream Apply 1 Application topically as needed. 30 g 0   ondansetron (ZOFRAN) 8 MG tablet Take 1 tablet (8 mg total) by mouth every 8 (eight) hours as needed for up to 30 doses for nausea or vomiting. 30 tablet 1   oxycodone-acetaminophen (PERCOCET) 2.5-325 MG tablet  Take 1 tablet by mouth every 6 (six) hours as needed for pain. 30 tablet 0   prochlorperazine (COMPAZINE) 10 MG tablet Take 1 tablet (10 mg total) by mouth every 6 (six) hours as needed for nausea or vomiting. 30 tablet 0   topiramate (TOPAMAX) 50 MG tablet Take 2 tablets (100 mg total) by mouth at bedtime. 180 tablet 3   celecoxib (CELEBREX) 200 MG capsule TAKE 1 CAPSULE BY MOUTH  DAILY (Patient not taking: Reported on 05/28/2023) 90 capsule 3   docusate sodium (COLACE) 100 MG capsule Take 1 capsule (100 mg total) by mouth 2 (two) times daily. (Patient not taking: Reported on 07/01/2023) 60 capsule 2   HYDROcodone-acetaminophen (NORCO/VICODIN) 5-325 MG tablet Take 1 tablet by mouth every 6 (six) hours as needed for moderate pain. (Patient not taking: Reported on 06/17/2023) 20 tablet 0   ibuprofen (ADVIL) 200 MG tablet Take 800 mg by mouth 2 (two) times daily. (Patient not taking: Reported on 05/07/2023)     oxybutynin (DITROPAN) 5 MG tablet Take 1 tablet (5 mg total) by mouth every 8 (eight) hours as needed for bladder spasms. (Patient not taking: Reported on 07/01/2023) 30 tablet 0   potassium chloride SA (KLOR-CON M) 20 MEQ tablet Take 1 tablet (20 mEq total) by mouth daily for 2 days. (Patient not taking: Reported on 07/01/2023) 2 tablet 0   senna (SENOKOT) 8.6 MG TABS tablet Take 2 tablets (17.2 mg total) by mouth daily as needed for mild constipation. (Patient not taking: Reported on 07/01/2023) 60 tablet 2   trimethoprim (TRIMPEX) 100 MG tablet Take 1 tablet (100 mg total) by mouth daily. (Patient not taking: Reported on 07/01/2023) 30 tablet 11   No current facility-administered medications for this visit.   Facility-Administered Medications Ordered in Other Visits  Medication Dose Route Frequency Provider Last Rate Last Admin   heparin lock flush 100 unit/mL  500 Units Intravenous Once Michaelyn Barter, MD       heparin lock flush 100 unit/mL  500 Units Intravenous Once Michaelyn Barter, MD       heparin  lock flush 100 unit/mL  500 Units Intravenous Once Michaelyn Barter, MD       sodium chloride flush (NS) 0.9 % injection 10 mL  10 mL Intravenous Once Michaelyn Barter, MD       sodium chloride flush (NS) 0.9 % injection 10 mL  10 mL Intravenous Once Michaelyn Barter, MD       sodium chloride flush (NS) 0.9 % injection 10 mL  10 mL Intravenous Once Michaelyn Barter, MD        REVIEW OF SYSTEMS:   Pertinent information mentioned in HPI All other systems were reviewed with the patient and are negative.  PHYSICAL EXAMINATION: ECOG PERFORMANCE STATUS: 1 - Symptomatic but completely ambulatory  Vitals:   07/15/23 0956  BP: 127/75  Pulse: 80  Temp: 97.6 F (36.4 C)  SpO2: 100%    Filed Weights   07/15/23 0956  Weight: 121 lb 3.2 oz (55 kg)     GENERAL:alert, no distress and comfortable SKIN: skin color, texture, turgor are normal, no rashes or significant lesions EYES: normal, conjunctiva are pink and non-injected, sclera clear OROPHARYNX:no exudate, no erythema and lips, buccal mucosa, and tongue normal  NECK: supple, thyroid normal size, non-tender, without nodularity LYMPH:  no palpable lymphadenopathy in the cervical, axillary or inguinal LUNGS: clear to auscultation and percussion with normal breathing effort HEART: regular rate & rhythm and no murmurs and no lower extremity edema ABDOMEN:abdomen soft, non-tender and normal bowel sounds Musculoskeletal:no cyanosis of digits and no clubbing  PSYCH: alert & oriented x 3 with fluent speech NEURO: no focal motor/sensory deficits  LABORATORY DATA:  I have reviewed the data as listed Lab Results  Component Value Date   WBC 5.7 07/15/2023   HGB 9.0 (L) 07/15/2023   HCT 27.8 (L) 07/15/2023   MCV 106.1 (H) 07/15/2023   PLT 89 (L) 07/15/2023  Recent Labs    07/01/23 0940 07/09/23 1300 07/15/23 0942  NA 134* 133* 136  K 3.3* 3.6 3.6  CL 104 99 100  CO2 25 28 27   GLUCOSE 93 104* 92  BUN 29* 22 21  CREATININE 1.10* 0.78  0.97  CALCIUM 8.7* 8.6* 8.7*  GFRNONAA 57* >60 >60  PROT 6.9 6.0* 6.5  ALBUMIN 4.1 3.7 3.9  AST 19 16 18   ALT 15 10 10   ALKPHOS 46 86 63  BILITOT 0.8 0.5 0.4    RADIOGRAPHIC STUDIES: I have personally reviewed the radiological images as listed and agreed with the findings in the report. No results found.

## 2023-07-15 NOTE — Progress Notes (Signed)
Patient states that her stomach pain has went away almost completely and her stool is almost back to normal. She has felt good for the past week.

## 2023-07-16 ENCOUNTER — Inpatient Hospital Stay: Payer: Managed Care, Other (non HMO)

## 2023-07-22 ENCOUNTER — Inpatient Hospital Stay (HOSPITAL_BASED_OUTPATIENT_CLINIC_OR_DEPARTMENT_OTHER): Payer: Managed Care, Other (non HMO) | Admitting: Internal Medicine

## 2023-07-22 ENCOUNTER — Inpatient Hospital Stay: Payer: Managed Care, Other (non HMO)

## 2023-07-22 ENCOUNTER — Ambulatory Visit: Payer: Managed Care, Other (non HMO) | Admitting: Internal Medicine

## 2023-07-22 ENCOUNTER — Other Ambulatory Visit: Payer: Managed Care, Other (non HMO)

## 2023-07-22 VITALS — BP 115/66 | HR 80 | Temp 97.3°F | Wt 121.4 lb

## 2023-07-22 DIAGNOSIS — C679 Malignant neoplasm of bladder, unspecified: Secondary | ICD-10-CM | POA: Diagnosis not present

## 2023-07-22 DIAGNOSIS — Z5111 Encounter for antineoplastic chemotherapy: Secondary | ICD-10-CM

## 2023-07-22 DIAGNOSIS — T451X5A Adverse effect of antineoplastic and immunosuppressive drugs, initial encounter: Secondary | ICD-10-CM

## 2023-07-22 DIAGNOSIS — D6959 Other secondary thrombocytopenia: Secondary | ICD-10-CM | POA: Diagnosis not present

## 2023-07-22 LAB — CBC WITH DIFFERENTIAL (CANCER CENTER ONLY)
Abs Immature Granulocytes: 0.13 10*3/uL — ABNORMAL HIGH (ref 0.00–0.07)
Basophils Absolute: 0.1 10*3/uL (ref 0.0–0.1)
Basophils Relative: 1 %
Eosinophils Absolute: 0.1 10*3/uL (ref 0.0–0.5)
Eosinophils Relative: 2 %
HCT: 31.6 % — ABNORMAL LOW (ref 36.0–46.0)
Hemoglobin: 10.3 g/dL — ABNORMAL LOW (ref 12.0–15.0)
Immature Granulocytes: 2 %
Lymphocytes Relative: 36 %
Lymphs Abs: 2.2 10*3/uL (ref 0.7–4.0)
MCH: 34.4 pg — ABNORMAL HIGH (ref 26.0–34.0)
MCHC: 32.6 g/dL (ref 30.0–36.0)
MCV: 105.7 fL — ABNORMAL HIGH (ref 80.0–100.0)
Monocytes Absolute: 0.8 10*3/uL (ref 0.1–1.0)
Monocytes Relative: 14 %
Neutro Abs: 2.8 10*3/uL (ref 1.7–7.7)
Neutrophils Relative %: 45 %
Platelet Count: 369 10*3/uL (ref 150–400)
RBC: 2.99 MIL/uL — ABNORMAL LOW (ref 3.87–5.11)
RDW: 17.9 % — ABNORMAL HIGH (ref 11.5–15.5)
WBC Count: 6.1 10*3/uL (ref 4.0–10.5)
nRBC: 0 % (ref 0.0–0.2)

## 2023-07-22 LAB — CMP (CANCER CENTER ONLY)
ALT: 10 U/L (ref 0–44)
AST: 21 U/L (ref 15–41)
Albumin: 4.3 g/dL (ref 3.5–5.0)
Alkaline Phosphatase: 57 U/L (ref 38–126)
Anion gap: 7 (ref 5–15)
BUN: 24 mg/dL — ABNORMAL HIGH (ref 8–23)
CO2: 28 mmol/L (ref 22–32)
Calcium: 9.3 mg/dL (ref 8.9–10.3)
Chloride: 102 mmol/L (ref 98–111)
Creatinine: 1.03 mg/dL — ABNORMAL HIGH (ref 0.44–1.00)
GFR, Estimated: >60 mL/min (ref >60)
Glucose, Bld: 93 mg/dL (ref 70–99)
Potassium: 4 mmol/L (ref 3.5–5.1)
Sodium: 137 mmol/L (ref 135–145)
Total Bilirubin: 0.4 mg/dL (ref 0.3–1.2)
Total Protein: 7 g/dL (ref 6.5–8.1)

## 2023-07-22 LAB — MAGNESIUM: Magnesium: 1.8 mg/dL (ref 1.7–2.4)

## 2023-07-22 MED FILL — Dexamethasone Sodium Phosphate Inj 100 MG/10ML: INTRAMUSCULAR | Qty: 1 | Status: AC

## 2023-07-22 MED FILL — Fosaprepitant Dimeglumine For IV Infusion 150 MG (Base Eq): INTRAVENOUS | Qty: 5 | Status: AC

## 2023-07-22 NOTE — Progress Notes (Signed)
[facility] CONSULT NOTE  Patient Care Team: McDonough, Renard Matter as PCP - General (Physician Assistant) Michaelyn Barter, MD as Consulting Physician (Oncology)  REASON FOR REFFERAL: Muscle invasive bladder cancer  CANCER STAGING   Cancer Staging  Urothelial cancer [facility]) Staging form: Kidney, AJCC 8th Edition - Clinical: Stage III (cT3, cN0, cM0) - Signed by Michaelyn Barter, MD on 05/07/2023 Stage prefix: Initial diagnosis   ASSESSMENT & PLAN:  Barbara [name] 62 y.o. female with pmh of anxiety, GERD, right carotid artery stenosis was referred to medical oncology for new diagnosis of muscle invasive urothelial cancer.  # Muscle invasive urothelial carcinoma, cT3N0M0 # Moderate left hydroureteronephrosis s/p left ureteral stent - Patient developed urinary symptoms around September or October 2023.  Was treated with multiple rounds of antibiotics with no improvement.  She was then referred to Dr. Apolinar Junes for further evaluation.  - CTU from 03/22/2023 showed moderate left hydroureteronephrosis to the UVJ, marked leftward asymmetric wall thickening of the urinary bladder measuring 18 mm in maximal thickness which extends over the UVJ.  Perivesical stranding soft tissue.  No evidence of upper tract or abdominopelvic metastatic disease.  - s/p TURBT with left ureteral stent placement by Dr. Apolinar Junes.  IntraOp findings showed large irregular mucosal submucosal extensive tumor from the left dome, left lateral bladder wall majority of trigone including the left UO and bladder neck.  Measures at least 5 x 5 cm. Pathology showed invasive high-grade urothelial carcinoma.  There is some discrepancy.  Lower down in the report mentions low-grade.  Invades muscularis propria.  -CT chest without contrast did not show any evidence of metastatic disease.  Showed mild COPD changes, there is aortic atherosclerosis as well as atherosclerosis of great vessels of mediastinum and coronary arteries  including calcified atherosclerotic plaque in the left main, left anterior descending and left circumflex coronary artery.  Calcifications of the aortic valve.  -Patient seen today prior to cycle 4-day 1 of cisplatin and gemcitabine.  Platelets have improved from 89-3 69.  Other labs look unremarkable.  Will proceed with the treatment tomorrow.  I will update Dr. Berneice Heinrich she will be completing her chemotherapy next week for surgery scheduling.  # Abdominal cramp # Mucus discharge -Patient presented to ED on 06/09/2023.  CT abdomen pelvis did not show any acute changes. -Symptoms have improved.  # Chemotherapy induced anemia -Hemoglobin 10.  Continue to monitor.  # Chemo induced nausea -Continue with dexamethasone for 3 days post chemo.  Continue with Compazine and Zofran as needed  # Constipation-does not like MiraLAX.  Sent prescription for Colace and senna  # Acid reflux/gas/bloating-advised to take OTC simethicone, Tums, Pepcid as needed.  # Coronary atherosclerosis -Detected as incidental finding on CT done for staging purposes. Her last lipid panel showed LDL of 138.  Advised to discuss with primary about role of statin.  Rest per primary.  # Mild COPD changes -On CT's chest.  Prior history of smoking.  Asymptomatic.  # Access-port placed on 05/03/2023  No orders of the defined types were placed in this encounter.  RTC 1 for MD visit, labs, cycle 4-day 8 of cisplatin and gemcitabine.  The total time spent in the appointment was 30 minutes encounter with patients including review of chart and various tests results, discussions about plan of care and coordination of care plan   All questions were answered. The patient knows to call the clinic with any problems, questions or concerns. No barriers to learning was detected.  Michaelyn Barter, MD 9/30/20249:50 AM   HISTORY OF PRESENTING ILLNESS:  Barbara [name] 62 y.o. female with pmh of pmh of anxiety, GERD, right carotid artery  stenosis was referred to medical oncology for new diagnosis of muscle invasive urothelial cancer.  Patient develops urinary symptoms around September or October 2023.  Was treated with multiple rounds of antibiotics with no improvement.  She was then referred to Dr. Apolinar Junes for further evaluation.  CTU from 03/22/2023 showed moderate left hydroureteronephrosis to the UVJ, marked leftward asymmetric wall thickening of the urinary bladder measuring 18 mm in maximal thickness which extends over the UVJ.  Perivesical stranding soft tissue.  No evidence of upper tract or abdominopelvic metastatic disease.  s/p TURBT with left ureteral stent placement by Dr. Apolinar Junes.  IntraOp findings showed large irregular mucosal submucosal extensive tumor from the left dome, left lateral bladder wall majority of trigone including the left UO and bladder neck.  Measures at least 5 x 5 cm.  Pathology showed invasive high-grade urothelial carcinoma.  There is some discrepancy.  Lower down in the report mentions low-grade.  Invades muscularis propria.  Interval history Patient was seen today prior to cycle 4 day 1 of cisplatin and gemcitabine. Doing well.  Appetite is fair.  Does not have taste buds.  Abdominal pain has resolved.  Bowel movements are returning to normal.  Denies any neuropathy.  Denies any tinnitus.    I have reviewed her chart and materials related to her cancer extensively and collaborated history with the patient. Summary of oncologic history is as follows: Oncology History  Urothelial cancer (HCC)  04/12/2023 Initial Diagnosis   Urothelial cancer (HCC)   04/12/2023 Cancer Staging   Staging form: Kidney, AJCC 8th Edition - Clinical: Stage III (cT3, cN0, cM0) - Signed by Michaelyn Barter, MD on 05/07/2023 Stage prefix: Initial diagnosis   Malignant neoplasm of urinary bladder (HCC)  04/22/2023 Initial Diagnosis   Malignant neoplasm of urinary bladder (HCC)   05/07/2023 -  Chemotherapy   Patient is  on Treatment Plan : BLADDER Gemcitabine D1,8 + Cisplatin (split dose) D1,8 q21d x 4 cycles       MEDICAL HISTORY:  Past Medical History:  Diagnosis Date   Adopted    Allergic rhinitis    Anemia    h/o   Anxiety    Arthritis    B12 deficiency    Bilateral carotid artery stenosis    Bilateral carotid bruits    Bladder tumor    Cancer (HCC)    bladder cancer   Depression    Gallbladder polyp    GERD (gastroesophageal reflux disease)    Hydronephrosis of left kidney    Migraines    Tobacco abuse    UTI (urinary tract infection)     SURGICAL HISTORY: Past Surgical History:  Procedure Laterality Date   ABDOMINAL HYSTERECTOMY     BLADDER SURGERY     BREAST BIOPSY Left 2014   neg   CYSTOSCOPY W/ URETERAL STENT PLACEMENT Left 04/04/2023   Procedure: CYSTOSCOPY WITH RETROGRADE PYELOGRAM/URETERAL STENT PLACEMENT;  Surgeon: Vanna Scotland, MD;  Location: [facility] ORS;  Service: Urology;  Laterality: Left;   FOOT SURGERY Right    2009   IR IMAGING GUIDED PORT INSERTION  05/03/2023   LAPAROSCOPIC APPENDECTOMY  12/05/2012   NASAL SEPTUM SURGERY     SISTRUNK PROCEDURE  03/03/2012   Thyroglossal duct cyst excision   TONSILLECTOMY     TRANSURETHRAL RESECTION OF BLADDER TUMOR N/A 04/04/2023   Procedure: TRANSURETHRAL  RESECTION OF BLADDER TUMOR (TURBT);  Surgeon: Vanna Scotland, MD;  Location: [facility] ORS;  Service: Urology;  Laterality: N/A;    SOCIAL HISTORY: Social History   Socioeconomic History   Marital status: Married    Spouse name: Not on file   Number of children: Not on file   Years of education: Not on file   Highest education level: Not on file  Occupational History   Not on file  Tobacco Use   Smoking status: Former    Current packs/day: 0.25    Average packs/day: 0.3 packs/day for 30.0 years (7.5 ttl pk-yrs)    Types: Cigarettes    Passive exposure: Past   Smokeless tobacco: Never  Vaping Use   Vaping status: Never Used  Substance and Sexual Activity   Alcohol  use: Never   Drug use: Never   Sexual activity: Not on file  Other Topics Concern   Not on file  Social History Narrative   Not on file   Social Determinants of Health   Financial Resource Strain: Not on file  Food Insecurity: No Food Insecurity (04/12/2023)   Hunger Vital Sign    Worried About Running Out of Food in the Last Year: Never true    Ran Out of Food in the Last Year: Never true  Transportation Needs: No Transportation Needs (04/12/2023)   PRAPARE - Administrator, Civil Service (Medical): No    Lack of Transportation (Non-Medical): No  Physical Activity: Not on file  Stress: Not on file  Social Connections: Not on file  Intimate Partner Violence: Not At Risk (04/12/2023)   Humiliation, Afraid, Rape, and Kick questionnaire    Fear of Current or Ex-Partner: No    Emotionally Abused: No    Physically Abused: No    Sexually Abused: No    FAMILY HISTORY: Family History  Adopted: Yes    ALLERGIES:  is allergic to pseudoephedrine hcl.  MEDICATIONS:  Current Outpatient Medications  Medication Sig Dispense Refill   dicyclomine (BENTYL) 10 MG capsule Take 1 capsule (10 mg total) by mouth 3 (three) times daily as needed for up to 30 doses for spasms. 30 capsule 0   estradiol (ESTRACE) 1 MG tablet TAKE 1 TABLET BY MOUTH DAILY 90 tablet 3   lidocaine-prilocaine (EMLA) cream Apply 1 Application topically as needed. 30 g 0   ondansetron (ZOFRAN) 8 MG tablet Take 1 tablet (8 mg total) by mouth every 8 (eight) hours as needed for up to 30 doses for nausea or vomiting. 30 tablet 1   oxycodone-acetaminophen (PERCOCET) 2.5-325 MG tablet Take 1 tablet by mouth every 6 (six) hours as needed for pain. 30 tablet 0   prochlorperazine (COMPAZINE) 10 MG tablet Take 1 tablet (10 mg total) by mouth every 6 (six) hours as needed for nausea or vomiting. 30 tablet 0   topiramate (TOPAMAX) 50 MG tablet Take 2 tablets (100 mg total) by mouth at bedtime. 180 tablet 3   celecoxib  (CELEBREX) 200 MG capsule TAKE 1 CAPSULE BY MOUTH DAILY (Patient not taking: Reported on 05/28/2023) 90 capsule 3   docusate sodium (COLACE) 100 MG capsule Take 1 capsule (100 mg total) by mouth 2 (two) times daily. (Patient not taking: Reported on 07/01/2023) 60 capsule 2   HYDROcodone-acetaminophen (NORCO/VICODIN) 5-325 MG tablet Take 1 tablet by mouth every 6 (six) hours as needed for moderate pain. (Patient not taking: Reported on 06/17/2023) 20 tablet 0   ibuprofen (ADVIL) 200 MG tablet Take 800 mg by  mouth 2 (two) times daily. (Patient not taking: Reported on 05/07/2023)     oxybutynin (DITROPAN) 5 MG tablet Take 1 tablet (5 mg total) by mouth every 8 (eight) hours as needed for bladder spasms. (Patient not taking: Reported on 07/01/2023) 30 tablet 0   potassium chloride SA (KLOR-CON M) 20 MEQ tablet Take 1 tablet (20 mEq total) by mouth daily for 2 days. (Patient not taking: Reported on 07/01/2023) 2 tablet 0   senna (SENOKOT) 8.6 MG TABS tablet Take 2 tablets (17.2 mg total) by mouth daily as needed for mild constipation. (Patient not taking: Reported on 07/01/2023) 60 tablet 2   trimethoprim (TRIMPEX) 100 MG tablet Take 1 tablet (100 mg total) by mouth daily. (Patient not taking: Reported on 07/01/2023) 30 tablet 11   No current facility-administered medications for this visit.   Facility-Administered Medications Ordered in Other Visits  Medication Dose Route Frequency Provider Last Rate Last Admin   heparin lock flush 100 unit/mL  500 Units Intravenous Once Michaelyn Barter, MD       heparin lock flush 100 unit/mL  500 Units Intravenous Once Michaelyn Barter, MD       heparin lock flush 100 unit/mL  500 Units Intravenous Once Michaelyn Barter, MD       sodium chloride flush (NS) 0.9 % injection 10 mL  10 mL Intravenous Once Michaelyn Barter, MD       sodium chloride flush (NS) 0.9 % injection 10 mL  10 mL Intravenous Once Michaelyn Barter, MD       sodium chloride flush (NS) 0.9 % injection 10 mL  10 mL  Intravenous Once Michaelyn Barter, MD        REVIEW OF SYSTEMS:   Pertinent information mentioned in HPI All other systems were reviewed with the patient and are negative.  PHYSICAL EXAMINATION: ECOG PERFORMANCE STATUS: 1 - Symptomatic but completely ambulatory  Vitals:   07/22/23 0858  BP: 115/66  Pulse: 80  Temp: (!) 97.3 F (36.3 C)  SpO2: 99%    Filed Weights   07/22/23 0858  Weight: 121 lb 6.4 oz (55.1 kg)     GENERAL:alert, no distress and comfortable SKIN: skin color, texture, turgor are normal, no rashes or significant lesions EYES: normal, conjunctiva are pink and non-injected, sclera clear OROPHARYNX:no exudate, no erythema and lips, buccal mucosa, and tongue normal  NECK: supple, thyroid normal size, non-tender, without nodularity LYMPH:  no palpable lymphadenopathy in the cervical, axillary or inguinal LUNGS: clear to auscultation and percussion with normal breathing effort HEART: regular rate & rhythm and no murmurs and no lower extremity edema ABDOMEN:abdomen soft, non-tender and normal bowel sounds Musculoskeletal:no cyanosis of digits and no clubbing  PSYCH: alert & oriented x 3 with fluent speech NEURO: no focal motor/sensory deficits  LABORATORY DATA:  I have reviewed the data as listed Lab Results  Component Value Date   WBC 6.1 07/22/2023   HGB 10.3 (L) 07/22/2023   HCT 31.6 (L) 07/22/2023   MCV 105.7 (H) 07/22/2023   PLT 369 07/22/2023   Recent Labs    07/09/23 1300 07/15/23 0942 07/22/23 0840  NA 133* 136 137  K 3.6 3.6 4.0  CL 99 100 102  CO2 28 27 28   GLUCOSE 104* 92 93  BUN 22 21 24*  CREATININE 0.78 0.97 1.03*  CALCIUM 8.6* 8.7* 9.3  GFRNONAA >60 >60 >60  PROT 6.0* 6.5 7.0  ALBUMIN 3.7 3.9 4.3  AST 16 18 21   ALT 10 10 10   ALKPHOS  86 63 57  BILITOT 0.5 0.4 0.4    RADIOGRAPHIC STUDIES: I have personally reviewed the radiological images as listed and agreed with the findings in the report. No results found.

## 2023-07-22 NOTE — Progress Notes (Signed)
Patient says that her bowel movements are almost back to normal. She is doing well.

## 2023-07-23 ENCOUNTER — Inpatient Hospital Stay: Payer: Managed Care, Other (non HMO) | Attending: Internal Medicine

## 2023-07-23 ENCOUNTER — Ambulatory Visit: Payer: Managed Care, Other (non HMO)

## 2023-07-23 ENCOUNTER — Other Ambulatory Visit: Payer: Self-pay | Admitting: *Deleted

## 2023-07-23 VITALS — BP 130/82 | HR 97 | Temp 97.2°F | Resp 16

## 2023-07-23 DIAGNOSIS — Z5111 Encounter for antineoplastic chemotherapy: Secondary | ICD-10-CM | POA: Diagnosis present

## 2023-07-23 DIAGNOSIS — Z23 Encounter for immunization: Secondary | ICD-10-CM | POA: Diagnosis not present

## 2023-07-23 DIAGNOSIS — C679 Malignant neoplasm of bladder, unspecified: Secondary | ICD-10-CM | POA: Diagnosis present

## 2023-07-23 MED ORDER — SODIUM CHLORIDE 0.9 % IV SOLN
Freq: Once | INTRAVENOUS | Status: AC
  Filled 2023-07-23: qty 250

## 2023-07-23 MED ORDER — HEPARIN SOD (PORK) LOCK FLUSH 100 UNIT/ML IV SOLN
500.0000 [IU] | Freq: Once | INTRAVENOUS | Status: DC | PRN
  Filled 2023-07-23: qty 5

## 2023-07-23 MED ORDER — INFLUENZA VIRUS VACC SPLIT PF (FLUZONE) 0.5 ML IM SUSY
0.5000 mL | PREFILLED_SYRINGE | Freq: Once | INTRAMUSCULAR | Status: AC
  Administered 2023-07-23: 0.5 mL via INTRAMUSCULAR
  Filled 2023-07-23: qty 0.5

## 2023-07-23 MED ORDER — SODIUM CHLORIDE 0.9 % IV SOLN
35.0000 mg/m2 | Freq: Once | INTRAVENOUS | Status: AC
  Administered 2023-07-23: 56 mg via INTRAVENOUS
  Filled 2023-07-23: qty 56

## 2023-07-23 MED ORDER — SODIUM CHLORIDE 0.9 % IV SOLN
10.0000 mg | Freq: Once | INTRAVENOUS | Status: AC
  Administered 2023-07-23: 10 mg via INTRAVENOUS
  Filled 2023-07-23: qty 10
  Filled 2023-07-23: qty 1

## 2023-07-23 MED ORDER — POTASSIUM CHLORIDE IN NACL 20-0.9 MEQ/L-% IV SOLN
Freq: Once | INTRAVENOUS | Status: AC
  Filled 2023-07-23: qty 1000

## 2023-07-23 MED ORDER — PALONOSETRON HCL INJECTION 0.25 MG/5ML
0.2500 mg | Freq: Once | INTRAVENOUS | Status: AC
  Administered 2023-07-23: 0.25 mg via INTRAVENOUS
  Filled 2023-07-23: qty 5

## 2023-07-23 MED ORDER — SODIUM CHLORIDE 0.9 % IV SOLN
1000.0000 mg/m2 | Freq: Once | INTRAVENOUS | Status: AC
  Administered 2023-07-23: 1596 mg via INTRAVENOUS
  Filled 2023-07-23: qty 41.98

## 2023-07-23 MED ORDER — MAGNESIUM SULFATE 2 GM/50ML IV SOLN
2.0000 g | Freq: Once | INTRAVENOUS | Status: AC
  Administered 2023-07-23: 2 g via INTRAVENOUS
  Filled 2023-07-23: qty 50

## 2023-07-23 MED ORDER — ONDANSETRON HCL 8 MG PO TABS: 8.0000 mg | ORAL_TABLET | Freq: Three times a day (TID) | ORAL | 0 refills | Status: DC | PRN

## 2023-07-23 MED ORDER — SODIUM CHLORIDE 0.9 % IV SOLN
150.0000 mg | Freq: Once | INTRAVENOUS | Status: AC
  Administered 2023-07-23: 150 mg via INTRAVENOUS
  Filled 2023-07-23: qty 5
  Filled 2023-07-23: qty 150

## 2023-07-23 NOTE — Patient Instructions (Signed)
 [facility]  Discharge Instructions: Thank you for choosing [facility] to provide your oncology and hematology care.  If you have a lab appointment with the Cancer Center, please go directly to the Cancer Center and check in at the registration area.  Wear comfortable clothing and clothing appropriate for easy access to any Portacath or PICC line.   We strive to give you quality time with your provider. You may need to reschedule your appointment if you arrive late (15 or more minutes).  Arriving late affects you and other patients whose appointments are after yours.  Also, if you miss three or more appointments without notifying the office, you may be dismissed from the clinic at the provider's discretion.      For prescription refill requests, have your pharmacy contact our office and allow 72 hours for refills to be completed.    Today you received the following chemotherapy and/or immunotherapy agents Cisplatin & Gemzar      To help prevent nausea and vomiting after your treatment, we encourage you to take your nausea medication as directed.  BELOW ARE SYMPTOMS THAT SHOULD BE REPORTED IMMEDIATELY: *FEVER GREATER THAN 100.4 F (38 C) OR HIGHER *CHILLS OR SWEATING *NAUSEA AND VOMITING THAT IS NOT CONTROLLED WITH YOUR NAUSEA MEDICATION *UNUSUAL SHORTNESS OF BREATH *UNUSUAL BRUISING OR BLEEDING *URINARY PROBLEMS (pain or burning when urinating, or frequent urination) *BOWEL PROBLEMS (unusual diarrhea, constipation, pain near the anus) TENDERNESS IN MOUTH AND THROAT WITH OR WITHOUT PRESENCE OF ULCERS (sore throat, sores in mouth, or a toothache) UNUSUAL RASH, SWELLING OR PAIN  UNUSUAL VAGINAL DISCHARGE OR ITCHING   Items with * indicate a potential emergency and should be followed up as soon as possible or go to the Emergency Department if any problems should occur.  Please show the CHEMOTHERAPY ALERT CARD or IMMUNOTHERAPY ALERT CARD at  check-in to the Emergency Department and triage nurse.  Should you have questions after your visit or need to cancel or reschedule your appointment, please contact [facility]  [phone] and follow the prompts.  Office hours are 8:00 a.m. to 4:30 p.m. Monday - Friday. Please note that voicemails left after 4:00 p.m. may not be returned until the following business day.  We are closed weekends and major holidays. You have access to a nurse at all times for urgent questions. Please call the main number to the clinic [phone] and follow the prompts.  For any non-urgent questions, you may also contact your provider using MyChart. We now offer e-Visits for anyone 52 and older to request care online for non-urgent symptoms. For details visit [email or web address].   Also download the MyChart app! Go to the app store, search "MyChart", open the app, select [facility], and log in with your MyChart username and password.

## 2023-07-25 ENCOUNTER — Ambulatory Visit: Payer: Managed Care, Other (non HMO)

## 2023-07-29 ENCOUNTER — Telehealth: Payer: Self-pay | Admitting: *Deleted

## 2023-07-29 ENCOUNTER — Inpatient Hospital Stay (HOSPITAL_BASED_OUTPATIENT_CLINIC_OR_DEPARTMENT_OTHER): Payer: Managed Care, Other (non HMO) | Admitting: Internal Medicine

## 2023-07-29 ENCOUNTER — Inpatient Hospital Stay: Payer: Managed Care, Other (non HMO)

## 2023-07-29 VITALS — BP 122/68 | HR 90 | Temp 98.6°F | Wt 116.0 lb

## 2023-07-29 DIAGNOSIS — Z5111 Encounter for antineoplastic chemotherapy: Secondary | ICD-10-CM | POA: Diagnosis not present

## 2023-07-29 DIAGNOSIS — C679 Malignant neoplasm of bladder, unspecified: Secondary | ICD-10-CM

## 2023-07-29 DIAGNOSIS — R109 Unspecified abdominal pain: Secondary | ICD-10-CM

## 2023-07-29 DIAGNOSIS — N1330 Unspecified hydronephrosis: Secondary | ICD-10-CM | POA: Diagnosis not present

## 2023-07-29 DIAGNOSIS — N390 Urinary tract infection, site not specified: Secondary | ICD-10-CM

## 2023-07-29 LAB — CBC WITH DIFFERENTIAL (CANCER CENTER ONLY)
Abs Immature Granulocytes: 0.02 10*3/uL (ref 0.00–0.07)
Basophils Absolute: 0 10*3/uL (ref 0.0–0.1)
Basophils Relative: 1 %
Eosinophils Absolute: 0 10*3/uL (ref 0.0–0.5)
Eosinophils Relative: 1 %
HCT: 31.2 % — ABNORMAL LOW (ref 36.0–46.0)
Hemoglobin: 10.3 g/dL — ABNORMAL LOW (ref 12.0–15.0)
Immature Granulocytes: 1 %
Lymphocytes Relative: 42 %
Lymphs Abs: 1.6 10*3/uL (ref 0.7–4.0)
MCH: 34 pg (ref 26.0–34.0)
MCHC: 33 g/dL (ref 30.0–36.0)
MCV: 103 fL — ABNORMAL HIGH (ref 80.0–100.0)
Monocytes Absolute: 0.5 10*3/uL (ref 0.1–1.0)
Monocytes Relative: 13 %
Neutro Abs: 1.7 10*3/uL (ref 1.7–7.7)
Neutrophils Relative %: 42 %
Platelet Count: 303 10*3/uL (ref 150–400)
RBC: 3.03 MIL/uL — ABNORMAL LOW (ref 3.87–5.11)
RDW: 15 % (ref 11.5–15.5)
WBC Count: 3.9 10*3/uL — ABNORMAL LOW (ref 4.0–10.5)
nRBC: 0 % (ref 0.0–0.2)

## 2023-07-29 LAB — CMP (CANCER CENTER ONLY)
ALT: 12 U/L (ref 0–44)
AST: 17 U/L (ref 15–41)
Albumin: 4.1 g/dL (ref 3.5–5.0)
Alkaline Phosphatase: 47 U/L (ref 38–126)
Anion gap: 9 (ref 5–15)
BUN: 23 mg/dL (ref 8–23)
CO2: 27 mmol/L (ref 22–32)
Calcium: 8.8 mg/dL — ABNORMAL LOW (ref 8.9–10.3)
Chloride: 94 mmol/L — ABNORMAL LOW (ref 98–111)
Creatinine: 1.29 mg/dL — ABNORMAL HIGH (ref 0.44–1.00)
GFR, Estimated: 47 mL/min — ABNORMAL LOW (ref >60)
Glucose, Bld: 131 mg/dL — ABNORMAL HIGH (ref 70–99)
Potassium: 3.9 mmol/L (ref 3.5–5.1)
Sodium: 130 mmol/L — ABNORMAL LOW (ref 135–145)
Total Bilirubin: 0.6 mg/dL (ref 0.3–1.2)
Total Protein: 7.2 g/dL (ref 6.5–8.1)

## 2023-07-29 LAB — URINALYSIS, COMPLETE (UACMP) WITH MICROSCOPIC
Bilirubin Urine: NEGATIVE
Glucose, UA: NEGATIVE mg/dL
Ketones, ur: NEGATIVE mg/dL
Nitrite: NEGATIVE
Protein, ur: 100 mg/dL — AB
RBC / HPF: >50 RBC/hpf (ref 0–5)
Specific Gravity, Urine: 1.014 (ref 1.005–1.030)
Squamous Epithelial / HPF: 0 /[HPF] (ref 0–5)
WBC, UA: >50 WBC/hpf (ref 0–5)
pH: 6 (ref 5.0–8.0)

## 2023-07-29 LAB — MAGNESIUM: Magnesium: 1.9 mg/dL (ref 1.7–2.4)

## 2023-07-29 MED ORDER — CIPROFLOXACIN HCL 500 MG PO TABS: 500.0000 mg | ORAL_TABLET | Freq: Two times a day (BID) | ORAL | 0 refills | Status: DC

## 2023-07-29 NOTE — Progress Notes (Signed)
Thursday evening patient started feeling nausea without vomiting just dry heaving were she would feel like she was going to black out. She hasn't been able to eat all weekend just about. Also lower back pain which she thinks it is a kidney infection, urination is cloudy and has an oder.

## 2023-07-29 NOTE — Telephone Encounter (Signed)
Received FMLA extention request, form completed  partially and sent to doctor for completion and signature

## 2023-07-29 NOTE — Progress Notes (Signed)
[facility] CONSULT NOTE  Patient Care Team: McDonough, Renard Matter as PCP - General (Physician Assistant) Michaelyn Barter, MD as Consulting Physician (Oncology)  REASON FOR REFFERAL: Muscle invasive bladder cancer  CANCER STAGING   Cancer Staging  Urothelial cancer [facility]) Staging form: Kidney, AJCC 8th Edition - Clinical: Stage III (cT3, cN0, cM0) - Signed by Michaelyn Barter, MD on 05/07/2023 Stage prefix: Initial diagnosis   ASSESSMENT & PLAN:  Barbara [name] 62 y.o. female with pmh of anxiety, GERD, right carotid artery stenosis was referred to medical oncology for new diagnosis of muscle invasive urothelial cancer.  # Muscle invasive urothelial carcinoma, cT3N0M0 # Moderate left hydroureteronephrosis s/p left ureteral stent - Patient developed urinary symptoms around September or October 2023.  Was treated with multiple rounds of antibiotics with no improvement.  She was then referred to Dr. Apolinar Junes for further evaluation.  - CTU from 03/22/2023 showed moderate left hydroureteronephrosis to the UVJ, marked leftward asymmetric wall thickening of the urinary bladder measuring 18 mm in maximal thickness which extends over the UVJ.  Perivesical stranding soft tissue.  No evidence of upper tract or abdominopelvic metastatic disease.  - s/p TURBT with left ureteral stent placement by Dr. Apolinar Junes.  IntraOp findings showed large irregular mucosal submucosal extensive tumor from the left dome, left lateral bladder wall majority of trigone including the left UO and bladder neck.  Measures at least 5 x 5 cm. Pathology showed invasive high-grade urothelial carcinoma.  There is some discrepancy.  Lower down in the report mentions low-grade.  Invades muscularis propria.  -CT chest without contrast did not show any evidence of metastatic disease.  Showed mild COPD changes, there is aortic atherosclerosis as well as atherosclerosis of great vessels of mediastinum and coronary arteries  including calcified atherosclerotic plaque in the left main, left anterior descending and left circumflex coronary artery.  Calcifications of the aortic valve.  -Patient seen today prior to cycle 4-day 8 of cisplatin and gemcitabine.  Labs reviewed and within parameters for treatment.  But patient has been having nausea, dry heaving since Thursday.  She has not been able to eat for few days.  And had couple of episodes of near syncope.  She feels very overwhelmed and does not think can go through further chemotherapy.  Reports left flank pain and foul-smelling urine.  We discussed about delaying the chemo by 1 week and reassessing if her symptoms improved.  She is concerned about extending 1 more week due to issues from her work.  We have filled out the paperwork that is needed from our end.  After discussion, patient has decided to forego last round of chemotherapy.  Will cancel chemo for tomorrow.  I offered IV hydration, IV antiemetics.  She declined.  She is okay with taking oral meds and increasing her oral hydration.  She is scheduled to see Dr. Berneice Heinrich today afternoon to discuss about cystectomy.  I will follow-up with her 4 to 5 weeks after surgery to discuss pathology report and possibility of adjuvant Opdivo for 1 year.  # Left flank pain # Foul-smelling urine -Differential include complicated UTI, stent blockage, hydronephrosis.   -UA is suspicious for UTI.  Urine culture is pending. -Sent prescription for ciprofloxacin 500 mg twice daily for 7 days for complicated UTI.  Cannot rule out pyelonephritis.  Also has history of left ureteral stent placement. -Will obtain CT abdomen pelvis to rule out any hydronephrosis or stent blockage with her left flank pain.  # Chemotherapy  induced anemia -Hemoglobin 10.  Continue to monitor.  # Chemo induced nausea -Continue with dexamethasone for 3 days post chemo.  Continue with Compazine and Zofran as needed  # Constipation-does not like MiraLAX.  Sent  prescription for Colace and senna  # Acid reflux/gas/bloating-advised to take OTC simethicone, Tums, Pepcid as needed.  # Coronary atherosclerosis -Detected as incidental finding on CT done for staging purposes. Her last lipid panel showed LDL of 138.  Advised to discuss with primary about role of statin.  Rest per primary.  # Mild COPD changes -On CT's chest.  Prior history of smoking.  Asymptomatic.  # Access-port placed on 05/03/2023  Orders Placed This Encounter  Procedures   Urine Culture    Standing Status:   Future    Number of Occurrences:   1    Standing Expiration Date:   07/28/2024   CT ABDOMEN W CONTRAST    Standing Status:   Future    Standing Expiration Date:   07/28/2024    Order Specific Question:   If indicated for the ordered procedure, I authorize the administration of contrast media per Radiology protocol    Answer:   Yes    Order Specific Question:   Does the patient have a contrast media/X-ray dye allergy?    Answer:   No    Order Specific Question:   Preferred imaging location?    Answer:   [facility]    Order Specific Question:   If indicated for the ordered procedure, I authorize the administration of oral contrast media per Radiology protocol    Answer:   Yes   Urinalysis, Complete w Microscopic    Standing Status:   Future    Number of Occurrences:   1    Standing Expiration Date:   07/28/2024   RTC in 5 weeks for MD visit to discuss path report.  The total time spent in the appointment was 30 minutes encounter with patients including review of chart and various tests results, discussions about plan of care and coordination of care plan   All questions were answered. The patient knows to call the clinic with any problems, questions or concerns. No barriers to learning was detected.  Michaelyn Barter, MD 10/7/202412:50 PM   HISTORY OF PRESENTING ILLNESS:  Barbara [name] 62 y.o. female with pmh of pmh of anxiety, GERD, right carotid artery  stenosis was referred to medical oncology for new diagnosis of muscle invasive urothelial cancer.  Patient develops urinary symptoms around September or October 2023.  Was treated with multiple rounds of antibiotics with no improvement.  She was then referred to Dr. Apolinar Junes for further evaluation.  CTU from 03/22/2023 showed moderate left hydroureteronephrosis to the UVJ, marked leftward asymmetric wall thickening of the urinary bladder measuring 18 mm in maximal thickness which extends over the UVJ.  Perivesical stranding soft tissue.  No evidence of upper tract or abdominopelvic metastatic disease.  s/p TURBT with left ureteral stent placement by Dr. Apolinar Junes.  IntraOp findings showed large irregular mucosal submucosal extensive tumor from the left dome, left lateral bladder wall majority of trigone including the left UO and bladder neck.  Measures at least 5 x 5 cm.  Pathology showed invasive high-grade urothelial carcinoma.  There is some discrepancy.  Lower down in the report mentions low-grade.  Invades muscularis propria.  Interval history Patient was seen today prior to cycle 4 day 8 of cisplatin and gemcitabine.  Patient has been having nausea, dry heaving since Thursday.  She has not been able to eat for few days.  And had couple of episodes of near syncope.  She feels very overwhelmed and does not think can go through further chemotherapy.  Reports left flank pain and foul-smelling urine.    I have reviewed her chart and materials related to her cancer extensively and collaborated history with the patient. Summary of oncologic history is as follows: Oncology History  Urothelial cancer (HCC)  04/12/2023 Initial Diagnosis   Urothelial cancer (HCC)   04/12/2023 Cancer Staging   Staging form: Kidney, AJCC 8th Edition - Clinical: Stage III (cT3, cN0, cM0) - Signed by Michaelyn Barter, MD on 05/07/2023 Stage prefix: Initial diagnosis   Malignant neoplasm of urinary bladder (HCC)  04/22/2023  Initial Diagnosis   Malignant neoplasm of urinary bladder (HCC)   05/07/2023 -  Chemotherapy   Patient is on Treatment Plan : BLADDER Gemcitabine D1,8 + Cisplatin (split dose) D1,8 q21d x 4 cycles       MEDICAL HISTORY:  Past Medical History:  Diagnosis Date   Adopted    Allergic rhinitis    Anemia    h/o   Anxiety    Arthritis    B12 deficiency    Bilateral carotid artery stenosis    Bilateral carotid bruits    Bladder tumor    Cancer (HCC)    bladder cancer   Depression    Gallbladder polyp    GERD (gastroesophageal reflux disease)    Hydronephrosis of left kidney    Migraines    Tobacco abuse    UTI (urinary tract infection)     SURGICAL HISTORY: Past Surgical History:  Procedure Laterality Date   ABDOMINAL HYSTERECTOMY     BLADDER SURGERY     BREAST BIOPSY Left 2014   neg   CYSTOSCOPY W/ URETERAL STENT PLACEMENT Left 04/04/2023   Procedure: CYSTOSCOPY WITH RETROGRADE PYELOGRAM/URETERAL STENT PLACEMENT;  Surgeon: Vanna Scotland, MD;  Location: [facility] ORS;  Service: Urology;  Laterality: Left;   FOOT SURGERY Right    2009   IR IMAGING GUIDED PORT INSERTION  05/03/2023   LAPAROSCOPIC APPENDECTOMY  12/05/2012   NASAL SEPTUM SURGERY     SISTRUNK PROCEDURE  03/03/2012   Thyroglossal duct cyst excision   TONSILLECTOMY     TRANSURETHRAL RESECTION OF BLADDER TUMOR N/A 04/04/2023   Procedure: TRANSURETHRAL RESECTION OF BLADDER TUMOR (TURBT);  Surgeon: Vanna Scotland, MD;  Location: [facility] ORS;  Service: Urology;  Laterality: N/A;    SOCIAL HISTORY: Social History   Socioeconomic History   Marital status: Married    Spouse name: Not on file   Number of children: Not on file   Years of education: Not on file   Highest education level: Not on file  Occupational History   Not on file  Tobacco Use   Smoking status: Former    Current packs/day: 0.25    Average packs/day: 0.3 packs/day for 30.0 years (7.5 ttl pk-yrs)    Types: Cigarettes    Passive exposure: Past    Smokeless tobacco: Never  Vaping Use   Vaping status: Never Used  Substance and Sexual Activity   Alcohol use: Never   Drug use: Never   Sexual activity: Not on file  Other Topics Concern   Not on file  Social History Narrative   Not on file   Social Determinants of Health   Financial Resource Strain: Not on file  Food Insecurity: No Food Insecurity (04/12/2023)   Hunger Vital Sign    Worried About  Running Out of Food in the Last Year: Never true    Ran Out of Food in the Last Year: Never true  Transportation Needs: No Transportation Needs (04/12/2023)   PRAPARE - Administrator, Civil Service (Medical): No    Lack of Transportation (Non-Medical): No  Physical Activity: Not on file  Stress: Not on file  Social Connections: Not on file  Intimate Partner Violence: Not At Risk (04/12/2023)   Humiliation, Afraid, Rape, and Kick questionnaire    Fear of Current or Ex-Partner: No    Emotionally Abused: No    Physically Abused: No    Sexually Abused: No    FAMILY HISTORY: Family History  Adopted: Yes    ALLERGIES:  is allergic to pseudoephedrine hcl.  MEDICATIONS:  Current Outpatient Medications  Medication Sig Dispense Refill   ciprofloxacin (CIPRO) 500 MG tablet Take 1 tablet (500 mg total) by mouth 2 (two) times daily for 7 days. 14 tablet 0   dicyclomine (BENTYL) 10 MG capsule Take 1 capsule (10 mg total) by mouth 3 (three) times daily as needed for up to 30 doses for spasms. 30 capsule 0   estradiol (ESTRACE) 1 MG tablet TAKE 1 TABLET BY MOUTH DAILY 90 tablet 3   lidocaine-prilocaine (EMLA) cream Apply 1 Application topically as needed. 30 g 0   ondansetron (ZOFRAN) 8 MG tablet Take 1 tablet (8 mg total) by mouth every 8 (eight) hours as needed for up to 30 doses for nausea or vomiting. 30 tablet 0   oxycodone-acetaminophen (PERCOCET) 2.5-325 MG tablet Take 1 tablet by mouth every 6 (six) hours as needed for pain. 30 tablet 0   prochlorperazine (COMPAZINE) 10  MG tablet Take 1 tablet (10 mg total) by mouth every 6 (six) hours as needed for nausea or vomiting. 30 tablet 0   topiramate (TOPAMAX) 50 MG tablet Take 2 tablets (100 mg total) by mouth at bedtime. 180 tablet 3   celecoxib (CELEBREX) 200 MG capsule TAKE 1 CAPSULE BY MOUTH DAILY (Patient not taking: Reported on 05/28/2023) 90 capsule 3   docusate sodium (COLACE) 100 MG capsule Take 1 capsule (100 mg total) by mouth 2 (two) times daily. (Patient not taking: Reported on 07/01/2023) 60 capsule 2   ibuprofen (ADVIL) 200 MG tablet Take 800 mg by mouth 2 (two) times daily. (Patient not taking: Reported on 05/07/2023)     oxybutynin (DITROPAN) 5 MG tablet Take 1 tablet (5 mg total) by mouth every 8 (eight) hours as needed for bladder spasms. (Patient not taking: Reported on 07/01/2023) 30 tablet 0   potassium chloride SA (KLOR-CON M) 20 MEQ tablet Take 1 tablet (20 mEq total) by mouth daily for 2 days. (Patient not taking: Reported on 07/01/2023) 2 tablet 0   senna (SENOKOT) 8.6 MG TABS tablet Take 2 tablets (17.2 mg total) by mouth daily as needed for mild constipation. (Patient not taking: Reported on 07/01/2023) 60 tablet 2   trimethoprim (TRIMPEX) 100 MG tablet Take 1 tablet (100 mg total) by mouth daily. (Patient not taking: Reported on 07/01/2023) 30 tablet 11   No current facility-administered medications for this visit.   Facility-Administered Medications Ordered in Other Visits  Medication Dose Route Frequency Provider Last Rate Last Admin   heparin lock flush 100 unit/mL  500 Units Intravenous Once Michaelyn Barter, MD       heparin lock flush 100 unit/mL  500 Units Intravenous Once Michaelyn Barter, MD       heparin lock flush 100 unit/mL  500 Units Intravenous Once Michaelyn Barter, MD       sodium chloride flush (NS) 0.9 % injection 10 mL  10 mL Intravenous Once Michaelyn Barter, MD       sodium chloride flush (NS) 0.9 % injection 10 mL  10 mL Intravenous Once Michaelyn Barter, MD       sodium chloride flush  (NS) 0.9 % injection 10 mL  10 mL Intravenous Once Michaelyn Barter, MD        REVIEW OF SYSTEMS:   Pertinent information mentioned in HPI All other systems were reviewed with the patient and are negative.  PHYSICAL EXAMINATION: ECOG PERFORMANCE STATUS: 1 - Symptomatic but completely ambulatory  Vitals:   07/29/23 0923  BP: 122/68  Pulse: 90  Temp: 98.6 F (37 C)  SpO2: 100%    Filed Weights   07/29/23 0923  Weight: 116 lb (52.6 kg)     GENERAL:alert, no distress and comfortable SKIN: skin color, texture, turgor are normal, no rashes or significant lesions EYES: normal, conjunctiva are pink and non-injected, sclera clear OROPHARYNX:no exudate, no erythema and lips, buccal mucosa, and tongue normal  NECK: supple, thyroid normal size, non-tender, without nodularity LYMPH:  no palpable lymphadenopathy in the cervical, axillary or inguinal LUNGS: clear to auscultation and percussion with normal breathing effort HEART: regular rate & rhythm and no murmurs and no lower extremity edema ABDOMEN:abdomen soft, non-tender and normal bowel sounds Musculoskeletal:no cyanosis of digits and no clubbing  PSYCH: alert & oriented x 3 with fluent speech NEURO: no focal motor/sensory deficits  LABORATORY DATA:  I have reviewed the data as listed Lab Results  Component Value Date   WBC 3.9 (L) 07/29/2023   HGB 10.3 (L) 07/29/2023   HCT 31.2 (L) 07/29/2023   MCV 103.0 (H) 07/29/2023   PLT 303 07/29/2023   Recent Labs    07/15/23 0942 07/22/23 0840 07/29/23 0900  NA 136 137 130*  K 3.6 4.0 3.9  CL 100 102 94*  CO2 27 28 27   GLUCOSE 92 93 131*  BUN 21 24* 23  CREATININE 0.97 1.03* 1.29*  CALCIUM 8.7* 9.3 8.8*  GFRNONAA >60 >60 47*  PROT 6.5 7.0 7.2  ALBUMIN 3.9 4.3 4.1  AST 18 21 17   ALT 10 10 12   ALKPHOS 63 57 47  BILITOT 0.4 0.4 0.6    RADIOGRAPHIC STUDIES: I have personally reviewed the radiological images as listed and agreed with the findings in the report. No  results found.

## 2023-07-30 ENCOUNTER — Encounter: Payer: Self-pay | Admitting: Nurse Practitioner

## 2023-07-30 ENCOUNTER — Inpatient Hospital Stay: Payer: Managed Care, Other (non HMO)

## 2023-07-30 ENCOUNTER — Encounter: Payer: Self-pay | Admitting: Internal Medicine

## 2023-07-30 NOTE — Telephone Encounter (Signed)
FMLA completed and signed and faxed to [employer]. Patient informed via MyChart message.

## 2023-07-31 ENCOUNTER — Telehealth: Payer: Self-pay | Admitting: *Deleted

## 2023-07-31 ENCOUNTER — Other Ambulatory Visit: Payer: Self-pay | Admitting: Internal Medicine

## 2023-07-31 LAB — URINE CULTURE: Culture: >=100000 — AB

## 2023-07-31 MED ORDER — AMOXICILLIN-POT CLAVULANATE 875-125 MG PO TABS: 1.0000 | ORAL_TABLET | Freq: Two times a day (BID) | ORAL | 0 refills | Status: AC

## 2023-07-31 NOTE — Telephone Encounter (Signed)
RN placed call to patient to update on urine culture results and new antibiotics. Per message from Dr. Alena Bills, Urine culture shows E. Coli resistant to Cipro, new prescription for Augmentin BID for 10 days sent to patients pharmacy. Patient is aware of change and verbalized understanding.

## 2023-07-31 NOTE — Progress Notes (Signed)
Patient was started on ciprofloxacin awaiting urine culture results.   Culture reviewed showing E.coli resistant to cipro.  She has previously taken Bactrim which caused AKI. Would avoid that.   Sent script for augmentin BID for 10 days for acute complicated UTI. Staff requested to inform the patient.

## 2023-08-01 ENCOUNTER — Ambulatory Visit: Admission: RE | Admit: 2023-08-01 | Payer: Managed Care, Other (non HMO) | Source: Ambulatory Visit

## 2023-08-01 ENCOUNTER — Ambulatory Visit: Payer: Managed Care, Other (non HMO)

## 2023-08-06 ENCOUNTER — Other Ambulatory Visit: Payer: Self-pay | Admitting: Urology

## 2023-08-08 ENCOUNTER — Encounter: Payer: Self-pay | Admitting: Internal Medicine

## 2023-08-09 ENCOUNTER — Telehealth: Payer: Self-pay

## 2023-08-09 ENCOUNTER — Encounter: Payer: Self-pay | Admitting: *Deleted

## 2023-08-09 NOTE — Telephone Encounter (Signed)
On 08/02/2023 I faxed over the disability benefits papers for her for employer.

## 2023-09-02 ENCOUNTER — Inpatient Hospital Stay: Payer: Managed Care, Other (non HMO)

## 2023-09-02 ENCOUNTER — Inpatient Hospital Stay: Payer: Managed Care, Other (non HMO) | Attending: Internal Medicine | Admitting: Internal Medicine

## 2023-09-02 VITALS — BP 133/65 | HR 66 | Temp 96.9°F | Wt 120.6 lb

## 2023-09-02 DIAGNOSIS — Z79899 Other long term (current) drug therapy: Secondary | ICD-10-CM | POA: Diagnosis not present

## 2023-09-02 DIAGNOSIS — D6481 Anemia due to antineoplastic chemotherapy: Secondary | ICD-10-CM | POA: Diagnosis not present

## 2023-09-02 DIAGNOSIS — R319 Hematuria, unspecified: Secondary | ICD-10-CM | POA: Insufficient documentation

## 2023-09-02 DIAGNOSIS — K219 Gastro-esophageal reflux disease without esophagitis: Secondary | ICD-10-CM | POA: Diagnosis not present

## 2023-09-02 DIAGNOSIS — J449 Chronic obstructive pulmonary disease, unspecified: Secondary | ICD-10-CM | POA: Diagnosis not present

## 2023-09-02 DIAGNOSIS — Z87891 Personal history of nicotine dependence: Secondary | ICD-10-CM | POA: Diagnosis not present

## 2023-09-02 DIAGNOSIS — C679 Malignant neoplasm of bladder, unspecified: Secondary | ICD-10-CM | POA: Insufficient documentation

## 2023-09-02 DIAGNOSIS — R311 Benign essential microscopic hematuria: Secondary | ICD-10-CM | POA: Diagnosis not present

## 2023-09-02 DIAGNOSIS — T451X5A Adverse effect of antineoplastic and immunosuppressive drugs, initial encounter: Secondary | ICD-10-CM | POA: Insufficient documentation

## 2023-09-02 LAB — URINALYSIS, COMPLETE (UACMP) WITH MICROSCOPIC
Bilirubin Urine: NEGATIVE
Glucose, UA: NEGATIVE mg/dL
Ketones, ur: NEGATIVE mg/dL
Nitrite: POSITIVE — AB
Protein, ur: 100 mg/dL — AB
RBC / HPF: >50 RBC/hpf (ref 0–5)
Specific Gravity, Urine: 1.015 (ref 1.005–1.030)
Squamous Epithelial / HPF: 0 /[HPF] (ref 0–5)
WBC, UA: >50 WBC/hpf (ref 0–5)
pH: 6 (ref 5.0–8.0)

## 2023-09-02 LAB — CMP (CANCER CENTER ONLY)
ALT: 13 U/L (ref 0–44)
AST: 22 U/L (ref 15–41)
Albumin: 4 g/dL (ref 3.5–5.0)
Alkaline Phosphatase: 42 U/L (ref 38–126)
Anion gap: 7 (ref 5–15)
BUN: 29 mg/dL — ABNORMAL HIGH (ref 8–23)
CO2: 24 mmol/L (ref 22–32)
Calcium: 8.9 mg/dL (ref 8.9–10.3)
Chloride: 106 mmol/L (ref 98–111)
Creatinine: 1.15 mg/dL — ABNORMAL HIGH (ref 0.44–1.00)
GFR, Estimated: 54 mL/min — ABNORMAL LOW (ref >60)
Glucose, Bld: 84 mg/dL (ref 70–99)
Potassium: 4.6 mmol/L (ref 3.5–5.1)
Sodium: 137 mmol/L (ref 135–145)
Total Bilirubin: 0.5 mg/dL (ref <1.2)
Total Protein: 7 g/dL (ref 6.5–8.1)

## 2023-09-02 LAB — CBC (CANCER CENTER ONLY)
HCT: 34.4 % — ABNORMAL LOW (ref 36.0–46.0)
Hemoglobin: 11.3 g/dL — ABNORMAL LOW (ref 12.0–15.0)
MCH: 33.3 pg (ref 26.0–34.0)
MCHC: 32.8 g/dL (ref 30.0–36.0)
MCV: 101.5 fL — ABNORMAL HIGH (ref 80.0–100.0)
Platelet Count: 178 10*3/uL (ref 150–400)
RBC: 3.39 MIL/uL — ABNORMAL LOW (ref 3.87–5.11)
RDW: 12.6 % (ref 11.5–15.5)
WBC Count: 5 10*3/uL (ref 4.0–10.5)
nRBC: 0 % (ref 0.0–0.2)

## 2023-09-02 NOTE — Progress Notes (Signed)
Patient says that she is doing pretty good, her taste is coming back, her hair has started falling out more now that she is finished with treatment though. She has been seeing some blood in her urine for the past 2 weeks.

## 2023-09-02 NOTE — Progress Notes (Signed)
[facility] CONSULT NOTE  Patient Care Team: McDonough, Renard Matter as PCP - General (Physician Assistant) Michaelyn Barter, MD as Consulting Physician (Oncology)  REASON FOR REFFERAL: Muscle invasive bladder cancer  CANCER STAGING   Cancer Staging  Urothelial cancer [facility]) Staging form: Kidney, AJCC 8th Edition - Clinical: Stage III (cT3, cN0, cM0) - Signed by Michaelyn Barter, MD on 05/07/2023 Stage prefix: Initial diagnosis   ASSESSMENT & PLAN:  Barbara [name] 62 y.o. female with pmh of anxiety, GERD, right carotid artery stenosis was referred to medical oncology for new diagnosis of muscle invasive urothelial cancer.  # Muscle invasive urothelial carcinoma, cT3N0M0 # Moderate left hydroureteronephrosis s/p left ureteral stent - Patient developed urinary symptoms around September or October 2023.  Was treated with multiple rounds of antibiotics with no improvement.  She was then referred to Dr. Apolinar Junes for further evaluation.  - CTU from 03/22/2023 showed moderate left hydroureteronephrosis to the UVJ, marked leftward asymmetric wall thickening of the urinary bladder measuring 18 mm in maximal thickness which extends over the UVJ.  Perivesical stranding soft tissue.  No evidence of upper tract or abdominopelvic metastatic disease.  - s/p TURBT with left ureteral stent placement by Dr. Apolinar Junes.  IntraOp findings showed large irregular mucosal submucosal extensive tumor from the left dome, left lateral bladder wall majority of trigone including the left UO and bladder neck.  Measures at least 5 x 5 cm. Pathology showed invasive high-grade urothelial carcinoma.  There is some discrepancy.  Lower down in the report mentions low-grade.  Invades muscularis propria.  -Completed total cycle 4-day 1 of split dose cisplatin and gemcitabine (07/23/2023). Pt declined C4D8 due to poor tolerance/nausea/flank pain.  Split dose cisplatin considered for borderline CrCl (36-48). -Scheduled  for cystectomy with Dr. Berneice Heinrich on 12/4. I will f/u with her 3 weeks post op to discuss path and role of adjuvant IO.  She reports terminal hematuria for the past 2 weeks.  I reached out to Dr. Berneice Heinrich if he needs an updated scan prior to procedure.  He thinks it may be stent related and would like to hold off.  # Chemotherapy induced anemia -Hemoglobin 10.  Continue to monitor.  # Acid reflux/gas/bloating-advised to take OTC simethicone, Tums, Pepcid as needed.  # Coronary atherosclerosis -Detected as incidental finding on CT done for staging purposes. Her last lipid panel showed LDL of 138.  Advised to discuss with primary about role of statin.  Rest per primary.  # Mild COPD changes -On CT's chest.  Prior history of smoking.  Asymptomatic.  # Access-port placed on 05/03/2023  No orders of the defined types were placed in this encounter.  RTC 3 weeks after the surgery to discuss path report.  The total time spent in the appointment was 30 minutes encounter with patients including review of chart and various tests results, discussions about plan of care and coordination of care plan   All questions were answered. The patient knows to call the clinic with any problems, questions or concerns. No barriers to learning was detected.  Michaelyn Barter, MD 11/11/20242:14 PM   HISTORY OF PRESENTING ILLNESS:  Barbara [name] 62 y.o. female with pmh of pmh of anxiety, GERD, right carotid artery stenosis was referred to medical oncology for new diagnosis of muscle invasive urothelial cancer.  Patient develops urinary symptoms around September or October 2023.  Was treated with multiple rounds of antibiotics with no improvement.  She was then referred to Dr. Apolinar Junes for further  evaluation.  CTU from 03/22/2023 showed moderate left hydroureteronephrosis to the UVJ, marked leftward asymmetric wall thickening of the urinary bladder measuring 18 mm in maximal thickness which extends over the UVJ.   Perivesical stranding soft tissue.  No evidence of upper tract or abdominopelvic metastatic disease.  s/p TURBT with left ureteral stent placement by Dr. Apolinar Junes.  IntraOp findings showed large irregular mucosal submucosal extensive tumor from the left dome, left lateral bladder wall majority of trigone including the left UO and bladder neck.  Measures at least 5 x 5 cm.  Pathology showed invasive high-grade urothelial carcinoma.  There is some discrepancy.  Lower down in the report mentions low-grade.  Invades muscularis propria.  Interval history Patient was seen today as follow-up post completion of chemotherapy. Overall she is starting to feel better but not 100%.  Her hair fall has actually worsened after stopping the chemotherapy.  Reports that she is gone back to work and has been stressful.  Reports terminal hematuria for the past 2 weeks.  Also noticed some blood on wiping.  I have reviewed her chart and materials related to her cancer extensively and collaborated history with the patient. Summary of oncologic history is as follows: Oncology History  Urothelial cancer (HCC)  04/12/2023 Initial Diagnosis   Urothelial cancer (HCC)   04/12/2023 Cancer Staging   Staging form: Kidney, AJCC 8th Edition - Clinical: Stage III (cT3, cN0, cM0) - Signed by Michaelyn Barter, MD on 05/07/2023 Stage prefix: Initial diagnosis   Malignant neoplasm of urinary bladder (HCC)  04/22/2023 Initial Diagnosis   Malignant neoplasm of urinary bladder (HCC)   05/07/2023 -  Chemotherapy   Patient is on Treatment Plan : BLADDER Gemcitabine D1,8 + Cisplatin (split dose) D1,8 q21d x 4 cycles       MEDICAL HISTORY:  Past Medical History:  Diagnosis Date   Adopted    Allergic rhinitis    Anemia    h/o   Anxiety    Arthritis    B12 deficiency    Bilateral carotid artery stenosis    Bilateral carotid bruits    Bladder tumor    Cancer (HCC)    bladder cancer   Depression    Gallbladder polyp    GERD  (gastroesophageal reflux disease)    Hydronephrosis of left kidney    Migraines    Tobacco abuse    UTI (urinary tract infection)     SURGICAL HISTORY: Past Surgical History:  Procedure Laterality Date   ABDOMINAL HYSTERECTOMY     BLADDER SURGERY     BREAST BIOPSY Left 2014   neg   CYSTOSCOPY W/ URETERAL STENT PLACEMENT Left 04/04/2023   Procedure: CYSTOSCOPY WITH RETROGRADE PYELOGRAM/URETERAL STENT PLACEMENT;  Surgeon: Vanna Scotland, MD;  Location: [facility] ORS;  Service: Urology;  Laterality: Left;   FOOT SURGERY Right    2009   IR IMAGING GUIDED PORT INSERTION  05/03/2023   LAPAROSCOPIC APPENDECTOMY  12/05/2012   NASAL SEPTUM SURGERY     SISTRUNK PROCEDURE  03/03/2012   Thyroglossal duct cyst excision   TONSILLECTOMY     TRANSURETHRAL RESECTION OF BLADDER TUMOR N/A 04/04/2023   Procedure: TRANSURETHRAL RESECTION OF BLADDER TUMOR (TURBT);  Surgeon: Vanna Scotland, MD;  Location: [facility] ORS;  Service: Urology;  Laterality: N/A;    SOCIAL HISTORY: Social History   Socioeconomic History   Marital status: Married    Spouse name: Not on file   Number of children: Not on file   Years of education: Not on file  Highest education level: Not on file  Occupational History   Not on file  Tobacco Use   Smoking status: Former    Current packs/day: 0.25    Average packs/day: 0.3 packs/day for 30.0 years (7.5 ttl pk-yrs)    Types: Cigarettes    Passive exposure: Past   Smokeless tobacco: Never  Vaping Use   Vaping status: Never Used  Substance and Sexual Activity   Alcohol use: Never   Drug use: Never   Sexual activity: Not on file  Other Topics Concern   Not on file  Social History Narrative   Not on file   Social Determinants of Health   Financial Resource Strain: Not on file  Food Insecurity: No Food Insecurity (04/12/2023)   Hunger Vital Sign    Worried About Running Out of Food in the Last Year: Never true    Ran Out of Food in the Last Year: Never true   Transportation Needs: No Transportation Needs (04/12/2023)   PRAPARE - Administrator, Civil Service (Medical): No    Lack of Transportation (Non-Medical): No  Physical Activity: Not on file  Stress: Not on file  Social Connections: Not on file  Intimate Partner Violence: Not At Risk (04/12/2023)   Humiliation, Afraid, Rape, and Kick questionnaire    Fear of Current or Ex-Partner: No    Emotionally Abused: No    Physically Abused: No    Sexually Abused: No    FAMILY HISTORY: Family History  Adopted: Yes    ALLERGIES:  is allergic to pseudoephedrine hcl.  MEDICATIONS:  Current Outpatient Medications  Medication Sig Dispense Refill   celecoxib (CELEBREX) 200 MG capsule TAKE 1 CAPSULE BY MOUTH DAILY 90 capsule 3   dicyclomine (BENTYL) 10 MG capsule Take 1 capsule (10 mg total) by mouth 3 (three) times daily as needed for up to 30 doses for spasms. 30 capsule 0   estradiol (ESTRACE) 1 MG tablet TAKE 1 TABLET BY MOUTH DAILY 90 tablet 3   lidocaine-prilocaine (EMLA) cream Apply 1 Application topically as needed. 30 g 0   ondansetron (ZOFRAN) 8 MG tablet Take 1 tablet (8 mg total) by mouth every 8 (eight) hours as needed for up to 30 doses for nausea or vomiting. 30 tablet 0   oxycodone-acetaminophen (PERCOCET) 2.5-325 MG tablet Take 1 tablet by mouth every 6 (six) hours as needed for pain. 30 tablet 0   topiramate (TOPAMAX) 50 MG tablet Take 2 tablets (100 mg total) by mouth at bedtime. 180 tablet 3   ibuprofen (ADVIL) 200 MG tablet Take 800 mg by mouth 2 (two) times daily. (Patient not taking: Reported on 05/07/2023)     oxybutynin (DITROPAN) 5 MG tablet Take 1 tablet (5 mg total) by mouth every 8 (eight) hours as needed for bladder spasms. (Patient not taking: Reported on 07/01/2023) 30 tablet 0   potassium chloride SA (KLOR-CON M) 20 MEQ tablet Take 1 tablet (20 mEq total) by mouth daily for 2 days. (Patient not taking: Reported on 07/01/2023) 2 tablet 0   prochlorperazine  (COMPAZINE) 10 MG tablet Take 1 tablet (10 mg total) by mouth every 6 (six) hours as needed for nausea or vomiting. (Patient not taking: Reported on 09/02/2023) 30 tablet 0   trimethoprim (TRIMPEX) 100 MG tablet Take 1 tablet (100 mg total) by mouth daily. (Patient not taking: Reported on 07/01/2023) 30 tablet 11   No current facility-administered medications for this visit.   Facility-Administered Medications Ordered in Other Visits  Medication  Dose Route Frequency Provider Last Rate Last Admin   heparin lock flush 100 unit/mL  500 Units Intravenous Once Michaelyn Barter, MD       heparin lock flush 100 unit/mL  500 Units Intravenous Once Michaelyn Barter, MD       heparin lock flush 100 unit/mL  500 Units Intravenous Once Michaelyn Barter, MD       sodium chloride flush (NS) 0.9 % injection 10 mL  10 mL Intravenous Once Michaelyn Barter, MD       sodium chloride flush (NS) 0.9 % injection 10 mL  10 mL Intravenous Once Michaelyn Barter, MD       sodium chloride flush (NS) 0.9 % injection 10 mL  10 mL Intravenous Once Michaelyn Barter, MD        REVIEW OF SYSTEMS:   Pertinent information mentioned in HPI All other systems were reviewed with the patient and are negative.  PHYSICAL EXAMINATION: ECOG PERFORMANCE STATUS: 1 - Symptomatic but completely ambulatory  Vitals:   09/02/23 1048  BP: 133/65  Pulse: 66  Temp: (!) 96.9 F (36.1 C)  SpO2: 100%    Filed Weights   09/02/23 1048  Weight: 120 lb 9.6 oz (54.7 kg)     GENERAL:alert, no distress and comfortable SKIN: skin color, texture, turgor are normal, no rashes or significant lesions EYES: normal, conjunctiva are pink and non-injected, sclera clear OROPHARYNX:no exudate, no erythema and lips, buccal mucosa, and tongue normal  NECK: supple, thyroid normal size, non-tender, without nodularity LYMPH:  no palpable lymphadenopathy in the cervical, axillary or inguinal LUNGS: clear to auscultation and percussion with normal breathing  effort HEART: regular rate & rhythm and no murmurs and no lower extremity edema ABDOMEN:abdomen soft, non-tender and normal bowel sounds Musculoskeletal:no cyanosis of digits and no clubbing  PSYCH: alert & oriented x 3 with fluent speech NEURO: no focal motor/sensory deficits  LABORATORY DATA:  I have reviewed the data as listed Lab Results  Component Value Date   WBC 5.0 09/02/2023   HGB 11.3 (L) 09/02/2023   HCT 34.4 (L) 09/02/2023   MCV 101.5 (H) 09/02/2023   PLT 178 09/02/2023   Recent Labs    07/22/23 0840 07/29/23 0900 09/02/23 1119  NA 137 130* 137  K 4.0 3.9 4.6  CL 102 94* 106  CO2 28 27 24   GLUCOSE 93 131* 84  BUN 24* 23 29*  CREATININE 1.03* 1.29* 1.15*  CALCIUM 9.3 8.8* 8.9  GFRNONAA >60 47* 54*  PROT 7.0 7.2 7.0  ALBUMIN 4.3 4.1 4.0  AST 21 17 22   ALT 10 12 13   ALKPHOS 57 47 42  BILITOT 0.4 0.6 0.5    RADIOGRAPHIC STUDIES: I have personally reviewed the radiological images as listed and agreed with the findings in the report. No results found.

## 2023-09-03 ENCOUNTER — Other Ambulatory Visit: Payer: Self-pay

## 2023-09-03 ENCOUNTER — Ambulatory Visit (HOSPITAL_COMMUNITY)
Admission: RE | Admit: 2023-09-03 | Discharge: 2023-09-03 | Disposition: A | Discharge status: Home / Self Care | Payer: Managed Care, Other (non HMO) | Source: Ambulatory Visit | Attending: Physician Assistant | Admitting: Physician Assistant

## 2023-09-03 DIAGNOSIS — C679 Malignant neoplasm of bladder, unspecified: Secondary | ICD-10-CM

## 2023-09-03 DIAGNOSIS — Z7189 Other specified counseling: Secondary | ICD-10-CM

## 2023-09-03 DIAGNOSIS — Z932 Ileostomy status: Secondary | ICD-10-CM | POA: Diagnosis present

## 2023-09-03 NOTE — Discharge Instructions (Signed)
Clinic phone  [phone] [email or web address]  See you after surgery!  YOu will do GREAT!

## 2023-09-03 NOTE — Progress Notes (Signed)
[facility]   Reason for visit:  Marking for Ileal conduit HPI:  Bladder cancer Past Medical History:  Diagnosis Date   Adopted    Allergic rhinitis    Anemia    h/o   Anxiety    Arthritis    B12 deficiency    Bilateral carotid artery stenosis    Bilateral carotid bruits    Bladder tumor    Cancer (HCC)    bladder cancer   Depression    Gallbladder polyp    GERD (gastroesophageal reflux disease)    Hydronephrosis of left kidney    Migraines    Tobacco abuse    UTI (urinary tract infection)    Family History  Adopted: Yes   Allergies  Allergen Reactions   Pseudoephedrine Hcl Other (See Comments)    "Jittery"   Current Outpatient Medications  Medication Sig Dispense Refill Last Dose   celecoxib (CELEBREX) 200 MG capsule TAKE 1 CAPSULE BY MOUTH DAILY 90 capsule 3    cephALEXin (KEFLEX) 500 MG capsule Take 1 capsule (500 mg total) by mouth 4 (four) times daily for 7 days. 28 capsule 0    dicyclomine (BENTYL) 10 MG capsule Take 1 capsule (10 mg total) by mouth 3 (three) times daily as needed for up to 30 doses for spasms. 30 capsule 0    estradiol (ESTRACE) 1 MG tablet TAKE 1 TABLET BY MOUTH DAILY 90 tablet 3    ibuprofen (ADVIL) 200 MG tablet Take 800 mg by mouth 2 (two) times daily. (Patient not taking: Reported on 05/07/2023)      lidocaine-prilocaine (EMLA) cream Apply 1 Application topically as needed. 30 g 0    ondansetron (ZOFRAN) 8 MG tablet Take 1 tablet (8 mg total) by mouth every 8 (eight) hours as needed for up to 30 doses for nausea or vomiting. 30 tablet 0    oxybutynin (DITROPAN) 5 MG tablet Take 1 tablet (5 mg total) by mouth every 8 (eight) hours as needed for bladder spasms. (Patient not taking: Reported on 07/01/2023) 30 tablet 0    oxycodone-acetaminophen (PERCOCET) 2.5-325 MG tablet Take 1 tablet by mouth every 6 (six) hours as needed for pain. 30 tablet 0    potassium chloride SA (KLOR-CON M) 20 MEQ tablet Take 1 tablet (20 mEq total) by  mouth daily for 2 days. (Patient not taking: Reported on 07/01/2023) 2 tablet 0    prochlorperazine (COMPAZINE) 10 MG tablet Take 1 tablet (10 mg total) by mouth every 6 (six) hours as needed for nausea or vomiting. (Patient not taking: Reported on 09/02/2023) 30 tablet 0    topiramate (TOPAMAX) 50 MG tablet Take 2 tablets (100 mg total) by mouth at bedtime. 180 tablet 3    trimethoprim (TRIMPEX) 100 MG tablet Take 1 tablet (100 mg total) by mouth daily. (Patient not taking: Reported on 07/01/2023) 30 tablet 11    No current facility-administered medications for this encounter.   Facility-Administered Medications Ordered in Other Encounters  Medication Dose Route Frequency Provider Last Rate Last Admin   heparin lock flush 100 unit/mL  500 Units Intravenous Once Michaelyn Barter, MD       heparin lock flush 100 unit/mL  500 Units Intravenous Once Michaelyn Barter, MD       heparin lock flush 100 unit/mL  500 Units Intravenous Once Michaelyn Barter, MD       sodium chloride flush (NS) 0.9 % injection 10 mL  10 mL Intravenous Once Michaelyn Barter, MD  sodium chloride flush (NS) 0.9 % injection 10 mL  10 mL Intravenous Once Michaelyn Barter, MD       sodium chloride flush (NS) 0.9 % injection 10 mL  10 mL Intravenous Once Michaelyn Barter, MD       ROS  Review of Systems  Constitutional: Negative.   Genitourinary:        Bladder cancer  Psychiatric/Behavioral:  The patient is nervous/anxious.   All other systems reviewed and are negative.  Vital signs:  There were no vitals taken for this visit. Exam:  Physical Exam Constitutional:      Appearance: Normal appearance.  Cardiovascular:     Rate and Rhythm: Normal rate and regular rhythm.  Abdominal:     General: Abdomen is flat.     Palpations: Abdomen is soft.  Musculoskeletal:        General: Normal range of motion.  Skin:    General: Skin is warm and dry.  Neurological:     Mental Status: She is alert and oriented to person, place,  and time.  Psychiatric:        Mood and Affect: Mood normal.        Behavior: Behavior normal.      Discussed surgical procedure and stoma creation with patient. Provided the patient with educational booklet and provided samples of pouching options.  Answered patient questions.   Examined patient lying, sitting, and standing in order to place the marking in the patient's visual field, away from any creases or abdominal contour issues and within the rectus muscle.     Marked for ileal conduit in the RUQ 4 cm to the right of the umbilicus and  3  cm above the umbilicus.  Patient wears most pants just below umbilicus and felt a lower marking would interfere with moist clothing.    Patient's abdomen cleansed with CHG wipes at site markings, allowed to air dry prior to marking.Covered mark with thin film transparent dressing to preserve mark until date of surgery.    Impression/dx  Bladder cancer Upcoming surgery for ileal conduit Discussion  Discussed inpatient education patient would receive.  Demonstrated 1 piece vs 2 piece pouches.  Attaching to nighttime drainage bag.  Emptying and changing pouch 2-3 times weekly.  Plan  Patient is anxious about upcoming surgery but is ready to feel better.     Visit time: 55 minutes.   Maple Hudson FNP-BC

## 2023-09-04 LAB — URINE CULTURE: Culture: >=100000 — AB

## 2023-09-06 DIAGNOSIS — Z7189 Other specified counseling: Secondary | ICD-10-CM | POA: Insufficient documentation

## 2023-09-06 MED ORDER — CEPHALEXIN 500 MG PO CAPS: 500.0000 mg | ORAL_CAPSULE | Freq: Four times a day (QID) | ORAL | 0 refills | Status: AC

## 2023-09-06 NOTE — Addendum Note (Signed)
Addended byMichaelyn Barter on: 09/06/2023 09:36 AM   Modules accepted: Orders

## 2023-09-15 ENCOUNTER — Other Ambulatory Visit: Payer: Self-pay | Admitting: Physician Assistant

## 2023-09-15 DIAGNOSIS — E2839 Other primary ovarian failure: Secondary | ICD-10-CM

## 2023-09-17 NOTE — Patient Instructions (Signed)
SURGICAL WAITING ROOM VISITATION  Patients having surgery or a procedure may have no more than 2 support people in the waiting area - these visitors may rotate.    Children under the age of 35 must have an adult with them who is not the patient.  Due to an increase in RSV and influenza rates and associated hospitalizations, children ages 48 and under may not visit patients in [facility] hospitals.  If the patient needs to stay at the hospital during part of their recovery, the visitor guidelines for inpatient rooms apply. Pre-op nurse will coordinate an appropriate time for 1 support person to accompany patient in pre-op.  This support person may not rotate.    Please refer to the [facility] website for the visitor guidelines for Inpatients (after your surgery is over and you are in a regular room).    Your procedure is scheduled on: 09/24/23   Report to [facility] Main Entrance    Report to admitting at 6:15 AM   Call this number if you have problems the morning of surgery [phone]   Do not eat food or drink liquids :After Midnight.          If you have questions, please contact your surgeon's office.   FOLLOW BOWEL PREP AND ANY ADDITIONAL PRE OP INSTRUCTIONS YOU RECEIVED FROM YOUR SURGEON'S OFFICE!!!     Oral Hygiene is also important to reduce your risk of infection.                                    Remember - BRUSH YOUR TEETH THE MORNING OF SURGERY WITH YOUR REGULAR TOOTHPASTE  DENTURES WILL BE REMOVED PRIOR TO SURGERY PLEASE DO NOT APPLY "Poly grip" OR ADHESIVES!!!   Stop all vitamins and herbal supplements 7 days before surgery.   Take these medicines the morning of surgery with A SIP OF WATER: Lexapro, Zofran                               You may not have any metal on your body including hair pins, jewelry, and body piercing             Do not wear make-up, lotions, powders, perfumes, or deodorant  Do not wear nail polish including gel and S&S,  artificial/acrylic nails, or any other type of covering on natural nails including finger and toenails. If you have artificial nails, gel coating, etc. that needs to be removed by a nail salon please have this removed prior to surgery or surgery may need to be canceled/ delayed if the surgeon/ anesthesia feels like they are unable to be safely monitored.   Do not shave  48 hours prior to surgery.    Do not bring valuables to the hospital. [facility] IS NOT             RESPONSIBLE   FOR VALUABLES.   Contacts, glasses, dentures or bridgework may not be worn into surgery.   Bring small overnight bag day of surgery.   DO NOT BRING YOUR HOME MEDICATIONS TO THE HOSPITAL. PHARMACY WILL DISPENSE MEDICATIONS LISTED ON YOUR MEDICATION LIST TO YOU DURING YOUR ADMISSION IN THE HOSPITAL!   Special Instructions: Bring a copy of your healthcare power of attorney and living will documents the day of surgery if you haven't scanned them before.  Please read over the following fact sheets you were given: IF YOU HAVE QUESTIONS ABOUT YOUR PRE-OP INSTRUCTIONS PLEASE CALL [name]    If you received a COVID test during your pre-op visit  it is requested that you wear a mask when out in public, stay away from anyone that may not be feeling well and notify your surgeon if you develop symptoms. If you test positive for Covid or have been in contact with anyone that has tested positive in the last 10 days please notify you surgeon.    [facility] - Preparing for Surgery Before surgery, you can play an important role.  Because skin is not sterile, your skin needs to be as free of germs as possible.  You can reduce the number of germs on your skin by washing with CHG (chlorahexidine gluconate) soap before surgery.  CHG is an antiseptic cleaner which kills germs and bonds with the skin to continue killing germs even after washing. Please DO NOT use if you have an allergy to CHG or antibacterial  soaps.  If your skin becomes reddened/irritated stop using the CHG and inform your nurse when you arrive at Short Stay. Do not shave (including legs and underarms) for at least 48 hours prior to the first CHG shower.  You may shave your face/neck.  Please follow these instructions carefully:  1.  Shower with CHG Soap the night before surgery and the  morning of surgery.  2.  If you choose to wash your hair, wash your hair first as usual with your normal  shampoo.  3.  After you shampoo, rinse your hair and body thoroughly to remove the shampoo.                             4.  Use CHG as you would any other liquid soap.  You can apply chg directly to the skin and wash.  Gently with a scrungie or clean washcloth.  5.  Apply the CHG Soap to your body ONLY FROM THE NECK DOWN.   Do   not use on face/ open                           Wound or open sores. Avoid contact with eyes, ears mouth and   genitals (private parts).                       Wash face,  Genitals (private parts) with your normal soap.             6.  Wash thoroughly, paying special attention to the area where your    surgery  will be performed.  7.  Thoroughly rinse your body with warm water from the neck down.  8.  DO NOT shower/wash with your normal soap after using and rinsing off the CHG Soap.                9.  Pat yourself dry with a clean towel.            10.  Wear clean pajamas.            11.  Place clean sheets on your bed the night of your first shower and do not  sleep with pets. Day of Surgery : Do not apply any lotions/deodorants the morning of surgery.  Please wear clean clothes to the hospital/surgery  center.  FAILURE TO FOLLOW THESE INSTRUCTIONS MAY RESULT IN THE CANCELLATION OF YOUR SURGERY  PATIENT SIGNATURE_________________________________  NURSE SIGNATURE__________________________________  ________________________________________________________________________ WHAT IS A BLOOD TRANSFUSION? Blood Transfusion  Information  A transfusion is the replacement of blood or some of its parts. Blood is made up of multiple cells which provide different functions. Red blood cells carry oxygen and are used for blood loss replacement. White blood cells fight against infection. Platelets control bleeding. Plasma helps clot blood. Other blood products are available for specialized needs, such as hemophilia or other clotting disorders. BEFORE THE TRANSFUSION  Who gives blood for transfusions?  Healthy volunteers who are fully evaluated to make sure their blood is safe. This is blood bank blood. Transfusion therapy is the safest it has ever been in the practice of medicine. Before blood is taken from a donor, a complete history is taken to make sure that person has no history of diseases nor engages in risky social behavior (examples are intravenous drug use or sexual activity with multiple partners). The donor's travel history is screened to minimize risk of transmitting infections, such as malaria. The donated blood is tested for signs of infectious diseases, such as HIV and hepatitis. The blood is then tested to be sure it is compatible with you in order to minimize the chance of a transfusion reaction. If you or a relative donates blood, this is often done in anticipation of surgery and is not appropriate for emergency situations. It takes many days to process the donated blood. RISKS AND COMPLICATIONS Although transfusion therapy is very safe and saves many lives, the main dangers of transfusion include:  Getting an infectious disease. Developing a transfusion reaction. This is an allergic reaction to something in the blood you were given. Every precaution is taken to prevent this. The decision to have a blood transfusion has been considered carefully by your caregiver before blood is given. Blood is not given unless the benefits outweigh the risks. AFTER THE TRANSFUSION Right after receiving a blood transfusion,  you will usually feel much better and more energetic. This is especially true if your red blood cells have gotten low (anemic). The transfusion raises the level of the red blood cells which carry oxygen, and this usually causes an energy increase. The nurse administering the transfusion will monitor you carefully for complications. HOME CARE INSTRUCTIONS  No special instructions are needed after a transfusion. You may find your energy is better. Speak with your caregiver about any limitations on activity for underlying diseases you may have. SEEK MEDICAL CARE IF:  Your condition is not improving after your transfusion. You develop redness or irritation at the intravenous (IV) site. SEEK IMMEDIATE MEDICAL CARE IF:  Any of the following symptoms occur over the next 12 hours: Shaking chills. You have a temperature by mouth above 102 F (38.9 C), not controlled by medicine. Chest, back, or muscle pain. People around you feel you are not acting correctly or are confused. Shortness of breath or difficulty breathing. Dizziness and fainting. You get a rash or develop hives. You have a decrease in urine output. Your urine turns a dark color or changes to pink, red, or brown. Any of the following symptoms occur over the next 10 days: You have a temperature by mouth above 102 F (38.9 C), not controlled by medicine. Shortness of breath. Weakness after normal activity. The white part of the eye turns yellow (jaundice). You have a decrease in the amount of urine or are urinating less often.  Your urine turns a dark color or changes to pink, red, or brown. Document Released: 10/05/2000 Document Revised: 12/31/2011 Document Reviewed: 05/24/2008 [facility] Patient Information 2014 [place], [place].  _______________________________________________________________________

## 2023-09-17 NOTE — Progress Notes (Signed)
COVID Vaccine Completed: yes  Date of COVID positive in last 90 days:  PCP - Lynn Ito, PA Cardiologist -   CT- 04/22/23 Epic Chest x-ray -  EKG -  Stress Test -  ECHO -  Cardiac Cath -  Pacemaker/ICD device last checked: Spinal Cord Stimulator:  Bowel Prep -   Sleep Study -  CPAP -   Fasting Blood Sugar -  Checks Blood Sugar _____ times a day  Last dose of GLP1 agonist-  N/A GLP1 instructions:  Hold 7 days before surgery    Last dose of SGLT-2 inhibitors-  N/A SGLT-2 instructions:  Hold 3 days before surgery    Blood Thinner Instructions:  Time Aspirin Instructions: Last Dose:  Activity level:  Can go up a flight of stairs and perform activities of daily living without stopping and without symptoms of chest pain or shortness of breath.  Able to exercise without symptoms  Unable to go up a flight of stairs without symptoms of     Anesthesia review:   Patient denies shortness of breath, fever, cough and chest pain at PAT appointment  Patient verbalized understanding of instructions that were given to them at the PAT appointment. Patient was also instructed that they will need to review over the PAT instructions again at home before surgery.

## 2023-09-18 ENCOUNTER — Other Ambulatory Visit: Payer: Self-pay

## 2023-09-18 ENCOUNTER — Encounter (HOSPITAL_COMMUNITY)
Admission: RE | Admit: 2023-09-18 | Discharge: 2023-09-18 | Disposition: A | Discharge status: Home / Self Care | Payer: Managed Care, Other (non HMO) | Source: Ambulatory Visit | Attending: Urology | Admitting: Urology

## 2023-09-18 ENCOUNTER — Encounter (HOSPITAL_COMMUNITY): Payer: Self-pay

## 2023-09-18 VITALS — BP 108/74 | HR 85 | Temp 98.6°F | Resp 14 | Ht 65.54 in | Wt 118.0 lb

## 2023-09-18 DIAGNOSIS — Z87891 Personal history of nicotine dependence: Secondary | ICD-10-CM | POA: Diagnosis not present

## 2023-09-18 DIAGNOSIS — R011 Cardiac murmur, unspecified: Secondary | ICD-10-CM | POA: Insufficient documentation

## 2023-09-18 DIAGNOSIS — D649 Anemia, unspecified: Secondary | ICD-10-CM | POA: Insufficient documentation

## 2023-09-18 DIAGNOSIS — Z01818 Encounter for other preprocedural examination: Secondary | ICD-10-CM

## 2023-09-18 DIAGNOSIS — Z01812 Encounter for preprocedural laboratory examination: Secondary | ICD-10-CM | POA: Insufficient documentation

## 2023-09-18 DIAGNOSIS — N189 Chronic kidney disease, unspecified: Secondary | ICD-10-CM | POA: Insufficient documentation

## 2023-09-18 DIAGNOSIS — I700 Atherosclerosis of aorta: Secondary | ICD-10-CM | POA: Diagnosis not present

## 2023-09-18 DIAGNOSIS — G43909 Migraine, unspecified, not intractable, without status migrainosus: Secondary | ICD-10-CM | POA: Insufficient documentation

## 2023-09-18 DIAGNOSIS — I6523 Occlusion and stenosis of bilateral carotid arteries: Secondary | ICD-10-CM | POA: Insufficient documentation

## 2023-09-18 DIAGNOSIS — C679 Malignant neoplasm of bladder, unspecified: Secondary | ICD-10-CM | POA: Insufficient documentation

## 2023-09-18 DIAGNOSIS — J449 Chronic obstructive pulmonary disease, unspecified: Secondary | ICD-10-CM | POA: Insufficient documentation

## 2023-09-18 HISTORY — DX: Cardiac murmur, unspecified: R01.1

## 2023-09-18 NOTE — Progress Notes (Signed)
Case: [id number] Date/Time: 09/25/23 0815   Procedures:      XI ROBOTIC ASSISTED LAPAROSCOPIC COMPLETE CYSTECTECTOMY, AND ILEAL CONDUIT WITH LYMPH NODE DISSECTION     XI ROBOTIC ASSISTED LAPAROSCOPIC BILATERAL SALPINGO-OOPHORECTOMY (Bilateral)     CYSTOSCOPY WITH ICG   Anesthesia type: General   Pre-op diagnosis: BLADDER CANCER   Location[facility] ROOM 03 / [facility]   Surgeons: Loletta Parish., MD       DISCUSSION: Barbara [name] is a 62 yo female who presents to PAT prior to surgery above. PMH of former smoking, aortic atherosclerosis (by CT), heart murmur, carotid artery stenosis, COPD (by CT), migraines, anemia, CKD, bladder cancer s/p TURBT on 04/04/23  Patient has known heart murmur that she states she has had "for years". She reports having multiple surgeries in the past without any issues. She states multiple providers have heard her heart murmur and never thought it was an issue. She has never had an echo. She denies chest pain, SOB, lightheadedness, syncope. She has good functional status. Last surgery was in June 2024 without any issues. Murmur was auscultated and was heard throughout the precordium. There was radiation to the carotids although she has known carotid artery stenosis and bruits documented.   Case was discussed with Dr. Tacy Dura who also auscultated the patients heart murmur and he stated it was ok to proceed however patient needs to have heart murmur worked up in the future.  CT Chest from 04/22/23 states:  "There are severe calcifications of the aortic valve. Echocardiographic correlation for evaluation of potential valvular dysfunction may be warranted if clinically indicated."  VS: BP 108/74   Pulse 85   Temp 37 C (Oral)   Resp 14   Ht 5' 5.54" (1.665 m)   Wt 53.5 kg   SpO2 100%   BMI 19.31 kg/m   PROVIDERS: McDonough, Lauren K, PA-C   LABS:  Will be obtained prior to surgery (all labs ordered are listed, but only abnormal results are displayed)  Labs  Reviewed  CBC  COMPREHENSIVE METABOLIC PANEL  TYPE AND SCREEN     IMAGES:  CT Chest 04/22/23:  IMPRESSION: 1. No findings to suggest metastatic disease to the thorax. 2. Mild diffuse bronchial wall thickening with very mild centrilobular and paraseptal emphysema; imaging findings suggestive of underlying COPD. 3. Aortic atherosclerosis, in addition to left main and 2 vessel coronary artery disease. Please note that although the presence of coronary artery calcium documents the presence of coronary artery disease, the severity of this disease and any potential stenosis cannot be assessed on this non-gated CT examination. Assessment for potential risk factor modification, dietary therapy or pharmacologic therapy may be warranted, if clinically indicated. 4. There are severe calcifications of the aortic valve. Echocardiographic correlation for evaluation of potential valvular dysfunction may be warranted if clinically indicated.   Aortic Atherosclerosis (ICD10-I70.0) and Emphysema (ICD10-J43.9).  EKG:   CV:  Carotid US 11/09/22:  IMPRESSION: Mild smooth bilateral atherosclerotic plaque without significant stenosis.   Vertebral arteries are patent with normal antegrade flow.    Past Medical History:  Diagnosis Date   Adopted    Allergic rhinitis    Anemia    h/o   Anxiety    Arthritis    B12 deficiency    Bilateral carotid artery stenosis    Bilateral carotid bruits    Bladder tumor    Cancer [facility])    bladder cancer   Depression    Gallbladder polyp    GERD (gastroesophageal  reflux disease)    Heart murmur    Hydronephrosis of left kidney    Migraines    Tobacco abuse    UTI (urinary tract infection)     Past Surgical History:  Procedure Laterality Date   ABDOMINAL HYSTERECTOMY     BLADDER SURGERY     BREAST BIOPSY Left 2014   neg   CYSTOSCOPY W/ URETERAL STENT PLACEMENT Left 04/04/2023   Procedure: CYSTOSCOPY WITH RETROGRADE PYELOGRAM/URETERAL STENT  PLACEMENT;  Surgeon: Vanna Scotland, MD;  Location: [facility] ORS;  Service: Urology;  Laterality: Left;   FOOT SURGERY Right    2009   IR IMAGING GUIDED PORT INSERTION  05/03/2023   LAPAROSCOPIC APPENDECTOMY  12/05/2012   NASAL SEPTUM SURGERY     SISTRUNK PROCEDURE  03/03/2012   Thyroglossal duct cyst excision   TONSILLECTOMY     TRANSURETHRAL RESECTION OF BLADDER TUMOR N/A 04/04/2023   Procedure: TRANSURETHRAL RESECTION OF BLADDER TUMOR (TURBT);  Surgeon: Vanna Scotland, MD;  Location: [facility] ORS;  Service: Urology;  Laterality: N/A;    MEDICATIONS:  carboxymethylcellulose (REFRESH PLUS) 0.5 % SOLN   celecoxib (CELEBREX) 200 MG capsule   dicyclomine (BENTYL) 10 MG capsule   escitalopram (LEXAPRO) 5 MG tablet   estradiol (ESTRACE) 1 MG tablet   lidocaine-prilocaine (EMLA) cream   ondansetron (ZOFRAN) 8 MG tablet   oxybutynin (DITROPAN) 5 MG tablet   oxycodone-acetaminophen (PERCOCET) 2.5-325 MG tablet   potassium chloride SA (KLOR-CON M) 20 MEQ tablet   prochlorperazine (COMPAZINE) 10 MG tablet   topiramate (TOPAMAX) 50 MG tablet   trimethoprim (TRIMPEX) 100 MG tablet   No current facility-administered medications for this encounter.    heparin lock flush 100 unit/mL   heparin lock flush 100 unit/mL   heparin lock flush 100 unit/mL   sodium chloride flush (NS) 0.9 % injection 10 mL   sodium chloride flush (NS) 0.9 % injection 10 mL   sodium chloride flush (NS) 0.9 % injection 10 mL    [name] [facility] Short Stay/Anesthesiology [facility] Phone [phone] 09/18/2023 2:32 PM

## 2023-09-18 NOTE — Progress Notes (Signed)
Patient is coming in night before surgery. Put blood orders back in to draw then.

## 2023-09-22 ENCOUNTER — Other Ambulatory Visit: Payer: Self-pay

## 2023-09-24 ENCOUNTER — Inpatient Hospital Stay (HOSPITAL_COMMUNITY)
Admission: RE | Admit: 2023-09-24 | Discharge: 2023-09-26 | DRG: 654 | Disposition: A | Discharge status: Home / Self Care | Payer: Managed Care, Other (non HMO) | Source: Ambulatory Visit | Attending: Urology | Admitting: Urology

## 2023-09-24 DIAGNOSIS — C7911 Secondary malignant neoplasm of bladder: Secondary | ICD-10-CM | POA: Diagnosis present

## 2023-09-24 DIAGNOSIS — Z01818 Encounter for other preprocedural examination: Principal | ICD-10-CM

## 2023-09-24 DIAGNOSIS — Z87891 Personal history of nicotine dependence: Secondary | ICD-10-CM | POA: Diagnosis not present

## 2023-09-24 DIAGNOSIS — D630 Anemia in neoplastic disease: Secondary | ICD-10-CM | POA: Diagnosis present

## 2023-09-24 DIAGNOSIS — C679 Malignant neoplasm of bladder, unspecified: Principal | ICD-10-CM | POA: Diagnosis present

## 2023-09-24 DIAGNOSIS — Z79899 Other long term (current) drug therapy: Secondary | ICD-10-CM | POA: Diagnosis not present

## 2023-09-24 DIAGNOSIS — F419 Anxiety disorder, unspecified: Secondary | ICD-10-CM | POA: Diagnosis present

## 2023-09-24 DIAGNOSIS — N736 Female pelvic peritoneal adhesions (postinfective): Secondary | ICD-10-CM | POA: Diagnosis present

## 2023-09-24 DIAGNOSIS — G43909 Migraine, unspecified, not intractable, without status migrainosus: Secondary | ICD-10-CM | POA: Diagnosis present

## 2023-09-24 DIAGNOSIS — H04123 Dry eye syndrome of bilateral lacrimal glands: Secondary | ICD-10-CM | POA: Diagnosis present

## 2023-09-24 DIAGNOSIS — C786 Secondary malignant neoplasm of retroperitoneum and peritoneum: Secondary | ICD-10-CM | POA: Diagnosis present

## 2023-09-24 DIAGNOSIS — Z8551 Personal history of malignant neoplasm of bladder: Secondary | ICD-10-CM | POA: Diagnosis not present

## 2023-09-24 DIAGNOSIS — N1339 Other hydronephrosis: Secondary | ICD-10-CM | POA: Diagnosis present

## 2023-09-24 DIAGNOSIS — K219 Gastro-esophageal reflux disease without esophagitis: Secondary | ICD-10-CM | POA: Diagnosis present

## 2023-09-24 DIAGNOSIS — K660 Peritoneal adhesions (postprocedural) (postinfection): Secondary | ICD-10-CM | POA: Diagnosis present

## 2023-09-24 DIAGNOSIS — Z9221 Personal history of antineoplastic chemotherapy: Secondary | ICD-10-CM

## 2023-09-24 DIAGNOSIS — Z888 Allergy status to other drugs, medicaments and biological substances status: Secondary | ICD-10-CM | POA: Diagnosis not present

## 2023-09-24 LAB — COMPREHENSIVE METABOLIC PANEL
ALT: 12 U/L (ref 0–44)
AST: 19 U/L (ref 15–41)
Albumin: 3.9 g/dL (ref 3.5–5.0)
Alkaline Phosphatase: 47 U/L (ref 38–126)
Anion gap: 8 (ref 5–15)
BUN: 30 mg/dL — ABNORMAL HIGH (ref 8–23)
CO2: 24 mmol/L (ref 22–32)
Calcium: 9.2 mg/dL (ref 8.9–10.3)
Chloride: 102 mmol/L (ref 98–111)
Creatinine, Ser: 1.34 mg/dL — ABNORMAL HIGH (ref 0.44–1.00)
GFR, Estimated: 45 mL/min — ABNORMAL LOW (ref >60)
Glucose, Bld: 127 mg/dL — ABNORMAL HIGH (ref 70–99)
Potassium: 4.1 mmol/L (ref 3.5–5.1)
Sodium: 134 mmol/L — ABNORMAL LOW (ref 135–145)
Total Bilirubin: 0.4 mg/dL (ref <1.2)
Total Protein: 7.4 g/dL (ref 6.5–8.1)

## 2023-09-24 LAB — CBC
HCT: 36 % (ref 36.0–46.0)
Hemoglobin: 11.4 g/dL — ABNORMAL LOW (ref 12.0–15.0)
MCH: 31.9 pg (ref 26.0–34.0)
MCHC: 31.7 g/dL (ref 30.0–36.0)
MCV: 100.8 fL — ABNORMAL HIGH (ref 80.0–100.0)
Platelets: 205 10*3/uL (ref 150–400)
RBC: 3.57 MIL/uL — ABNORMAL LOW (ref 3.87–5.11)
RDW: 12.7 % (ref 11.5–15.5)
WBC: 6.1 10*3/uL (ref 4.0–10.5)
nRBC: 0 % (ref 0.0–0.2)

## 2023-09-24 LAB — TYPE AND SCREEN
ABO/RH(D): A POS
Antibody Screen: NEGATIVE

## 2023-09-24 MED ORDER — ALVIMOPAN 12 MG PO CAPS
12.0000 mg | ORAL_CAPSULE | ORAL | Status: AC
  Administered 2023-09-25: 12 mg via ORAL
  Filled 2023-09-24: qty 1

## 2023-09-24 MED ORDER — PEG 3350-KCL-NA BICARB-NACL 420 G PO SOLR: 4000.0000 mL | Freq: Once | ORAL | Status: DC

## 2023-09-24 MED ORDER — CHLORHEXIDINE GLUCONATE CLOTH 2 % EX PADS
6.0000 | MEDICATED_PAD | Freq: Every day | CUTANEOUS | Status: DC
  Administered 2023-09-26: 6 via TOPICAL

## 2023-09-24 MED ORDER — SODIUM CHLORIDE 0.9% FLUSH
10.0000 mL | Freq: Two times a day (BID) | INTRAVENOUS | Status: DC
  Administered 2023-09-24 – 2023-09-26 (×2): 10 mL

## 2023-09-24 MED ORDER — PIPERACILLIN-TAZOBACTAM 3.375 G IVPB 30 MIN
3.3750 g | Freq: Once | INTRAVENOUS | Status: AC
  Administered 2023-09-24: 3.375 g via INTRAVENOUS
  Filled 2023-09-24: qty 50

## 2023-09-24 MED ORDER — PEG 3350-KCL-NA BICARB-NACL 420 G PO SOLR
4000.0000 mL | Freq: Once | ORAL | Status: AC
  Administered 2023-09-24: 4000 mL via ORAL
  Filled 2023-09-24: qty 4000

## 2023-09-24 MED ORDER — ESTRADIOL 0.5 MG PO TABS
1.0000 mg | ORAL_TABLET | Freq: Every day | ORAL | Status: DC
  Administered 2023-09-26: 1 mg via ORAL
  Filled 2023-09-24 (×2): qty 2

## 2023-09-24 MED ORDER — NEOMYCIN SULFATE 500 MG PO TABS
500.0000 mg | ORAL_TABLET | ORAL | Status: AC
  Administered 2023-09-24 – 2023-09-25 (×2): 500 mg via ORAL
  Filled 2023-09-24 (×2): qty 1

## 2023-09-24 MED ORDER — TOPIRAMATE 100 MG PO TABS
100.0000 mg | ORAL_TABLET | Freq: Every day | ORAL | Status: DC
  Administered 2023-09-26: 100 mg via ORAL
  Filled 2023-09-24: qty 1

## 2023-09-24 MED ORDER — POLYVINYL ALCOHOL 1.4 % OP SOLN: 1.0000 [drp] | Freq: Three times a day (TID) | OPHTHALMIC | Status: DC | PRN

## 2023-09-24 MED ORDER — ESCITALOPRAM OXALATE 10 MG PO TABS
5.0000 mg | ORAL_TABLET | Freq: Every day | ORAL | Status: DC
  Administered 2023-09-26: 5 mg via ORAL
  Filled 2023-09-24: qty 1

## 2023-09-24 MED ORDER — METRONIDAZOLE 500 MG PO TABS
500.0000 mg | ORAL_TABLET | ORAL | Status: AC
  Administered 2023-09-24 – 2023-09-25 (×2): 500 mg via ORAL
  Filled 2023-09-24 (×2): qty 1

## 2023-09-24 NOTE — Anesthesia Preprocedure Evaluation (Signed)
Anesthesia Evaluation  Patient identified by MRN, date of birth, ID band Patient awake    Reviewed: Allergy & Precautions, NPO status , Patient's Chart, lab work & pertinent test results  Airway Mallampati: II  TM Distance: >3 FB Neck ROM: Full    Dental no notable dental hx.    Pulmonary former smoker   Pulmonary exam normal        Cardiovascular negative cardio ROS  Rhythm:Regular Rate:Normal     Neuro/Psych  Headaches  Anxiety Depression       GI/Hepatic Neg liver ROS,GERD  ,,  Endo/Other  negative endocrine ROS    Renal/GU CRFRenal disease Bladder dysfunction      Musculoskeletal  (+) Arthritis , Osteoarthritis,    Abdominal Normal abdominal exam  (+)   Peds  Hematology  (+) Blood dyscrasia, anemia Lab Results      Component                Value               Date                      WBC                      5.0                 09/02/2023                HGB                      11.3 (L)            09/02/2023                HCT                      34.4 (L)            09/02/2023                MCV                      101.5 (H)           09/02/2023                PLT                      178                 09/02/2023             Lab Results      Component                Value               Date                      NA                       137                 09/02/2023                K  4.6                 09/02/2023                CO2                      24                  09/02/2023                GLUCOSE                  84                  09/02/2023                BUN                      29 (H)              09/02/2023                CREATININE               1.15 (H)            09/02/2023                CALCIUM                  8.9                 09/02/2023                EGFR                     49 (L)              10/04/2022                GFRNONAA                 54 (L)               09/02/2023              Anesthesia Other Findings   Reproductive/Obstetrics                             Anesthesia Physical Anesthesia Plan  ASA: 3  Anesthesia Plan: General   Post-op Pain Management: Celebrex PO (pre-op)* and Tylenol PO (pre-op)*   Induction: Intravenous  PONV Risk Score and Plan: 3 and Ondansetron, Dexamethasone, Midazolam and Treatment may vary due to age or medical condition  Airway Management Planned: Mask and Oral ETT  Additional Equipment: None  Intra-op Plan:   Post-operative Plan: Extubation in OR  Informed Consent: I have reviewed the patients History and Physical, chart, labs and discussed the procedure including the risks, benefits and alternatives for the proposed anesthesia with the patient or authorized representative who has indicated his/her understanding and acceptance.     Dental advisory given  Plan Discussed with: CRNA  Anesthesia Plan Comments:        Anesthesia Quick Evaluation

## 2023-09-25 ENCOUNTER — Other Ambulatory Visit: Payer: Self-pay

## 2023-09-25 ENCOUNTER — Encounter (HOSPITAL_COMMUNITY): Payer: Self-pay | Admitting: Urology

## 2023-09-25 ENCOUNTER — Encounter (HOSPITAL_COMMUNITY)
Admission: RE | Disposition: A | Discharge status: Home / Self Care | Payer: Self-pay | Source: Ambulatory Visit | Attending: Urology

## 2023-09-25 ENCOUNTER — Inpatient Hospital Stay (HOSPITAL_COMMUNITY): Payer: Managed Care, Other (non HMO) | Admitting: Medical

## 2023-09-25 ENCOUNTER — Inpatient Hospital Stay (HOSPITAL_COMMUNITY): Payer: Managed Care, Other (non HMO)

## 2023-09-25 ENCOUNTER — Inpatient Hospital Stay (HOSPITAL_COMMUNITY): Payer: Managed Care, Other (non HMO) | Admitting: Anesthesiology

## 2023-09-25 DIAGNOSIS — C679 Malignant neoplasm of bladder, unspecified: Secondary | ICD-10-CM

## 2023-09-25 HISTORY — PX: ROBOT ASSISTED LAPAROSCOPIC COMPLETE CYSTECT ILEAL CONDUIT: SHX5139

## 2023-09-25 LAB — HEMOGLOBIN AND HEMATOCRIT, BLOOD
HCT: 31.7 % — ABNORMAL LOW (ref 36.0–46.0)
Hemoglobin: 10.5 g/dL — ABNORMAL LOW (ref 12.0–15.0)

## 2023-09-25 LAB — ABO/RH: ABO/RH(D): A POS

## 2023-09-25 SURGERY — CYSTECTOMY, ROBOT-ASSISTED, WITH ILEAL CONDUIT CREATION
Anesthesia: General

## 2023-09-25 MED ORDER — PROPOFOL 10 MG/ML IV BOLUS
INTRAVENOUS | Status: AC
  Filled 2023-09-25: qty 20

## 2023-09-25 MED ORDER — KETAMINE HCL 10 MG/ML IJ SOLN
INTRAMUSCULAR | Status: DC | PRN
  Administered 2023-09-25: 10 mg via INTRAVENOUS
  Administered 2023-09-25: 20 mg via INTRAVENOUS

## 2023-09-25 MED ORDER — STERILE WATER FOR IRRIGATION IR SOLN
Status: DC | PRN
  Administered 2023-09-25: 3000 mL

## 2023-09-25 MED ORDER — CHLORHEXIDINE GLUCONATE 0.12 % MT SOLN
15.0000 mL | Freq: Once | OROMUCOSAL | Status: AC
  Administered 2023-09-25: 15 mL via OROMUCOSAL

## 2023-09-25 MED ORDER — FENTANYL CITRATE (PF) 250 MCG/5ML IJ SOLN
INTRAMUSCULAR | Status: DC | PRN
  Administered 2023-09-25: 25 ug via INTRAVENOUS
  Administered 2023-09-25 (×2): 50 ug via INTRAVENOUS
  Administered 2023-09-25: 25 ug via INTRAVENOUS
  Administered 2023-09-25: 100 ug via INTRAVENOUS

## 2023-09-25 MED ORDER — PROPOFOL 10 MG/ML IV BOLUS
INTRAVENOUS | Status: DC | PRN
  Administered 2023-09-25: 110 mg via INTRAVENOUS

## 2023-09-25 MED ORDER — SODIUM CHLORIDE (PF) 0.9 % IJ SOLN
INTRAMUSCULAR | Status: AC
  Filled 2023-09-25: qty 40

## 2023-09-25 MED ORDER — BUPIVACAINE LIPOSOME 1.3 % IJ SUSP
INTRAMUSCULAR | Status: DC | PRN
  Administered 2023-09-25: 20 mL

## 2023-09-25 MED ORDER — SENNOSIDES-DOCUSATE SODIUM 8.6-50 MG PO TABS
2.0000 | ORAL_TABLET | Freq: Every day | ORAL | Status: DC
  Administered 2023-09-25: 2 via ORAL
  Filled 2023-09-25: qty 2

## 2023-09-25 MED ORDER — ALVIMOPAN 12 MG PO CAPS
12.0000 mg | ORAL_CAPSULE | Freq: Two times a day (BID) | ORAL | Status: DC
  Filled 2023-09-25: qty 1

## 2023-09-25 MED ORDER — SODIUM CHLORIDE 0.45 % IV SOLN: INTRAVENOUS | Status: AC

## 2023-09-25 MED ORDER — LIDOCAINE 2% (20 MG/ML) 5 ML SYRINGE
INTRAMUSCULAR | Status: DC | PRN
  Administered 2023-09-25: 60 mg via INTRAVENOUS
  Administered 2023-09-25: 1.5 mg/kg/h via INTRAVENOUS

## 2023-09-25 MED ORDER — PIPERACILLIN-TAZOBACTAM 3.375 G IVPB 30 MIN
3.3750 g | Freq: Once | INTRAVENOUS | Status: AC
  Administered 2023-09-25: 3.375 g via INTRAVENOUS

## 2023-09-25 MED ORDER — OXYCODONE HCL 5 MG PO TABS
5.0000 mg | ORAL_TABLET | ORAL | Status: DC | PRN
  Administered 2023-09-25 – 2023-09-26 (×4): 5 mg via ORAL
  Filled 2023-09-25 (×4): qty 1

## 2023-09-25 MED ORDER — ONDANSETRON HCL 4 MG/2ML IJ SOLN: 4.0000 mg | INTRAMUSCULAR | Status: DC | PRN

## 2023-09-25 MED ORDER — FENTANYL CITRATE PF 50 MCG/ML IJ SOSY
PREFILLED_SYRINGE | INTRAMUSCULAR | Status: AC
  Filled 2023-09-25: qty 1

## 2023-09-25 MED ORDER — HYOSCYAMINE SULFATE 0.125 MG SL SUBL: 0.1250 mg | SUBLINGUAL_TABLET | SUBLINGUAL | Status: DC | PRN

## 2023-09-25 MED ORDER — MIDAZOLAM HCL 2 MG/2ML IJ SOLN
INTRAMUSCULAR | Status: DC | PRN
  Administered 2023-09-25 (×2): 1 mg via INTRAVENOUS

## 2023-09-25 MED ORDER — LIDOCAINE HCL 2 % IJ SOLN
INTRAMUSCULAR | Status: AC
  Filled 2023-09-25: qty 20

## 2023-09-25 MED ORDER — ACETAMINOPHEN 500 MG PO TABS
1000.0000 mg | ORAL_TABLET | Freq: Four times a day (QID) | ORAL | Status: AC
  Administered 2023-09-25 – 2023-09-26 (×4): 1000 mg via ORAL
  Filled 2023-09-25 (×4): qty 2

## 2023-09-25 MED ORDER — ORAL CARE MOUTH RINSE: 15.0000 mL | Freq: Once | OROMUCOSAL | Status: AC

## 2023-09-25 MED ORDER — EPHEDRINE 5 MG/ML INJ
INTRAVENOUS | Status: AC
  Filled 2023-09-25: qty 5

## 2023-09-25 MED ORDER — ONDANSETRON HCL 4 MG/2ML IJ SOLN
INTRAMUSCULAR | Status: DC | PRN
  Administered 2023-09-25: 4 mg via INTRAVENOUS

## 2023-09-25 MED ORDER — FENTANYL CITRATE (PF) 250 MCG/5ML IJ SOLN
INTRAMUSCULAR | Status: AC
  Filled 2023-09-25: qty 5

## 2023-09-25 MED ORDER — LACTATED RINGERS IV SOLN
INTRAVENOUS | Status: DC
Start: 2023-09-25 — End: 2023-09-25

## 2023-09-25 MED ORDER — DIPHENHYDRAMINE HCL 12.5 MG/5ML PO ELIX: 12.5000 mg | ORAL_SOLUTION | Freq: Four times a day (QID) | ORAL | Status: DC | PRN

## 2023-09-25 MED ORDER — WATER FOR IRRIGATION, STERILE IR SOLN
Status: DC | PRN
  Administered 2023-09-25: 1000 mL

## 2023-09-25 MED ORDER — PIPERACILLIN-TAZOBACTAM 3.375 G IVPB
INTRAVENOUS | Status: AC
  Filled 2023-09-25: qty 50

## 2023-09-25 MED ORDER — ROCURONIUM BROMIDE 10 MG/ML (PF) SYRINGE
PREFILLED_SYRINGE | INTRAVENOUS | Status: AC
Start: 2023-09-25 — End: ?
  Filled 2023-09-25: qty 10

## 2023-09-25 MED ORDER — EPHEDRINE SULFATE-NACL 50-0.9 MG/10ML-% IV SOSY
PREFILLED_SYRINGE | INTRAVENOUS | Status: DC | PRN
  Administered 2023-09-25: 5 mg via INTRAVENOUS

## 2023-09-25 MED ORDER — ACETAMINOPHEN 500 MG PO TABS
1000.0000 mg | ORAL_TABLET | Freq: Once | ORAL | Status: AC
  Administered 2023-09-25: 1000 mg via ORAL
  Filled 2023-09-25: qty 2

## 2023-09-25 MED ORDER — MIDAZOLAM HCL 2 MG/2ML IJ SOLN
INTRAMUSCULAR | Status: AC
  Filled 2023-09-25: qty 2

## 2023-09-25 MED ORDER — OXYCODONE-ACETAMINOPHEN 2.5-325 MG PO TABS: 2.0000 | ORAL_TABLET | ORAL | 0 refills | Status: DC | PRN

## 2023-09-25 MED ORDER — KETAMINE HCL 50 MG/5ML IJ SOSY
PREFILLED_SYRINGE | INTRAMUSCULAR | Status: AC
Start: 2023-09-25 — End: ?
  Filled 2023-09-25: qty 5

## 2023-09-25 MED ORDER — DOCUSATE SODIUM 100 MG PO CAPS: 100.0000 mg | ORAL_CAPSULE | Freq: Two times a day (BID) | ORAL | Status: DC

## 2023-09-25 MED ORDER — DIPHENHYDRAMINE HCL 50 MG/ML IJ SOLN: 12.5000 mg | Freq: Four times a day (QID) | INTRAMUSCULAR | Status: DC | PRN

## 2023-09-25 MED ORDER — ONDANSETRON HCL 4 MG/2ML IJ SOLN
INTRAMUSCULAR | Status: AC
Start: 2023-09-25 — End: ?
  Filled 2023-09-25: qty 2

## 2023-09-25 MED ORDER — SODIUM CHLORIDE 0.9 % IR SOLN
Status: DC | PRN
  Administered 2023-09-25: 1000 mL

## 2023-09-25 MED ORDER — FENTANYL CITRATE PF 50 MCG/ML IJ SOSY
25.0000 ug | PREFILLED_SYRINGE | INTRAMUSCULAR | Status: DC | PRN
  Administered 2023-09-25 (×3): 50 ug via INTRAVENOUS

## 2023-09-25 MED ORDER — DEXAMETHASONE SODIUM PHOSPHATE 10 MG/ML IJ SOLN
INTRAMUSCULAR | Status: DC | PRN
  Administered 2023-09-25: 5 mg via INTRAVENOUS

## 2023-09-25 MED ORDER — STERILE WATER FOR INJECTION IJ SOLN
INTRAMUSCULAR | Status: DC | PRN
  Administered 2023-09-25: 2 mL via INTRAMUSCULAR

## 2023-09-25 MED ORDER — SUGAMMADEX SODIUM 200 MG/2ML IV SOLN
INTRAVENOUS | Status: DC | PRN
  Administered 2023-09-25: 130 mg via INTRAVENOUS

## 2023-09-25 MED ORDER — LIDOCAINE HCL (PF) 2 % IJ SOLN
INTRAMUSCULAR | Status: AC
  Filled 2023-09-25: qty 5

## 2023-09-25 MED ORDER — HYDROMORPHONE HCL 1 MG/ML IJ SOLN
0.5000 mg | INTRAMUSCULAR | Status: DC | PRN
  Administered 2023-09-26: 1 mg via INTRAVENOUS
  Filled 2023-09-25: qty 1

## 2023-09-25 MED ORDER — ROCURONIUM BROMIDE 10 MG/ML (PF) SYRINGE
PREFILLED_SYRINGE | INTRAVENOUS | Status: DC | PRN
  Administered 2023-09-25: 60 mg via INTRAVENOUS
  Administered 2023-09-25: 10 mg via INTRAVENOUS

## 2023-09-25 MED ORDER — DEXAMETHASONE SODIUM PHOSPHATE 10 MG/ML IJ SOLN
INTRAMUSCULAR | Status: AC
  Filled 2023-09-25: qty 1

## 2023-09-25 MED ORDER — LACTATED RINGERS IV SOLN: INTRAVENOUS | Status: DC | PRN

## 2023-09-25 MED ORDER — SODIUM CHLORIDE (PF) 0.9 % IJ SOLN
INTRAMUSCULAR | Status: AC
  Filled 2023-09-25: qty 20

## 2023-09-25 MED ORDER — BUPIVACAINE LIPOSOME 1.3 % IJ SUSP
INTRAMUSCULAR | Status: AC
  Filled 2023-09-25: qty 20

## 2023-09-25 MED ORDER — CELECOXIB 200 MG PO CAPS
200.0000 mg | ORAL_CAPSULE | Freq: Once | ORAL | Status: AC
  Administered 2023-09-25: 200 mg via ORAL
  Filled 2023-09-25: qty 1

## 2023-09-25 MED ORDER — SODIUM CHLORIDE (PF) 0.9 % IJ SOLN
INTRAMUSCULAR | Status: DC | PRN
  Administered 2023-09-25: 20 mL

## 2023-09-25 SURGICAL SUPPLY — 93 items
APPLICATOR COTTON TIP 6 STRL (MISCELLANEOUS) ×1 IMPLANT
APPLICATOR COTTON TIP 6IN STRL (MISCELLANEOUS) ×2
APPLICATOR SURGIFLO ENDO (HEMOSTASIS) IMPLANT
BAG COUNTER SPONGE SURGICOUNT (BAG) IMPLANT
BAG LAPAROSCOPIC 12 15 PORT 16 (BASKET) ×1 IMPLANT
BAG RETRIEVAL 12/15 (BASKET) ×2
BENZOIN TINCTURE PRP APPL 2/3 (GAUZE/BANDAGES/DRESSINGS) ×2 IMPLANT
BLADE SURG SZ10 CARB STEEL (BLADE) IMPLANT
CATH SILICONE 5CC 18FR (INSTRUMENTS) ×2 IMPLANT
CHLORAPREP W/TINT 26 (MISCELLANEOUS) ×2 IMPLANT
CLIP LIGATING HEM O LOK PURPLE (MISCELLANEOUS) ×6 IMPLANT
CLIP LIGATING HEMO LOK XL GOLD (MISCELLANEOUS) ×4 IMPLANT
CLIP LIGATING HEMO O LOK GREEN (MISCELLANEOUS) ×2 IMPLANT
CNTNR URN SCR LID CUP LEK RST (MISCELLANEOUS) ×2 IMPLANT
COVER MAYO STAND STRL (DRAPES) ×2 IMPLANT
COVER SURGICAL LIGHT HANDLE (MISCELLANEOUS) ×2 IMPLANT
COVER TIP SHEARS 8 DVNC (MISCELLANEOUS) ×2 IMPLANT
DERMABOND ADVANCED .7 DNX12 (GAUZE/BANDAGES/DRESSINGS) ×4 IMPLANT
DRAIN CHANNEL RND F F (WOUND CARE) IMPLANT
DRAIN PENROSE 0.5X18 (DRAIN) IMPLANT
DRAPE ARM DVNC X/XI (DISPOSABLE) ×8 IMPLANT
DRAPE COLUMN DVNC XI (DISPOSABLE) ×2 IMPLANT
DRAPE SURG IRRIG POUCH 19X23 (DRAPES) ×2 IMPLANT
DRIVER NDL LRG 8 DVNC XI (INSTRUMENTS) ×2 IMPLANT
DRIVER NDLE LRG 8 DVNC XI (INSTRUMENTS) ×4
DRSG TEGADERM 4X4.75 (GAUZE/BANDAGES/DRESSINGS) ×2 IMPLANT
ELECT PENCIL ROCKER SW 15FT (MISCELLANEOUS) ×2 IMPLANT
ELECT REM PT RETURN 15FT ADLT (MISCELLANEOUS) ×2 IMPLANT
FORCEPS BPLR LNG DVNC XI (INSTRUMENTS) ×2 IMPLANT
FORCEPS PROGRASP DVNC XI (FORCEP) ×2 IMPLANT
GAUZE 4X4 16PLY ~~LOC~~+RFID DBL (SPONGE) ×1 IMPLANT
GLOVE BIO SURGEON STRL SZ 6.5 (GLOVE) ×4 IMPLANT
GLOVE BIOGEL PI IND STRL 7.5 (GLOVE) IMPLANT
GLOVE SURG LX STRL 7.5 STRW (GLOVE) ×4 IMPLANT
GOWN STRL REUS W/ TWL XL LVL3 (GOWN DISPOSABLE) ×4 IMPLANT
GOWN STRL SURGICAL XL XLNG (GOWN DISPOSABLE) ×4 IMPLANT
GRASPER SUT TROCAR 14GX15 (MISCELLANEOUS) ×1 IMPLANT
GUIDEWIRE ANG ZIPWIRE 038X150 (WIRE) ×1 IMPLANT
IRRIG SUCT STRYKERFLOW 2 WTIP (MISCELLANEOUS) ×2
IRRIGATION SUCT STRKRFLW 2 WTP (MISCELLANEOUS) ×1 IMPLANT
KIT PROCEDURE DVNC SI (MISCELLANEOUS) ×1 IMPLANT
KIT TURNOVER KIT A (KITS) IMPLANT
LOOP VESSEL MAXI BLUE (MISCELLANEOUS) ×2 IMPLANT
NDL ASPIRATION 22 (NEEDLE) ×1 IMPLANT
NDL INSUFFLATION 14GA 120MM (NEEDLE) ×1 IMPLANT
NEEDLE ASPIRATION 22 (NEEDLE) ×2
NEEDLE INSUFFLATION 14GA 120MM (NEEDLE) ×2
PACK ROBOT UROLOGY CUSTOM (CUSTOM PROCEDURE TRAY) ×2 IMPLANT
PAD POSITIONING PINK XL (MISCELLANEOUS) ×2 IMPLANT
PORT ACCESS TROCAR AIRSEAL 12 (TROCAR) ×2 IMPLANT
RELOAD STAPLE 60 2.6 WHT THN (STAPLE) ×3 IMPLANT
RELOAD STAPLE 60 4.1 GRN THCK (STAPLE) ×3 IMPLANT
RETRACTOR LONRSTAR 16.6X16.6CM (MISCELLANEOUS) IMPLANT
RETRACTOR STAY HOOK 5MM (MISCELLANEOUS) IMPLANT
RETRACTOR STER APS 16.6X16.6CM (MISCELLANEOUS)
RETRACTOR WND ALEXIS 18 MED (MISCELLANEOUS) ×1 IMPLANT
RTRCTR WOUND ALEXIS 18CM MED (MISCELLANEOUS) ×2
SCISSORS MNPLR CVD DVNC XI (INSTRUMENTS) ×2 IMPLANT
SEAL UNIV 5-12 XI (MISCELLANEOUS) ×8 IMPLANT
SET CYSTO W/LG BORE CLAMP LF (SET/KITS/TRAYS/PACK) IMPLANT
SET TRI-LUMEN FLTR TB AIRSEAL (TUBING) ×2 IMPLANT
SOL ELECTROSURG ANTI STICK (MISCELLANEOUS) ×2
SOLUTION ELECTROSURG ANTI STCK (MISCELLANEOUS) ×1 IMPLANT
SPIKE FLUID TRANSFER (MISCELLANEOUS) ×2 IMPLANT
SPONGE T-LAP 18X18 ~~LOC~~+RFID (SPONGE) ×2 IMPLANT
SPONGE T-LAP 4X18 ~~LOC~~+RFID (SPONGE) ×1 IMPLANT
STAPLER ECHELON LONG 60 440 (INSTRUMENTS) ×2 IMPLANT
STAPLER RELOAD GREEN 60MM (STAPLE) ×6
STAPLER RELOAD WHITE 60MM (STAPLE) ×6
STENT CONTOUR 8FR X 24 (STENTS) ×1 IMPLANT
STENT SET URETHERAL LEFT 7FR (STENTS) ×1 IMPLANT
STENT SET URETHERAL RIGHT 7FR (STENTS) ×1 IMPLANT
SURGIFLO W/THROMBIN 8M KIT (HEMOSTASIS) IMPLANT
SUT CHROMIC 4 0 RB 1X27 (SUTURE) ×2 IMPLANT
SUT ETHILON 3 0 PS 1 (SUTURE) ×2 IMPLANT
SUT MNCRL AB 4-0 PS2 18 (SUTURE) ×4 IMPLANT
SUT PDS AB 1 CT1 27 (SUTURE) ×6 IMPLANT
SUT SILK 3 0 SH 30 (SUTURE) IMPLANT
SUT SILK 3 0 SH CR/8 (SUTURE) ×2 IMPLANT
SUT VIC AB 2-0 CT1 27XBRD (SUTURE) IMPLANT
SUT VIC AB 2-0 SH 18 (SUTURE) IMPLANT
SUT VIC AB 2-0 UR5 27 (SUTURE) ×8 IMPLANT
SUT VIC AB 3-0 SH 27X BRD (SUTURE) ×4 IMPLANT
SUT VIC AB 3-0 SH 27XBRD (SUTURE) ×6 IMPLANT
SUT VIC AB 4-0 RB1 27XBRD (SUTURE) ×8 IMPLANT
SUT VLOC BARB 180 ABS3/0GR12 (SUTURE)
SUTURE VLOC BRB 180 ABS3/0GR12 (SUTURE) ×1 IMPLANT
SYR CONTROL 10ML LL (SYRINGE) ×2 IMPLANT
SYS KII OPTICAL ACCESS 15MM (TROCAR) ×2
SYSTEM KII OPTICAL ACCESS 15MM (TROCAR) ×1 IMPLANT
SYSTEM UROSTOMY GENTLE TOUCH (WOUND CARE) ×1 IMPLANT
TUBING CONNECTING 10 (TUBING) ×2 IMPLANT
WATER STERILE IRR 1000ML POUR (IV SOLUTION) ×2 IMPLANT

## 2023-09-25 NOTE — H&P (Signed)
Barbara [name] is an 62 y.o. female.    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 Left JJ stent in place. Single ureters bilaterally. S/p 4 cycles neoadjuvant chemo with Barbara Barter MD from med-onc at [facility].   2 - Left Malignancy Hydro / Stage 3a Renal Insufficiency - GFR upper 40s after stenting.   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 Her friend Barbara [name] is very involved. Her PCP is Barbara [name].   Today " Barbara [name] " is seen to proceed with cystectomy / node dissection / conduit diversion / cysto-ICG for bladder cancer. NO interval fevers. Hgb 11, Cr 1.3. Completed bowel prep to clear.   Past Medical History:  Diagnosis Date   Adopted    Allergic rhinitis    Anemia    h/o   Anxiety    Arthritis    B12 deficiency    Bilateral carotid artery stenosis    Bilateral carotid bruits    Bladder tumor    Cancer (HCC)    bladder cancer   Depression    Gallbladder polyp    GERD (gastroesophageal reflux disease)    Heart murmur    Hydronephrosis of left kidney    Migraines    Tobacco abuse    UTI (urinary tract infection)     Past Surgical History:  Procedure Laterality Date   ABDOMINAL HYSTERECTOMY     BLADDER SURGERY     BREAST BIOPSY Left 2014   neg   CYSTOSCOPY W/ URETERAL STENT PLACEMENT Left 04/04/2023   Procedure: CYSTOSCOPY WITH RETROGRADE PYELOGRAM/URETERAL STENT PLACEMENT;  Surgeon: Barbara Scotland, MD;  Location: [facility] ORS;  Service: Urology;  Laterality: Left;   FOOT SURGERY Right    2009   IR IMAGING GUIDED PORT INSERTION  05/03/2023   LAPAROSCOPIC APPENDECTOMY  12/05/2012   NASAL SEPTUM SURGERY     SISTRUNK PROCEDURE  03/03/2012   Thyroglossal duct cyst excision    TONSILLECTOMY     TRANSURETHRAL RESECTION OF BLADDER TUMOR N/A 04/04/2023   Procedure: TRANSURETHRAL RESECTION OF BLADDER TUMOR (TURBT);  Surgeon: Barbara Scotland, MD;  Location: [facility] ORS;  Service: Urology;  Laterality: N/A;    Family History  Adopted: Yes   Social History:  reports that she has quit smoking. Her smoking use included cigarettes. She has a 7.5 pack-year smoking history. She has been exposed to tobacco smoke. She has never used smokeless tobacco. She reports that she does not drink alcohol and does not use drugs.  Allergies:  Allergies  Allergen Reactions   Pseudoephedrine Hcl Other (See Comments)    "Jittery"    Medications Prior to Admission  Medication Sig Dispense Refill   carboxymethylcellulose (REFRESH PLUS) 0.5 % SOLN Place 1 drop into both eyes 3 (three) times daily as needed (dry eyes).     escitalopram (LEXAPRO) 5 MG tablet Take 5 mg by mouth daily.     estradiol (ESTRACE) 1 MG tablet TAKE 1 TABLET BY MOUTH DAILY 90 tablet 3   ondansetron (ZOFRAN) 8 MG tablet Take 1 tablet (8 mg total) by mouth every 8 (eight) hours as needed for up to 30 doses for nausea or vomiting. 30 tablet 0   topiramate (TOPAMAX) 50 MG tablet Take 2 tablets (100 mg  total) by mouth at bedtime. 180 tablet 3   celecoxib (CELEBREX) 200 MG capsule TAKE 1 CAPSULE BY MOUTH DAILY 90 capsule 3   dicyclomine (BENTYL) 10 MG capsule Take 1 capsule (10 mg total) by mouth 3 (three) times daily as needed for up to 30 doses for spasms. (Patient not taking: Reported on 09/17/2023) 30 capsule 0   lidocaine-prilocaine (EMLA) cream Apply 1 Application topically as needed. (Patient not taking: Reported on 09/17/2023) 30 g 0   oxybutynin (DITROPAN) 5 MG tablet Take 1 tablet (5 mg total) by mouth every 8 (eight) hours as needed for bladder spasms. (Patient not taking: Reported on 07/01/2023) 30 tablet 0   oxycodone-acetaminophen (PERCOCET) 2.5-325 MG tablet Take 1 tablet by mouth every 6 (six) hours as needed for  pain. (Patient not taking: Reported on 09/17/2023) 30 tablet 0   potassium chloride SA (KLOR-CON M) 20 MEQ tablet Take 1 tablet (20 mEq total) by mouth daily for 2 days. (Patient not taking: Reported on 07/01/2023) 2 tablet 0   prochlorperazine (COMPAZINE) 10 MG tablet Take 1 tablet (10 mg total) by mouth every 6 (six) hours as needed for nausea or vomiting. (Patient not taking: Reported on 09/02/2023) 30 tablet 0   trimethoprim (TRIMPEX) 100 MG tablet Take 1 tablet (100 mg total) by mouth daily. (Patient not taking: Reported on 07/01/2023) 30 tablet 11    Results for orders placed or performed during the hospital encounter of 09/24/23 (from the past 48 hour(s))  Type and screen     Status: None   Collection Time: 09/24/23  8:24 PM  Result Value Ref Range   ABO/RH(D) A POS    Antibody Screen NEG    Sample Expiration      09/27/2023,2359 Performed at [facility], [address], [place], [place] [address]   CBC     Status: Abnormal   Collection Time: 09/24/23  8:24 PM  Result Value Ref Range   WBC 6.1 4.0 - 10.5 K/uL   RBC 3.57 (L) 3.87 - 5.11 MIL/uL   Hemoglobin 11.4 (L) 12.0 - 15.0 g/dL   HCT 60.4 54.0 - 98.1 %   MCV 100.8 (H) 80.0 - 100.0 fL   MCH 31.9 26.0 - 34.0 pg   MCHC 31.7 30.0 - 36.0 g/dL   RDW 19.1 47.8 - 29.5 %   Platelets 205 150 - 400 K/uL   nRBC 0.0 0.0 - 0.2 %    Comment: Performed at [facility], [address], [place], [place] [address]  Comprehensive metabolic panel     Status: Abnormal   Collection Time: 09/24/23  8:24 PM  Result Value Ref Range   Sodium 134 (L) 135 - 145 mmol/L   Potassium 4.1 3.5 - 5.1 mmol/L   Chloride 102 98 - 111 mmol/L   CO2 24 22 - 32 mmol/L   Glucose, Bld 127 (H) 70 - 99 mg/dL    Comment: Glucose reference range applies only to samples taken after fasting for at least 8 hours.   BUN 30 (H) 8 - 23 mg/dL   Creatinine, Ser 8.65 (H) 0.44 - 1.00 mg/dL   Calcium 9.2 8.9 - 78.4 mg/dL   Total Protein  7.4 6.5 - 8.1 g/dL   Albumin 3.9 3.5 - 5.0 g/dL   AST 19 15 - 41 U/L   ALT 12 0 - 44 U/L   Alkaline Phosphatase 47 38 - 126 U/L   Total Bilirubin 0.4 <1.2 mg/dL   GFR, Estimated  45 (L) >60 mL/min    Comment: (NOTE) Calculated using the CKD-EPI Creatinine Equation (2021)    Anion gap 8 5 - 15    Comment: Performed at [facility], [address] [place], [place] [address]   No results found.  Review of Systems  Constitutional:  Negative for chills and fever.  All other systems reviewed and are negative.   Blood pressure 125/60, pulse 70, temperature 97.7 F (36.5 C), temperature source Oral, resp. rate 20, height 5' 5.54" (1.665 m), weight 53.5 kg, SpO2 98%. Physical Exam Vitals reviewed.  HENT:     Head: Normocephalic.  Eyes:     Pupils: Pupils are equal, round, and reactive to light.  Pulmonary:     Effort: Pulmonary effort is normal.  Abdominal:     General: Abdomen is flat.     Palpations: Abdomen is soft.     Comments: Stable mild truncal obesity. Rt sided stomal marking site noted.   Genitourinary:    Comments: No CVAT at present.  Musculoskeletal:        General: Normal range of motion.     Cervical back: Normal range of motion.  Skin:    General: Skin is warm.  Neurological:     General: No focal deficit present.     Mental Status: She is alert.  Psychiatric:        Mood and Affect: Mood normal.      Assessment/Plan  Proceed as planned with cysto-ICG, cystectomy, node dissection, conduit diversion, BSO (has at least Rt ovary still and post-menaupsal). Risks, benefits, alternatives, expected peri-op course discussed previously and reiterated today.   Loletta Parish., MD 09/25/2023, 7:40 AM

## 2023-09-25 NOTE — Op Note (Signed)
Barbara [name], WARLEY MEDICAL RECORD NO: [id number] ACCOUNT NO: [id number] DATE OF BIRTH: 01-25-1961 FACILITY: [facility]ATION: [id number] PHYSICIAN: Sebastian Ache, MD  Operative Report   DATE OF PROCEDURE: 09/25/2023  PREOPERATIVE DIAGNOSIS:  Muscle invasive bladder cancer.  POSTOPERATIVE DIAGNOSIS:  Stage IV bladder cancer.  PROCEDURE: 1.  Cystoscopy with injection of indocyanine green dye, left retrograde pyelogram interpretation. 2.  Exchange of left ureteral stent. 3.  Robotic-assisted laparoscopic extensive adhesiolysis and peritoneal biopsies.  ESTIMATED BLOOD LOSS:  20 mL  COMPLICATIONS:  None.  SPECIMENS:  Peritoneal surface biopsies for pathology.  FIRST ASSISTANT:  Harrie Foreman, PA  FINDINGS: 1.  Small amount of residual intraluminal likely bladder tumor in the left lateral wall on cystoscopy. 2.  Successful replacement of left ureteral stent, proximal end in renal pelvis, distal end in urinary bladder. 3.  Extensive very dense malignant adhesions to multiple discontiguous loops of the small bowel as well as likely sigmoid colon in the deep pelvis. 4.  Bilateral ovaries visualized. 5.  Biopsy confirmed extensive extravesical disease.  INDICATIONS:  The patient is a very pleasant 62 year old lady with extensive smoking history.  She was found to have muscle invasive bladder cancer with malignant hydronephrosis by our colleagues in [place].  She appropriately underwent transurethral  resection, stenting of her malignant hydronephrosis and then adjuvant chemotherapy under the care of our medical oncology colleagues at [facility] in preparation for curative intent cystectomy.  She tolerated her chemo acceptably.  Most recent  imaging without any obvious distant disease, although still some significant enhancement of the bladder as well as some of the prior vaginal cuff area.  Options were discussed including continue with curative path with cystectomy and pelvic  exenteration  and bilateral salpingo-oophorectomy as she has already had hysterectomy and urinary diversion and she wished to proceed.  She has been counseled by multiple providers about options.  She was admitted yesterday for bowel prep, stoma marking, and labs  which were all acceptable. Informed consent was obtained and placed in medical record.   PROCEDURE IN DETAIL:  The patient being verified, procedure being cystoscopy with ICG, robotic cystectomy, conduit diversion, and node dissection was confirmed.  Procedure timeout was performed.  Intravenous antibiotics administered.  General  endotracheal anesthesia was induced.  The patient was placed into a low lithotomy position.  Sterile field was created, prepped and draped the patient's vagina, introitus and proximal thighs using iodine.  She was further fashioned to operating table  using 3-inch tape with foam padding across supraxiphoid chest.  A test of steep Trendelenburg positioning was performed and found to be suitably positioned.  Her chemo port was accessed and well padded.  Arms stuck to the side with gel rolls.  Sequential  compression devices were placed and verified.  An LAVH type drape was placed after prepping and draping the patient's entire abdomen using chlorhexidine gluconate and her vagina, introitus and proximal thighs using iodine.  Cystourethroscopy was  performed using 24-French injection scope set with 0-degree lens.  Inspection of the urinary bladder did reveal some residual, mostly nodular, likely tumor, it is relatively small volume on the left lateral wall of the bladder with some fibrinous debris  in the distal end of the left ureteral stent in situ in expected position.  2 mL of indocyanine green dye was injected across approximately 5 submucosal blebs in the area surrounding the tumor for sentinel lymphangiography.  A new silicone Foley catheter was placed per  urethra for straight drain, 10 mL  of water in the balloon.   Next, a high-flow, low-pressure pneumoperitoneum was obtained using Veress technique in the supraumbilical midline having passed the aspiration and drop test.  An 8-mm robotic camera port placed  in the location.  Laparoscopic examination of peritoneal cavity immediately revealed significant adhesions in the pelvis.  At this point, a large sheet of omentum was visualized coursing towards the area of the bladder dome and several loops of small bowel.   Otherwise, unremarkable.  Additional ports were placed as follows:  Left paramedian 8 mm robotic port, left far lateral 8 mm robotic port, right paramedian 15 mm assistant port at the previously marked stomal site, right paramedian 8 mm robotic port,  right far lateral 12 mm AirSeal assistant port.  Robot was docked, having passed the electronic checks.  Next, attention was directed at exposure / development of the pelvis and adhesiolysis.  With further careful inspection, the adhesions of the pelvis were noted to be very,  very extensive.  At first, I felt that it was most likely due to her previous hysterectomy and some areas of loose adhesions were carefully taken down, mostly the omentum and several loose adhesions of the small bowel.  Towards the area of the bladder  dome and vaginal cuff, however, the adhesions became incredibly, incredibly dense and the peritoneal surfaces were very obscured and nodular and friable.  This is highly concerning for locally advanced disease with extravesical disease and likely some  direct invasion into small bowel.  This was a quite concerning finding.  Additional adhesiolysis was carefully performed in areas that were amenable to it and most of the adhesions were taken down, erring in planes to avoid any enterotomies,  which did not occur grossly.  Having better exposed the interface between the bladder dome, vaginal cuff, and bowel, several peritoneal biopsies were taken.  This unfortunately did confirm metastatic  carcinoma.  Additional malignant adhesions remained  involving discontiguous loops of small bowel as well as some areas of descending colon.  I considered options very carefully including possible abandoning the procedure today versus proceeding with a very heroic attempt at extirpation, understanding this  would require likely significant bowel resection including possibly even some colon resection and as these loops of bowel were discontiguous, I felt the amount of resection would be quite extensive and possibly create a very high chance of anastomotic  failures or problem from small gut and I felt that path was quite frankly excessively risky and essentially have zero chance of cure.  I called the patient's son, Tresa Endo, by phone, discussed with him the intraoperative findings and that I felt that no  surgery would likely be able to render the patient cancer-free and that proceeding with heroic extirpation would likely entail risks much more substantial than previously planned.  I discussed options of aborting a cystectomy.  We are just exchanging her  left ureteral stent, changing goals more towards palliative therapy versus proceeding with radical extirpation, understanding the tremendous risks.  Son and I agreed the most prudent path was to abort the procedure today given all the risks and benefits  and son's knowledge of the patient's wishes.  As such, all areas utilized were very carefully inspected once again.  No enterotomies were identified.  Given the significant amount of adhesiolysis, I placed a closed suction drain at the previous left  lateral most robotic port site into the deep pelvis within the peritoneal cavity.  The robot was then undocked.  The previous right  12-mm assistant port site and 15-mm assistant port site were closed at the fascia using Carter-Thomason suture passer and figure-of-eight  PDS.  All incision sites were infiltrated with dilute lipolyzed Marcaine and closed at the  level of the skin using subcuticular Monocryl followed by Dermabond.  I felt it would be prudent to exchange the left ureteral stent as she does have some  malignant hydronephrosis and her stent was visualized to be somewhat encrusted previously on cystoscopy today.  Her Foley catheter was once again removed.  Cystourethroscopy was then again performed using a 21-French rigid cystoscope.  The distal end of  the stent was grasped.  It was removed in entirety and set aside for discard.  It was inspected and intact.  There was moderate encrustation of the distal end.  The left ureteral orifice was cannulated with a 6-French end-hole catheter and left  retrograde pyelogram was obtained.   Left retrograde pyelogram demonstrated single left ureter and single system left kidney.  There was some mass effect on the distal ureter consistent with some mild to moderate malignant hydronephrosis or obstruction rather.  There was minimal  hydronephrosis noted.  A 0.038 ZIPwire was advanced to the level of upper pole and a new 8 x 24 Contour type stent was carefully placed using fluoroscopic guidance.  Foley catheter was placed.  The procedure was terminated.  The patient tolerated the  procedure well, no immediate perioperative complications.  The patient was taken to postanesthesia care in stable condition.  I will discuss the intraoperative findings with the patient and further management options including consideration of radiation  versus transition towards more palliative goals.    SHW D: 09/25/2023 10:58:04 am T: 09/25/2023 1:27:00 pm  JOB: [id number]/ [id number]

## 2023-09-25 NOTE — Transfer of Care (Signed)
Immediate Anesthesia Transfer of Care Note  Patient: Barbara [name]  Procedure(s) Performed: XI ROBOTIC ASSISTED LAPAROSCOPIC EXTENSIVE LYSIS OF ADHESIONS, PERITONEAL BIOPSIES CYSTOSCOPY WITH ICG, LEFT RETROGRADE PYELOGRAM, LEFT URETERAL STENT EXCHANGE  Patient Location: PACU  Anesthesia Type:General  Level of Consciousness: awake  Airway & Oxygen Therapy: Patient Spontanous Breathing and Patient connected to face mask oxygen  Post-op Assessment: Report given to RN and Post -op Vital signs reviewed and stable  Post vital signs: Reviewed and stable  Last Vitals:  Vitals Value Taken Time  BP 148/84 09/25/23 1058  Temp    Pulse 97 09/25/23 1059  Resp 9 09/25/23 1059  SpO2 100 % 09/25/23 1059  Vitals shown include unfiled device data.  Last Pain:  Vitals:   09/25/23 0733  TempSrc:   PainSc: 0-No pain      Patients Stated Pain Goal: 6 (09/25/23 0733)  Complications: No notable events documented.

## 2023-09-25 NOTE — Discharge Instructions (Signed)
Activity:  You are encouraged to ambulate frequently (about every hour during waking hours) to help prevent blood clots from forming in your legs or lungs.  However, you should not engage in any heavy lifting (> 10-15 lbs), strenuous activity, or straining. Diet: You should advance your diet as instructed by your physician.  It will be normal to have some bloating, nausea, and abdominal discomfort intermittently. Prescriptions:  You will be provided a prescription for pain medication to take as needed.  If your pain is not severe enough to require the prescription pain medication, you may take extra strength Tylenol instead which will have less side effects.  You should also take a prescribed stool softener to avoid straining with bowel movements as the prescription pain medication may constipate you. Incisions: You may remove your dressing bandages 48 hours after surgery if not removed in the hospital.  You will either have some small staples or special tissue glue at each of the incision sites. Once the bandages are removed (if present), the incisions may stay open to air.  You may start showering (but not soaking or bathing in water) the 2nd day after surgery and the incisions simply need to be patted dry after the shower.  No additional care is needed. What to call us about: You should call the office ([phone]) if you develop fever > 101 or develop persistent vomiting.  

## 2023-09-25 NOTE — Anesthesia Postprocedure Evaluation (Signed)
Anesthesia Post Note  Patient: Barbara [name]  Procedure(s) Performed: XI ROBOTIC ASSISTED LAPAROSCOPIC EXTENSIVE LYSIS OF ADHESIONS, PERITONEAL BIOPSIES CYSTOSCOPY WITH ICG, LEFT RETROGRADE PYELOGRAM, LEFT URETERAL STENT EXCHANGE     Patient location during evaluation: PACU Anesthesia Type: General Level of consciousness: awake and alert Pain management: pain level controlled Vital Signs Assessment: post-procedure vital signs reviewed and stable Respiratory status: spontaneous breathing, nonlabored ventilation, respiratory function stable and patient connected to nasal cannula oxygen Cardiovascular status: blood pressure returned to baseline and stable Postop Assessment: no apparent nausea or vomiting Anesthetic complications: no   No notable events documented.  Last Vitals:  Vitals:   09/25/23 1200 09/25/23 1237  BP: 138/76 (!) 156/88  Pulse: 73 94  Resp: 10 18  Temp: 36.4 C 36.7 C  SpO2: 100% 100%    Last Pain:  Vitals:   09/25/23 1300  TempSrc:   PainSc: 6                  Gustav Knueppel P Conchetta Lamia

## 2023-09-25 NOTE — Brief Op Note (Signed)
09/24/2023 - 09/25/2023  10:44 AM  PATIENT:  Barbara [name]  62 y.o. female  PRE-OPERATIVE DIAGNOSIS:  BLADDER CANCER  POST-OPERATIVE DIAGNOSIS:  BLADDER CANCER  PROCEDURE:  Procedure(s): XI ROBOTIC ASSISTED LAPAROSCOPIC EXTENSIVE LYSIS OF ADHESIONS, PERITONEAL BIOPSIES (N/A) CYSTOSCOPY WITH ICG, LEFT RETROGRADE PYELOGRAM, LEFT URETERAL STENT EXCHANGE (N/A)  SURGEON:  Surgeons and Role:    * Xayla Puzio, Delbert Phenix., MD - Primary  PHYSICIAN ASSISTANT:   ASSISTANTS: Harrie Foreman PA   ANESTHESIA:   local and general  EBL:  50 mL   BLOOD ADMINISTERED:none  DRAINS:  1 - JP to bulb; 2 - foley to gravity    LOCAL MEDICATIONS USED:  MARCAINE     SPECIMEN:  Source of Specimen:  peritoneal surfaces  DISPOSITION OF SPECIMEN:  PATHOLOGY  COUNTS:  YES  TOURNIQUET:  * No tourniquets in log *  DICTATION: .Other Dictation: Dictation Number [id number]  PLAN OF CARE: Admit to inpatient   PATIENT DISPOSITION:  PACU - hemodynamically stable.   Delay start of Pharmacological VTE agent (>24hrs) due to surgical blood loss or risk of bleeding: yes

## 2023-09-25 NOTE — Progress Notes (Signed)
S:  1 - Locally Advanced / Metastatic Bladder Cancer - s/p robotic adhesiolysis + peritoneal biopsies 12/4 for advanced bladder caner with some peritoneal carcinomatosis and small + large bowel serosal involvement.   This afternoon "Barbara [name]" is stable. NO n/v, some bd soreness that is managable. Keeping liquids down.  O: NAD with friends in room AOx3, veyr pleasant Non-labored breathing on RA RRR REcnet port sites c/d/I. LLQ Jp scant non-foul serosanguinous output.  Foley with non-foul yellow urine SCD's in place  A/P:  Reinforced incurable nature of disease and overviewed management strategies. She will likely want to pursue radiation with goal of max local control / slow progression as she does have good QOL at present.   Goals for DC discussed. Likely tomorrow afternoon v. Following day.

## 2023-09-25 NOTE — Plan of Care (Signed)

## 2023-09-25 NOTE — Anesthesia Procedure Notes (Signed)
Procedure Name: Intubation Date/Time: 09/25/2023 8:38 AM  Performed by: Florene Route, CRNAPre-anesthesia Checklist: Patient identified, Emergency Drugs available, Suction available and Patient being monitored Patient Re-evaluated:Patient Re-evaluated prior to induction Oxygen Delivery Method: Circle system utilized Preoxygenation: Pre-oxygenation with 100% oxygen Induction Type: IV induction Ventilation: Mask ventilation without difficulty and Oral airway inserted - appropriate to patient size Laryngoscope Size: Hyacinth Meeker and 2 Grade View: Grade I Tube type: Oral Tube size: 7.0 mm Number of attempts: 1 Airway Equipment and Method: Stylet Placement Confirmation: ETT inserted through vocal cords under direct vision, positive ETCO2 and breath sounds checked- equal and bilateral Secured at: 20 cm Tube secured with: Tape Dental Injury: Teeth and Oropharynx as per pre-operative assessment

## 2023-09-26 ENCOUNTER — Encounter (HOSPITAL_COMMUNITY): Payer: Self-pay | Admitting: Urology

## 2023-09-26 LAB — BASIC METABOLIC PANEL
Anion gap: 9 (ref 5–15)
BUN: 17 mg/dL (ref 8–23)
CO2: 22 mmol/L (ref 22–32)
Calcium: 8.6 mg/dL — ABNORMAL LOW (ref 8.9–10.3)
Chloride: 107 mmol/L (ref 98–111)
Creatinine, Ser: 0.96 mg/dL (ref 0.44–1.00)
GFR, Estimated: >60 mL/min (ref >60)
Glucose, Bld: 107 mg/dL — ABNORMAL HIGH (ref 70–99)
Potassium: 3.5 mmol/L (ref 3.5–5.1)
Sodium: 138 mmol/L (ref 135–145)

## 2023-09-26 LAB — HEMOGLOBIN AND HEMATOCRIT, BLOOD
HCT: 29.4 % — ABNORMAL LOW (ref 36.0–46.0)
Hemoglobin: 9.9 g/dL — ABNORMAL LOW (ref 12.0–15.0)

## 2023-09-26 MED ORDER — HEPARIN SOD (PORK) LOCK FLUSH 100 UNIT/ML IV SOLN
500.0000 [IU] | INTRAVENOUS | Status: AC | PRN
  Administered 2023-09-26: 500 [IU]

## 2023-09-26 NOTE — Discharge Summary (Signed)
Physician Discharge Summary  Patient ID: Barbara [name] MRN: [id number] DOB/AGE: 1961/06/24 62 y.o.  Admit date: 09/24/2023 Discharge date: 09/26/2023  Admission Diagnoses: Muscle Invasive Bladder Cancer  Discharge Diagnoses: Metastatic Bladder Cancer Principal Problem:   Bladder cancer [facility])   Discharged Condition: good  Hospital Course:   1 - Locally Advanced / Metastatic Bladder Cancer - s/p robotic adhesiolysis + peritoneal biopsies 12/4 for advanced bladder caner with some peritoneal carcinomatosis and small + large bowel serosal involvement. Original plan was cystectomy and urinary diversion but this was aborted based on intra-op findings.  Admitted 12/3 for stomal marking and bowel prep. By the afternoon of POD 1, the day of discharge, she is ambulatory, pain controlled on PO meds, maintaining nutrition/hydration on regular diet, Hgb 9.9, and felt to be adequate for discharge.   Consults:  Ostomy RN  Significant Diagnostic Studies: labs: as per above  Treatments: surgery: as per above  Discharge Exam: Blood pressure 123/68, pulse 67, temperature 98.2 F (36.8 C), temperature source Oral, resp. rate 14, height 5' 5.54" (1.665 m), weight 53.5 kg, SpO2 98%.  NAD, very near baseline.  AOx3,  Non-labored breathing on RA RRR REcnet port sites c/d/I. LLQ Jp scant non-foul serosanguinous output, removed and dry dressing placed.    Disposition: HOME   Allergies as of 09/26/2023       Reactions   Pseudoephedrine Hcl Other (See Comments)   "Jittery"        Medication List     TAKE these medications    carboxymethylcellulose 0.5 % Soln Commonly known as: REFRESH PLUS Place 1 drop into both eyes 3 (three) times daily as needed (dry eyes).   celecoxib 200 MG capsule Commonly known as: CELEBREX TAKE 1 CAPSULE BY MOUTH DAILY   dicyclomine 10 MG capsule Commonly known as: BENTYL Take 1 capsule (10 mg total) by mouth 3 (three) times daily as needed for up to 30  doses for spasms.   docusate sodium 100 MG capsule Commonly known as: COLACE Take 1 capsule (100 mg total) by mouth 2 (two) times daily.   escitalopram 5 MG tablet Commonly known as: LEXAPRO Take 5 mg by mouth daily.   estradiol 1 MG tablet Commonly known as: ESTRACE TAKE 1 TABLET BY MOUTH DAILY   lidocaine-prilocaine cream Commonly known as: EMLA Apply 1 Application topically as needed.   ondansetron 8 MG tablet Commonly known as: ZOFRAN Take 1 tablet (8 mg total) by mouth every 8 (eight) hours as needed for up to 30 doses for nausea or vomiting.   oxybutynin 5 MG tablet Commonly known as: DITROPAN Take 1 tablet (5 mg total) by mouth every 8 (eight) hours as needed for bladder spasms.   oxycodone-acetaminophen 2.5-325 MG tablet Commonly known as: PERCOCET Take 2 tablets by mouth every 4 (four) hours as needed for pain. What changed:  how much to take when to take this   potassium chloride SA 20 MEQ tablet Commonly known as: KLOR-CON M Take 1 tablet (20 mEq total) by mouth daily for 2 days.   prochlorperazine 10 MG tablet Commonly known as: COMPAZINE Take 1 tablet (10 mg total) by mouth every 6 (six) hours as needed for nausea or vomiting.   topiramate 50 MG tablet Commonly known as: TOPAMAX Take 2 tablets (100 mg total) by mouth at bedtime.   trimethoprim 100 MG tablet Commonly known as: TRIMPEX Take 1 tablet (100 mg total) by mouth daily.        Follow-up Information  Loletta Parish., MD Follow up on 10/21/2023.   Specialty: Urology Why: at 9:45 for MD visit Contact information: [address] [place] [address] [phone]                 Signed: Loletta Parish. 09/26/2023, 12:33 PM

## 2023-09-26 NOTE — Plan of Care (Signed)
  Problem: Education: Goal: Knowledge of General Education information will improve Description: Including pain rating scale, medication(s)/side effects and non-pharmacologic comfort measures Outcome: Adequate for Discharge   Problem: Health Behavior/Discharge Planning: Goal: Ability to manage health-related needs will improve Outcome: Adequate for Discharge   Problem: Clinical Measurements: Goal: Ability to maintain clinical measurements within normal limits will improve Outcome: Adequate for Discharge Goal: Will remain free from infection Outcome: Adequate for Discharge Goal: Diagnostic test results will improve Outcome: Adequate for Discharge Goal: Respiratory complications will improve Outcome: Adequate for Discharge Goal: Cardiovascular complication will be avoided Outcome: Adequate for Discharge   Problem: Activity: Goal: Risk for activity intolerance will decrease Outcome: Adequate for Discharge   Problem: Nutrition: Goal: Adequate nutrition will be maintained Outcome: Adequate for Discharge   Problem: Coping: Goal: Level of anxiety will decrease Outcome: Adequate for Discharge   Problem: Elimination: Goal: Will not experience complications related to bowel motility Outcome: Adequate for Discharge Goal: Will not experience complications related to urinary retention Outcome: Adequate for Discharge   Problem: Pain Management: Goal: General experience of comfort will improve Outcome: Adequate for Discharge   Problem: Safety: Goal: Ability to remain free from injury will improve Outcome: Adequate for Discharge   Problem: Skin Integrity: Goal: Risk for impaired skin integrity will decrease Outcome: Adequate for Discharge   Problem: Education: Goal: Knowledge of the prescribed therapeutic regimen will improve Outcome: Adequate for Discharge   Problem: Bowel/Gastric: Goal: Gastrointestinal status for postoperative course will improve Outcome: Adequate for  Discharge   Problem: Clinical Measurements: Goal: Postoperative complications will be avoided or minimized Outcome: Adequate for Discharge   Problem: Respiratory: Goal: Ability to achieve and maintain a regular respiratory rate will improve Outcome: Adequate for Discharge   Problem: Skin Integrity: Goal: Demonstration of wound healing without infection will improve Outcome: Adequate for Discharge   Problem: Urinary Elimination: Goal: Ability to avoid or minimize complications of infection will improve Outcome: Adequate for Discharge Goal: Ability to achieve and maintain urine output will improve Outcome: Adequate for Discharge

## 2023-09-26 NOTE — Progress Notes (Signed)
Transition of Care [facility]) - Inpatient Brief Assessment   Patient Details  Name: Barbara [name] MRN: [id number] Date of Birth: 08-Jan-1961  Transition of Care [facility]) CM/SW Contact:    Larrie Kass, LCSW Phone Number: 09/26/2023, 11:37 AM      Transition of Care Asessment: Insurance and Status: Insurance coverage has been reviewed Patient has primary care physician: Yes Home environment has been reviewed: from home Prior level of function:: independent Prior/Current Home Services: No current home services Social Determinants of Health Reivew: SDOH reviewed no interventions necessary Readmission risk has been reviewed: Yes Transition of care needs: no transition of care needs at this time

## 2023-09-27 LAB — SURGICAL PATHOLOGY

## 2023-10-18 ENCOUNTER — Encounter: Payer: Self-pay | Admitting: Nurse Practitioner

## 2023-10-18 ENCOUNTER — Ambulatory Visit (INDEPENDENT_AMBULATORY_CARE_PROVIDER_SITE_OTHER): Payer: Managed Care, Other (non HMO) | Admitting: Nurse Practitioner

## 2023-10-18 VITALS — BP 120/74 | HR 82 | Temp 97.2°F | Resp 16 | Ht 65.0 in | Wt 119.4 lb

## 2023-10-18 DIAGNOSIS — C679 Malignant neoplasm of bladder, unspecified: Secondary | ICD-10-CM | POA: Diagnosis not present

## 2023-10-18 DIAGNOSIS — Z0001 Encounter for general adult medical examination with abnormal findings: Secondary | ICD-10-CM

## 2023-10-18 DIAGNOSIS — M62838 Other muscle spasm: Secondary | ICD-10-CM

## 2023-10-18 DIAGNOSIS — Z1231 Encounter for screening mammogram for malignant neoplasm of breast: Secondary | ICD-10-CM

## 2023-10-18 MED ORDER — CARISOPRODOL 350 MG PO TABS
350.0000 mg | ORAL_TABLET | Freq: Every evening | ORAL | 0 refills | Status: DC | PRN
Start: 2023-10-18 — End: 2024-04-17

## 2023-10-18 NOTE — Progress Notes (Unsigned)
[facility] [address] [place], [place] [address]  Internal MEDICINE  Office Visit Note  Patient Name: Barbara [name]  [id number]  [id number]  Date of Service: 10/18/2023  Chief Complaint  Patient presents with   Depression   Gastroesophageal Reflux   Annual Exam     Barbara [name] presents for an annual well visit and physical exam.  Well-appearing 62 y.o. female with recently diagnosed bladder cancer with metastasis to 2 other sites. She also has history of carotid artery stenosis, migraines, osteoarthritis and hx of depression.  Routine CRC screening: due now this month, declined.  Routine mammogram: due now, deferred for now  DEXA scan:done in 2022 Pap smear: hysterectomy Labs: has had a lot of labs done recently with surgical procedure earlier this month. Lipid panel was done in July this year New or worsening pain: has been having a lot of pain since her surgery but this has started improving more over the past few days.  Other concerns: bladder cancer with metastasis. Healing well from recent abdominal surgery done on 09/25/23. Was given a prognosis of 6 months without treatment or up to 2 years if she had radiation treatment done. She meets with the surgeon and the oncologist next week.     Current Medication: Outpatient Encounter Medications as of 10/18/2023  Medication Sig Note   carboxymethylcellulose (REFRESH PLUS) 0.5 % SOLN Place 1 drop into both eyes 3 (three) times daily as needed (dry eyes).    carisoprodol (SOMA) 350 MG tablet Take 1 tablet (350 mg total) by mouth at bedtime as needed for muscle spasms.    celecoxib (CELEBREX) 200 MG capsule TAKE 1 CAPSULE BY MOUTH DAILY 09/17/2023: On hold for procedure   dicyclomine (BENTYL) 10 MG capsule Take 1 capsule (10 mg total) by mouth 3 (three) times daily as needed for up to 30 doses for spasms.    docusate sodium (COLACE) 100 MG capsule Take 1 capsule (100 mg total) by mouth 2 (two) times daily.     escitalopram (LEXAPRO) 5 MG tablet Take 5 mg by mouth daily.    estradiol (ESTRACE) 1 MG tablet TAKE 1 TABLET BY MOUTH DAILY    lidocaine-prilocaine (EMLA) cream Apply 1 Application topically as needed.    ondansetron (ZOFRAN) 8 MG tablet Take 1 tablet (8 mg total) by mouth every 8 (eight) hours as needed for up to 30 doses for nausea or vomiting.    oxybutynin (DITROPAN) 5 MG tablet Take 1 tablet (5 mg total) by mouth every 8 (eight) hours as needed for bladder spasms.    oxycodone-acetaminophen (PERCOCET) 2.5-325 MG tablet Take 2 tablets by mouth every 4 (four) hours as needed for pain.    prochlorperazine (COMPAZINE) 10 MG tablet Take 1 tablet (10 mg total) by mouth every 6 (six) hours as needed for nausea or vomiting.    topiramate (TOPAMAX) 50 MG tablet Take 2 tablets (100 mg total) by mouth at bedtime.    trimethoprim (TRIMPEX) 100 MG tablet Take 1 tablet (100 mg total) by mouth daily.    potassium chloride SA (KLOR-CON M) 20 MEQ tablet Take 1 tablet (20 mEq total) by mouth daily for 2 days. (Patient not taking: Reported on 07/01/2023)    Facility-Administered Encounter Medications as of 10/18/2023  Medication   heparin lock flush 100 unit/mL   heparin lock flush 100 unit/mL   heparin lock flush 100 unit/mL   sodium chloride flush (NS) 0.9 % injection 10 mL   sodium chloride flush (NS) 0.9 %  injection 10 mL   sodium chloride flush (NS) 0.9 % injection 10 mL    Surgical History: Past Surgical History:  Procedure Laterality Date   ABDOMINAL HYSTERECTOMY     BLADDER SURGERY     BREAST BIOPSY Left 2014   neg   CYSTOSCOPY W/ URETERAL STENT PLACEMENT Left 04/04/2023   Procedure: CYSTOSCOPY WITH RETROGRADE PYELOGRAM/URETERAL STENT PLACEMENT;  Surgeon: Vanna Scotland, MD;  Location: [facility] ORS;  Service: Urology;  Laterality: Left;   FOOT SURGERY Right    2009   IR IMAGING GUIDED PORT INSERTION  05/03/2023   LAPAROSCOPIC APPENDECTOMY  12/05/2012   NASAL SEPTUM SURGERY     ROBOT ASSISTED  LAPAROSCOPIC COMPLETE CYSTECT ILEAL CONDUIT N/A 09/25/2023   Procedure: XI ROBOTIC ASSISTED LAPAROSCOPIC EXTENSIVE LYSIS OF ADHESIONS, PERITONEAL BIOPSIES;  Surgeon: Loletta Parish., MD;  Location: [facility] ORS;  Service: Urology;  Laterality: N/A;   SISTRUNK PROCEDURE  03/03/2012   Thyroglossal duct cyst excision   TONSILLECTOMY     TRANSURETHRAL RESECTION OF BLADDER TUMOR N/A 04/04/2023   Procedure: TRANSURETHRAL RESECTION OF BLADDER TUMOR (TURBT);  Surgeon: Vanna Scotland, MD;  Location: [facility] ORS;  Service: Urology;  Laterality: N/A;    Medical History: Past Medical History:  Diagnosis Date   Adopted    Allergic rhinitis    Anemia    h/o   Anxiety    Arthritis    B12 deficiency    Bilateral carotid artery stenosis    Bilateral carotid bruits    Bladder tumor    Cancer (HCC)    bladder cancer   Depression    Gallbladder polyp    GERD (gastroesophageal reflux disease)    Heart murmur    Hydronephrosis of left kidney    Migraines    Tobacco abuse    UTI (urinary tract infection)     Family History: Family History  Adopted: Yes    Social History   Socioeconomic History   Marital status: Married    Spouse name: Not on file   Number of children: Not on file   Years of education: Not on file   Highest education level: Not on file  Occupational History   Not on file  Tobacco Use   Smoking status: Former    Current packs/day: 0.25    Average packs/day: 0.3 packs/day for 30.0 years (7.5 ttl pk-yrs)    Types: Cigarettes    Passive exposure: Past   Smokeless tobacco: Never  Vaping Use   Vaping status: Never Used  Substance and Sexual Activity   Alcohol use: Never   Drug use: Never   Sexual activity: Not on file  Other Topics Concern   Not on file  Social History Narrative   Not on file   Social Drivers of Health   Financial Resource Strain: Not on file  Food Insecurity: No Food Insecurity (09/25/2023)   Hunger Vital Sign    Worried About Running Out of  Food in the Last Year: Never true    Ran Out of Food in the Last Year: Never true  Transportation Needs: No Transportation Needs (09/25/2023)   PRAPARE - Administrator, Civil Service (Medical): No    Lack of Transportation (Non-Medical): No  Physical Activity: Not on file  Stress: Not on file  Social Connections: Not on file  Intimate Partner Violence: Not At Risk (09/25/2023)   Humiliation, Afraid, Rape, and Kick questionnaire    Fear of Current or Ex-Partner: No    Emotionally Abused:  No    Physically Abused: No    Sexually Abused: No      Review of Systems  Constitutional:  Negative for chills, fatigue and unexpected weight change.  HENT:  Negative for congestion, rhinorrhea, sneezing and sore throat.   Eyes:  Negative for redness.  Respiratory:  Negative for cough, chest tightness and shortness of breath.   Cardiovascular:  Negative for chest pain and palpitations.  Gastrointestinal:  Negative for abdominal pain, constipation, diarrhea, nausea and vomiting.  Genitourinary:  Negative for dysuria and frequency.       Urinary incontinence  Musculoskeletal:  Positive for arthralgias. Negative for back pain, joint swelling and neck pain.  Skin:  Negative for rash.  Neurological: Negative.  Negative for tremors and numbness.  Hematological:  Negative for adenopathy. Does not bruise/bleed easily.  Psychiatric/Behavioral:  Positive for depression. Negative for behavioral problems (Depression), sleep disturbance and suicidal ideas. The patient is nervous/anxious.     Vital Signs: BP 120/74   Pulse 82   Temp (!) 97.2 F (36.2 C)   Resp 16   Ht 5\' 5"  (1.651 m)   Wt 119 lb 6.4 oz (54.2 kg)   SpO2 98%   BMI 19.87 kg/m    Physical Exam Vitals and nursing note reviewed.  Constitutional:      General: She is not in acute distress.    Appearance: Normal appearance. She is well-developed and normal weight. She is not ill-appearing or diaphoretic.  HENT:     Head:  Normocephalic and atraumatic.     Right Ear: Tympanic membrane, ear canal and external ear normal.     Left Ear: Tympanic membrane, ear canal and external ear normal.     Nose: Nose normal. No congestion or rhinorrhea.     Mouth/Throat:     Mouth: Mucous membranes are moist.     Pharynx: Oropharynx is clear. No oropharyngeal exudate or posterior oropharyngeal erythema.  Eyes:     General: No scleral icterus.       Right eye: No discharge.        Left eye: No discharge.     Extraocular Movements: Extraocular movements intact.     Conjunctiva/sclera: Conjunctivae normal.     Pupils: Pupils are equal, round, and reactive to light.  Neck:     Thyroid: No thyromegaly.     Vascular: No JVD.     Trachea: No tracheal deviation.  Cardiovascular:     Rate and Rhythm: Normal rate and regular rhythm.     Pulses: Normal pulses.     Heart sounds: Normal heart sounds. No murmur heard.    No friction rub. No gallop.  Pulmonary:     Effort: Pulmonary effort is normal. No respiratory distress.     Breath sounds: Normal breath sounds. No stridor. No wheezing or rales.  Chest:     Chest wall: No tenderness.  Abdominal:     General: Bowel sounds are normal. There is no distension.     Palpations: Abdomen is soft. There is no mass.     Tenderness: There is no abdominal tenderness. There is no guarding or rebound.  Musculoskeletal:        General: No tenderness or deformity. Normal range of motion.     Cervical back: Normal range of motion and neck supple.  Lymphadenopathy:     Cervical: No cervical adenopathy.  Skin:    General: Skin is warm and dry.     Capillary Refill: Capillary refill takes less than  2 seconds.     Coloration: Skin is not pale.     Findings: No erythema or rash.  Neurological:     Mental Status: She is alert and oriented to person, place, and time. Mental status is at baseline.     Cranial Nerves: No cranial nerve deficit.     Motor: No abnormal muscle tone.      Coordination: Coordination normal.     Deep Tendon Reflexes: Reflexes are normal and symmetric.  Psychiatric:        Mood and Affect: Mood normal.        Behavior: Behavior normal.        Thought Content: Thought content normal.        Judgment: Judgment normal.        Assessment/Plan: 1. Encounter for routine adult health examination with abnormal findings (Primary) Age-appropriate preventive screenings and vaccinations discussed, annual physical exam completed. Routine labs for health maintenance deferred. Other screenings deferred for now.  PHM updated.    2. Malignant neoplasm of urinary bladder, unspecified site [facility]) Diagnosed with bladder cancer this year, received chemotherapy, then had surgery earlier this year and was found to have 2 sites of metastasis. She is planning to go through radiation treatment next. They were unable to surgically remove the cancerous tissue  3. Muscle spasms of neck Continue carisoprodol prn as prescribed  - carisoprodol (SOMA) 350 MG tablet; Take 1 tablet (350 mg total) by mouth at bedtime as needed for muscle spasms.  Dispense: 90 tablet; Refill: 0    General Counseling: Barbara [name] verbalizes understanding of the findings of todays visit and agrees with plan of treatment. I have discussed any further diagnostic evaluation that may be needed or ordered today. We also reviewed her medications today. she has been encouraged to call the office with any questions or concerns that should arise related to todays visit.    No orders of the defined types were placed in this encounter.   Meds ordered this encounter  Medications   carisoprodol (SOMA) 350 MG tablet    Sig: Take 1 tablet (350 mg total) by mouth at bedtime as needed for muscle spasms.    Dispense:  90 tablet    Refill:  0    Fill new script today    Return in about 6 months (around 04/17/2024) for F/U routine with Lauren PCP. Marland Kitchen   Total time spent:30 Minutes Time spent includes review  of chart, medications, test results, and follow up plan with the patient.   [place] Controlled Substance Database was reviewed by me.  This patient was seen by Sallyanne Kuster, FNP-C in collaboration with Dr. Beverely Risen as a part of collaborative care agreement.  Charlotte Brafford R. Tedd Sias, MSN, FNP-C Internal medicine

## 2023-10-19 ENCOUNTER — Encounter: Payer: Self-pay | Admitting: Nurse Practitioner

## 2023-10-20 ENCOUNTER — Other Ambulatory Visit: Payer: Self-pay

## 2023-10-21 ENCOUNTER — Encounter: Payer: Managed Care, Other (non HMO) | Admitting: Physician Assistant

## 2023-10-21 ENCOUNTER — Inpatient Hospital Stay: Payer: Managed Care, Other (non HMO) | Attending: Internal Medicine

## 2023-10-21 ENCOUNTER — Encounter: Payer: Self-pay | Admitting: *Deleted

## 2023-10-21 ENCOUNTER — Other Ambulatory Visit: Payer: Managed Care, Other (non HMO)

## 2023-10-21 ENCOUNTER — Inpatient Hospital Stay: Payer: Managed Care, Other (non HMO) | Admitting: Internal Medicine

## 2023-10-21 ENCOUNTER — Encounter: Payer: Self-pay | Admitting: Internal Medicine

## 2023-10-21 ENCOUNTER — Ambulatory Visit: Payer: Managed Care, Other (non HMO) | Admitting: Internal Medicine

## 2023-10-21 VITALS — BP 115/66 | HR 75 | Temp 99.0°F | Ht 65.0 in | Wt 120.2 lb

## 2023-10-21 DIAGNOSIS — C679 Malignant neoplasm of bladder, unspecified: Secondary | ICD-10-CM

## 2023-10-21 DIAGNOSIS — Z87891 Personal history of nicotine dependence: Secondary | ICD-10-CM | POA: Insufficient documentation

## 2023-10-21 DIAGNOSIS — Z79899 Other long term (current) drug therapy: Secondary | ICD-10-CM | POA: Diagnosis not present

## 2023-10-21 DIAGNOSIS — C786 Secondary malignant neoplasm of retroperitoneum and peritoneum: Secondary | ICD-10-CM | POA: Diagnosis present

## 2023-10-21 DIAGNOSIS — C689 Malignant neoplasm of urinary organ, unspecified: Secondary | ICD-10-CM | POA: Diagnosis not present

## 2023-10-21 LAB — CBC WITH DIFFERENTIAL/PLATELET
Abs Immature Granulocytes: 0.01 10*3/uL (ref 0.00–0.07)
Basophils Absolute: 0 10*3/uL (ref 0.0–0.1)
Basophils Relative: 1 %
Eosinophils Absolute: 0.4 10*3/uL (ref 0.0–0.5)
Eosinophils Relative: 6 %
HCT: 34.8 % — ABNORMAL LOW (ref 36.0–46.0)
Hemoglobin: 11.3 g/dL — ABNORMAL LOW (ref 12.0–15.0)
Immature Granulocytes: 0 %
Lymphocytes Relative: 34 %
Lymphs Abs: 2.1 10*3/uL (ref 0.7–4.0)
MCH: 31.4 pg (ref 26.0–34.0)
MCHC: 32.5 g/dL (ref 30.0–36.0)
MCV: 96.7 fL (ref 80.0–100.0)
Monocytes Absolute: 0.5 10*3/uL (ref 0.1–1.0)
Monocytes Relative: 9 %
Neutro Abs: 3.3 10*3/uL (ref 1.7–7.7)
Neutrophils Relative %: 50 %
Platelets: 224 10*3/uL (ref 150–400)
RBC: 3.6 MIL/uL — ABNORMAL LOW (ref 3.87–5.11)
RDW: 13.5 % (ref 11.5–15.5)
WBC: 6.4 10*3/uL (ref 4.0–10.5)
nRBC: 0 % (ref 0.0–0.2)

## 2023-10-21 LAB — CMP (CANCER CENTER ONLY)
ALT: 10 U/L (ref 0–44)
AST: 17 U/L (ref 15–41)
Albumin: 3.7 g/dL (ref 3.5–5.0)
Alkaline Phosphatase: 46 U/L (ref 38–126)
Anion gap: 7 (ref 5–15)
BUN: 28 mg/dL — ABNORMAL HIGH (ref 8–23)
CO2: 26 mmol/L (ref 22–32)
Calcium: 8.8 mg/dL — ABNORMAL LOW (ref 8.9–10.3)
Chloride: 105 mmol/L (ref 98–111)
Creatinine: 1.49 mg/dL — ABNORMAL HIGH (ref 0.44–1.00)
GFR, Estimated: 39 mL/min — ABNORMAL LOW (ref >60)
Glucose, Bld: 98 mg/dL (ref 70–99)
Potassium: 4.1 mmol/L (ref 3.5–5.1)
Sodium: 138 mmol/L (ref 135–145)
Total Bilirubin: 0.5 mg/dL (ref 0.0–1.2)
Total Protein: 7 g/dL (ref 6.5–8.1)

## 2023-10-21 MED ORDER — TRAMADOL HCL 50 MG PO TABS: 50.0000 mg | ORAL_TABLET | Freq: Two times a day (BID) | ORAL | 0 refills | Status: DC | PRN

## 2023-10-21 NOTE — Patient Instructions (Signed)
[place] submitted online per MD request.

## 2023-10-21 NOTE — Progress Notes (Unsigned)
Dr. Orlan Leavens [facility], didn't remove bladder, replaced stents and did biopsies.  Appetite 75% normal, taste is off since chemo. No supplements.  Discuss next steps.

## 2023-10-24 ENCOUNTER — Telehealth: Payer: Self-pay | Admitting: *Deleted

## 2023-10-24 ENCOUNTER — Encounter: Payer: Self-pay | Admitting: Internal Medicine

## 2023-10-24 NOTE — Progress Notes (Signed)
 DISCONTINUE ON PATHWAY REGIMEN - Bladder     A cycle is every 21 days:     Gemcitabine       Cisplatin    **Always confirm dose/schedule in your pharmacy ordering system**  PRIOR TREATMENT: [id number]: Gemcitabine  1,000 mg/m2 D1, 8 + Cisplatin  35 mg/m2 D1, 8 (Split-Dose Cisplatin ) q21 Days x 4 Cycles  START OFF PATHWAY REGIMEN - Bladder   [id number]:Enfortumab vedotin  1.25 mg/kg IV D1,8 + Pembrolizumab  200 mg IV D1 q21 Days:   A cycle is every 21 days:     Enfortumab vedotin -ejfv      Pembrolizumab    **Always confirm dose/schedule in your pharmacy ordering system**  Patient Characteristics: Advanced/Metastatic Disease, First Line, Prior Platinum-Based Therapy and Relapse Within 12 Months, Candidate for PD-1/PD-L1 Inhibitor Therapeutic Status: Advanced/Metastatic Disease Line of Therapy: First Line Prior Therapy and Relapse Status: Prior Platinum-Based Therapy and Relapse Within 12 Months PD-1/PD-L1 Inhibitor Candidate Status: Candidate for PD-1/PD-L1 Inhibitor Intent of Therapy: Non-Curative / Palliative Intent, Discussed with Patient

## 2023-10-24 NOTE — Telephone Encounter (Signed)
 Disability form received completed and sent for doctor signature and final completion

## 2023-10-25 ENCOUNTER — Encounter: Payer: Self-pay | Admitting: Internal Medicine

## 2023-10-25 ENCOUNTER — Encounter: Payer: Self-pay | Admitting: Nurse Practitioner

## 2023-10-25 NOTE — Progress Notes (Signed)
 Pharmacist Chemotherapy Monitoring - Initial Assessment    Anticipated start date: 11/05/23   The following has been reviewed per standard work regarding the patient's treatment regimen: The patient's diagnosis, treatment plan and drug doses, and organ/hematologic function Lab orders and baseline tests specific to treatment regimen  The treatment plan start date, drug sequencing, and pre-medications Prior authorization status  Patient's documented medication list, including drug-drug interaction screen and prescriptions for anti-emetics and supportive care specific to the treatment regimen The drug concentrations, fluid compatibility, administration routes, and timing of the medications to be used The patient's access for treatment and lifetime cumulative dose history, if applicable  The patient's medication allergies and previous infusion related reactions, if applicable   Changes made to treatment plan:  N/A  Follow up needed:  Pending authorization for treatment    Maudie FORBES Andreas, PharmD, BCPS Clinical Pharmacist   10/25/2023  10:54 AM

## 2023-10-28 ENCOUNTER — Ambulatory Visit
Admission: RE | Admit: 2023-10-28 | Discharge: 2023-10-28 | Disposition: A | Discharge status: Home / Self Care | Payer: Managed Care, Other (non HMO) | Source: Ambulatory Visit | Attending: Internal Medicine | Admitting: Internal Medicine

## 2023-10-28 DIAGNOSIS — C799 Secondary malignant neoplasm of unspecified site: Secondary | ICD-10-CM | POA: Insufficient documentation

## 2023-10-28 DIAGNOSIS — Z960 Presence of urogenital implants: Secondary | ICD-10-CM | POA: Insufficient documentation

## 2023-10-28 DIAGNOSIS — I700 Atherosclerosis of aorta: Secondary | ICD-10-CM | POA: Diagnosis not present

## 2023-10-28 DIAGNOSIS — C679 Malignant neoplasm of bladder, unspecified: Secondary | ICD-10-CM | POA: Insufficient documentation

## 2023-10-28 LAB — GLUCOSE, CAPILLARY: Glucose-Capillary: 83 mg/dL (ref 70–99)

## 2023-10-28 MED ORDER — FLUDEOXYGLUCOSE F - 18 (FDG) INJECTION
6.2000 | Freq: Once | INTRAVENOUS | Status: AC | PRN
  Administered 2023-10-28: 6.65 via INTRAVENOUS

## 2023-10-29 ENCOUNTER — Telehealth: Payer: Self-pay

## 2023-10-29 ENCOUNTER — Encounter: Payer: Self-pay | Admitting: Internal Medicine

## 2023-10-29 NOTE — Telephone Encounter (Signed)
 Called to ask to ask patient about the [employer] form that she had Dr Clista to fill out, the date that she wanted to try to go back to work. She told me that she was wanting to go back after her surgery, but she was told that her cancer has progressed to stage 4. So now she is going to have to come to start treatment again. So some time tomorrow (10/30/2023), she will be coming to sign the employee portion of the [employer] form, and let me know if there is something else we can do to extend her disability.

## 2023-10-30 ENCOUNTER — Encounter: Payer: Self-pay | Admitting: Nurse Practitioner

## 2023-10-30 ENCOUNTER — Telehealth: Payer: Self-pay

## 2023-10-30 ENCOUNTER — Encounter: Payer: Self-pay | Admitting: Internal Medicine

## 2023-10-30 NOTE — Telephone Encounter (Signed)
 FMLA completed and faxed copy given to patient in clinic

## 2023-10-30 NOTE — Telephone Encounter (Signed)
 Patient came in today to sign the [employer] disability forms. She signed, and we changed the date for her to be able to go back to work from 11/20/27/2025, to 01/20/2024. I made her a copy of the papers. She told me that she would be in on Tuesday to start her first round of treatment. After she left I took Erminio the completed forms and realized the sealed envelope that was in the folder was supposed to go home with her, so I just put the closed envelope at my desk and I will just give it to her on Tuesday.

## 2023-11-04 ENCOUNTER — Encounter: Payer: Self-pay | Admitting: Internal Medicine

## 2023-11-05 ENCOUNTER — Other Ambulatory Visit: Payer: Managed Care, Other (non HMO)

## 2023-11-05 ENCOUNTER — Inpatient Hospital Stay: Payer: Managed Care, Other (non HMO) | Attending: Internal Medicine

## 2023-11-05 ENCOUNTER — Ambulatory Visit: Payer: Managed Care, Other (non HMO)

## 2023-11-05 ENCOUNTER — Inpatient Hospital Stay: Payer: Managed Care, Other (non HMO)

## 2023-11-05 ENCOUNTER — Encounter: Payer: Self-pay | Admitting: Internal Medicine

## 2023-11-05 ENCOUNTER — Ambulatory Visit: Payer: Managed Care, Other (non HMO) | Admitting: Internal Medicine

## 2023-11-05 ENCOUNTER — Inpatient Hospital Stay: Payer: Managed Care, Other (non HMO) | Admitting: Internal Medicine

## 2023-11-05 VITALS — BP 122/72 | HR 75 | Temp 96.9°F | Resp 16 | Wt 117.0 lb

## 2023-11-05 DIAGNOSIS — Z5112 Encounter for antineoplastic immunotherapy: Secondary | ICD-10-CM | POA: Insufficient documentation

## 2023-11-05 DIAGNOSIS — C679 Malignant neoplasm of bladder, unspecified: Secondary | ICD-10-CM

## 2023-11-05 DIAGNOSIS — Z79620 Long term (current) use of immunosuppressive biologic: Secondary | ICD-10-CM | POA: Insufficient documentation

## 2023-11-05 LAB — CMP (CANCER CENTER ONLY)
ALT: 10 U/L (ref 0–44)
AST: 19 U/L (ref 15–41)
Albumin: 3.6 g/dL (ref 3.5–5.0)
Alkaline Phosphatase: 39 U/L (ref 38–126)
Anion gap: 9 (ref 5–15)
BUN: 25 mg/dL — ABNORMAL HIGH (ref 8–23)
CO2: 22 mmol/L (ref 22–32)
Calcium: 8.7 mg/dL — ABNORMAL LOW (ref 8.9–10.3)
Chloride: 107 mmol/L (ref 98–111)
Creatinine: 1.24 mg/dL — ABNORMAL HIGH (ref 0.44–1.00)
GFR, Estimated: 49 mL/min — ABNORMAL LOW (ref >60)
Glucose, Bld: 103 mg/dL — ABNORMAL HIGH (ref 70–99)
Potassium: 3.4 mmol/L — ABNORMAL LOW (ref 3.5–5.1)
Sodium: 138 mmol/L (ref 135–145)
Total Bilirubin: 0.6 mg/dL (ref 0.0–1.2)
Total Protein: 7.2 g/dL (ref 6.5–8.1)

## 2023-11-05 LAB — CBC WITH DIFFERENTIAL (CANCER CENTER ONLY)
Abs Immature Granulocytes: 0.02 10*3/uL (ref 0.00–0.07)
Basophils Absolute: 0 10*3/uL (ref 0.0–0.1)
Basophils Relative: 1 %
Eosinophils Absolute: 0.3 10*3/uL (ref 0.0–0.5)
Eosinophils Relative: 6 %
HCT: 34.2 % — ABNORMAL LOW (ref 36.0–46.0)
Hemoglobin: 11 g/dL — ABNORMAL LOW (ref 12.0–15.0)
Immature Granulocytes: 0 %
Lymphocytes Relative: 44 %
Lymphs Abs: 2.5 10*3/uL (ref 0.7–4.0)
MCH: 30.3 pg (ref 26.0–34.0)
MCHC: 32.2 g/dL (ref 30.0–36.0)
MCV: 94.2 fL (ref 80.0–100.0)
Monocytes Absolute: 0.5 10*3/uL (ref 0.1–1.0)
Monocytes Relative: 10 %
Neutro Abs: 2.2 10*3/uL (ref 1.7–7.7)
Neutrophils Relative %: 39 %
Platelet Count: 253 10*3/uL (ref 150–400)
RBC: 3.63 MIL/uL — ABNORMAL LOW (ref 3.87–5.11)
RDW: 13.3 % (ref 11.5–15.5)
WBC Count: 5.6 10*3/uL (ref 4.0–10.5)
nRBC: 0 % (ref 0.0–0.2)

## 2023-11-05 LAB — TSH: TSH: 3.667 u[IU]/mL (ref 0.350–4.500)

## 2023-11-05 MED ORDER — SODIUM CHLORIDE 0.9 % IV SOLN
1.2500 mg/kg | Freq: Once | INTRAVENOUS | Status: AC
  Administered 2023-11-05: 70 mg via INTRAVENOUS
  Filled 2023-11-05: qty 3

## 2023-11-05 MED ORDER — HEPARIN SOD (PORK) LOCK FLUSH 100 UNIT/ML IV SOLN
500.0000 [IU] | Freq: Once | INTRAVENOUS | Status: AC | PRN
  Administered 2023-11-05: 500 [IU]
  Filled 2023-11-05: qty 5

## 2023-11-05 MED ORDER — SODIUM CHLORIDE 0.9 % IV SOLN
200.0000 mg | Freq: Once | INTRAVENOUS | Status: AC
  Administered 2023-11-05: 200 mg via INTRAVENOUS
  Filled 2023-11-05: qty 200

## 2023-11-05 MED ORDER — PROCHLORPERAZINE MALEATE 10 MG PO TABS
10.0000 mg | ORAL_TABLET | Freq: Once | ORAL | Status: AC
  Administered 2023-11-05: 10 mg via ORAL
  Filled 2023-11-05: qty 1

## 2023-11-05 MED ORDER — SODIUM CHLORIDE 0.9% FLUSH
10.0000 mL | INTRAVENOUS | Status: DC | PRN
  Administered 2023-11-05: 10 mL
  Filled 2023-11-05: qty 10

## 2023-11-05 MED ORDER — CARBOXYMETHYLCELL-GLYCERIN PF 0.5-0.9 % OP SOLN: 2.0000 [drp] | Freq: Four times a day (QID) | OPHTHALMIC | 11 refills | Status: DC

## 2023-11-05 MED ORDER — SODIUM CHLORIDE 0.9 % IV SOLN
INTRAVENOUS | Status: DC
  Filled 2023-11-05: qty 250

## 2023-11-05 NOTE — Progress Notes (Signed)
 Patient had a Pet scan on 10/28/2023. She is nervous for her first time coming back in for treatment. She is still hurting when she urinates, and has a little bit of blood in her urine.

## 2023-11-05 NOTE — Progress Notes (Signed)
 [facility] CONSULT NOTE  Patient Care Team: McDonough, Tinnie [name] as PCP - General (Physician Assistant) Clista Bimler, MD as Consulting Physician (Oncology)  REASON FOR REFFERAL: Muscle invasive bladder cancer  CANCER STAGING   Cancer Staging  Urothelial cancer [facility]) Staging form: Kidney, AJCC 8th Edition - Clinical: Stage III (cT3, cN0, cM0) - Signed by Aydyn Testerman, MD on 05/07/2023 Stage prefix: Initial diagnosis - Pathologic stage from 09/25/2023: Stage IV (pM1) - Signed by Clista Bimler, MD on 10/25/2023   ASSESSMENT & PLAN:  Barbara [name] 63 y.o. female with pmh of anxiety, GERD, right carotid artery stenosis was referred to medical oncology for new diagnosis of muscle invasive urothelial cancer.  # Metastatic urothelial carcinoma - Patient developed urinary symptoms around September or October 2023.  Was treated with multiple rounds of antibiotics with no improvement.  She was then referred to Dr. Penne for further evaluation.  - s/p TURBT with left ureteral stent placement by Dr. Penne.  IntraOp findings showed large irregular mucosal submucosal extensive tumor from the left dome, left lateral bladder wall majority of trigone including the left UO and bladder neck.  Measures at least 5 x 5 cm. Pathology showed invasive high-grade urothelial carcinoma. Invades muscularis propria.  -Completed neoadjuvant cycle 4-day 1 of split dose cisplatin  and gemcitabine  (07/23/2023). Pt declined C4D8 due to poor tolerance/nausea/flank pain.  Split dose cisplatin  considered for borderline eGFR/CrCl (36-48).  - s/p robotic adhesiolysis + peritoneal biopsies 09/25/23 showed urothelial cancer with some peritoneal carcinomatosis and small + large bowel serosal involvement. Original plan was cystectomy and urinary diversion but this was aborted based on intra-op findings.   -PET scan from 10/28/2023 showed hypermetabolic retroperitoneal and pelvic lymph nodes bilaterally  concerning for nodal metastasis.  8 mm aortocaval node SUV 4.  Hypermetabolic pelvic sidewall nodes 1.1 cm.  Left external iliac node 9 mm SUV 4.4 midline hypermetabolic activity posterior to bladder suspicious for tumor along vaginal cuff.  Mildly enlarged right paratracheal subcarinal lymph node with intermediate activity indeterminate for metastatic disease.  Left ureteral stent in place.  -Labs reviewed and acceptable for treatment.  Will proceed with cycle 1 day 1 of enfortumab vedotin  1.25 mg/kg and Keytruda  200 mg every 3-week.  Side effects were rediscussed.  Sent a prescription for artificial teardrops to use 2-4 times a day for dry eye prevention.  Potassium 3.4.  Discussed about increasing intake of potassium rich food.  Follow-up in 1 week for cycle 1 day 8 of Enfortumab.  -Tempus pending on peritoneal biopsies.  # Moderate left hydroureteronephrosis s/p left ureteral stent -Left ureteral stent last replaced on 09/25/2023.  Continue follow-up with Dr. Alvaro.  # Acid reflux/gas/bloating-advised to take OTC simethicone, Tums, Pepcid  as needed.  # Coronary atherosclerosis -Detected as incidental finding on CT done for staging purposes. Rest per primary.  # Mild COPD changes -On CT's chest.  Prior history of smoking.  Asymptomatic.  # Access-port placed on 05/03/2023  Orders Placed This Encounter  Procedures   CBC with Differential (Cancer Center Only)    Standing Status:   Future    Expected Date:   11/26/2023    Expiration Date:   11/25/2024   CMP (Cancer Center only)    Standing Status:   Future    Expected Date:   11/26/2023    Expiration Date:   11/25/2024   CBC with Differential (Cancer Center Only)    Standing Status:   Future    Expected Date:  12/03/2023    Expiration Date:   12/02/2024   CMP (Cancer Center only)    Standing Status:   Future    Expected Date:   12/03/2023    Expiration Date:   12/02/2024   RTC in 1 week for MD visit, labs, cycle 1 day 8 of EV  The total  time spent in the appointment was 30 minutes encounter with patients including review of chart and various tests results, discussions about plan of care and coordination of care plan   All questions were answered. The patient knows to call the clinic with any problems, questions or concerns. No barriers to learning was detected.  Genevia Rous, MD 1/14/202510:17 AM   HISTORY OF PRESENTING ILLNESS:  Barbara [name] 63 y.o. female with pmh of pmh of anxiety, GERD, right carotid artery stenosis was referred to medical oncology for new diagnosis of muscle invasive urothelial cancer.  Patient develops urinary symptoms around September or October 2023.  Was treated with multiple rounds of antibiotics with no improvement.  She was then referred to Dr. Penne for further evaluation.  CTU from 03/22/2023 showed moderate left hydroureteronephrosis to the UVJ, marked leftward asymmetric wall thickening of the urinary bladder measuring 18 mm in maximal thickness which extends over the UVJ.  Perivesical stranding soft tissue.  No evidence of upper tract or abdominopelvic metastatic disease.  s/p TURBT with left ureteral stent placement by Dr. Penne.  IntraOp findings showed large irregular mucosal submucosal extensive tumor from the left dome, left lateral bladder wall majority of trigone including the left UO and bladder neck.  Measures at least 5 x 5 cm.  Pathology showed invasive high-grade urothelial carcinoma.  There is some discrepancy.  Lower down in the report mentions low-grade.  Invades muscularis propria.  Interval history Patient seen today as follow-up accompanied with sister prior to cycle 1 day 1 of treatment. Doing well overall.  Mild left flank pain.  Occasionally blood in urine.  Anxious about her first treatment.  I have reviewed her chart and materials related to her cancer extensively and collaborated history with the patient. Summary of oncologic history is as follows: Oncology  History  Urothelial cancer (HCC)  04/12/2023 Initial Diagnosis   Urothelial cancer (HCC)   04/12/2023 Cancer Staging   Staging form: Kidney, AJCC 8th Edition - Clinical: Stage III (cT3, cN0, cM0) - Signed by Kasem Mozer, MD on 05/07/2023 Stage prefix: Initial diagnosis   09/25/2023 Cancer Staging   Staging form: Kidney, AJCC 8th Edition - Pathologic stage from 09/25/2023: Stage IV (pM1) - Signed by Rous Genevia, MD on 10/25/2023   Malignant neoplasm of urinary bladder (HCC)  04/22/2023 Initial Diagnosis   Malignant neoplasm of urinary bladder (HCC)   05/07/2023 - 07/23/2023 Chemotherapy   Patient is on Treatment Plan : BLADDER Gemcitabine  D1,8 + Cisplatin  (split dose) D1,8 q21d x 4 cycles     11/05/2023 -  Chemotherapy   Patient is on Treatment Plan : UROTHELIAL ADVANCED, METASTATIC ENFORTUMAB D1, D8 + PEMBROLIZUMAB  (200) D1 Q21D       MEDICAL HISTORY:  Past Medical History:  Diagnosis Date   Adopted    Allergic rhinitis    Anemia    h/o   Anxiety    Arthritis    B12 deficiency    Bilateral carotid artery stenosis    Bilateral carotid bruits    Bladder tumor    Cancer (HCC)    bladder cancer   Depression    Gallbladder polyp  GERD (gastroesophageal reflux disease)    Heart murmur    Hydronephrosis of left kidney    Migraines    Tobacco abuse    UTI (urinary tract infection)     SURGICAL HISTORY: Past Surgical History:  Procedure Laterality Date   ABDOMINAL HYSTERECTOMY     BLADDER SURGERY     BREAST BIOPSY Left 2014   neg   CYSTOSCOPY W/ URETERAL STENT PLACEMENT Left 04/04/2023   Procedure: CYSTOSCOPY WITH RETROGRADE PYELOGRAM/URETERAL STENT PLACEMENT;  Surgeon: Penne Knee, MD;  Location: [facility] ORS;  Service: Urology;  Laterality: Left;   FOOT SURGERY Right    2009   IR IMAGING GUIDED PORT INSERTION  05/03/2023   LAPAROSCOPIC APPENDECTOMY  12/05/2012   NASAL SEPTUM SURGERY     ROBOT ASSISTED LAPAROSCOPIC COMPLETE CYSTECT ILEAL CONDUIT N/A 09/25/2023    Procedure: XI ROBOTIC ASSISTED LAPAROSCOPIC EXTENSIVE LYSIS OF ADHESIONS, PERITONEAL BIOPSIES;  Surgeon: Alvaro Ricardo KATHEE Mickey., MD;  Location: [facility];  Service: Urology;  Laterality: N/A;   SISTRUNK PROCEDURE  03/03/2012   Thyroglossal duct cyst excision   TONSILLECTOMY     TRANSURETHRAL RESECTION OF BLADDER TUMOR N/A 04/04/2023   Procedure: TRANSURETHRAL RESECTION OF BLADDER TUMOR (TURBT);  Surgeon: Penne Knee, MD;  Location: [facility] ORS;  Service: Urology;  Laterality: N/A;    SOCIAL HISTORY: Social History   Socioeconomic History   Marital status: Married    Spouse name: Not on file   Number of children: Not on file   Years of education: Not on file   Highest education level: Not on file  Occupational History   Not on file  Tobacco Use   Smoking status: Former    Current packs/day: 0.25    Average packs/day: 0.3 packs/day for 30.0 years (7.5 ttl pk-yrs)    Types: Cigarettes    Passive exposure: Past   Smokeless tobacco: Never  Vaping Use   Vaping status: Never Used  Substance and Sexual Activity   Alcohol  use: Never   Drug use: Never   Sexual activity: Not on file  Other Topics Concern   Not on file  Social History Narrative   Not on file   Social Drivers of Health   Financial Resource Strain: Not on file  Food Insecurity: No Food Insecurity (09/25/2023)   Hunger Vital Sign    Worried About Running Out of Food in the Last Year: Never true    Ran Out of Food in the Last Year: Never true  Transportation Needs: No Transportation Needs (09/25/2023)   PRAPARE - Administrator, Civil Service (Medical): No    Lack of Transportation (Non-Medical): No  Physical Activity: Not on file  Stress: Not on file  Social Connections: Not on file  Intimate Partner Violence: Not At Risk (09/25/2023)   Humiliation, Afraid, Rape, and Kick questionnaire    Fear of Current or Ex-Partner: No    Emotionally Abused: No    Physically Abused: No    Sexually Abused: No     FAMILY HISTORY: Family History  Adopted: Yes    ALLERGIES:  is allergic to pseudoephedrine hcl.  MEDICATIONS:  Current Outpatient Medications  Medication Sig Dispense Refill   Carboxymethylcell-Glycerin  PF 0.5-0.9 % SOLN Place 2 drops into both eyes 4 (four) times daily. 1 each 11   carboxymethylcellulose (REFRESH PLUS) 0.5 % SOLN Place 1 drop into both eyes 3 (three) times daily as needed (dry eyes).     carisoprodol  (SOMA ) 350 MG tablet Take 1 tablet (350 mg  total) by mouth at bedtime as needed for muscle spasms. 90 tablet 0   celecoxib  (CELEBREX ) 200 MG capsule TAKE 1 CAPSULE BY MOUTH DAILY 90 capsule 3   dicyclomine  (BENTYL ) 10 MG capsule Take 1 capsule (10 mg total) by mouth 3 (three) times daily as needed for up to 30 doses for spasms. 30 capsule 0   docusate sodium  (COLACE) 100 MG capsule Take 1 capsule (100 mg total) by mouth 2 (two) times daily.     escitalopram  (LEXAPRO ) 5 MG tablet Take 5 mg by mouth daily.     estradiol  (ESTRACE ) 1 MG tablet TAKE 1 TABLET BY MOUTH DAILY 90 tablet 3   lidocaine -prilocaine  (EMLA ) cream Apply 1 Application topically as needed. 30 g 0   ondansetron  (ZOFRAN ) 8 MG tablet Take 1 tablet (8 mg total) by mouth every 8 (eight) hours as needed for up to 30 doses for nausea or vomiting. 30 tablet 0   oxybutynin  (DITROPAN ) 5 MG tablet Take 1 tablet (5 mg total) by mouth every 8 (eight) hours as needed for bladder spasms. 30 tablet 0   prochlorperazine  (COMPAZINE ) 10 MG tablet Take 1 tablet (10 mg total) by mouth every 6 (six) hours as needed for nausea or vomiting. 30 tablet 0   topiramate  (TOPAMAX ) 50 MG tablet Take 2 tablets (100 mg total) by mouth at bedtime. 180 tablet 3   traMADol  (ULTRAM ) 50 MG tablet Take 1 tablet (50 mg total) by mouth every 12 (twelve) hours as needed. 30 tablet 0   trimethoprim  (TRIMPEX ) 100 MG tablet Take 1 tablet (100 mg total) by mouth daily. 30 tablet 11   No current facility-administered medications for this visit.    Facility-Administered Medications Ordered in Other Visits  Medication Dose Route Frequency Provider Last Rate Last Admin   0.9 %  sodium chloride  infusion   Intravenous Continuous Haiven Nardone, MD       enfortumab vedotin -ejfv (PADCEV ) 70 mg in sodium chloride  0.9 % 50 mL (1.2281 mg/mL) chemo infusion  1.25 mg/kg (Treatment Plan Recorded) Intravenous Once Sherae Santino, MD       heparin  lock flush 100 unit/mL  500 Units Intravenous Once Arya Boxley, MD       heparin  lock flush 100 unit/mL  500 Units Intravenous Once Taneika Choi, MD       heparin  lock flush 100 unit/mL  500 Units Intravenous Once Sriyan Cutting, MD       heparin  lock flush 100 unit/mL  500 Units Intracatheter Once PRN Alfreddie Consalvo, MD       pembrolizumab  (KEYTRUDA ) 200 mg in sodium chloride  0.9 % 50 mL chemo infusion  200 mg Intravenous Once Leyanna Bittman, MD       prochlorperazine  (COMPAZINE ) tablet 10 mg  10 mg Oral Once Bharath Bernstein, MD       sodium chloride  flush (NS) 0.9 % injection 10 mL  10 mL Intravenous Once Kyli Sorter, MD       sodium chloride  flush (NS) 0.9 % injection 10 mL  10 mL Intravenous Once Tjuana Vickrey, MD       sodium chloride  flush (NS) 0.9 % injection 10 mL  10 mL Intravenous Once Zaleigh Bermingham, MD       sodium chloride  flush (NS) 0.9 % injection 10 mL  10 mL Intracatheter PRN Colton Tassin, MD        REVIEW OF SYSTEMS:   Pertinent information mentioned in HPI All other systems were reviewed with the patient and are negative.  PHYSICAL EXAMINATION:  ECOG PERFORMANCE STATUS: 1 - Symptomatic but completely ambulatory  Vitals:   11/05/23 0935  BP: 122/72  Pulse: 75  Resp: 16  Temp: (!) 96.9 F (36.1 C)  SpO2: 99%    Filed Weights   11/05/23 0935  Weight: 117 lb (53.1 kg)     GENERAL:alert, no distress and comfortable SKIN: skin color, texture, turgor are normal, no rashes or significant lesions EYES: normal, conjunctiva are pink and non-injected, sclera  clear OROPHARYNX:no exudate, no erythema and lips, buccal mucosa, and tongue normal  NECK: supple, thyroid  normal size, non-tender, without nodularity LYMPH:  no palpable lymphadenopathy in the cervical, axillary or inguinal LUNGS: clear to auscultation and percussion with normal breathing effort HEART: regular rate & rhythm and no murmurs and no lower extremity edema ABDOMEN:abdomen soft, non-tender and normal bowel sounds Musculoskeletal:no cyanosis of digits and no clubbing  PSYCH: alert & oriented x 3 with fluent speech NEURO: no focal motor/sensory deficits  LABORATORY DATA:  I have reviewed the data as listed Lab Results  Component Value Date   WBC 5.6 11/05/2023   HGB 11.0 (L) 11/05/2023   HCT 34.2 (L) 11/05/2023   MCV 94.2 11/05/2023   PLT 253 11/05/2023   Recent Labs    09/24/23 2024 09/26/23 0333 10/21/23 1307 11/05/23 0926  NA 134* 138 138 138  K 4.1 3.5 4.1 3.4*  CL 102 107 105 107  CO2 24 22 26 22   GLUCOSE 127* 107* 98 103*  BUN 30* 17 28* 25*  CREATININE 1.34* 0.96 1.49* 1.24*  CALCIUM  9.2 8.6* 8.8* 8.7*  GFRNONAA 45* >60 39* 49*  PROT 7.4  --  7.0 7.2  ALBUMIN 3.9  --  3.7 3.6  AST 19  --  17 19  ALT 12  --  10 10  ALKPHOS 47  --  46 39  BILITOT 0.4  --  0.5 0.6    RADIOGRAPHIC STUDIES: I have personally reviewed the radiological images as listed and agreed with the findings in the report. NM PET Image Restag (PS) Skull Base To Thigh Result Date: 10/28/2023 CLINICAL DATA:  Subsequent treatment strategy for metastatic bladder cancer. EXAM: NUCLEAR MEDICINE PET SKULL BASE TO THIGH TECHNIQUE: 6.65 mCi F-18 FDG was injected intravenously. Full-ring PET imaging was performed from the skull base to thigh after the radiotracer. CT data was obtained and used for attenuation correction and anatomic localization. Fasting blood glucose: 83 mg/dl COMPARISON:  Chest CT 92/98/7975.  Abdominopelvic CT 06/09/2023 FINDINGS: Mediastinal blood pool activity: SUV max 2.3  NECK: No hypermetabolic cervical lymph nodes are identified. No suspicious activity identified within the pharyngeal mucosal space. Incidental CT findings: none CHEST: 8 mm right paratracheal node has mildly enlarged from the previous chest CT and demonstrates intermediate metabolic activity (SUV max 4.9). There is low-level subcarinal nodal activity (SUV max 4.1). No other hypermetabolic thoracic lymph nodes are identified. No hypermetabolic pulmonary activity or suspicious nodularity. Incidental CT findings: Right IJ Port-A-Cath extends to the upper right atrium. Atherosclerosis of the aorta, great vessels and coronary arteries. Calcifications of the aortic valve. ABDOMEN/PELVIS: There is no hypermetabolic activity within the liver, adrenal glands, spleen or pancreas. There are scattered small hypermetabolic retroperitoneal and pelvic lymph nodes bilaterally, suspicious for nodal metastases. 8 mm aortocaval node on image 107/6 has an SUV max of 4.0. Hypermetabolic pelvic sidewall nodes measure up to 1.1 cm short axis on the left on image 132/6 (SUV max 5.1). There is a left external iliac node measuring 9 mm on image  135/6 which has an SUV max of 4.4. In addition, there is midline hypermetabolic activity posterior to the bladder (SUV max 8.3) suspicious for tumor along the vaginal cuff. Incidental CT findings: Low level postsurgical activity anteriorly in the right abdominal wall. Small calcified gallstone. Left ureteral stent remains in place with mild left ureteral dilatation. The bladder is decompressed. Left renal cortical scarring. Aortic and branch vessel atherosclerosis. SKELETON: There is no hypermetabolic activity to suggest osseous metastatic disease. Incidental CT findings: none IMPRESSION: 1. Scattered small hypermetabolic retroperitoneal and pelvic lymph nodes bilaterally, suspicious for nodal metastases. 2. Midline hypermetabolic activity posterior to the bladder suspicious for tumor along the  vaginal cuff. 3. Mildly enlarged right paratracheal and subcarinal lymph nodes with intermediate metabolic activity, indeterminate for metastatic disease. 4. No evidence of osseous or visceral organ metastatic disease. 5. Left ureteral stent remains in place with mild left ureteral dilatation. 6.  Aortic Atherosclerosis (ICD10-I70.0). Electronically Signed   By: Elsie Perone M.D.   On: 10/28/2023 15:23

## 2023-11-06 LAB — T4: T4, Total: 8 ug/dL (ref 4.5–12.0)

## 2023-11-11 ENCOUNTER — Encounter: Payer: Self-pay | Admitting: Internal Medicine

## 2023-11-12 ENCOUNTER — Inpatient Hospital Stay: Payer: Managed Care, Other (non HMO)

## 2023-11-12 ENCOUNTER — Encounter: Payer: Self-pay | Admitting: Internal Medicine

## 2023-11-12 ENCOUNTER — Inpatient Hospital Stay: Payer: Managed Care, Other (non HMO) | Admitting: Internal Medicine

## 2023-11-12 VITALS — BP 119/65 | HR 74 | Temp 98.1°F | Resp 18 | Wt 116.0 lb

## 2023-11-12 VITALS — BP 122/76 | HR 65 | Resp 18

## 2023-11-12 DIAGNOSIS — R21 Rash and other nonspecific skin eruption: Secondary | ICD-10-CM | POA: Insufficient documentation

## 2023-11-12 DIAGNOSIS — Z5112 Encounter for antineoplastic immunotherapy: Secondary | ICD-10-CM

## 2023-11-12 DIAGNOSIS — C679 Malignant neoplasm of bladder, unspecified: Secondary | ICD-10-CM

## 2023-11-12 DIAGNOSIS — C689 Malignant neoplasm of urinary organ, unspecified: Secondary | ICD-10-CM | POA: Diagnosis not present

## 2023-11-12 LAB — CBC WITH DIFFERENTIAL (CANCER CENTER ONLY)
Abs Immature Granulocytes: 0.02 10*3/uL (ref 0.00–0.07)
Basophils Absolute: 0 10*3/uL (ref 0.0–0.1)
Basophils Relative: 1 %
Eosinophils Absolute: 0.6 10*3/uL — ABNORMAL HIGH (ref 0.0–0.5)
Eosinophils Relative: 10 %
HCT: 34.5 % — ABNORMAL LOW (ref 36.0–46.0)
Hemoglobin: 11.5 g/dL — ABNORMAL LOW (ref 12.0–15.0)
Immature Granulocytes: 0 %
Lymphocytes Relative: 30 %
Lymphs Abs: 1.9 10*3/uL (ref 0.7–4.0)
MCH: 30.7 pg (ref 26.0–34.0)
MCHC: 33.3 g/dL (ref 30.0–36.0)
MCV: 92.2 fL (ref 80.0–100.0)
Monocytes Absolute: 0.7 10*3/uL (ref 0.1–1.0)
Monocytes Relative: 11 %
Neutro Abs: 3.2 10*3/uL (ref 1.7–7.7)
Neutrophils Relative %: 48 %
Platelet Count: 249 10*3/uL (ref 150–400)
RBC: 3.74 MIL/uL — ABNORMAL LOW (ref 3.87–5.11)
RDW: 13.2 % (ref 11.5–15.5)
WBC Count: 6.4 10*3/uL (ref 4.0–10.5)
nRBC: 0 % (ref 0.0–0.2)

## 2023-11-12 LAB — CMP (CANCER CENTER ONLY)
ALT: 10 U/L (ref 0–44)
AST: 23 U/L (ref 15–41)
Albumin: 3.6 g/dL (ref 3.5–5.0)
Alkaline Phosphatase: 42 U/L (ref 38–126)
Anion gap: 9 (ref 5–15)
BUN: 25 mg/dL — ABNORMAL HIGH (ref 8–23)
CO2: 23 mmol/L (ref 22–32)
Calcium: 8.8 mg/dL — ABNORMAL LOW (ref 8.9–10.3)
Chloride: 103 mmol/L (ref 98–111)
Creatinine: 1.3 mg/dL — ABNORMAL HIGH (ref 0.44–1.00)
GFR, Estimated: 46 mL/min — ABNORMAL LOW (ref >60)
Glucose, Bld: 90 mg/dL (ref 70–99)
Potassium: 3.4 mmol/L — ABNORMAL LOW (ref 3.5–5.1)
Sodium: 135 mmol/L (ref 135–145)
Total Bilirubin: 0.6 mg/dL (ref 0.0–1.2)
Total Protein: 7.4 g/dL (ref 6.5–8.1)

## 2023-11-12 MED ORDER — HEPARIN SOD (PORK) LOCK FLUSH 100 UNIT/ML IV SOLN
500.0000 [IU] | Freq: Once | INTRAVENOUS | Status: AC | PRN
  Administered 2023-11-12: 500 [IU]
  Filled 2023-11-12: qty 5

## 2023-11-12 MED ORDER — SODIUM CHLORIDE 0.9 % IV SOLN
INTRAVENOUS | Status: DC
  Filled 2023-11-12: qty 250

## 2023-11-12 MED ORDER — SODIUM CHLORIDE 0.9 % IV SOLN
1.2500 mg/kg | Freq: Once | INTRAVENOUS | Status: AC
  Administered 2023-11-12: 70 mg via INTRAVENOUS
  Filled 2023-11-12: qty 3

## 2023-11-12 MED ORDER — TRIAMCINOLONE ACETONIDE 0.5 % EX OINT: 1.0000 | TOPICAL_OINTMENT | Freq: Two times a day (BID) | CUTANEOUS | 0 refills | Status: AC

## 2023-11-12 MED ORDER — DIPHENHYDRAMINE HCL 25 MG PO CAPS
50.0000 mg | ORAL_CAPSULE | Freq: Once | ORAL | Status: AC
  Administered 2023-11-12: 50 mg via ORAL

## 2023-11-12 MED ORDER — PROCHLORPERAZINE MALEATE 10 MG PO TABS
10.0000 mg | ORAL_TABLET | Freq: Once | ORAL | Status: AC
  Administered 2023-11-12: 10 mg via ORAL
  Filled 2023-11-12: qty 1

## 2023-11-12 NOTE — Progress Notes (Signed)
Pet scan on 10/28/2023. Patient started having a bad rash all over her body starting 3 days ago, she used Cerva lotion but doesn't really help. Her taste is gone again, and her appetite has decreased. She has heavier blood in her urine.

## 2023-11-12 NOTE — Progress Notes (Signed)
[facility] CONSULT NOTE  Patient Care Team: McDonough, Renard Matter as PCP - General (Physician Assistant) Michaelyn Barter, MD as Consulting Physician (Oncology)  REASON FOR REFFERAL: Muscle invasive bladder cancer  CANCER STAGING   Cancer Staging  Urothelial cancer [facility]) Staging form: Kidney, AJCC 8th Edition - Clinical: Stage III (cT3, cN0, cM0) - Signed by Michaelyn Barter, MD on 05/07/2023 Stage prefix: Initial diagnosis - Pathologic stage from 09/25/2023: Stage IV (pM1) - Signed by Michaelyn Barter, MD on 10/25/2023   ASSESSMENT & PLAN:  Barbara [name] 63 y.o. female with pmh of anxiety, GERD, right carotid artery stenosis was referred to medical oncology for new diagnosis of muscle invasive urothelial cancer.  # Metastatic urothelial carcinoma - Patient developed urinary symptoms around September or October 2023.  Was treated with multiple rounds of antibiotics with no improvement.  She was then referred to Dr. Apolinar Junes for further evaluation.  - s/p TURBT with left ureteral stent placement by Dr. Apolinar Junes.  IntraOp findings showed large irregular mucosal submucosal extensive tumor from the left dome, left lateral bladder wall majority of trigone including the left UO and bladder neck.  Measures at least 5 x 5 cm. Pathology showed invasive high-grade urothelial carcinoma. Invades muscularis propria.  - Completed neoadjuvant cycle 4-day 1 of split dose cisplatin and gemcitabine (07/23/2023). Pt declined C4D8 due to poor tolerance/nausea/flank pain.  Split dose cisplatin considered for borderline eGFR/CrCl (36-48).  - s/p robotic adhesiolysis + peritoneal biopsies 09/25/23 showed urothelial cancer with some peritoneal carcinomatosis and small + large bowel serosal involvement. Original plan was cystectomy and urinary diversion but this was aborted based on intra-op findings.   -PET scan from 10/28/2023 showed hypermetabolic retroperitoneal and pelvic lymph nodes bilaterally  concerning for nodal metastasis.  8 mm aortocaval node SUV 4.  Hypermetabolic pelvic sidewall nodes 1.1 cm.  Left external iliac node 9 mm SUV 4.4 midline hypermetabolic activity posterior to bladder suspicious for tumor along vaginal cuff.  Mildly enlarged right paratracheal subcarinal lymph node with intermediate activity indeterminate for metastatic disease.  Left ureteral stent in place.  -Labs reviewed and acceptable for treatment.  Started on cycle 1 day 1 of enfortumab vedotin 1.25 mg/kg and Keytruda 200 mg every 3-week on 11/05/2023.  Will proceed with day 8 of Enfortumab today.  Overall tolerating well.  -Tempus pending on peritoneal biopsies.  # Maculopapular rash -Could be related to EV or Keytruda.  Started 2 days ago. -Advised to use Benadryl 50 mg every 6 hours as needed.  Sent prescription for Kenalog cream twice daily on affected area.  I will hold off on systemic steroid since it can decrease the efficacy of immunotherapy.  # Hematuria -Chronic likely due to bladder mass. -Hemoglobin remained stable at 11.5.  # Moderate left hydroureteronephrosis s/p left ureteral stent -Left ureteral stent last replaced on 09/25/2023.  Continue follow-up with Dr. Berneice Heinrich.  # Acid reflux/gas/bloating-advised to take OTC simethicone, Tums, Pepcid as needed.  # Coronary atherosclerosis -Detected as incidental finding on CT done for staging purposes. Rest per primary.  # Mild COPD changes -On CT's chest.  Prior history of smoking.  Asymptomatic.  # Access-port placed on 05/03/2023  No orders of the defined types were placed in this encounter.  RTC in 2 weeks for MD visit, labs, EV, Keytruda  The total time spent in the appointment was 30 minutes encounter with patients including review of chart and various tests results, discussions about plan of care and coordination of care plan  All questions were answered. The patient knows to call the clinic with any problems, questions or concerns. No  barriers to learning was detected.  Michaelyn Barter, MD 1/21/202511:41 AM   HISTORY OF PRESENTING ILLNESS:  Barbara [name] 63 y.o. female with pmh of pmh of anxiety, GERD, right carotid artery stenosis was referred to medical oncology for new diagnosis of muscle invasive urothelial cancer.  Patient develops urinary symptoms around September or October 2023.  Was treated with multiple rounds of antibiotics with no improvement.  She was then referred to Dr. Apolinar Junes for further evaluation.  CTU from 03/22/2023 showed moderate left hydroureteronephrosis to the UVJ, marked leftward asymmetric wall thickening of the urinary bladder measuring 18 mm in maximal thickness which extends over the UVJ.  Perivesical stranding soft tissue.  No evidence of upper tract or abdominopelvic metastatic disease.  s/p TURBT with left ureteral stent placement by Dr. Apolinar Junes.  IntraOp findings showed large irregular mucosal submucosal extensive tumor from the left dome, left lateral bladder wall majority of trigone including the left UO and bladder neck.  Measures at least 5 x 5 cm.  Pathology showed invasive high-grade urothelial carcinoma.  There is some discrepancy.  Lower down in the report mentions low-grade.  Invades muscularis propria.  Interval history Patient seen today as follow-up accompanied with sister prior to cycle 1 day 8 of treatment. Patient doing well overall.  Does report breaking out into rash about 2 days ago on the chest, arms, inner part of the thigh.  Itching present.  She has not taken any medications for it.  Taste buds are gone.  Continues to have hematuria the quantity can fluctuate.  There are days when it is heavy there are days when it is light.  I have reviewed her chart and materials related to her cancer extensively and collaborated history with the patient. Summary of oncologic history is as follows: Oncology History  Urothelial cancer (HCC)  04/12/2023 Initial Diagnosis   Urothelial  cancer (HCC)   04/12/2023 Cancer Staging   Staging form: Kidney, AJCC 8th Edition - Clinical: Stage III (cT3, cN0, cM0) - Signed by Michaelyn Barter, MD on 05/07/2023 Stage prefix: Initial diagnosis   09/25/2023 Cancer Staging   Staging form: Kidney, AJCC 8th Edition - Pathologic stage from 09/25/2023: Stage IV (pM1) - Signed by Michaelyn Barter, MD on 10/25/2023   Malignant neoplasm of urinary bladder (HCC)  04/22/2023 Initial Diagnosis   Malignant neoplasm of urinary bladder (HCC)   05/07/2023 - 07/23/2023 Chemotherapy   Patient is on Treatment Plan : BLADDER Gemcitabine D1,8 + Cisplatin (split dose) D1,8 q21d x 4 cycles     11/05/2023 -  Chemotherapy   Patient is on Treatment Plan : UROTHELIAL ADVANCED, METASTATIC ENFORTUMAB D1, D8 + PEMBROLIZUMAB (200) D1 Q21D       MEDICAL HISTORY:  Past Medical History:  Diagnosis Date   Adopted    Allergic rhinitis    Anemia    h/o   Anxiety    Arthritis    B12 deficiency    Bilateral carotid artery stenosis    Bilateral carotid bruits    Bladder tumor    Cancer (HCC)    bladder cancer   Depression    Gallbladder polyp    GERD (gastroesophageal reflux disease)    Heart murmur    Hydronephrosis of left kidney    Migraines    Tobacco abuse    UTI (urinary tract infection)     SURGICAL HISTORY: Past Surgical History:  Procedure Laterality Date   ABDOMINAL HYSTERECTOMY     BLADDER SURGERY     BREAST BIOPSY Left 2014   neg   CYSTOSCOPY W/ URETERAL STENT PLACEMENT Left 04/04/2023   Procedure: CYSTOSCOPY WITH RETROGRADE PYELOGRAM/URETERAL STENT PLACEMENT;  Surgeon: Vanna Scotland, MD;  Location: [facility] ORS;  Service: Urology;  Laterality: Left;   FOOT SURGERY Right    2009   IR IMAGING GUIDED PORT INSERTION  05/03/2023   LAPAROSCOPIC APPENDECTOMY  12/05/2012   NASAL SEPTUM SURGERY     ROBOT ASSISTED LAPAROSCOPIC COMPLETE CYSTECT ILEAL CONDUIT N/A 09/25/2023   Procedure: XI ROBOTIC ASSISTED LAPAROSCOPIC EXTENSIVE LYSIS OF ADHESIONS,  PERITONEAL BIOPSIES;  Surgeon: Loletta Parish., MD;  Location: [facility];  Service: Urology;  Laterality: N/A;   SISTRUNK PROCEDURE  03/03/2012   Thyroglossal duct cyst excision   TONSILLECTOMY     TRANSURETHRAL RESECTION OF BLADDER TUMOR N/A 04/04/2023   Procedure: TRANSURETHRAL RESECTION OF BLADDER TUMOR (TURBT);  Surgeon: Vanna Scotland, MD;  Location: [facility] ORS;  Service: Urology;  Laterality: N/A;    SOCIAL HISTORY: Social History   Socioeconomic History   Marital status: Married    Spouse name: Not on file   Number of children: Not on file   Years of education: Not on file   Highest education level: Not on file  Occupational History   Not on file  Tobacco Use   Smoking status: Former    Current packs/day: 0.25    Average packs/day: 0.3 packs/day for 30.0 years (7.5 ttl pk-yrs)    Types: Cigarettes    Passive exposure: Past   Smokeless tobacco: Never  Vaping Use   Vaping status: Never Used  Substance and Sexual Activity   Alcohol use: Never   Drug use: Never   Sexual activity: Not on file  Other Topics Concern   Not on file  Social History Narrative   Not on file   Social Drivers of Health   Financial Resource Strain: Not on file  Food Insecurity: No Food Insecurity (09/25/2023)   Hunger Vital Sign    Worried About Running Out of Food in the Last Year: Never true    Ran Out of Food in the Last Year: Never true  Transportation Needs: No Transportation Needs (09/25/2023)   PRAPARE - Administrator, Civil Service (Medical): No    Lack of Transportation (Non-Medical): No  Physical Activity: Not on file  Stress: Not on file  Social Connections: Not on file  Intimate Partner Violence: Not At Risk (09/25/2023)   Humiliation, Afraid, Rape, and Kick questionnaire    Fear of Current or Ex-Partner: No    Emotionally Abused: No    Physically Abused: No    Sexually Abused: No    FAMILY HISTORY: Family History  Adopted: Yes    ALLERGIES:  is allergic  to pseudoephedrine hcl.  MEDICATIONS:  Current Outpatient Medications  Medication Sig Dispense Refill   triamcinolone ointment (KENALOG) 0.5 % Apply 1 Application topically 2 (two) times daily for 10 days. On affected areas 30 g 0   Carboxymethylcell-Glycerin PF 0.5-0.9 % SOLN Place 2 drops into both eyes 4 (four) times daily. 1 each 11   carboxymethylcellulose (REFRESH PLUS) 0.5 % SOLN Place 1 drop into both eyes 3 (three) times daily as needed (dry eyes).     carisoprodol (SOMA) 350 MG tablet Take 1 tablet (350 mg total) by mouth at bedtime as needed for muscle spasms. 90 tablet 0   celecoxib (CELEBREX) 200  MG capsule TAKE 1 CAPSULE BY MOUTH DAILY 90 capsule 3   dicyclomine (BENTYL) 10 MG capsule Take 1 capsule (10 mg total) by mouth 3 (three) times daily as needed for up to 30 doses for spasms. 30 capsule 0   docusate sodium (COLACE) 100 MG capsule Take 1 capsule (100 mg total) by mouth 2 (two) times daily.     escitalopram (LEXAPRO) 5 MG tablet Take 5 mg by mouth daily.     estradiol (ESTRACE) 1 MG tablet TAKE 1 TABLET BY MOUTH DAILY 90 tablet 3   lidocaine-prilocaine (EMLA) cream Apply 1 Application topically as needed. 30 g 0   ondansetron (ZOFRAN) 8 MG tablet Take 1 tablet (8 mg total) by mouth every 8 (eight) hours as needed for up to 30 doses for nausea or vomiting. 30 tablet 0   oxybutynin (DITROPAN) 5 MG tablet Take 1 tablet (5 mg total) by mouth every 8 (eight) hours as needed for bladder spasms. 30 tablet 0   prochlorperazine (COMPAZINE) 10 MG tablet Take 1 tablet (10 mg total) by mouth every 6 (six) hours as needed for nausea or vomiting. 30 tablet 0   topiramate (TOPAMAX) 50 MG tablet Take 2 tablets (100 mg total) by mouth at bedtime. 180 tablet 3   traMADol (ULTRAM) 50 MG tablet Take 1 tablet (50 mg total) by mouth every 12 (twelve) hours as needed. 30 tablet 0   trimethoprim (TRIMPEX) 100 MG tablet Take 1 tablet (100 mg total) by mouth daily. 30 tablet 11   No current  facility-administered medications for this visit.   Facility-Administered Medications Ordered in Other Visits  Medication Dose Route Frequency Provider Last Rate Last Admin   0.9 %  sodium chloride infusion   Intravenous Continuous Michaelyn Barter, MD 10 mL/hr at 11/12/23 1015 New Bag at 11/12/23 1015   enfortumab vedotin-ejfv (PADCEV) 70 mg in sodium chloride 0.9 % 50 mL (1.2281 mg/mL) chemo infusion  1.25 mg/kg (Treatment Plan Recorded) Intravenous Once Michaelyn Barter, MD 114 mL/hr at 11/12/23 1121 70 mg at 11/12/23 1121   heparin lock flush 100 unit/mL  500 Units Intravenous Once Michaelyn Barter, MD       heparin lock flush 100 unit/mL  500 Units Intravenous Once Michaelyn Barter, MD       heparin lock flush 100 unit/mL  500 Units Intravenous Once Michaelyn Barter, MD       heparin lock flush 100 unit/mL  500 Units Intracatheter Once PRN Michaelyn Barter, MD       sodium chloride flush (NS) 0.9 % injection 10 mL  10 mL Intravenous Once Michaelyn Barter, MD       sodium chloride flush (NS) 0.9 % injection 10 mL  10 mL Intravenous Once Michaelyn Barter, MD       sodium chloride flush (NS) 0.9 % injection 10 mL  10 mL Intravenous Once Michaelyn Barter, MD        REVIEW OF SYSTEMS:   Pertinent information mentioned in HPI All other systems were reviewed with the patient and are negative.  PHYSICAL EXAMINATION: ECOG PERFORMANCE STATUS: 1 - Symptomatic but completely ambulatory  Vitals:   11/12/23 0921  BP: 119/65  Pulse: 74  Resp: 18  Temp: 98.1 F (36.7 C)  SpO2: 100%    Filed Weights   11/12/23 0921  Weight: 116 lb (52.6 kg)     GENERAL:alert, no distress and comfortable SKIN: skin color, texture, turgor are normal, no rashes or significant lesions EYES: normal, conjunctiva are pink and  non-injected, sclera clear OROPHARYNX:no exudate, no erythema and lips, buccal mucosa, and tongue normal  NECK: supple, thyroid normal size, non-tender, without nodularity LYMPH:  no palpable  lymphadenopathy in the cervical, axillary or inguinal LUNGS: clear to auscultation and percussion with normal breathing effort HEART: regular rate & rhythm and no murmurs and no lower extremity edema ABDOMEN:abdomen soft, non-tender and normal bowel sounds Musculoskeletal:no cyanosis of digits and no clubbing  PSYCH: alert & oriented x 3 with fluent speech NEURO: no focal motor/sensory deficits  LABORATORY DATA:  I have reviewed the data as listed Lab Results  Component Value Date   WBC 6.4 11/12/2023   HGB 11.5 (L) 11/12/2023   HCT 34.5 (L) 11/12/2023   MCV 92.2 11/12/2023   PLT 249 11/12/2023   Recent Labs    10/21/23 1307 11/05/23 0926 11/12/23 0903  NA 138 138 135  K 4.1 3.4* 3.4*  CL 105 107 103  CO2 26 22 23   GLUCOSE 98 103* 90  BUN 28* 25* 25*  CREATININE 1.49* 1.24* 1.30*  CALCIUM 8.8* 8.7* 8.8*  GFRNONAA 39* 49* 46*  PROT 7.0 7.2 7.4  ALBUMIN 3.7 3.6 3.6  AST 17 19 23   ALT 10 10 10   ALKPHOS 46 39 42  BILITOT 0.5 0.6 0.6    RADIOGRAPHIC STUDIES: I have personally reviewed the radiological images as listed and agreed with the findings in the report. NM PET Image Restag (PS) Skull Base To Thigh Result Date: 10/28/2023 CLINICAL DATA:  Subsequent treatment strategy for metastatic bladder cancer. EXAM: NUCLEAR MEDICINE PET SKULL BASE TO THIGH TECHNIQUE: 6.65 mCi F-18 FDG was injected intravenously. Full-ring PET imaging was performed from the skull base to thigh after the radiotracer. CT data was obtained and used for attenuation correction and anatomic localization. Fasting blood glucose: 83 mg/dl COMPARISON:  Chest CT 62/13/0865.  Abdominopelvic CT 06/09/2023 FINDINGS: Mediastinal blood pool activity: SUV max 2.3 NECK: No hypermetabolic cervical lymph nodes are identified. No suspicious activity identified within the pharyngeal mucosal space. Incidental CT findings: none CHEST: 8 mm right paratracheal node has mildly enlarged from the previous chest CT and demonstrates  intermediate metabolic activity (SUV max 4.9). There is low-level subcarinal nodal activity (SUV max 4.1). No other hypermetabolic thoracic lymph nodes are identified. No hypermetabolic pulmonary activity or suspicious nodularity. Incidental CT findings: Right IJ Port-A-Cath extends to the upper right atrium. Atherosclerosis of the aorta, great vessels and coronary arteries. Calcifications of the aortic valve. ABDOMEN/PELVIS: There is no hypermetabolic activity within the liver, adrenal glands, spleen or pancreas. There are scattered small hypermetabolic retroperitoneal and pelvic lymph nodes bilaterally, suspicious for nodal metastases. 8 mm aortocaval node on image 107/6 has an SUV max of 4.0. Hypermetabolic pelvic sidewall nodes measure up to 1.1 cm short axis on the left on image 132/6 (SUV max 5.1). There is a left external iliac node measuring 9 mm on image 135/6 which has an SUV max of 4.4. In addition, there is midline hypermetabolic activity posterior to the bladder (SUV max 8.3) suspicious for tumor along the vaginal cuff. Incidental CT findings: Low level postsurgical activity anteriorly in the right abdominal wall. Small calcified gallstone. Left ureteral stent remains in place with mild left ureteral dilatation. The bladder is decompressed. Left renal cortical scarring. Aortic and branch vessel atherosclerosis. SKELETON: There is no hypermetabolic activity to suggest osseous metastatic disease. Incidental CT findings: none IMPRESSION: 1. Scattered small hypermetabolic retroperitoneal and pelvic lymph nodes bilaterally, suspicious for nodal metastases. 2. Midline hypermetabolic activity posterior to  the bladder suspicious for tumor along the vaginal cuff. 3. Mildly enlarged right paratracheal and subcarinal lymph nodes with intermediate metabolic activity, indeterminate for metastatic disease. 4. No evidence of osseous or visceral organ metastatic disease. 5. Left ureteral stent remains in place with  mild left ureteral dilatation. 6.  Aortic Atherosclerosis (ICD10-I70.0). Electronically Signed   By: Carey Bullocks M.D.   On: 10/28/2023 15:23

## 2023-11-12 NOTE — Patient Instructions (Signed)
[facility] - A DEPT OF [facility]  Discharge Instructions: Thank you for choosing [facility] to provide your oncology and hematology care.  If you have a lab appointment with the Cancer Center, please go directly to the Cancer Center and check in at the registration area.  Wear comfortable clothing and clothing appropriate for easy access to any Portacath or PICC line.   We strive to give you quality time with your provider. You may need to reschedule your appointment if you arrive late (15 or more minutes).  Arriving late affects you and other patients whose appointments are after yours.  Also, if you miss three or more appointments without notifying the office, you may be dismissed from the clinic at the provider's discretion.      For prescription refill requests, have your pharmacy contact our office and allow 72 hours for refills to be completed.    Today you received the following chemotherapy and/or immunotherapy agents Padcev      To help prevent nausea and vomiting after your treatment, we encourage you to take your nausea medication as directed.  BELOW ARE SYMPTOMS THAT SHOULD BE REPORTED IMMEDIATELY: *FEVER GREATER THAN 100.4 F (38 C) OR HIGHER *CHILLS OR SWEATING *NAUSEA AND VOMITING THAT IS NOT CONTROLLED WITH YOUR NAUSEA MEDICATION *UNUSUAL SHORTNESS OF BREATH *UNUSUAL BRUISING OR BLEEDING *URINARY PROBLEMS (pain or burning when urinating, or frequent urination) *BOWEL PROBLEMS (unusual diarrhea, constipation, pain near the anus) TENDERNESS IN MOUTH AND THROAT WITH OR WITHOUT PRESENCE OF ULCERS (sore throat, sores in mouth, or a toothache) UNUSUAL RASH, SWELLING OR PAIN  UNUSUAL VAGINAL DISCHARGE OR ITCHING   Items with * indicate a potential emergency and should be followed up as soon as possible or go to the Emergency Department if any problems should occur.  Please show the CHEMOTHERAPY ALERT CARD or IMMUNOTHERAPY ALERT  CARD at check-in to the Emergency Department and triage nurse.  Should you have questions after your visit or need to cancel or reschedule your appointment, please contact [facility] - A DEPT OF [facility]  [phone] and follow the prompts.  Office hours are 8:00 a.m. to 4:30 p.m. Monday - Friday. Please note that voicemails left after 4:00 p.m. may not be returned until the following business day.  We are closed weekends and major holidays. You have access to a nurse at all times for urgent questions. Please call the main number to the clinic [phone] and follow the prompts.  For any non-urgent questions, you may also contact your provider using MyChart. We now offer e-Visits for anyone 78 and older to request care online for non-urgent symptoms. For details visit [email or web address].   Also download the MyChart app! Go to the app store, search "MyChart", open the app, select [facility], and log in with your MyChart username and password.

## 2023-11-19 NOTE — Op Note (Signed)
Barbara [name], Barbara [name] MEDICAL RECORD NO: [id number] ACCOUNT NO: [id number] DATE OF BIRTH: [birth year] FACILITY: [facility] LOCATION: [id number] PHYSICIAN: Sebastian Ache, MD   Operative Report    DATE OF PROCEDURE: 09/25/2023   PREOPERATIVE DIAGNOSIS:  Muscle invasive bladder cancer.   POSTOPERATIVE DIAGNOSIS:  Stage IV bladder cancer.   PROCEDURE: 1.  Cystoscopy with injection of indocyanine green dye, left retrograde pyelogram interpretation. 2.  Exchange of left ureteral stent. 3.  Robotic-assisted laparoscopic extensive adhesiolysis and peritoneal biopsies.   ESTIMATED BLOOD LOSS:  20 mL   COMPLICATIONS:  None.   SPECIMENS:  Peritoneal surface biopsies for pathology.   FIRST ASSISTANT:  Harrie Foreman, PA   FINDINGS: 1.  Small amount of residual intraluminal likely bladder tumor in the left lateral wall on cystoscopy. 2.  Successful replacement of left ureteral stent, proximal end in renal pelvis, distal end in urinary bladder. 3.  Extensive very dense malignant adhesions to multiple discontiguous loops of the small bowel as well as likely sigmoid colon in the deep pelvis. 4.  Bilateral ovaries visualized. 5.  Biopsy confirmed extensive extravesical disease.   INDICATIONS:  The patient is a very pleasant 63 year old lady with extensive smoking history.  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  She appropriately underwent transurethral  resection, stenting of her malignant hydronephrosis and then adjuvant chemotherapy under the care of our medical oncology colleagues at [facility] in preparation for curative intent cystectomy.  She tolerated her chemo acceptably.  Most recent  imaging without any obvious distant disease, although still some significant enhancement of the bladder as well as some of the prior vaginal cuff area.  Options were discussed including continue with curative path with cystectomy and  pelvic exenteration  and bilateral salpingo-oophorectomy as she has already had hysterectomy and urinary diversion and she wished to proceed.  She has been counseled by multiple providers about options.  She was admitted yesterday for bowel prep, stoma marking, and labs  which were all acceptable. Informed consent was obtained and placed in medical record.    PROCEDURE IN DETAIL:  The patient being verified, procedure being cystoscopy with ICG, robotic cystectomy, conduit diversion, and node dissection was confirmed.  Procedure timeout was performed.  Intravenous antibiotics administered.  General  endotracheal anesthesia was induced.  The patient was placed into a low lithotomy position.  Sterile field was created, prepped and draped the patient's vagina, introitus and proximal thighs using iodine.  She was further fashioned to operating table  using 3-inch tape with foam padding across supraxiphoid chest.  A test of steep Trendelenburg positioning was performed and found to be suitably positioned.  Her chemo port was accessed and well padded.  Arms stuck to the side with gel rolls.  Sequential  compression devices were placed and verified.  An LAVH type drape was placed after prepping and draping the patient's entire abdomen using chlorhexidine gluconate and her vagina, introitus and proximal thighs using iodine.  Cystourethroscopy was  performed using 24-French injection scope set with 0-degree lens.  Inspection of the urinary bladder did reveal some residual, mostly nodular, likely tumor, it is relatively small volume on the left lateral wall of the bladder with some fibrinous debris  in the distal end of the left ureteral stent in situ in expected position.  2 mL of indocyanine green dye was injected across approximately 5 submucosal blebs in the area surrounding the tumor for sentinel lymphangiography.  A new silicone  Foley catheter was placed per  urethra for straight drain, 10 mL of water in the  balloon.  Next, a high-flow, low-pressure pneumoperitoneum was obtained using Veress technique in the supraumbilical midline having passed the aspiration and drop test.  An 8-mm robotic camera port placed  in the location.  Laparoscopic examination of peritoneal cavity immediately revealed significant adhesions in the pelvis.  At this point, a large sheet of omentum was visualized coursing towards the area of the bladder dome and several loops of small bowel.   Otherwise, unremarkable.  Additional ports were placed as follows:  Left paramedian 8 mm robotic port, left far lateral 8 mm robotic port, right paramedian 15 mm assistant port at the previously marked stomal site, right paramedian 8 mm robotic port,  right far lateral 12 mm AirSeal assistant port.  Robot was docked, having passed the electronic checks.  Next, attention was directed at exposure / development of the pelvis and adhesiolysis.  With further careful inspection, the adhesions of the pelvis were noted to be very,  very extensive.  At first, I felt that it was most likely due to her previous hysterectomy and some areas of loose adhesions were carefully taken down, mostly the omentum and several loose adhesions of the small bowel.  Towards the area of the bladder  dome and vaginal cuff, however, the adhesions became incredibly, incredibly dense and the peritoneal surfaces were very obscured and nodular and friable.  This is highly concerning for locally advanced disease with extravesical disease and likely some  direct invasion into small bowel.  This was a quite concerning finding.  Additional adhesiolysis was carefully performed in areas that were amenable to it and most of the adhesions were taken down, erring in planes to avoid any enterotomies,  which did not occur grossly.  Having better exposed the interface between the bladder dome, vaginal cuff, and bowel, several peritoneal biopsies were taken.  This unfortunately did confirm  metastatic carcinoma.  Additional malignant adhesions remained  involving discontiguous loops of small bowel as well as some areas of descending colon.  I considered options very carefully including possible abandoning the procedure today versus proceeding with a very heroic attempt at extirpation, understanding this  would require likely significant bowel resection including possibly even some colon resection and as these loops of bowel were discontiguous, I felt the amount of resection would be quite extensive and possibly create a very high chance of anastomotic  failures or problem from small gut and I felt that path was quite frankly excessively risky and essentially have zero chance of cure.  I called the patient's son, Barbara [name], by phone, discussed with him the intraoperative findings and that I felt that no  surgery would likely be able to render the patient cancer-free and that proceeding with heroic extirpation would likely entail risks much more substantial than previously planned.  I discussed options of aborting a cystectomy.  We are just exchanging her  left ureteral stent, changing goals more towards palliative therapy versus proceeding with radical extirpation, understanding the tremendous risks.  Son and I agreed the most prudent path was to abort the procedure today given all the risks and benefits  and son's knowledge of the patient's wishes.  As such, all areas utilized were very carefully inspected once again.  No enterotomies were identified.  Given the significant amount of adhesiolysis, I placed a closed suction drain at the previous left  lateral most robotic port site into the deep pelvis within the  peritoneal cavity.  The robot was then undocked.  The previous right 12-mm assistant port site and 15-mm assistant port site were closed at the fascia using Carter-Thomason suture passer and figure-of-eight  PDS.  All incision sites were infiltrated with dilute lipolyzed Marcaine and closed  at the level of the skin using subcuticular Monocryl followed by Dermabond.  I felt it would be prudent to exchange the left ureteral stent as she does have some  malignant hydronephrosis and her stent was visualized to be somewhat encrusted previously on cystoscopy today.  Her Foley catheter was once again removed.  Cystourethroscopy was then again performed using a 21-French rigid cystoscope.  The distal end of  the stent was grasped.  It was removed in entirety and set aside for discard.  It was inspected and intact.  There was moderate encrustation of the distal end.  The left ureteral orifice was cannulated with a 6-French end-hole catheter and left  retrograde pyelogram was obtained.    Left retrograde pyelogram demonstrated single left ureter and single system left kidney.  There was some mass effect on the distal ureter consistent with some mild to moderate malignant hydronephrosis or obstruction rather.  There was minimal  hydronephrosis noted.  A 0.038 ZIPwire was advanced to the level of upper pole and a new 8 x 24 Contour type stent was carefully placed using fluoroscopic guidance.  Foley catheter was placed.  The procedure was terminated.  The patient tolerated the  procedure well, no immediate perioperative complications.  The patient was taken to postanesthesia care in stable condition.  I will discuss the intraoperative findings with the patient and further management options including consideration of radiation  versus transition towards more palliative goals.   Please note first assistant Harrie Foreman was crucial for all portions of the procedure. She provided retraction, suctioning, vascular clipping, robotic instrument exchange, and general first assistance.

## 2023-11-26 ENCOUNTER — Inpatient Hospital Stay: Payer: Managed Care, Other (non HMO) | Admitting: Internal Medicine

## 2023-11-26 ENCOUNTER — Encounter: Payer: Self-pay | Admitting: Internal Medicine

## 2023-11-26 ENCOUNTER — Inpatient Hospital Stay: Payer: Managed Care, Other (non HMO) | Attending: Internal Medicine

## 2023-11-26 ENCOUNTER — Inpatient Hospital Stay: Payer: Managed Care, Other (non HMO)

## 2023-11-26 VITALS — BP 114/73 | HR 71 | Temp 98.4°F | Resp 18 | Wt 114.8 lb

## 2023-11-26 DIAGNOSIS — Z5112 Encounter for antineoplastic immunotherapy: Secondary | ICD-10-CM

## 2023-11-26 DIAGNOSIS — Z79620 Long term (current) use of immunosuppressive biologic: Secondary | ICD-10-CM | POA: Insufficient documentation

## 2023-11-26 DIAGNOSIS — R21 Rash and other nonspecific skin eruption: Secondary | ICD-10-CM

## 2023-11-26 DIAGNOSIS — C679 Malignant neoplasm of bladder, unspecified: Secondary | ICD-10-CM

## 2023-11-26 DIAGNOSIS — Z79899 Other long term (current) drug therapy: Secondary | ICD-10-CM | POA: Insufficient documentation

## 2023-11-26 LAB — CBC WITH DIFFERENTIAL (CANCER CENTER ONLY)
Abs Immature Granulocytes: 0.02 10*3/uL (ref 0.00–0.07)
Basophils Absolute: 0.1 10*3/uL (ref 0.0–0.1)
Basophils Relative: 1 %
Eosinophils Absolute: 1.2 10*3/uL — ABNORMAL HIGH (ref 0.0–0.5)
Eosinophils Relative: 16 %
HCT: 32.6 % — ABNORMAL LOW (ref 36.0–46.0)
Hemoglobin: 10.5 g/dL — ABNORMAL LOW (ref 12.0–15.0)
Immature Granulocytes: 0 %
Lymphocytes Relative: 32 %
Lymphs Abs: 2.4 10*3/uL (ref 0.7–4.0)
MCH: 30.3 pg (ref 26.0–34.0)
MCHC: 32.2 g/dL (ref 30.0–36.0)
MCV: 94.2 fL (ref 80.0–100.0)
Monocytes Absolute: 0.7 10*3/uL (ref 0.1–1.0)
Monocytes Relative: 9 %
Neutro Abs: 3 10*3/uL (ref 1.7–7.7)
Neutrophils Relative %: 42 %
Platelet Count: 288 10*3/uL (ref 150–400)
RBC: 3.46 MIL/uL — ABNORMAL LOW (ref 3.87–5.11)
RDW: 14.6 % (ref 11.5–15.5)
WBC Count: 7.4 10*3/uL (ref 4.0–10.5)
nRBC: 0 % (ref 0.0–0.2)

## 2023-11-26 LAB — CMP (CANCER CENTER ONLY)
ALT: 14 U/L (ref 0–44)
AST: 22 U/L (ref 15–41)
Albumin: 3.4 g/dL — ABNORMAL LOW (ref 3.5–5.0)
Alkaline Phosphatase: 39 U/L (ref 38–126)
Anion gap: 8 (ref 5–15)
BUN: 22 mg/dL (ref 8–23)
CO2: 22 mmol/L (ref 22–32)
Calcium: 8.5 mg/dL — ABNORMAL LOW (ref 8.9–10.3)
Chloride: 105 mmol/L (ref 98–111)
Creatinine: 1.25 mg/dL — ABNORMAL HIGH (ref 0.44–1.00)
GFR, Estimated: 49 mL/min — ABNORMAL LOW (ref >60)
Glucose, Bld: 102 mg/dL — ABNORMAL HIGH (ref 70–99)
Potassium: 3.3 mmol/L — ABNORMAL LOW (ref 3.5–5.1)
Sodium: 135 mmol/L (ref 135–145)
Total Bilirubin: 0.5 mg/dL (ref 0.0–1.2)
Total Protein: 7.2 g/dL (ref 6.5–8.1)

## 2023-11-26 MED ORDER — HEPARIN SOD (PORK) LOCK FLUSH 100 UNIT/ML IV SOLN
500.0000 [IU] | Freq: Once | INTRAVENOUS | Status: AC | PRN
  Administered 2023-11-26: 500 [IU]
  Filled 2023-11-26: qty 5

## 2023-11-26 MED ORDER — SODIUM CHLORIDE 0.9 % IV SOLN
200.0000 mg | Freq: Once | INTRAVENOUS | Status: AC
  Administered 2023-11-26: 200 mg via INTRAVENOUS
  Filled 2023-11-26: qty 200

## 2023-11-26 MED ORDER — SODIUM CHLORIDE 0.9 % IV SOLN
INTRAVENOUS | Status: DC
Start: 2023-11-26 — End: 2023-11-26
  Filled 2023-11-26: qty 250

## 2023-11-26 MED ORDER — PROCHLORPERAZINE MALEATE 10 MG PO TABS: 10.0000 mg | ORAL_TABLET | Freq: Once | ORAL | Status: DC

## 2023-11-26 NOTE — Progress Notes (Signed)
[facility] CONSULT NOTE  Patient Care Team: McDonough, Renard Matter as PCP - General (Physician Assistant) Michaelyn Barter, MD as Consulting Physician (Oncology)  REASON FOR REFFERAL: Muscle invasive bladder cancer  CANCER STAGING   Cancer Staging  Urothelial cancer [facility]) Staging form: Kidney, AJCC 8th Edition - Clinical: Stage III (cT3, cN0, cM0) - Signed by Michaelyn Barter, MD on 05/07/2023 Stage prefix: Initial diagnosis - Pathologic stage from 09/25/2023: Stage IV (pM1) - Signed by Michaelyn Barter, MD on 10/25/2023   ASSESSMENT & PLAN:  Barbara [name] 63 y.o. female with pmh of anxiety, GERD, right carotid artery stenosis was referred to medical oncology for new diagnosis of muscle invasive urothelial cancer.  # Metastatic urothelial carcinoma - Patient developed urinary symptoms around September or October 2023.  Was treated with multiple rounds of antibiotics with no improvement.  She was then referred to Dr. Apolinar Junes for further evaluation.  - s/p TURBT with left ureteral stent placement by Dr. Apolinar Junes.  IntraOp findings showed large irregular mucosal submucosal extensive tumor from the left dome, left lateral bladder wall majority of trigone including the left UO and bladder neck.  Measures at least 5 x 5 cm. Pathology showed invasive high-grade urothelial carcinoma. Invades muscularis propria.  - Completed neoadjuvant cycle 4-day 1 of split dose cisplatin and gemcitabine (07/23/2023). Pt declined C4D8 due to poor tolerance/nausea/flank pain.  Split dose cisplatin considered for borderline eGFR/CrCl (36-48).  - s/p robotic adhesiolysis + peritoneal biopsies 09/25/23 showed urothelial cancer with peritoneal carcinomatosis and small + large bowel serosal involvement. Original plan was cystectomy and urinary diversion but this was aborted based on intra-op findings.   -PET scan from 10/28/2023 showed hypermetabolic retroperitoneal and pelvic lymph nodes bilaterally  concerning for nodal metastasis.  8 mm aortocaval node SUV 4.  Hypermetabolic pelvic sidewall nodes 1.1 cm.  Left external iliac node 9 mm SUV 4.4 midline hypermetabolic activity posterior to bladder suspicious for tumor along vaginal cuff.  Mildly enlarged right paratracheal subcarinal lymph node with intermediate activity indeterminate for metastatic disease.  Left ureteral stent in place.  -Labs reviewed and acceptable for treatment.  Patient broke out into diffuse maculopapular rash with pruritus after cycle 1 day 8 of Enfortumab.  Benadryl did not help.  Also had fever of 100.6.  I examined the rash.  Currently it is in the healing phase.  There is some peeling of the skin on her hands.  Advised to use heavy moisturizer.  Use Claritin 10 mg once daily.  If itching still, to let me know and I can consider adding Singulair.  Today we will proceed with Keytruda only.  Hold Enfortumab.  Follow-up in 1 week to reassess.  -Tempus testing was sent on peritoneal biopsies as recommended by NCCN guidelines to assess for any targetable mutations for treatment options.  Requested staff to scan in the report.  # Maculopapular rash -Could be related to EV or Keytruda.  More likely to be related to EV. -As above.  # Hematuria -Chronic likely due to bladder mass.  # Moderate left hydroureteronephrosis s/p left ureteral stent -Left ureteral stent last replaced on 09/25/2023.  Continue follow-up with Dr. Berneice Heinrich.  # Acid reflux/gas/bloating-advised to take OTC simethicone, Tums, Pepcid as needed.  # Coronary atherosclerosis -Detected as incidental finding on CT done for staging purposes. Rest per primary.  # Mild COPD changes -On CT's chest.  Prior history of smoking.  Asymptomatic.  # Access-port placed on 05/03/2023  No orders of the defined  types were placed in this encounter.  RTC in 1 week for MD visit, labs, EV  The total time spent in the appointment was 30 minutes encounter with patients  including review of chart and various tests results, discussions about plan of care and coordination of care plan   All questions were answered. The patient knows to call the clinic with any problems, questions or concerns. No barriers to learning was detected.  Michaelyn Barter, MD 2/4/202510:39 AM   HISTORY OF PRESENTING ILLNESS:  Barbara [name] 63 y.o. female with pmh of pmh of anxiety, GERD, right carotid artery stenosis was referred to medical oncology for new diagnosis of muscle invasive urothelial cancer.  Patient develops urinary symptoms around September or October 2023.  Was treated with multiple rounds of antibiotics with no improvement.  She was then referred to Dr. Apolinar Junes for further evaluation.  CTU from 03/22/2023 showed moderate left hydroureteronephrosis to the UVJ, marked leftward asymmetric wall thickening of the urinary bladder measuring 18 mm in maximal thickness which extends over the UVJ.  Perivesical stranding soft tissue.  No evidence of upper tract or abdominopelvic metastatic disease.  s/p TURBT with left ureteral stent placement by Dr. Apolinar Junes.  IntraOp findings showed large irregular mucosal submucosal extensive tumor from the left dome, left lateral bladder wall majority of trigone including the left UO and bladder neck.  Measures at least 5 x 5 cm.  Pathology showed invasive high-grade urothelial carcinoma.  There is some discrepancy.  Lower down in the report mentions low-grade.  Invades muscularis propria.  Interval history Patient was seen today as follow-up for stage IV bladder cancer, on Enfortumab and Keytruda. After day 8 of Enfortumab, she had diffuse maculopapular rash with intense pruritus.  Benadryl was not helping and also caused a lot of sedation.  Has some peeling of the skin of hands.  Does report palpable tender nodes in the left groin area.  Started about the same time.  She reports pain if she has to hold her urine.  Had temp of 100.6 when the rash  broke out.  Has been afebrile for about a week now.  I have reviewed her chart and materials related to her cancer extensively and collaborated history with the patient. Summary of oncologic history is as follows: Oncology History  Urothelial cancer (HCC)  04/12/2023 Initial Diagnosis   Urothelial cancer (HCC)   04/12/2023 Cancer Staging   Staging form: Kidney, AJCC 8th Edition - Clinical: Stage III (cT3, cN0, cM0) - Signed by Michaelyn Barter, MD on 05/07/2023 Stage prefix: Initial diagnosis   09/25/2023 Cancer Staging   Staging form: Kidney, AJCC 8th Edition - Pathologic stage from 09/25/2023: Stage IV (pM1) - Signed by Michaelyn Barter, MD on 10/25/2023   Malignant neoplasm of urinary bladder (HCC)  04/22/2023 Initial Diagnosis   Malignant neoplasm of urinary bladder (HCC)   05/07/2023 - 07/23/2023 Chemotherapy   Patient is on Treatment Plan : BLADDER Gemcitabine D1,8 + Cisplatin (split dose) D1,8 q21d x 4 cycles     11/05/2023 -  Chemotherapy   Patient is on Treatment Plan : UROTHELIAL ADVANCED, METASTATIC ENFORTUMAB D1, D8 + PEMBROLIZUMAB (200) D1 Q21D       MEDICAL HISTORY:  Past Medical History:  Diagnosis Date   Adopted    Allergic rhinitis    Anemia    h/o   Anxiety    Arthritis    B12 deficiency    Bilateral carotid artery stenosis    Bilateral carotid bruits  Bladder tumor    Cancer (HCC)    bladder cancer   Depression    Gallbladder polyp    GERD (gastroesophageal reflux disease)    Heart murmur    Hydronephrosis of left kidney    Migraines    Tobacco abuse    UTI (urinary tract infection)     SURGICAL HISTORY: Past Surgical History:  Procedure Laterality Date   ABDOMINAL HYSTERECTOMY     BLADDER SURGERY     BREAST BIOPSY Left 2014   neg   CYSTOSCOPY W/ URETERAL STENT PLACEMENT Left 04/04/2023   Procedure: CYSTOSCOPY WITH RETROGRADE PYELOGRAM/URETERAL STENT PLACEMENT;  Surgeon: Vanna Scotland, MD;  Location: [facility] ORS;  Service: Urology;  Laterality: Left;    FOOT SURGERY Right    2009   IR IMAGING GUIDED PORT INSERTION  05/03/2023   LAPAROSCOPIC APPENDECTOMY  12/05/2012   NASAL SEPTUM SURGERY     ROBOT ASSISTED LAPAROSCOPIC COMPLETE CYSTECT ILEAL CONDUIT N/A 09/25/2023   Procedure: XI ROBOTIC ASSISTED LAPAROSCOPIC EXTENSIVE LYSIS OF ADHESIONS, PERITONEAL BIOPSIES;  Surgeon: Loletta Parish., MD;  Location: [facility];  Service: Urology;  Laterality: N/A;   SISTRUNK PROCEDURE  03/03/2012   Thyroglossal duct cyst excision   TONSILLECTOMY     TRANSURETHRAL RESECTION OF BLADDER TUMOR N/A 04/04/2023   Procedure: TRANSURETHRAL RESECTION OF BLADDER TUMOR (TURBT);  Surgeon: Vanna Scotland, MD;  Location: [facility] ORS;  Service: Urology;  Laterality: N/A;    SOCIAL HISTORY: Social History   Socioeconomic History   Marital status: Married    Spouse name: Not on file   Number of children: Not on file   Years of education: Not on file   Highest education level: Not on file  Occupational History   Not on file  Tobacco Use   Smoking status: Former    Current packs/day: 0.25    Average packs/day: 0.3 packs/day for 30.0 years (7.5 ttl pk-yrs)    Types: Cigarettes    Passive exposure: Past   Smokeless tobacco: Never  Vaping Use   Vaping status: Never Used  Substance and Sexual Activity   Alcohol use: Never   Drug use: Never   Sexual activity: Not on file  Other Topics Concern   Not on file  Social History Narrative   Not on file   Social Drivers of Health   Financial Resource Strain: Not on file  Food Insecurity: No Food Insecurity (09/25/2023)   Hunger Vital Sign    Worried About Running Out of Food in the Last Year: Never true    Ran Out of Food in the Last Year: Never true  Transportation Needs: No Transportation Needs (09/25/2023)   PRAPARE - Administrator, Civil Service (Medical): No    Lack of Transportation (Non-Medical): No  Physical Activity: Not on file  Stress: Not on file  Social Connections: Not on file   Intimate Partner Violence: Not At Risk (09/25/2023)   Humiliation, Afraid, Rape, and Kick questionnaire    Fear of Current or Ex-Partner: No    Emotionally Abused: No    Physically Abused: No    Sexually Abused: No    FAMILY HISTORY: Family History  Adopted: Yes    ALLERGIES:  is allergic to pseudoephedrine hcl.  MEDICATIONS:  Current Outpatient Medications  Medication Sig Dispense Refill   Carboxymethylcell-Glycerin PF 0.5-0.9 % SOLN Place 2 drops into both eyes 4 (four) times daily. 1 each 11   carboxymethylcellulose (REFRESH PLUS) 0.5 % SOLN Place 1 drop into both  eyes 3 (three) times daily as needed (dry eyes).     carisoprodol (SOMA) 350 MG tablet Take 1 tablet (350 mg total) by mouth at bedtime as needed for muscle spasms. 90 tablet 0   celecoxib (CELEBREX) 200 MG capsule TAKE 1 CAPSULE BY MOUTH DAILY 90 capsule 3   dicyclomine (BENTYL) 10 MG capsule Take 1 capsule (10 mg total) by mouth 3 (three) times daily as needed for up to 30 doses for spasms. 30 capsule 0   docusate sodium (COLACE) 100 MG capsule Take 1 capsule (100 mg total) by mouth 2 (two) times daily.     escitalopram (LEXAPRO) 5 MG tablet Take 5 mg by mouth daily.     estradiol (ESTRACE) 1 MG tablet TAKE 1 TABLET BY MOUTH DAILY 90 tablet 3   lidocaine-prilocaine (EMLA) cream Apply 1 Application topically as needed. 30 g 0   ondansetron (ZOFRAN) 8 MG tablet Take 1 tablet (8 mg total) by mouth every 8 (eight) hours as needed for up to 30 doses for nausea or vomiting. 30 tablet 0   oxybutynin (DITROPAN) 5 MG tablet Take 1 tablet (5 mg total) by mouth every 8 (eight) hours as needed for bladder spasms. 30 tablet 0   prochlorperazine (COMPAZINE) 10 MG tablet Take 1 tablet (10 mg total) by mouth every 6 (six) hours as needed for nausea or vomiting. 30 tablet 0   topiramate (TOPAMAX) 50 MG tablet Take 2 tablets (100 mg total) by mouth at bedtime. 180 tablet 3   traMADol (ULTRAM) 50 MG tablet Take 1 tablet (50 mg total) by  mouth every 12 (twelve) hours as needed. 30 tablet 0   trimethoprim (TRIMPEX) 100 MG tablet Take 1 tablet (100 mg total) by mouth daily. 30 tablet 11   No current facility-administered medications for this visit.   Facility-Administered Medications Ordered in Other Visits  Medication Dose Route Frequency Provider Last Rate Last Admin   0.9 %  sodium chloride infusion   Intravenous Continuous Michaelyn Barter, MD 10 mL/hr at 11/26/23 1031 New Bag at 11/26/23 1031   heparin lock flush 100 unit/mL  500 Units Intravenous Once Michaelyn Barter, MD       heparin lock flush 100 unit/mL  500 Units Intravenous Once Michaelyn Barter, MD       heparin lock flush 100 unit/mL  500 Units Intravenous Once Michaelyn Barter, MD       heparin lock flush 100 unit/mL  500 Units Intracatheter Once PRN Michaelyn Barter, MD       pembrolizumab [facility]) 200 mg in sodium chloride 0.9 % 50 mL chemo infusion  200 mg Intravenous Once Michaelyn Barter, MD       sodium chloride flush (NS) 0.9 % injection 10 mL  10 mL Intravenous Once Michaelyn Barter, MD       sodium chloride flush (NS) 0.9 % injection 10 mL  10 mL Intravenous Once Michaelyn Barter, MD       sodium chloride flush (NS) 0.9 % injection 10 mL  10 mL Intravenous Once Michaelyn Barter, MD        REVIEW OF SYSTEMS:   Pertinent information mentioned in HPI All other systems were reviewed with the patient and are negative.  PHYSICAL EXAMINATION: ECOG PERFORMANCE STATUS: 1 - Symptomatic but completely ambulatory  Vitals:   11/26/23 0947  BP: 114/73  Pulse: 71  Resp: 18  Temp: 98.4 F (36.9 C)  SpO2: 98%    Filed Weights   11/26/23 0947  Weight: 114 lb  12.8 oz (52.1 kg)     GENERAL:alert, no distress and comfortable SKIN: skin color, texture, turgor are normal, no rashes or significant lesions EYES: normal, conjunctiva are pink and non-injected, sclera clear OROPHARYNX:no exudate, no erythema and lips, buccal mucosa, and tongue normal  NECK: supple,  thyroid normal size, non-tender, without nodularity LYMPH:  no palpable lymphadenopathy in the cervical, axillary or inguinal LUNGS: clear to auscultation and percussion with normal breathing effort HEART: regular rate & rhythm and no murmurs and no lower extremity edema ABDOMEN:abdomen soft, non-tender and normal bowel sounds Musculoskeletal:no cyanosis of digits and no clubbing  PSYCH: alert & oriented x 3 with fluent speech NEURO: no focal motor/sensory deficits  LABORATORY DATA:  I have reviewed the data as listed Lab Results  Component Value Date   WBC 7.4 11/26/2023   HGB 10.5 (L) 11/26/2023   HCT 32.6 (L) 11/26/2023   MCV 94.2 11/26/2023   PLT 288 11/26/2023   Recent Labs    11/05/23 0926 11/12/23 0903 11/26/23 0927  NA 138 135 135  K 3.4* 3.4* 3.3*  CL 107 103 105  CO2 22 23 22   GLUCOSE 103* 90 102*  BUN 25* 25* 22  CREATININE 1.24* 1.30* 1.25*  CALCIUM 8.7* 8.8* 8.5*  GFRNONAA 49* 46* 49*  PROT 7.2 7.4 7.2  ALBUMIN 3.6 3.6 3.4*  AST 19 23 22   ALT 10 10 14   ALKPHOS 39 42 39  BILITOT 0.6 0.6 0.5    RADIOGRAPHIC STUDIES: I have personally reviewed the radiological images as listed and agreed with the findings in the report. NM PET Image Restag (PS) Skull Base To Thigh Result Date: 10/28/2023 CLINICAL DATA:  Subsequent treatment strategy for metastatic bladder cancer. EXAM: NUCLEAR MEDICINE PET SKULL BASE TO THIGH TECHNIQUE: 6.65 mCi F-18 FDG was injected intravenously. Full-ring PET imaging was performed from the skull base to thigh after the radiotracer. CT data was obtained and used for attenuation correction and anatomic localization. Fasting blood glucose: 83 mg/dl COMPARISON:  Chest CT 41/32/4401.  Abdominopelvic CT 06/09/2023 FINDINGS: Mediastinal blood pool activity: SUV max 2.3 NECK: No hypermetabolic cervical lymph nodes are identified. No suspicious activity identified within the pharyngeal mucosal space. Incidental CT findings: none CHEST: 8 mm right  paratracheal node has mildly enlarged from the previous chest CT and demonstrates intermediate metabolic activity (SUV max 4.9). There is low-level subcarinal nodal activity (SUV max 4.1). No other hypermetabolic thoracic lymph nodes are identified. No hypermetabolic pulmonary activity or suspicious nodularity. Incidental CT findings: Right IJ Port-A-Cath extends to the upper right atrium. Atherosclerosis of the aorta, great vessels and coronary arteries. Calcifications of the aortic valve. ABDOMEN/PELVIS: There is no hypermetabolic activity within the liver, adrenal glands, spleen or pancreas. There are scattered small hypermetabolic retroperitoneal and pelvic lymph nodes bilaterally, suspicious for nodal metastases. 8 mm aortocaval node on image 107/6 has an SUV max of 4.0. Hypermetabolic pelvic sidewall nodes measure up to 1.1 cm short axis on the left on image 132/6 (SUV max 5.1). There is a left external iliac node measuring 9 mm on image 135/6 which has an SUV max of 4.4. In addition, there is midline hypermetabolic activity posterior to the bladder (SUV max 8.3) suspicious for tumor along the vaginal cuff. Incidental CT findings: Low level postsurgical activity anteriorly in the right abdominal wall. Small calcified gallstone. Left ureteral stent remains in place with mild left ureteral dilatation. The bladder is decompressed. Left renal cortical scarring. Aortic and branch vessel atherosclerosis. SKELETON: There is no hypermetabolic  activity to suggest osseous metastatic disease. Incidental CT findings: none IMPRESSION: 1. Scattered small hypermetabolic retroperitoneal and pelvic lymph nodes bilaterally, suspicious for nodal metastases. 2. Midline hypermetabolic activity posterior to the bladder suspicious for tumor along the vaginal cuff. 3. Mildly enlarged right paratracheal and subcarinal lymph nodes with intermediate metabolic activity, indeterminate for metastatic disease. 4. No evidence of osseous or  visceral organ metastatic disease. 5. Left ureteral stent remains in place with mild left ureteral dilatation. 6.  Aortic Atherosclerosis (ICD10-I70.0). Electronically Signed   By: Carey Bullocks M.D.   On: 10/28/2023 15:23

## 2023-11-26 NOTE — Progress Notes (Signed)
 Patient had a fever of 100.6 that lasted for about 2 days. Her rash is so bad and nothing is really helping her at all. Dry eyes and her taste  Pain on left side of her pelvic area, there are two swollen looking nodules popping up, they can be painful. She told me that when ever she has been in pain the only medication that would help her is hydrocodone .

## 2023-11-26 NOTE — Patient Instructions (Signed)
 [facility] - A DEPT OF [facility]  Discharge Instructions: Thank you for choosing [facility] to provide your oncology and hematology care.  If you have a lab appointment with the Cancer Center, please go directly to the Cancer Center and check in at the registration area.  Wear comfortable clothing and clothing appropriate for easy access to any Portacath or PICC line.   We strive to give you quality time with your provider. You may need to reschedule your appointment if you arrive late (15 or more minutes).  Arriving late affects you and other patients whose appointments are after yours.  Also, if you miss three or more appointments without notifying the office, you may be dismissed from the clinic at the provider's discretion.      For prescription refill requests, have your pharmacy contact our office and allow 72 hours for refills to be completed.    Today you received the following chemotherapy and/or immunotherapy agents Keytruda        To help prevent nausea and vomiting after your treatment, we encourage you to take your nausea medication as directed.  BELOW ARE SYMPTOMS THAT SHOULD BE REPORTED IMMEDIATELY: *FEVER GREATER THAN 100.4 F (38 C) OR HIGHER *CHILLS OR SWEATING *NAUSEA AND VOMITING THAT IS NOT CONTROLLED WITH YOUR NAUSEA MEDICATION *UNUSUAL SHORTNESS OF BREATH *UNUSUAL BRUISING OR BLEEDING *URINARY PROBLEMS (pain or burning when urinating, or frequent urination) *BOWEL PROBLEMS (unusual diarrhea, constipation, pain near the anus) TENDERNESS IN MOUTH AND THROAT WITH OR WITHOUT PRESENCE OF ULCERS (sore throat, sores in mouth, or a toothache) UNUSUAL RASH, SWELLING OR PAIN  UNUSUAL VAGINAL DISCHARGE OR ITCHING   Items with * indicate a potential emergency and should be followed up as soon as possible or go to the Emergency Department if any problems should occur.  Please show the CHEMOTHERAPY ALERT CARD or IMMUNOTHERAPY  ALERT CARD at check-in to the Emergency Department and triage nurse.  Should you have questions after your visit or need to cancel or reschedule your appointment, please contact [facility] - A DEPT OF [facility]  [phone] and follow the prompts.  Office hours are 8:00 a.m. to 4:30 p.m. Monday - Friday. Please note that voicemails left after 4:00 p.m. may not be returned until the following business day.  We are closed weekends and major holidays. You have access to a nurse at all times for urgent questions. Please call the main number to the clinic [phone] and follow the prompts.  For any non-urgent questions, you may also contact your provider using MyChart. We now offer e-Visits for anyone 3 and older to request care online for non-urgent symptoms. For details visit [email or web address].   Also download the MyChart app! Go to the app store, search MyChart, open the app, select [facility], and log in with your MyChart username and password.

## 2023-11-26 NOTE — Patient Instructions (Signed)
 Please start Claritin 10 mg once daily   If still itching, please let me know and I will send a script for Singulair .   We are going to hold Enfortumab today to allow rash to improve. Proceed with Keytruda  only.   We will follow up next week about rash. If continues to heal, will resume Enfortumab.   Use heavy moisturizer - Aveeno or CeraVe.

## 2023-11-27 ENCOUNTER — Encounter: Payer: Self-pay | Admitting: Internal Medicine

## 2023-11-28 ENCOUNTER — Encounter: Payer: Self-pay | Admitting: Internal Medicine

## 2023-12-02 ENCOUNTER — Encounter: Payer: Self-pay | Admitting: Internal Medicine

## 2023-12-03 ENCOUNTER — Inpatient Hospital Stay: Payer: Managed Care, Other (non HMO) | Admitting: Internal Medicine

## 2023-12-03 ENCOUNTER — Inpatient Hospital Stay: Payer: Managed Care, Other (non HMO)

## 2023-12-03 ENCOUNTER — Ambulatory Visit: Payer: Managed Care, Other (non HMO) | Admitting: Internal Medicine

## 2023-12-03 ENCOUNTER — Ambulatory Visit: Payer: Managed Care, Other (non HMO)

## 2023-12-03 ENCOUNTER — Other Ambulatory Visit: Payer: Self-pay | Admitting: Physician Assistant

## 2023-12-03 ENCOUNTER — Encounter: Payer: Self-pay | Admitting: Internal Medicine

## 2023-12-03 ENCOUNTER — Other Ambulatory Visit: Payer: Managed Care, Other (non HMO)

## 2023-12-03 VITALS — BP 108/61 | HR 65 | Temp 96.0°F | Resp 17

## 2023-12-03 VITALS — BP 99/66 | HR 71 | Temp 97.1°F | Resp 14 | Wt 114.0 lb

## 2023-12-03 DIAGNOSIS — C679 Malignant neoplasm of bladder, unspecified: Secondary | ICD-10-CM

## 2023-12-03 DIAGNOSIS — Z5112 Encounter for antineoplastic immunotherapy: Secondary | ICD-10-CM

## 2023-12-03 LAB — CMP (CANCER CENTER ONLY)
ALT: 11 U/L (ref 0–44)
AST: 20 U/L (ref 15–41)
Albumin: 3.5 g/dL (ref 3.5–5.0)
Alkaline Phosphatase: 40 U/L (ref 38–126)
Anion gap: 7 (ref 5–15)
BUN: 24 mg/dL — ABNORMAL HIGH (ref 8–23)
CO2: 22 mmol/L (ref 22–32)
Calcium: 8.4 mg/dL — ABNORMAL LOW (ref 8.9–10.3)
Chloride: 109 mmol/L (ref 98–111)
Creatinine: 1.29 mg/dL — ABNORMAL HIGH (ref 0.44–1.00)
GFR, Estimated: 47 mL/min — ABNORMAL LOW (ref >60)
Glucose, Bld: 85 mg/dL (ref 70–99)
Potassium: 3.7 mmol/L (ref 3.5–5.1)
Sodium: 138 mmol/L (ref 135–145)
Total Bilirubin: 0.5 mg/dL (ref 0.0–1.2)
Total Protein: 6.9 g/dL (ref 6.5–8.1)

## 2023-12-03 LAB — CBC WITH DIFFERENTIAL (CANCER CENTER ONLY)
Abs Immature Granulocytes: 0.01 10*3/uL (ref 0.00–0.07)
Basophils Absolute: 0.1 10*3/uL (ref 0.0–0.1)
Basophils Relative: 1 %
Eosinophils Absolute: 1 10*3/uL — ABNORMAL HIGH (ref 0.0–0.5)
Eosinophils Relative: 17 %
HCT: 32 % — ABNORMAL LOW (ref 36.0–46.0)
Hemoglobin: 10.4 g/dL — ABNORMAL LOW (ref 12.0–15.0)
Immature Granulocytes: 0 %
Lymphocytes Relative: 38 %
Lymphs Abs: 2.2 10*3/uL (ref 0.7–4.0)
MCH: 30.5 pg (ref 26.0–34.0)
MCHC: 32.5 g/dL (ref 30.0–36.0)
MCV: 93.8 fL (ref 80.0–100.0)
Monocytes Absolute: 0.5 10*3/uL (ref 0.1–1.0)
Monocytes Relative: 9 %
Neutro Abs: 2 10*3/uL (ref 1.7–7.7)
Neutrophils Relative %: 35 %
Platelet Count: 302 10*3/uL (ref 150–400)
RBC: 3.41 MIL/uL — ABNORMAL LOW (ref 3.87–5.11)
RDW: 14.8 % (ref 11.5–15.5)
WBC Count: 5.8 10*3/uL (ref 4.0–10.5)
nRBC: 0 % (ref 0.0–0.2)

## 2023-12-03 MED ORDER — SODIUM CHLORIDE 0.9 % IV SOLN
INTRAVENOUS | Status: DC
  Filled 2023-12-03: qty 250

## 2023-12-03 MED ORDER — ENFORTUMAB VEDOTIN-EJFV CHEMO 20 MG IV SOLR
1.0000 mg/kg | Freq: Once | INTRAVENOUS | Status: AC
  Administered 2023-12-03: 50 mg via INTRAVENOUS
  Filled 2023-12-03: qty 3

## 2023-12-03 MED ORDER — PROCHLORPERAZINE MALEATE 10 MG PO TABS
10.0000 mg | ORAL_TABLET | Freq: Once | ORAL | Status: AC
  Administered 2023-12-03: 10 mg via ORAL

## 2023-12-03 MED ORDER — MONTELUKAST SODIUM 10 MG PO TABS: 10.0000 mg | ORAL_TABLET | Freq: Every day | ORAL | 0 refills | Status: DC

## 2023-12-03 MED ORDER — HEPARIN SOD (PORK) LOCK FLUSH 100 UNIT/ML IV SOLN
500.0000 [IU] | Freq: Once | INTRAVENOUS | Status: AC | PRN
  Administered 2023-12-03: 500 [IU]
  Filled 2023-12-03: qty 5

## 2023-12-03 NOTE — Progress Notes (Signed)
The pain is in her lower back comes and goes, she tries not to take the tramadol unless it is really bad. Claritin is helping with the itchiness. This week with just having the Keytruda has been good. Just a little fatigue. She wants to discuss a spot she saw on her PET scan on 10/28/2023.

## 2023-12-03 NOTE — Progress Notes (Signed)
[facility] CONSULT NOTE  Patient Care Team: McDonough, Renard Matter as PCP - General (Physician Assistant) Michaelyn Barter, MD as Consulting Physician (Oncology)  REASON FOR REFFERAL: Muscle invasive bladder cancer  CANCER STAGING   Cancer Staging  Urothelial cancer [facility]) Staging form: Kidney, AJCC 8th Edition - Clinical: Stage III (cT3, cN0, cM0) - Signed by Michaelyn Barter, MD on 05/07/2023 Stage prefix: Initial diagnosis - Pathologic stage from 09/25/2023: Stage IV (pM1) - Signed by Michaelyn Barter, MD on 10/25/2023   ASSESSMENT & PLAN:  Barbara [name] 63 y.o. female with pmh of anxiety, GERD, right carotid artery stenosis was referred to medical oncology for new diagnosis of muscle invasive urothelial cancer.  # Metastatic urothelial carcinoma - Patient developed urinary symptoms around September or October 2023.  Was treated with multiple rounds of antibiotics with no improvement.  She was then referred to Dr. Apolinar Junes for further evaluation.  - s/p TURBT with left ureteral stent placement by Dr. Apolinar Junes.  IntraOp findings showed large irregular mucosal submucosal extensive tumor from the left dome, left lateral bladder wall majority of trigone including the left UO and bladder neck.  Measures at least 5 x 5 cm. Pathology showed invasive high-grade urothelial carcinoma. Invades muscularis propria.  - Completed neoadjuvant cycle 4-day 1 of split dose cisplatin and gemcitabine (07/23/2023). Pt declined C4D8 due to poor tolerance/nausea/flank pain.  Split dose cisplatin considered for borderline eGFR/CrCl (36-48).  - s/p robotic adhesiolysis + peritoneal biopsies 09/25/23 showed urothelial cancer with peritoneal carcinomatosis and small + large bowel serosal involvement. Original plan was cystectomy and urinary diversion but this was aborted based on intra-op findings.   -PET scan from 10/28/2023 showed hypermetabolic retroperitoneal and pelvic lymph nodes bilaterally  concerning for nodal metastasis.  8 mm aortocaval node SUV 4.  Hypermetabolic pelvic sidewall nodes 1.1 cm.  Left external iliac node 9 mm SUV 4.4 midline hypermetabolic activity posterior to bladder suspicious for tumor along vaginal cuff.  Mildly enlarged right paratracheal subcarinal lymph node with intermediate activity indeterminate for metastatic disease.  Left ureteral stent in place.  -Cycle 2-day 1 and furosemide was held due to grade 3 maculopapular rash.  Her rash has significantly improved to grade 1.  Patient is very anxious about rechallenge.  Labs reviewed and acceptable for treatment.  Will do a mild dose reduction from 1.25 mg/kg to 1 mg/kg and assess tolerability.  Follow-up in 2 weeks for cycle 3.  -Tempus testing was sent on peritoneal biopsies - no targets. Summary as below.   # Maculopapular rash -Likely secondary to enfortumab vedotin -Grade 3 involving more than 30% BSA with severe pruritus, mild fever. -Improved with supportive care-Claritin, Aveeno moisturizer.  I have also sent a prescription for Singulair in case she needs additional antiallergy meds.  # Hematuria -Chronic likely due to bladder mass.  # Moderate left hydroureteronephrosis s/p left ureteral stent -Left ureteral stent last replaced on 09/25/2023.  Continue follow-up with Dr. Berneice Heinrich.  # Acid reflux/gas/bloating-advised to take OTC simethicone, Tums, Pepcid as needed.  # Coronary atherosclerosis -Detected as incidental finding on CT done for staging purposes. Rest per primary.  # Mild COPD changes -On CT's chest.  Prior history of smoking.  Asymptomatic.  # Access-port placed on 05/03/2023  TEMPUS testing-     Orders Placed This Encounter  Procedures   CBC with Differential (Cancer Center Only)    Standing Status:   Future    Expected Date:   12/17/2023    Expiration Date:  12/16/2024   CMP (Cancer Center only)    Standing Status:   Future    Expected Date:   12/17/2023    Expiration Date:    12/16/2024   T4    Standing Status:   Future    Expected Date:   12/17/2023    Expiration Date:   12/16/2024   TSH    Standing Status:   Future    Expected Date:   12/17/2023    Expiration Date:   12/16/2024   CBC with Differential (Cancer Center Only)    Standing Status:   Future    Expected Date:   12/24/2023    Expiration Date:   12/23/2024   CMP (Cancer Center only)    Standing Status:   Future    Expected Date:   12/24/2023    Expiration Date:   12/23/2024   RTC in 2 weeks for MD visit, labs, cycle 3  The total time spent in the appointment was 30 minutes encounter with patients including review of chart and various tests results, discussions about plan of care and coordination of care plan   All questions were answered. The patient knows to call the clinic with any problems, questions or concerns. No barriers to learning was detected.  Michaelyn Barter, MD 2/11/20253:32 PM   HISTORY OF PRESENTING ILLNESS:  Barbara [name] 63 y.o. female with pmh of pmh of anxiety, GERD, right carotid artery stenosis was referred to medical oncology for new diagnosis of muscle invasive urothelial cancer.  Patient develops urinary symptoms around September or October 2023.  Was treated with multiple rounds of antibiotics with no improvement.  She was then referred to Dr. Apolinar Junes for further evaluation.  CTU from 03/22/2023 showed moderate left hydroureteronephrosis to the UVJ, marked leftward asymmetric wall thickening of the urinary bladder measuring 18 mm in maximal thickness which extends over the UVJ.  Perivesical stranding soft tissue.  No evidence of upper tract or abdominopelvic metastatic disease.  s/p TURBT with left ureteral stent placement by Dr. Apolinar Junes.  IntraOp findings showed large irregular mucosal submucosal extensive tumor from the left dome, left lateral bladder wall majority of trigone including the left UO and bladder neck.  Measures at least 5 x 5 cm.  Pathology showed invasive  high-grade urothelial carcinoma.  There is some discrepancy.  Lower down in the report mentions low-grade.  Invades muscularis propria.  Interval history Patient was seen today as follow-up for stage IV bladder cancer, on Enfortumab and Keytruda. Her rash is significantly improved.  She has a small patch on the left upper part of the chest and on lower back.  Pruritus is improved with Claritin.  She is using Aveeno moisturizer which is helping a lot with dryness and scaliness.   I have reviewed her chart and materials related to her cancer extensively and collaborated history with the patient. Summary of oncologic history is as follows: Oncology History  Urothelial cancer (HCC)  04/12/2023 Initial Diagnosis   Urothelial cancer (HCC)   04/12/2023 Cancer Staging   Staging form: Kidney, AJCC 8th Edition - Clinical: Stage III (cT3, cN0, cM0) - Signed by Michaelyn Barter, MD on 05/07/2023 Stage prefix: Initial diagnosis   09/25/2023 Cancer Staging   Staging form: Kidney, AJCC 8th Edition - Pathologic stage from 09/25/2023: Stage IV (pM1) - Signed by Michaelyn Barter, MD on 10/25/2023   Malignant neoplasm of urinary bladder (HCC)  04/22/2023 Initial Diagnosis   Malignant neoplasm of urinary bladder (HCC)   05/07/2023 - 07/23/2023  Chemotherapy   Patient is on Treatment Plan : BLADDER Gemcitabine D1,8 + Cisplatin (split dose) D1,8 q21d x 4 cycles     11/05/2023 -  Chemotherapy   Patient is on Treatment Plan : UROTHELIAL ADVANCED, METASTATIC ENFORTUMAB D1, D8 + PEMBROLIZUMAB (200) D1 Q21D       MEDICAL HISTORY:  Past Medical History:  Diagnosis Date   Adopted    Allergic rhinitis    Anemia    h/o   Anxiety    Arthritis    B12 deficiency    Bilateral carotid artery stenosis    Bilateral carotid bruits    Bladder tumor    Cancer (HCC)    bladder cancer   Depression    Gallbladder polyp    GERD (gastroesophageal reflux disease)    Heart murmur    Hydronephrosis of left kidney    Migraines     Tobacco abuse    UTI (urinary tract infection)     SURGICAL HISTORY: Past Surgical History:  Procedure Laterality Date   ABDOMINAL HYSTERECTOMY     BLADDER SURGERY     BREAST BIOPSY Left 2014   neg   CYSTOSCOPY W/ URETERAL STENT PLACEMENT Left 04/04/2023   Procedure: CYSTOSCOPY WITH RETROGRADE PYELOGRAM/URETERAL STENT PLACEMENT;  Surgeon: Vanna Scotland, MD;  Location: [facility] ORS;  Service: Urology;  Laterality: Left;   FOOT SURGERY Right    2009   IR IMAGING GUIDED PORT INSERTION  05/03/2023   LAPAROSCOPIC APPENDECTOMY  12/05/2012   NASAL SEPTUM SURGERY     ROBOT ASSISTED LAPAROSCOPIC COMPLETE CYSTECT ILEAL CONDUIT N/A 09/25/2023   Procedure: XI ROBOTIC ASSISTED LAPAROSCOPIC EXTENSIVE LYSIS OF ADHESIONS, PERITONEAL BIOPSIES;  Surgeon: Loletta Parish., MD;  Location: [facility];  Service: Urology;  Laterality: N/A;   SISTRUNK PROCEDURE  03/03/2012   Thyroglossal duct cyst excision   TONSILLECTOMY     TRANSURETHRAL RESECTION OF BLADDER TUMOR N/A 04/04/2023   Procedure: TRANSURETHRAL RESECTION OF BLADDER TUMOR (TURBT);  Surgeon: Vanna Scotland, MD;  Location: [facility] ORS;  Service: Urology;  Laterality: N/A;    SOCIAL HISTORY: Social History   Socioeconomic History   Marital status: Married    Spouse name: Not on file   Number of children: Not on file   Years of education: Not on file   Highest education level: Not on file  Occupational History   Not on file  Tobacco Use   Smoking status: Former    Current packs/day: 0.25    Average packs/day: 0.3 packs/day for 30.0 years (7.5 ttl pk-yrs)    Types: Cigarettes    Passive exposure: Past   Smokeless tobacco: Never  Vaping Use   Vaping status: Never Used  Substance and Sexual Activity   Alcohol use: Never   Drug use: Never   Sexual activity: Not on file  Other Topics Concern   Not on file  Social History Narrative   Not on file   Social Drivers of Health   Financial Resource Strain: Not on file  Food Insecurity: No  Food Insecurity (09/25/2023)   Hunger Vital Sign    Worried About Running Out of Food in the Last Year: Never true    Ran Out of Food in the Last Year: Never true  Transportation Needs: No Transportation Needs (09/25/2023)   PRAPARE - Administrator, Civil Service (Medical): No    Lack of Transportation (Non-Medical): No  Physical Activity: Not on file  Stress: Not on file  Social Connections: Not on file  Intimate Partner Violence: Not At Risk (09/25/2023)   Humiliation, Afraid, Rape, and Kick questionnaire    Fear of Current or Ex-Partner: No    Emotionally Abused: No    Physically Abused: No    Sexually Abused: No    FAMILY HISTORY: Family History  Adopted: Yes    ALLERGIES:  is allergic to pseudoephedrine hcl.  MEDICATIONS:  Current Outpatient Medications  Medication Sig Dispense Refill   montelukast (SINGULAIR) 10 MG tablet Take 1 tablet (10 mg total) by mouth at bedtime. As needed if breaks out into rash 30 tablet 0   Carboxymethylcell-Glycerin PF 0.5-0.9 % SOLN Place 2 drops into both eyes 4 (four) times daily. 1 each 11   carboxymethylcellulose (REFRESH PLUS) 0.5 % SOLN Place 1 drop into both eyes 3 (three) times daily as needed (dry eyes).     carisoprodol (SOMA) 350 MG tablet Take 1 tablet (350 mg total) by mouth at bedtime as needed for muscle spasms. 90 tablet 0   celecoxib (CELEBREX) 200 MG capsule TAKE 1 CAPSULE BY MOUTH DAILY 90 capsule 3   dicyclomine (BENTYL) 10 MG capsule Take 1 capsule (10 mg total) by mouth 3 (three) times daily as needed for up to 30 doses for spasms. 30 capsule 0   docusate sodium (COLACE) 100 MG capsule Take 1 capsule (100 mg total) by mouth 2 (two) times daily.     escitalopram (LEXAPRO) 5 MG tablet Take 5 mg by mouth daily.     estradiol (ESTRACE) 1 MG tablet TAKE 1 TABLET BY MOUTH DAILY 90 tablet 3   lidocaine-prilocaine (EMLA) cream Apply 1 Application topically as needed. 30 g 0   ondansetron (ZOFRAN) 8 MG tablet Take 1  tablet (8 mg total) by mouth every 8 (eight) hours as needed for up to 30 doses for nausea or vomiting. 30 tablet 0   oxybutynin (DITROPAN) 5 MG tablet Take 1 tablet (5 mg total) by mouth every 8 (eight) hours as needed for bladder spasms. 30 tablet 0   prochlorperazine (COMPAZINE) 10 MG tablet Take 1 tablet (10 mg total) by mouth every 6 (six) hours as needed for nausea or vomiting. 30 tablet 0   topiramate (TOPAMAX) 50 MG tablet Take 2 tablets (100 mg total) by mouth at bedtime. 180 tablet 3   traMADol (ULTRAM) 50 MG tablet Take 1 tablet (50 mg total) by mouth every 12 (twelve) hours as needed. 30 tablet 0   trimethoprim (TRIMPEX) 100 MG tablet Take 1 tablet (100 mg total) by mouth daily. 30 tablet 11   No current facility-administered medications for this visit.   Facility-Administered Medications Ordered in Other Visits  Medication Dose Route Frequency Provider Last Rate Last Admin   0.9 %  sodium chloride infusion   Intravenous Continuous Michaelyn Barter, MD 10 mL/hr at 12/03/23 1500 New Bag at 12/03/23 1500   enfortumab vedotin-ejfv (PADCEV) 50 mg in sodium chloride 0.9 % 50 mL (0.9091 mg/mL) chemo infusion  1 mg/kg (Treatment Plan Recorded) Intravenous Once Michaelyn Barter, MD 110 mL/hr at 12/03/23 1529 50 mg at 12/03/23 1529   heparin lock flush 100 unit/mL  500 Units Intravenous Once Michaelyn Barter, MD       heparin lock flush 100 unit/mL  500 Units Intravenous Once Michaelyn Barter, MD       heparin lock flush 100 unit/mL  500 Units Intravenous Once Michaelyn Barter, MD       sodium chloride flush (NS) 0.9 % injection 10 mL  10 mL Intravenous Once Alena Bills,  Candise Che, MD       sodium chloride flush (NS) 0.9 % injection 10 mL  10 mL Intravenous Once Michaelyn Barter, MD       sodium chloride flush (NS) 0.9 % injection 10 mL  10 mL Intravenous Once Michaelyn Barter, MD        REVIEW OF SYSTEMS:   Pertinent information mentioned in HPI All other systems were reviewed with the patient and are  negative.  PHYSICAL EXAMINATION: ECOG PERFORMANCE STATUS: 1 - Symptomatic but completely ambulatory  Vitals:   12/03/23 1427  BP: 99/66  Pulse: 71  Resp: 14  Temp: (!) 97.1 F (36.2 C)  SpO2: 100%    Filed Weights   12/03/23 1427  Weight: 114 lb (51.7 kg)     GENERAL:alert, no distress and comfortable SKIN: skin color, texture, turgor are normal, no rashes or significant lesions EYES: normal, conjunctiva are pink and non-injected, sclera clear OROPHARYNX:no exudate, no erythema and lips, buccal mucosa, and tongue normal  NECK: supple, thyroid normal size, non-tender, without nodularity LYMPH:  no palpable lymphadenopathy in the cervical, axillary or inguinal LUNGS: clear to auscultation and percussion with normal breathing effort HEART: regular rate & rhythm and no murmurs and no lower extremity edema ABDOMEN:abdomen soft, non-tender and normal bowel sounds Musculoskeletal:no cyanosis of digits and no clubbing  PSYCH: alert & oriented x 3 with fluent speech NEURO: no focal motor/sensory deficits  LABORATORY DATA:  I have reviewed the data as listed Lab Results  Component Value Date   WBC 5.8 12/03/2023   HGB 10.4 (L) 12/03/2023   HCT 32.0 (L) 12/03/2023   MCV 93.8 12/03/2023   PLT 302 12/03/2023   Recent Labs    11/12/23 0903 11/26/23 0927 12/03/23 1411  NA 135 135 138  K 3.4* 3.3* 3.7  CL 103 105 109  CO2 23 22 22   GLUCOSE 90 102* 85  BUN 25* 22 24*  CREATININE 1.30* 1.25* 1.29*  CALCIUM 8.8* 8.5* 8.4*  GFRNONAA 46* 49* 47*  PROT 7.4 7.2 6.9  ALBUMIN 3.6 3.4* 3.5  AST 23 22 20   ALT 10 14 11   ALKPHOS 42 39 40  BILITOT 0.6 0.5 0.5    RADIOGRAPHIC STUDIES: I have personally reviewed the radiological images as listed and agreed with the findings in the report. No results found.

## 2023-12-03 NOTE — Patient Instructions (Signed)

## 2023-12-04 ENCOUNTER — Telehealth: Payer: Self-pay

## 2023-12-04 ENCOUNTER — Encounter: Payer: Self-pay | Admitting: Internal Medicine

## 2023-12-04 NOTE — Telephone Encounter (Signed)
Please review to send

## 2023-12-04 NOTE — Telephone Encounter (Signed)
I just sent patient a message with my email address if she needed to send something, I can just print it off and give it to whom ever I need to give it to.

## 2023-12-05 NOTE — Telephone Encounter (Signed)
Patient to send letter/form. Will review what is needed once we receive information from patient.

## 2023-12-11 ENCOUNTER — Encounter: Payer: Self-pay | Admitting: Internal Medicine

## 2023-12-17 ENCOUNTER — Encounter: Payer: Self-pay | Admitting: Internal Medicine

## 2023-12-17 ENCOUNTER — Inpatient Hospital Stay: Payer: Managed Care, Other (non HMO)

## 2023-12-17 ENCOUNTER — Inpatient Hospital Stay: Payer: Managed Care, Other (non HMO) | Admitting: Internal Medicine

## 2023-12-17 VITALS — BP 111/67 | HR 78 | Temp 97.3°F | Resp 19 | Wt 114.0 lb

## 2023-12-17 VITALS — BP 114/68 | HR 69

## 2023-12-17 DIAGNOSIS — Z599 Problem related to housing and economic circumstances, unspecified: Secondary | ICD-10-CM

## 2023-12-17 DIAGNOSIS — C679 Malignant neoplasm of bladder, unspecified: Secondary | ICD-10-CM

## 2023-12-17 DIAGNOSIS — Z5112 Encounter for antineoplastic immunotherapy: Secondary | ICD-10-CM | POA: Diagnosis not present

## 2023-12-17 LAB — CMP (CANCER CENTER ONLY)
ALT: 13 U/L (ref 0–44)
AST: 22 U/L (ref 15–41)
Albumin: 3.6 g/dL (ref 3.5–5.0)
Alkaline Phosphatase: 44 U/L (ref 38–126)
Anion gap: 7 (ref 5–15)
BUN: 22 mg/dL (ref 8–23)
CO2: 22 mmol/L (ref 22–32)
Calcium: 8.5 mg/dL — ABNORMAL LOW (ref 8.9–10.3)
Chloride: 107 mmol/L (ref 98–111)
Creatinine: 1.19 mg/dL — ABNORMAL HIGH (ref 0.44–1.00)
GFR, Estimated: 52 mL/min — ABNORMAL LOW (ref >60)
Glucose, Bld: 114 mg/dL — ABNORMAL HIGH (ref 70–99)
Potassium: 3.4 mmol/L — ABNORMAL LOW (ref 3.5–5.1)
Sodium: 136 mmol/L (ref 135–145)
Total Bilirubin: 0.6 mg/dL (ref 0.0–1.2)
Total Protein: 7.1 g/dL (ref 6.5–8.1)

## 2023-12-17 LAB — CBC WITH DIFFERENTIAL (CANCER CENTER ONLY)
Abs Immature Granulocytes: 0.02 10*3/uL (ref 0.00–0.07)
Basophils Absolute: 0 10*3/uL (ref 0.0–0.1)
Basophils Relative: 0 %
Eosinophils Absolute: 2.5 10*3/uL — ABNORMAL HIGH (ref 0.0–0.5)
Eosinophils Relative: 35 %
HCT: 32.3 % — ABNORMAL LOW (ref 36.0–46.0)
Hemoglobin: 10.4 g/dL — ABNORMAL LOW (ref 12.0–15.0)
Immature Granulocytes: 0 %
Lymphocytes Relative: 29 %
Lymphs Abs: 2.2 10*3/uL (ref 0.7–4.0)
MCH: 31 pg (ref 26.0–34.0)
MCHC: 32.2 g/dL (ref 30.0–36.0)
MCV: 96.1 fL (ref 80.0–100.0)
Monocytes Absolute: 0.5 10*3/uL (ref 0.1–1.0)
Monocytes Relative: 6 %
Neutro Abs: 2.3 10*3/uL (ref 1.7–7.7)
Neutrophils Relative %: 30 %
Platelet Count: 242 10*3/uL (ref 150–400)
RBC: 3.36 MIL/uL — ABNORMAL LOW (ref 3.87–5.11)
RDW: 15.5 % (ref 11.5–15.5)
WBC Count: 7.5 10*3/uL (ref 4.0–10.5)
nRBC: 0 % (ref 0.0–0.2)

## 2023-12-17 LAB — TSH: TSH: 1.3 u[IU]/mL (ref 0.350–4.500)

## 2023-12-17 MED ORDER — PROCHLORPERAZINE MALEATE 10 MG PO TABS
10.0000 mg | ORAL_TABLET | Freq: Once | ORAL | Status: AC
  Administered 2023-12-17: 10 mg via ORAL
  Filled 2023-12-17: qty 1

## 2023-12-17 MED ORDER — PEMBROLIZUMAB CHEMO INJECTION 100 MG/4ML
200.0000 mg | Freq: Once | INTRAVENOUS | Status: AC
  Administered 2023-12-17: 200 mg via INTRAVENOUS
  Filled 2023-12-17: qty 200

## 2023-12-17 MED ORDER — SODIUM CHLORIDE 0.9 % IV SOLN
INTRAVENOUS | Status: DC
  Filled 2023-12-17: qty 250

## 2023-12-17 MED ORDER — HEPARIN SOD (PORK) LOCK FLUSH 100 UNIT/ML IV SOLN
500.0000 [IU] | Freq: Once | INTRAVENOUS | Status: AC | PRN
  Administered 2023-12-17: 500 [IU]
  Filled 2023-12-17: qty 5

## 2023-12-17 MED ORDER — SODIUM CHLORIDE 0.9 % IV SOLN
1.0000 mg/kg | Freq: Once | INTRAVENOUS | Status: AC
  Administered 2023-12-17: 50 mg via INTRAVENOUS
  Filled 2023-12-17: qty 3

## 2023-12-17 NOTE — Patient Instructions (Signed)
 [facility] - A DEPT OF [facility]  Discharge Instructions: Thank you for choosing [facility] to provide your oncology and hematology care.  If you have a lab appointment with the Cancer Center, please go directly to the Cancer Center and check in at the registration area.  Wear comfortable clothing and clothing appropriate for easy access to any Portacath or PICC line.   We strive to give you quality time with your provider. You may need to reschedule your appointment if you arrive late (15 or more minutes).  Arriving late affects you and other patients whose appointments are after yours.  Also, if you miss three or more appointments without notifying the office, you may be dismissed from the clinic at the provider's discretion.      For prescription refill requests, have your pharmacy contact our office and allow 72 hours for refills to be completed.    Today you received the following chemotherapy and/or immunotherapy agents Padcev and Keytruda      To help prevent nausea and vomiting after your treatment, we encourage you to take your nausea medication as directed.  BELOW ARE SYMPTOMS THAT SHOULD BE REPORTED IMMEDIATELY: *FEVER GREATER THAN 100.4 F (38 C) OR HIGHER *CHILLS OR SWEATING *NAUSEA AND VOMITING THAT IS NOT CONTROLLED WITH YOUR NAUSEA MEDICATION *UNUSUAL SHORTNESS OF BREATH *UNUSUAL BRUISING OR BLEEDING *URINARY PROBLEMS (pain or burning when urinating, or frequent urination) *BOWEL PROBLEMS (unusual diarrhea, constipation, pain near the anus) TENDERNESS IN MOUTH AND THROAT WITH OR WITHOUT PRESENCE OF ULCERS (sore throat, sores in mouth, or a toothache) UNUSUAL RASH, SWELLING OR PAIN  UNUSUAL VAGINAL DISCHARGE OR ITCHING   Items with * indicate a potential emergency and should be followed up as soon as possible or go to the Emergency Department if any problems should occur.  Please show the CHEMOTHERAPY ALERT CARD or  IMMUNOTHERAPY ALERT CARD at check-in to the Emergency Department and triage nurse.  Should you have questions after your visit or need to cancel or reschedule your appointment, please contact [facility] - A DEPT OF [facility]  [phone] and follow the prompts.  Office hours are 8:00 a.m. to 4:30 p.m. Monday - Friday. Please note that voicemails left after 4:00 p.m. may not be returned until the following business day.  We are closed weekends and major holidays. You have access to a nurse at all times for urgent questions. Please call the main number to the clinic [phone] and follow the prompts.  For any non-urgent questions, you may also contact your provider using MyChart. We now offer e-Visits for anyone 58 and older to request care online for non-urgent symptoms. For details visit [email or web address].   Also download the MyChart app! Go to the app store, search "MyChart", open the app, select [place], and log in with your MyChart username and password.

## 2023-12-17 NOTE — Progress Notes (Signed)
 [facility] CONSULT NOTE  Patient Care Team: McDonough, Renard Matter as PCP - General (Physician Assistant) Michaelyn Barter, MD as Consulting Physician (Oncology)  REASON FOR REFFERAL: Muscle invasive bladder cancer  CANCER STAGING   Cancer Staging  Urothelial cancer [facility]) Staging form: Kidney, AJCC 8th Edition - Clinical: Stage III (cT3, cN0, cM0) - Signed by Michaelyn Barter, MD on 05/07/2023 Stage prefix: Initial diagnosis - Pathologic stage from 09/25/2023: Stage IV (pM1) - Signed by Michaelyn Barter, MD on 10/25/2023   ASSESSMENT & PLAN:  Barbara [name] 63 y.o. female with pmh of anxiety, GERD, right carotid artery stenosis was referred to medical oncology for new diagnosis of muscle invasive urothelial cancer.  # Metastatic urothelial carcinoma - Patient developed urinary symptoms around September or October 2023.  Was treated with multiple rounds of antibiotics with no improvement.  She was then referred to Dr. Apolinar Junes for further evaluation.  - s/p TURBT with left ureteral stent placement by Dr. Apolinar Junes.  IntraOp findings showed large irregular mucosal submucosal extensive tumor from the left dome, left lateral bladder wall majority of trigone including the left UO and bladder neck.  Measures at least 5 x 5 cm. Pathology showed invasive high-grade urothelial carcinoma. Invades muscularis propria.  - Completed neoadjuvant cycle 4-day 1 of split dose cisplatin and gemcitabine (07/23/2023). Pt declined C4D8 due to poor tolerance/nausea/flank pain.  Split dose cisplatin considered for borderline eGFR/CrCl (36-48).  - s/p robotic adhesiolysis + peritoneal biopsies 09/25/23 showed urothelial cancer with peritoneal carcinomatosis and small + large bowel serosal involvement. Original plan was cystectomy and urinary diversion but this was aborted based on intra-op findings.   -PET scan from 10/28/2023 showed hypermetabolic retroperitoneal and pelvic lymph nodes bilaterally  concerning for nodal metastasis.  8 mm aortocaval node SUV 4.  Hypermetabolic pelvic sidewall nodes 1.1 cm.  Left external iliac node 9 mm SUV 4.4 midline hypermetabolic activity posterior to bladder suspicious for tumor along vaginal cuff.  Mildly enlarged right paratracheal subcarinal lymph node with intermediate activity indeterminate for metastatic disease.  Left ureteral stent in place.  -Labs reviewed and acceptable for treatment.  Will proceed with enfortumab vedotin 1 mg/kg and Keytruda cycle 3-day 1 today.  Her rash is significantly improved.  Repeat imaging after completion of 3 cycles to assess treatment response.  -Tempus testing was sent on peritoneal biopsies - no targets. Summary as below.   # Maculopapular rash -Likely secondary to enfortumab vedotin -Grade 3 involving more than 30% BSA with severe pruritus, mild fever. -Improved with supportive care-Claritin, Aveeno moisturizer.  I have also sent a prescription for Singulair in case she needs additional antiallergy meds.  # Hematuria -Chronic likely due to bladder mass.  # Moderate left hydroureteronephrosis s/p left ureteral stent -Left ureteral stent last replaced on 09/25/2023.  Continue follow-up with Dr. Berneice Heinrich.  # Acid reflux/gas/bloating-advised to take OTC simethicone, Tums, Pepcid as needed.  # Coronary atherosclerosis -Detected as incidental finding on CT done for staging purposes. Rest per primary.  # Mild COPD changes -On CT's chest.  Prior history of smoking.  Asymptomatic.  # Access-port placed on 05/03/2023  TEMPUS testing-     Orders Placed This Encounter  Procedures   CT CHEST ABDOMEN PELVIS W CONTRAST    Standing Status:   Future    Expected Date:   01/07/2024    Expiration Date:   12/16/2024    If indicated for the ordered procedure, I authorize the administration of contrast media per Radiology protocol:  Yes    Does the patient have a contrast media/X-ray dye allergy?:   No    Preferred  imaging location?:   [facility]    If indicated for the ordered procedure, I authorize the administration of oral contrast media per Radiology protocol:   Yes   Ambulatory referral to Social Work    Referral Priority:   Routine    Referral Type:   Consultation    Referral Reason:   Specialty Services Required    Number of Visits Requested:   1   RTC in 1 weeks for MD visit, labs, cycle 3  The total time spent in the appointment was 30 minutes encounter with patients including review of chart and various tests results, discussions about plan of care and coordination of care plan   All questions were answered. The patient knows to call the clinic with any problems, questions or concerns. No barriers to learning was detected.  Michaelyn Barter, MD 2/25/20254:06 PM   HISTORY OF PRESENTING ILLNESS:  Barbara [name] 63 y.o. female with pmh of pmh of anxiety, GERD, right carotid artery stenosis was referred to medical oncology for new diagnosis of muscle invasive urothelial cancer.  Patient develops urinary symptoms around September or October 2023.  Was treated with multiple rounds of antibiotics with no improvement.  She was then referred to Dr. Apolinar Junes for further evaluation.  CTU from 03/22/2023 showed moderate left hydroureteronephrosis to the UVJ, marked leftward asymmetric wall thickening of the urinary bladder measuring 18 mm in maximal thickness which extends over the UVJ.  Perivesical stranding soft tissue.  No evidence of upper tract or abdominopelvic metastatic disease.  s/p TURBT with left ureteral stent placement by Dr. Apolinar Junes.  IntraOp findings showed large irregular mucosal submucosal extensive tumor from the left dome, left lateral bladder wall majority of trigone including the left UO and bladder neck.  Measures at least 5 x 5 cm.  Pathology showed invasive high-grade urothelial carcinoma.  There is some discrepancy.  Lower down in the report mentions low-grade.  Invades  muscularis propria.  Interval history Patient was seen today as follow-up for stage IV bladder cancer, on Enfortumab and Keytruda. Last time, she tolerated lower dose of Enfortumab better.  Her rash is significantly improved.  She is using Singulair which is helping with symptom relief.  She has been feeling frustrated about not able to taste food and hoping if she can only get it on day 8.  She is also stressed about the bills that she has received from insurance claiming that they would not approve the treatment.  She has provided [place] with the letters and the team will continue that further.  I have reviewed her chart and materials related to her cancer extensively and collaborated history with the patient. Summary of oncologic history is as follows: Oncology History  Urothelial cancer (HCC)  04/12/2023 Initial Diagnosis   Urothelial cancer (HCC)   04/12/2023 Cancer Staging   Staging form: Kidney, AJCC 8th Edition - Clinical: Stage III (cT3, cN0, cM0) - Signed by Michaelyn Barter, MD on 05/07/2023 Stage prefix: Initial diagnosis   09/25/2023 Cancer Staging   Staging form: Kidney, AJCC 8th Edition - Pathologic stage from 09/25/2023: Stage IV (pM1) - Signed by Michaelyn Barter, MD on 10/25/2023   Malignant neoplasm of urinary bladder (HCC)  04/22/2023 Initial Diagnosis   Malignant neoplasm of urinary bladder (HCC)   05/07/2023 - 07/23/2023 Chemotherapy   Patient is on Treatment Plan : BLADDER Gemcitabine D1,8 + Cisplatin (  split dose) D1,8 q21d x 4 cycles     11/05/2023 -  Chemotherapy   Patient is on Treatment Plan : UROTHELIAL ADVANCED, METASTATIC ENFORTUMAB D1, D8 + PEMBROLIZUMAB (200) D1 Q21D       MEDICAL HISTORY:  Past Medical History:  Diagnosis Date   Adopted    Allergic rhinitis    Anemia    h/o   Anxiety    Arthritis    B12 deficiency    Bilateral carotid artery stenosis    Bilateral carotid bruits    Bladder tumor    Cancer (HCC)    bladder cancer   Depression     Gallbladder polyp    GERD (gastroesophageal reflux disease)    Heart murmur    Hydronephrosis of left kidney    Migraines    Tobacco abuse    UTI (urinary tract infection)     SURGICAL HISTORY: Past Surgical History:  Procedure Laterality Date   ABDOMINAL HYSTERECTOMY     BLADDER SURGERY     BREAST BIOPSY Left 2014   neg   CYSTOSCOPY W/ URETERAL STENT PLACEMENT Left 04/04/2023   Procedure: CYSTOSCOPY WITH RETROGRADE PYELOGRAM/URETERAL STENT PLACEMENT;  Surgeon: Vanna Scotland, MD;  Location: [facility] ORS;  Service: Urology;  Laterality: Left;   FOOT SURGERY Right    2009   IR IMAGING GUIDED PORT INSERTION  05/03/2023   LAPAROSCOPIC APPENDECTOMY  12/05/2012   NASAL SEPTUM SURGERY     ROBOT ASSISTED LAPAROSCOPIC COMPLETE CYSTECT ILEAL CONDUIT N/A 09/25/2023   Procedure: XI ROBOTIC ASSISTED LAPAROSCOPIC EXTENSIVE LYSIS OF ADHESIONS, PERITONEAL BIOPSIES;  Surgeon: Loletta Parish., MD;  Location: [facility] OR[facility];  Service: Urology;  Laterality: N/A;   SISTRUNK PROCEDURE  03/03/2012   Thyroglossal duct cyst excision   TONSILLECTOMY     TRANSURETHRAL RESECTION OF BLADDER TUMOR N/A 04/04/2023   Procedure: TRANSURETHRAL RESECTION OF BLADDER TUMOR (TURBT);  Surgeon: Vanna Scotland, MD;  Location: [facility] ORS;  Service: Urology;  Laterality: N/A;    SOCIAL HISTORY: Social History   Socioeconomic History   Marital status: Married    Spouse name: Not on file   Number of children: Not on file   Years of education: Not on file   Highest education level: Not on file  Occupational History   Not on file  Tobacco Use   Smoking status: Former    Current packs/day: 0.25    Average packs/day: 0.3 packs/day for 30.0 years (7.5 ttl pk-yrs)    Types: Cigarettes    Passive exposure: Past   Smokeless tobacco: Never  Vaping Use   Vaping status: Never Used  Substance and Sexual Activity   Alcohol use: Never   Drug use: Never   Sexual activity: Not on file  Other Topics Concern   Not on file  Social  History Narrative   Not on file   Social Drivers of Health   Financial Resource Strain: Not on file  Food Insecurity: No Food Insecurity (09/25/2023)   Hunger Vital Sign    Worried About Running Out of Food in the Last Year: Never true    Ran Out of Food in the Last Year: Never true  Transportation Needs: No Transportation Needs (09/25/2023)   PRAPARE - Administrator, Civil Service (Medical): No    Lack of Transportation (Non-Medical): No  Physical Activity: Not on file  Stress: Not on file  Social Connections: Not on file  Intimate Partner Violence: Not At Risk (09/25/2023)   Humiliation, Afraid, Rape, and  Kick questionnaire    Fear of Current or Ex-Partner: No    Emotionally Abused: No    Physically Abused: No    Sexually Abused: No    FAMILY HISTORY: Family History  Adopted: Yes    ALLERGIES:  is allergic to pseudoephedrine hcl.  MEDICATIONS:  Current Outpatient Medications  Medication Sig Dispense Refill   Carboxymethylcell-Glycerin PF 0.5-0.9 % SOLN Place 2 drops into both eyes 4 (four) times daily. 1 each 11   carboxymethylcellulose (REFRESH PLUS) 0.5 % SOLN Place 1 drop into both eyes 3 (three) times daily as needed (dry eyes).     carisoprodol (SOMA) 350 MG tablet Take 1 tablet (350 mg total) by mouth at bedtime as needed for muscle spasms. 90 tablet 0   dicyclomine (BENTYL) 10 MG capsule Take 1 capsule (10 mg total) by mouth 3 (three) times daily as needed for up to 30 doses for spasms. 30 capsule 0   docusate sodium (COLACE) 100 MG capsule Take 1 capsule (100 mg total) by mouth 2 (two) times daily.     escitalopram (LEXAPRO) 5 MG tablet TAKE 1 TABLET BY MOUTH DAILY 90 tablet 1   estradiol (ESTRACE) 1 MG tablet TAKE 1 TABLET BY MOUTH DAILY 90 tablet 3   lidocaine-prilocaine (EMLA) cream Apply 1 Application topically as needed. 30 g 0   montelukast (SINGULAIR) 10 MG tablet Take 1 tablet (10 mg total) by mouth at bedtime. As needed if breaks out into rash 30  tablet 0   ondansetron (ZOFRAN) 8 MG tablet Take 1 tablet (8 mg total) by mouth every 8 (eight) hours as needed for up to 30 doses for nausea or vomiting. 30 tablet 0   oxybutynin (DITROPAN) 5 MG tablet Take 1 tablet (5 mg total) by mouth every 8 (eight) hours as needed for bladder spasms. 30 tablet 0   prochlorperazine (COMPAZINE) 10 MG tablet Take 1 tablet (10 mg total) by mouth every 6 (six) hours as needed for nausea or vomiting. 30 tablet 0   topiramate (TOPAMAX) 50 MG tablet Take 2 tablets (100 mg total) by mouth at bedtime. 180 tablet 3   traMADol (ULTRAM) 50 MG tablet Take 1 tablet (50 mg total) by mouth every 12 (twelve) hours as needed. 30 tablet 0   trimethoprim (TRIMPEX) 100 MG tablet Take 1 tablet (100 mg total) by mouth daily. 30 tablet 11   celecoxib (CELEBREX) 200 MG capsule TAKE 1 CAPSULE BY MOUTH DAILY (Patient not taking: Reported on 12/17/2023) 90 capsule 3   No current facility-administered medications for this visit.   Facility-Administered Medications Ordered in Other Visits  Medication Dose Route Frequency Provider Last Rate Last Admin   0.9 %  sodium chloride infusion   Intravenous Continuous Michaelyn Barter, MD 10 mL/hr at 12/17/23 1405 New Bag at 12/17/23 1405   heparin lock flush 100 unit/mL  500 Units Intravenous Once Michaelyn Barter, MD       heparin lock flush 100 unit/mL  500 Units Intravenous Once Michaelyn Barter, MD       heparin lock flush 100 unit/mL  500 Units Intravenous Once Michaelyn Barter, MD       heparin lock flush 100 unit/mL  500 Units Intracatheter Once PRN Michaelyn Barter, MD       pembrolizumab [facility]) 200 mg in sodium chloride 0.9 % 50 mL chemo infusion  200 mg Intravenous Once Michaelyn Barter, MD 116 mL/hr at 12/17/23 1551 200 mg at 12/17/23 1551   sodium chloride flush (NS) 0.9 % injection  10 mL  10 mL Intravenous Once Michaelyn Barter, MD       sodium chloride flush (NS) 0.9 % injection 10 mL  10 mL Intravenous Once Michaelyn Barter, MD        sodium chloride flush (NS) 0.9 % injection 10 mL  10 mL Intravenous Once Michaelyn Barter, MD        REVIEW OF SYSTEMS:   Pertinent information mentioned in HPI All other systems were reviewed with the patient and are negative.  PHYSICAL EXAMINATION: ECOG PERFORMANCE STATUS: 1 - Symptomatic but completely ambulatory  Vitals:   12/17/23 1311  BP: 111/67  Pulse: 78  Resp: 19  Temp: (!) 97.3 F (36.3 C)  SpO2: 98%    Filed Weights   12/17/23 1311  Weight: 114 lb (51.7 kg)     GENERAL:alert, no distress and comfortable SKIN: skin color, texture, turgor are normal, no rashes or significant lesions EYES: normal, conjunctiva are pink and non-injected, sclera clear OROPHARYNX:no exudate, no erythema and lips, buccal mucosa, and tongue normal  NECK: supple, thyroid normal size, non-tender, without nodularity LYMPH:  no palpable lymphadenopathy in the cervical, axillary or inguinal LUNGS: clear to auscultation and percussion with normal breathing effort HEART: regular rate & rhythm and no murmurs and no lower extremity edema ABDOMEN:abdomen soft, non-tender and normal bowel sounds Musculoskeletal:no cyanosis of digits and no clubbing  PSYCH: alert & oriented x 3 with fluent speech NEURO: no focal motor/sensory deficits  LABORATORY DATA:  I have reviewed the data as listed Lab Results  Component Value Date   WBC 7.5 12/17/2023   HGB 10.4 (L) 12/17/2023   HCT 32.3 (L) 12/17/2023   MCV 96.1 12/17/2023   PLT 242 12/17/2023   Recent Labs    11/26/23 0927 12/03/23 1411 12/17/23 1250  NA 135 138 136  K 3.3* 3.7 3.4*  CL 105 109 107  CO2 22 22 22   GLUCOSE 102* 85 114*  BUN 22 24* 22  CREATININE 1.25* 1.29* 1.19*  CALCIUM 8.5* 8.4* 8.5*  GFRNONAA 49* 47* 52*  PROT 7.2 6.9 7.1  ALBUMIN 3.4* 3.5 3.6  AST 22 20 22   ALT 14 11 13   ALKPHOS 39 40 44  BILITOT 0.5 0.5 0.6    RADIOGRAPHIC STUDIES: I have personally reviewed the radiological images as listed and agreed with  the findings in the report. No results found.

## 2023-12-18 LAB — T4: T4, Total: 9.5 ug/dL (ref 4.5–12.0)

## 2023-12-19 ENCOUNTER — Encounter: Payer: Self-pay | Admitting: Internal Medicine

## 2023-12-19 ENCOUNTER — Telehealth: Payer: Self-pay

## 2023-12-19 NOTE — Telephone Encounter (Signed)
 Clinical Social Work was referred by medical provider for assessment of psychosocial needs.  CSW attempted to contact patient by phone.  Left voicemail with contact information and request for return call.

## 2023-12-20 ENCOUNTER — Other Ambulatory Visit: Payer: Self-pay

## 2023-12-20 DIAGNOSIS — Z5112 Encounter for antineoplastic immunotherapy: Secondary | ICD-10-CM

## 2023-12-20 DIAGNOSIS — C679 Malignant neoplasm of bladder, unspecified: Secondary | ICD-10-CM

## 2023-12-24 ENCOUNTER — Inpatient Hospital Stay: Payer: Managed Care, Other (non HMO) | Admitting: Internal Medicine

## 2023-12-24 ENCOUNTER — Encounter: Payer: Self-pay | Admitting: Internal Medicine

## 2023-12-24 ENCOUNTER — Inpatient Hospital Stay: Payer: Managed Care, Other (non HMO)

## 2023-12-24 ENCOUNTER — Inpatient Hospital Stay: Payer: Managed Care, Other (non HMO) | Attending: Internal Medicine

## 2023-12-24 VITALS — BP 110/65 | HR 108 | Temp 99.8°F | Resp 16 | Wt 111.0 lb

## 2023-12-24 DIAGNOSIS — Z79899 Other long term (current) drug therapy: Secondary | ICD-10-CM | POA: Insufficient documentation

## 2023-12-24 DIAGNOSIS — R051 Acute cough: Secondary | ICD-10-CM | POA: Diagnosis not present

## 2023-12-24 DIAGNOSIS — C679 Malignant neoplasm of bladder, unspecified: Secondary | ICD-10-CM | POA: Insufficient documentation

## 2023-12-24 DIAGNOSIS — R634 Abnormal weight loss: Secondary | ICD-10-CM | POA: Diagnosis not present

## 2023-12-24 DIAGNOSIS — R102 Pelvic and perineal pain: Secondary | ICD-10-CM | POA: Diagnosis not present

## 2023-12-24 DIAGNOSIS — J398 Other specified diseases of upper respiratory tract: Secondary | ICD-10-CM | POA: Diagnosis not present

## 2023-12-24 DIAGNOSIS — Z95828 Presence of other vascular implants and grafts: Secondary | ICD-10-CM

## 2023-12-24 DIAGNOSIS — Z5112 Encounter for antineoplastic immunotherapy: Secondary | ICD-10-CM | POA: Insufficient documentation

## 2023-12-24 LAB — RESPIRATORY PANEL BY PCR

## 2023-12-24 LAB — CMP (CANCER CENTER ONLY)
ALT: 11 U/L (ref 0–44)
AST: 28 U/L (ref 15–41)
Albumin: 3.8 g/dL (ref 3.5–5.0)
Alkaline Phosphatase: 46 U/L (ref 38–126)
Anion gap: 9 (ref 5–15)
BUN: 18 mg/dL (ref 8–23)
CO2: 22 mmol/L (ref 22–32)
Calcium: 8.7 mg/dL — ABNORMAL LOW (ref 8.9–10.3)
Chloride: 102 mmol/L (ref 98–111)
Creatinine: 1.26 mg/dL — ABNORMAL HIGH (ref 0.44–1.00)
GFR, Estimated: 48 mL/min — ABNORMAL LOW (ref >60)
Glucose, Bld: 156 mg/dL — ABNORMAL HIGH (ref 70–99)
Potassium: 3.2 mmol/L — ABNORMAL LOW (ref 3.5–5.1)
Sodium: 133 mmol/L — ABNORMAL LOW (ref 135–145)
Total Bilirubin: 0.6 mg/dL (ref 0.0–1.2)
Total Protein: 7.4 g/dL (ref 6.5–8.1)

## 2023-12-24 LAB — CBC WITH DIFFERENTIAL (CANCER CENTER ONLY)
Abs Immature Granulocytes: 0.02 10*3/uL (ref 0.00–0.07)
Basophils Absolute: 0 10*3/uL (ref 0.0–0.1)
Basophils Relative: 0 %
Eosinophils Absolute: 1.5 10*3/uL — ABNORMAL HIGH (ref 0.0–0.5)
Eosinophils Relative: 21 %
HCT: 33.6 % — ABNORMAL LOW (ref 36.0–46.0)
Hemoglobin: 11.2 g/dL — ABNORMAL LOW (ref 12.0–15.0)
Immature Granulocytes: 0 %
Lymphocytes Relative: 16 %
Lymphs Abs: 1.1 10*3/uL (ref 0.7–4.0)
MCH: 31.2 pg (ref 26.0–34.0)
MCHC: 33.3 g/dL (ref 30.0–36.0)
MCV: 93.6 fL (ref 80.0–100.0)
Monocytes Absolute: 0.7 10*3/uL (ref 0.1–1.0)
Monocytes Relative: 9 %
Neutro Abs: 3.9 10*3/uL (ref 1.7–7.7)
Neutrophils Relative %: 54 %
Platelet Count: 239 10*3/uL (ref 150–400)
RBC: 3.59 MIL/uL — ABNORMAL LOW (ref 3.87–5.11)
RDW: 15.3 % (ref 11.5–15.5)
WBC Count: 7.3 10*3/uL (ref 4.0–10.5)
nRBC: 0 % (ref 0.0–0.2)

## 2023-12-24 MED ORDER — HEPARIN SOD (PORK) LOCK FLUSH 100 UNIT/ML IV SOLN
500.0000 [IU] | Freq: Once | INTRAVENOUS | Status: AC
  Administered 2023-12-24: 500 [IU]
  Filled 2023-12-24: qty 5

## 2023-12-24 NOTE — Progress Notes (Signed)
 Patient is having some cold symptoms since 2 days ago, her temp is 99.5, and she isn't having a appetite, and she has lost 3 lobs since last week. She is having issues with feeling dehydrated. She isn't drinking enough water.

## 2023-12-24 NOTE — Progress Notes (Addendum)
 [facility] CONSULT NOTE  Patient Care Team: McDonough, Renard Matter as PCP - General (Physician Assistant) Michaelyn Barter, MD as Consulting Physician (Oncology)  REASON FOR REFFERAL: Muscle invasive bladder cancer  CANCER STAGING   Cancer Staging  Urothelial cancer [facility]) Staging form: Kidney, AJCC 8th Edition - Clinical: Stage III (cT3, cN0, cM0) - Signed by Michaelyn Barter, MD on 05/07/2023 Stage prefix: Initial diagnosis - Pathologic stage from 09/25/2023: Stage IV (pM1) - Signed by Michaelyn Barter, MD on 10/25/2023   ASSESSMENT & PLAN:  Barbara [name] 63 y.o. female with pmh of anxiety, GERD, right carotid artery stenosis was referred to medical oncology for new diagnosis of muscle invasive urothelial cancer.  # Metastatic urothelial carcinoma - Patient developed urinary symptoms around September or October 2023.  Was treated with multiple rounds of antibiotics with no improvement.  She was then referred to Dr. Apolinar Junes for further evaluation.  - s/p TURBT with left ureteral stent placement by Dr. Apolinar Junes.  IntraOp findings showed large irregular mucosal submucosal extensive tumor from the left dome, left lateral bladder wall majority of trigone including the left UO and bladder neck.  Measures at least 5 x 5 cm. Pathology showed invasive high-grade urothelial carcinoma. Invades muscularis propria.  - Completed neoadjuvant cycle 4-day 1 of split dose cisplatin and gemcitabine (07/23/2023). Pt declined C4D8 due to poor tolerance/nausea/flank pain.  Split dose cisplatin considered for CKD stage III.  - s/p robotic adhesiolysis + peritoneal biopsies 09/25/23 showed urothelial cancer with peritoneal carcinomatosis and small + large bowel serosal involvement. Original plan was cystectomy and urinary diversion but this was aborted based on intra-op findings.   -PET scan from 10/28/2023 showed hypermetabolic retroperitoneal and pelvic lymph nodes bilaterally concerning for nodal  metastasis.  8 mm aortocaval node SUV 4.  Hypermetabolic pelvic sidewall nodes 1.1 cm.  Left external iliac node 9 mm SUV 4.4 midline hypermetabolic activity posterior to bladder suspicious for tumor along vaginal cuff.  Mildly enlarged right paratracheal subcarinal lymph node with intermediate activity indeterminate for metastatic disease.  Left ureteral stent in place.  -Labs reviewed and acceptable for treatment.  However, patient has not been feeling well since yesterday.  Having low-grade temp of 100, sore throat, cough, congestion.  His husband was sick for the past 1 week.  Will check respiratory viral panel.  I am concerned she likely has some viral infection and will hold off on treatment which can make her more immunocompromised.  Patient advised to monitor temperature at home and if she has high-grade fever and worsening symptoms to report.  I will hold antibiotics for now.  Follow-up in 1 week.  -Repeat imaging scheduled for March 17.  -Tempus testing was sent on peritoneal biopsies - no targets. Summary as below.   # Hypokalemia -Potassium 3.2.  Declined oral and IV potassium supplements. -She will focus on eating potassium rich food.  Repeat levels next week.  # Maculopapular rash -Likely secondary to enfortumab vedotin -Grade 3 involving more than 30% BSA with severe pruritus, mild fever. -Improved with supportive care-Claritin, Aveeno moisturizer and Singulair.  # Hematuria -Chronic likely due to bladder mass.  # Moderate left hydroureteronephrosis s/p left ureteral stent -Left ureteral stent last replaced on 09/25/2023.  Continue follow-up with Dr. Berneice Heinrich.  # Acid reflux/gas/bloating-advised to take OTC simethicone, Tums, Pepcid as needed.  # Coronary atherosclerosis -Detected as incidental finding on CT done for staging purposes. Rest per primary.  # Mild COPD changes -On CT's chest.  Prior  history of smoking.  Asymptomatic.  # Access-port placed on 05/03/2023  TEMPUS  testing-     Orders Placed This Encounter  Procedures   Respiratory (~20 pathogens) panel by PCR    Standing Status:   Future    Number of Occurrences:   1    Expected Date:   12/24/2023    Expiration Date:   12/23/2024   CBC with Differential (Cancer Center Only)    Standing Status:   Future    Expected Date:   12/31/2023    Expiration Date:   12/30/2024   CMP (Cancer Center only)    Standing Status:   Future    Expected Date:   12/31/2023    Expiration Date:   12/30/2024   Ambulatory Referral to [facility] Nutrition    Referral Priority:   Routine    Referral Type:   Consultation    Referral Reason:   Specialty Services Required    Number of Visits Requested:   1   RTC in 1 weeks for MD visit, labs, cycle 3  The total time spent in the appointment was 30 minutes encounter with patients including review of chart and various tests results, discussions about plan of care and coordination of care plan   All questions were answered. The patient knows to call the clinic with any problems, questions or concerns. No barriers to learning was detected.  Michaelyn Barter, MD 3/4/20252:17 PM   HISTORY OF PRESENTING ILLNESS:  Barbara [name] 63 y.o. female with pmh of pmh of anxiety, GERD, right carotid artery stenosis was referred to medical oncology for new diagnosis of muscle invasive urothelial cancer.  Patient develops urinary symptoms around September or October 2023.  Was treated with multiple rounds of antibiotics with no improvement.  She was then referred to Dr. Apolinar Junes for further evaluation.  CTU from 03/22/2023 showed moderate left hydroureteronephrosis to the UVJ, marked leftward asymmetric wall thickening of the urinary bladder measuring 18 mm in maximal thickness which extends over the UVJ.  Perivesical stranding soft tissue.  No evidence of upper tract or abdominopelvic metastatic disease.  s/p TURBT with left ureteral stent placement by Dr. Apolinar Junes.  IntraOp findings showed large  irregular mucosal submucosal extensive tumor from the left dome, left lateral bladder wall majority of trigone including the left UO and bladder neck.  Measures at least 5 x 5 cm.  Pathology showed invasive high-grade urothelial carcinoma.  There is some discrepancy.  Lower down in the report mentions low-grade.  Invades muscularis propria.  Interval history Patient was seen today as follow-up for stage IV bladder cancer, on Enfortumab and Keytruda. However, patient has not been feeling well since yesterday.  Having low-grade temp of 100, sore throat, cough, congestion.  His husband was sick for the past 1 week.   I have reviewed her chart and materials related to her cancer extensively and collaborated history with the patient. Summary of oncologic history is as follows: Oncology History  Urothelial cancer (HCC)  04/12/2023 Initial Diagnosis   Urothelial cancer (HCC)   04/12/2023 Cancer Staging   Staging form: Kidney, AJCC 8th Edition - Clinical: Stage III (cT3, cN0, cM0) - Signed by Michaelyn Barter, MD on 05/07/2023 Stage prefix: Initial diagnosis   09/25/2023 Cancer Staging   Staging form: Kidney, AJCC 8th Edition - Pathologic stage from 09/25/2023: Stage IV (pM1) - Signed by Michaelyn Barter, MD on 10/25/2023   Malignant neoplasm of urinary bladder (HCC)  04/22/2023 Initial Diagnosis   Malignant neoplasm of  urinary bladder (HCC)   05/07/2023 - 07/23/2023 Chemotherapy   Patient is on Treatment Plan : BLADDER Gemcitabine D1,8 + Cisplatin (split dose) D1,8 q21d x 4 cycles     11/05/2023 -  Chemotherapy   Patient is on Treatment Plan : UROTHELIAL ADVANCED, METASTATIC ENFORTUMAB D1, D8 + PEMBROLIZUMAB (200) D1 Q21D       MEDICAL HISTORY:  Past Medical History:  Diagnosis Date   Adopted    Allergic rhinitis    Anemia    h/o   Anxiety    Arthritis    B12 deficiency    Bilateral carotid artery stenosis    Bilateral carotid bruits    Bladder tumor    Cancer (HCC)    bladder cancer    Depression    Gallbladder polyp    GERD (gastroesophageal reflux disease)    Heart murmur    Hydronephrosis of left kidney    Migraines    Tobacco abuse    UTI (urinary tract infection)     SURGICAL HISTORY: Past Surgical History:  Procedure Laterality Date   ABDOMINAL HYSTERECTOMY     BLADDER SURGERY     BREAST BIOPSY Left 2014   neg   CYSTOSCOPY W/ URETERAL STENT PLACEMENT Left 04/04/2023   Procedure: CYSTOSCOPY WITH RETROGRADE PYELOGRAM/URETERAL STENT PLACEMENT;  Surgeon: Vanna Scotland, MD;  Location: [facility] ORS;  Service: Urology;  Laterality: Left;   FOOT SURGERY Right    2009   IR IMAGING GUIDED PORT INSERTION  05/03/2023   LAPAROSCOPIC APPENDECTOMY  12/05/2012   NASAL SEPTUM SURGERY     ROBOT ASSISTED LAPAROSCOPIC COMPLETE CYSTECT ILEAL CONDUIT N/A 09/25/2023   Procedure: XI ROBOTIC ASSISTED LAPAROSCOPIC EXTENSIVE LYSIS OF ADHESIONS, PERITONEAL BIOPSIES;  Surgeon: Loletta Parish., MD;  Location: [facility];  Service: Urology;  Laterality: N/A;   SISTRUNK PROCEDURE  03/03/2012   Thyroglossal duct cyst excision   TONSILLECTOMY     TRANSURETHRAL RESECTION OF BLADDER TUMOR N/A 04/04/2023   Procedure: TRANSURETHRAL RESECTION OF BLADDER TUMOR (TURBT);  Surgeon: Vanna Scotland, MD;  Location: [facility] ORS;  Service: Urology;  Laterality: N/A;    SOCIAL HISTORY: Social History   Socioeconomic History   Marital status: Married    Spouse name: Not on file   Number of children: Not on file   Years of education: Not on file   Highest education level: Not on file  Occupational History   Not on file  Tobacco Use   Smoking status: Former    Current packs/day: 0.25    Average packs/day: 0.3 packs/day for 30.0 years (7.5 ttl pk-yrs)    Types: Cigarettes    Passive exposure: Past   Smokeless tobacco: Never  Vaping Use   Vaping status: Never Used  Substance and Sexual Activity   Alcohol use: Never   Drug use: Never   Sexual activity: Not on file  Other Topics Concern   Not on  file  Social History Narrative   Not on file   Social Drivers of Health   Financial Resource Strain: Not on file  Food Insecurity: No Food Insecurity (09/25/2023)   Hunger Vital Sign    Worried About Running Out of Food in the Last Year: Never true    Ran Out of Food in the Last Year: Never true  Transportation Needs: No Transportation Needs (09/25/2023)   PRAPARE - Administrator, Civil Service (Medical): No    Lack of Transportation (Non-Medical): No  Physical Activity: Not on file  Stress: Not  on file  Social Connections: Not on file  Intimate Partner Violence: Not At Risk (09/25/2023)   Humiliation, Afraid, Rape, and Kick questionnaire    Fear of Current or Ex-Partner: No    Emotionally Abused: No    Physically Abused: No    Sexually Abused: No    FAMILY HISTORY: Family History  Adopted: Yes    ALLERGIES:  is allergic to pseudoephedrine hcl.  MEDICATIONS:  Current Outpatient Medications  Medication Sig Dispense Refill   Carboxymethylcell-Glycerin PF 0.5-0.9 % SOLN Place 2 drops into both eyes 4 (four) times daily. 1 each 11   carboxymethylcellulose (REFRESH PLUS) 0.5 % SOLN Place 1 drop into both eyes 3 (three) times daily as needed (dry eyes).     carisoprodol (SOMA) 350 MG tablet Take 1 tablet (350 mg total) by mouth at bedtime as needed for muscle spasms. 90 tablet 0   dicyclomine (BENTYL) 10 MG capsule Take 1 capsule (10 mg total) by mouth 3 (three) times daily as needed for up to 30 doses for spasms. 30 capsule 0   docusate sodium (COLACE) 100 MG capsule Take 1 capsule (100 mg total) by mouth 2 (two) times daily.     escitalopram (LEXAPRO) 5 MG tablet TAKE 1 TABLET BY MOUTH DAILY 90 tablet 1   estradiol (ESTRACE) 1 MG tablet TAKE 1 TABLET BY MOUTH DAILY 90 tablet 3   lidocaine-prilocaine (EMLA) cream Apply 1 Application topically as needed. 30 g 0   montelukast (SINGULAIR) 10 MG tablet Take 1 tablet (10 mg total) by mouth at bedtime. As needed if breaks  out into rash 30 tablet 0   ondansetron (ZOFRAN) 8 MG tablet Take 1 tablet (8 mg total) by mouth every 8 (eight) hours as needed for up to 30 doses for nausea or vomiting. 30 tablet 0   oxybutynin (DITROPAN) 5 MG tablet Take 1 tablet (5 mg total) by mouth every 8 (eight) hours as needed for bladder spasms. 30 tablet 0   prochlorperazine (COMPAZINE) 10 MG tablet Take 1 tablet (10 mg total) by mouth every 6 (six) hours as needed for nausea or vomiting. 30 tablet 0   topiramate (TOPAMAX) 50 MG tablet Take 2 tablets (100 mg total) by mouth at bedtime. 180 tablet 3   traMADol (ULTRAM) 50 MG tablet Take 1 tablet (50 mg total) by mouth every 12 (twelve) hours as needed. 30 tablet 0   trimethoprim (TRIMPEX) 100 MG tablet Take 1 tablet (100 mg total) by mouth daily. 30 tablet 11   No current facility-administered medications for this visit.   Facility-Administered Medications Ordered in Other Visits  Medication Dose Route Frequency Provider Last Rate Last Admin   heparin lock flush 100 unit/mL  500 Units Intravenous Once Michaelyn Barter, MD       heparin lock flush 100 unit/mL  500 Units Intravenous Once Michaelyn Barter, MD       heparin lock flush 100 unit/mL  500 Units Intravenous Once Michaelyn Barter, MD       sodium chloride flush (NS) 0.9 % injection 10 mL  10 mL Intravenous Once Michaelyn Barter, MD       sodium chloride flush (NS) 0.9 % injection 10 mL  10 mL Intravenous Once Michaelyn Barter, MD       sodium chloride flush (NS) 0.9 % injection 10 mL  10 mL Intravenous Once Michaelyn Barter, MD        REVIEW OF SYSTEMS:   Pertinent information mentioned in HPI All other systems were reviewed  with the patient and are negative.  PHYSICAL EXAMINATION: ECOG PERFORMANCE STATUS: 1 - Symptomatic but completely ambulatory  Vitals:   12/24/23 1313  BP: 110/65  Pulse: (!) 108  Resp: 16  Temp: 99.8 F (37.7 C)  SpO2: 97%    Filed Weights   12/24/23 1313  Weight: 111 lb (50.3 kg)      GENERAL:alert, no distress and comfortable SKIN: skin color, texture, turgor are normal, no rashes or significant lesions EYES: normal, conjunctiva are pink and non-injected, sclera clear OROPHARYNX:no exudate, no erythema and lips, buccal mucosa, and tongue normal  NECK: supple, thyroid normal size, non-tender, without nodularity LYMPH:  no palpable lymphadenopathy in the cervical, axillary or inguinal LUNGS: clear to auscultation and percussion with normal breathing effort HEART: regular rate & rhythm and no murmurs and no lower extremity edema ABDOMEN:abdomen soft, non-tender and normal bowel sounds Musculoskeletal:no cyanosis of digits and no clubbing  PSYCH: alert & oriented x 3 with fluent speech NEURO: no focal motor/sensory deficits  LABORATORY DATA:  I have reviewed the data as listed Lab Results  Component Value Date   WBC 7.3 12/24/2023   HGB 11.2 (L) 12/24/2023   HCT 33.6 (L) 12/24/2023   MCV 93.6 12/24/2023   PLT 239 12/24/2023   Recent Labs    12/03/23 1411 12/17/23 1250 12/24/23 1243  NA 138 136 133*  K 3.7 3.4* 3.2*  CL 109 107 102  CO2 22 22 22   GLUCOSE 85 114* 156*  BUN 24* 22 18  CREATININE 1.29* 1.19* 1.26*  CALCIUM 8.4* 8.5* 8.7*  GFRNONAA 47* 52* 48*  PROT 6.9 7.1 7.4  ALBUMIN 3.5 3.6 3.8  AST 20 22 28   ALT 11 13 11   ALKPHOS 40 44 46  BILITOT 0.5 0.6 0.6    RADIOGRAPHIC STUDIES: I have personally reviewed the radiological images as listed and agreed with the findings in the report. No results found.

## 2023-12-24 NOTE — Patient Instructions (Signed)
 Please consider increasing your pepcid to twice a day.

## 2023-12-26 ENCOUNTER — Other Ambulatory Visit: Payer: Self-pay | Admitting: Internal Medicine

## 2023-12-31 ENCOUNTER — Other Ambulatory Visit: Payer: Self-pay

## 2023-12-31 ENCOUNTER — Inpatient Hospital Stay

## 2023-12-31 ENCOUNTER — Inpatient Hospital Stay: Admitting: Internal Medicine

## 2023-12-31 ENCOUNTER — Encounter: Payer: Self-pay | Admitting: Internal Medicine

## 2023-12-31 VITALS — BP 118/76 | HR 78 | Temp 97.6°F | Resp 14 | Wt 110.0 lb

## 2023-12-31 DIAGNOSIS — C679 Malignant neoplasm of bladder, unspecified: Secondary | ICD-10-CM | POA: Diagnosis not present

## 2023-12-31 DIAGNOSIS — Z5112 Encounter for antineoplastic immunotherapy: Secondary | ICD-10-CM

## 2023-12-31 DIAGNOSIS — R300 Dysuria: Secondary | ICD-10-CM | POA: Diagnosis not present

## 2023-12-31 DIAGNOSIS — C689 Malignant neoplasm of urinary organ, unspecified: Secondary | ICD-10-CM | POA: Diagnosis not present

## 2023-12-31 LAB — CBC WITH DIFFERENTIAL (CANCER CENTER ONLY)
Abs Immature Granulocytes: 0.01 10*3/uL (ref 0.00–0.07)
Basophils Absolute: 0 10*3/uL (ref 0.0–0.1)
Basophils Relative: 1 %
Eosinophils Absolute: 2.2 10*3/uL — ABNORMAL HIGH (ref 0.0–0.5)
Eosinophils Relative: 32 %
HCT: 31.6 % — ABNORMAL LOW (ref 36.0–46.0)
Hemoglobin: 10.2 g/dL — ABNORMAL LOW (ref 12.0–15.0)
Immature Granulocytes: 0 %
Lymphocytes Relative: 34 %
Lymphs Abs: 2.3 10*3/uL (ref 0.7–4.0)
MCH: 30.8 pg (ref 26.0–34.0)
MCHC: 32.3 g/dL (ref 30.0–36.0)
MCV: 95.5 fL (ref 80.0–100.0)
Monocytes Absolute: 0.4 10*3/uL (ref 0.1–1.0)
Monocytes Relative: 6 %
Neutro Abs: 1.8 10*3/uL (ref 1.7–7.7)
Neutrophils Relative %: 27 %
Platelet Count: 237 10*3/uL (ref 150–400)
RBC: 3.31 MIL/uL — ABNORMAL LOW (ref 3.87–5.11)
RDW: 15.1 % (ref 11.5–15.5)
Smear Review: NORMAL
WBC Count: 6.7 10*3/uL (ref 4.0–10.5)
nRBC: 0 % (ref 0.0–0.2)

## 2023-12-31 LAB — CMP (CANCER CENTER ONLY)
ALT: 13 U/L (ref 0–44)
AST: 26 U/L (ref 15–41)
Albumin: 3.2 g/dL — ABNORMAL LOW (ref 3.5–5.0)
Alkaline Phosphatase: 34 U/L — ABNORMAL LOW (ref 38–126)
Anion gap: 6 (ref 5–15)
BUN: 20 mg/dL (ref 8–23)
CO2: 24 mmol/L (ref 22–32)
Calcium: 8.1 mg/dL — ABNORMAL LOW (ref 8.9–10.3)
Chloride: 107 mmol/L (ref 98–111)
Creatinine: 1.22 mg/dL — ABNORMAL HIGH (ref 0.44–1.00)
GFR, Estimated: 50 mL/min — ABNORMAL LOW (ref >60)
Glucose, Bld: 106 mg/dL — ABNORMAL HIGH (ref 70–99)
Potassium: 3.2 mmol/L — ABNORMAL LOW (ref 3.5–5.1)
Sodium: 137 mmol/L (ref 135–145)
Total Bilirubin: 0.5 mg/dL (ref 0.0–1.2)
Total Protein: 6.6 g/dL (ref 6.5–8.1)

## 2023-12-31 LAB — URINALYSIS, COMPLETE (UACMP) WITH MICROSCOPIC
Bilirubin Urine: NEGATIVE
Glucose, UA: NEGATIVE mg/dL
Ketones, ur: NEGATIVE mg/dL
Nitrite: POSITIVE — AB
Protein, ur: >=300 mg/dL — AB
RBC / HPF: >50 RBC/hpf (ref 0–5)
Specific Gravity, Urine: 1.017 (ref 1.005–1.030)
Squamous Epithelial / HPF: 0 /HPF (ref 0–5)
WBC, UA: >50 WBC/hpf (ref 0–5)
pH: 6 (ref 5.0–8.0)

## 2023-12-31 MED ORDER — MONTELUKAST SODIUM 10 MG PO TABS: 10.0000 mg | ORAL_TABLET | Freq: Every day | ORAL | 1 refills | Status: DC

## 2023-12-31 MED ORDER — SODIUM CHLORIDE 0.9 % IV SOLN
1.0000 mg/kg | Freq: Once | INTRAVENOUS | Status: AC
  Administered 2023-12-31: 50 mg via INTRAVENOUS
  Filled 2023-12-31: qty 3

## 2023-12-31 MED ORDER — HEPARIN SOD (PORK) LOCK FLUSH 100 UNIT/ML IV SOLN
500.0000 [IU] | Freq: Once | INTRAVENOUS | Status: AC | PRN
  Administered 2023-12-31: 500 [IU]
  Filled 2023-12-31: qty 5

## 2023-12-31 MED ORDER — PROCHLORPERAZINE MALEATE 10 MG PO TABS: 10.0000 mg | ORAL_TABLET | Freq: Four times a day (QID) | ORAL | 0 refills | Status: DC | PRN

## 2023-12-31 MED ORDER — SODIUM CHLORIDE 0.9 % IV SOLN
INTRAVENOUS | Status: DC
Start: 2023-12-31 — End: 2023-12-31
  Filled 2023-12-31: qty 250

## 2023-12-31 MED ORDER — PROCHLORPERAZINE MALEATE 10 MG PO TABS
10.0000 mg | ORAL_TABLET | Freq: Once | ORAL | Status: AC
Start: 2023-12-31 — End: 2023-12-31
  Administered 2023-12-31: 10 mg via ORAL
  Filled 2023-12-31: qty 1

## 2023-12-31 NOTE — Patient Instructions (Signed)
 [facility] - A DEPT OF [facility]  Discharge Instructions: Thank you for choosing [facility] to provide your oncology and hematology care.  If you have a lab appointment with the Cancer Center, please go directly to the Cancer Center and check in at the registration area.  Wear comfortable clothing and clothing appropriate for easy access to any Portacath or PICC line.   We strive to give you quality time with your provider. You may need to reschedule your appointment if you arrive late (15 or more minutes).  Arriving late affects you and other patients whose appointments are after yours.  Also, if you miss three or more appointments without notifying the office, you may be dismissed from the clinic at the provider's discretion.      For prescription refill requests, have your pharmacy contact our office and allow 72 hours for refills to be completed.    Today you received the following chemotherapy and/or immunotherapy agents Padcev      To help prevent nausea and vomiting after your treatment, we encourage you to take your nausea medication as directed.  BELOW ARE SYMPTOMS THAT SHOULD BE REPORTED IMMEDIATELY: *FEVER GREATER THAN 100.4 F (38 C) OR HIGHER *CHILLS OR SWEATING *NAUSEA AND VOMITING THAT IS NOT CONTROLLED WITH YOUR NAUSEA MEDICATION *UNUSUAL SHORTNESS OF BREATH *UNUSUAL BRUISING OR BLEEDING *URINARY PROBLEMS (pain or burning when urinating, or frequent urination) *BOWEL PROBLEMS (unusual diarrhea, constipation, pain near the anus) TENDERNESS IN MOUTH AND THROAT WITH OR WITHOUT PRESENCE OF ULCERS (sore throat, sores in mouth, or a toothache) UNUSUAL RASH, SWELLING OR PAIN  UNUSUAL VAGINAL DISCHARGE OR ITCHING   Items with * indicate a potential emergency and should be followed up as soon as possible or go to the Emergency Department if any problems should occur.  Please show the CHEMOTHERAPY ALERT CARD or IMMUNOTHERAPY ALERT  CARD at check-in to the Emergency Department and triage nurse.  Should you have questions after your visit or need to cancel or reschedule your appointment, please contact [facility] - A DEPT OF [facility]  [phone] and follow the prompts.  Office hours are 8:00 a.m. to 4:30 p.m. Monday - Friday. Please note that voicemails left after 4:00 p.m. may not be returned until the following business day.  We are closed weekends and major holidays. You have access to a nurse at all times for urgent questions. Please call the main number to the clinic [phone] and follow the prompts.  For any non-urgent questions, you may also contact your provider using MyChart. We now offer e-Visits for anyone 78 and older to request care online for non-urgent symptoms. For details visit [email or web address].   Also download the MyChart app! Go to the app store, search "MyChart", open the app, select [facility], and log in with your MyChart username and password.

## 2023-12-31 NOTE — Progress Notes (Signed)
 [facility] CONSULT NOTE  Patient Care Team: McDonough, Renard Matter as PCP - General (Physician Assistant) Michaelyn Barter, MD as Consulting Physician (Oncology)  REASON FOR REFFERAL: Muscle invasive bladder cancer  CANCER STAGING   Cancer Staging  Urothelial cancer [facility]) Staging form: Kidney, AJCC 8th Edition - Clinical: Stage III (cT3, cN0, cM0) - Signed by Michaelyn Barter, MD on 05/07/2023 Stage prefix: Initial diagnosis - Pathologic stage from 09/25/2023: Stage IV (pM1) - Signed by Michaelyn Barter, MD on 10/25/2023   ASSESSMENT & PLAN:  Barbara [name] 63 y.o. female with pmh of anxiety, GERD, right carotid artery stenosis was referred to medical oncology for new diagnosis of muscle invasive urothelial cancer.  # Metastatic urothelial carcinoma - Patient developed urinary symptoms around September or October 2023.  Was treated with multiple rounds of antibiotics with no improvement.  She was then referred to Dr. Apolinar Junes for further evaluation.  - s/p TURBT with left ureteral stent placement by Dr. Apolinar Junes.  IntraOp findings showed large irregular mucosal submucosal extensive tumor from the left dome, left lateral bladder wall majority of trigone including the left UO and bladder neck.  Measures at least 5 x 5 cm. Pathology showed invasive high-grade urothelial carcinoma. Invades muscularis propria.  - Completed neoadjuvant cycle 4-day 1 of split dose cisplatin and gemcitabine (07/23/2023). Pt declined C4D8 due to poor tolerance/nausea/flank pain.  Split dose cisplatin considered for CKD stage III.  - s/p robotic adhesiolysis + peritoneal biopsies 09/25/23 showed urothelial cancer with peritoneal carcinomatosis and small + large bowel serosal involvement. Original plan was cystectomy and urinary diversion but this was aborted based on intra-op findings.   -PET scan from 10/28/2023 showed hypermetabolic retroperitoneal and pelvic lymph nodes bilaterally concerning for nodal  metastasis.  8 mm aortocaval node SUV 4.  Hypermetabolic pelvic sidewall nodes 1.1 cm.  Left external iliac node 9 mm SUV 4.4 midline hypermetabolic activity posterior to bladder suspicious for tumor along vaginal cuff.  Mildly enlarged right paratracheal subcarinal lymph node with intermediate activity indeterminate for metastatic disease.  Left ureteral stent in place.  -Labs reviewed and acceptable for treatment.  Will proceed with cycle 3-day 8 of enfortumab vedotin 1 mg/ kg.  -Repeat imaging scheduled for March 17.  -Tempus testing was sent on peritoneal biopsies - no targets. Summary as below.   # Pelvic pain with urination -Collect UA with culture.  # Hypokalemia -Potassium 3.2.  Declined oral and IV potassium supplements. -She will focus on eating potassium rich food.  Continue monitoring.  # Maculopapular rash -Likely secondary to enfortumab vedotin -Grade 3 involving more than 30% BSA with severe pruritus, mild fever. -Improved with supportive care-Claritin, Aveeno moisturizer and Singulair.  # Hematuria -Chronic likely due to bladder mass.  # Moderate left hydroureteronephrosis s/p left ureteral stent -Left ureteral stent last replaced on 09/25/2023.  Continue follow-up with Dr. Berneice Heinrich.  # Acid reflux/gas/bloating-advised to take OTC simethicone, Tums, Pepcid as needed.  # Coronary atherosclerosis -Detected as incidental finding on CT done for staging purposes. Rest per primary.  # Mild COPD changes -On CT's chest.  Prior history of smoking.  Asymptomatic.  # Access-port placed on 05/03/2023  TEMPUS testing-     Orders Placed This Encounter  Procedures   Urine Culture    Standing Status:   Future    Number of Occurrences:   1    Expected Date:   12/31/2023    Expiration Date:   12/30/2024   Urinalysis, Complete w Microscopic  Standing Status:   Future    Number of Occurrences:   1    Expected Date:   12/31/2023    Expiration Date:   12/30/2024   RTC in 2  weeks for MD visit, labs, cycle 4  The total time spent in the appointment was 30 minutes encounter with patients including review of chart and various tests results, discussions about plan of care and coordination of care plan   All questions were answered. The patient knows to call the clinic with any problems, questions or concerns. No barriers to learning was detected.  Michaelyn Barter, MD 3/11/202512:51 PM   HISTORY OF PRESENTING ILLNESS:  Barbara [name] 63 y.o. female with pmh of pmh of anxiety, GERD, right carotid artery stenosis was referred to medical oncology for new diagnosis of muscle invasive urothelial cancer.  Patient develops urinary symptoms around September or October 2023.  Was treated with multiple rounds of antibiotics with no improvement.  She was then referred to Dr. Apolinar Junes for further evaluation.  CTU from 03/22/2023 showed moderate left hydroureteronephrosis to the UVJ, marked leftward asymmetric wall thickening of the urinary bladder measuring 18 mm in maximal thickness which extends over the UVJ.  Perivesical stranding soft tissue.  No evidence of upper tract or abdominopelvic metastatic disease.  s/p TURBT with left ureteral stent placement by Dr. Apolinar Junes.  IntraOp findings showed large irregular mucosal submucosal extensive tumor from the left dome, left lateral bladder wall majority of trigone including the left UO and bladder neck.  Measures at least 5 x 5 cm.  Pathology showed invasive high-grade urothelial carcinoma.  There is some discrepancy.  Lower down in the report mentions low-grade.  Invades muscularis propria.  Interval history Patient was seen today as follow-up for stage IV bladder cancer, on Enfortumab and Keytruda. Last week, patient was very sick with cough, spiking fever to 102.  Last temperature was on Wednesday.  Her husband was diagnosed with COVID infection and likely she encountered the same.  She still has residual congestion and cough.   Does report some pelvic pain ongoing for a week while urination.  I have reviewed her chart and materials related to her cancer extensively and collaborated history with the patient. Summary of oncologic history is as follows: Oncology History  Urothelial cancer (HCC)  04/12/2023 Initial Diagnosis   Urothelial cancer (HCC)   04/12/2023 Cancer Staging   Staging form: Kidney, AJCC 8th Edition - Clinical: Stage III (cT3, cN0, cM0) - Signed by Michaelyn Barter, MD on 05/07/2023 Stage prefix: Initial diagnosis   09/25/2023 Cancer Staging   Staging form: Kidney, AJCC 8th Edition - Pathologic stage from 09/25/2023: Stage IV (pM1) - Signed by Michaelyn Barter, MD on 10/25/2023   Malignant neoplasm of urinary bladder (HCC)  04/22/2023 Initial Diagnosis   Malignant neoplasm of urinary bladder (HCC)   05/07/2023 - 07/23/2023 Chemotherapy   Patient is on Treatment Plan : BLADDER Gemcitabine D1,8 + Cisplatin (split dose) D1,8 q21d x 4 cycles     11/05/2023 -  Chemotherapy   Patient is on Treatment Plan : UROTHELIAL ADVANCED, METASTATIC ENFORTUMAB D1, D8 + PEMBROLIZUMAB (200) D1 Q21D       MEDICAL HISTORY:  Past Medical History:  Diagnosis Date   Adopted    Allergic rhinitis    Anemia    h/o   Anxiety    Arthritis    B12 deficiency    Bilateral carotid artery stenosis    Bilateral carotid bruits    Bladder tumor  Cancer (HCC)    bladder cancer   Depression    Gallbladder polyp    GERD (gastroesophageal reflux disease)    Heart murmur    Hydronephrosis of left kidney    Migraines    Tobacco abuse    UTI (urinary tract infection)     SURGICAL HISTORY: Past Surgical History:  Procedure Laterality Date   ABDOMINAL HYSTERECTOMY     BLADDER SURGERY     BREAST BIOPSY Left 2014   neg   CYSTOSCOPY W/ URETERAL STENT PLACEMENT Left 04/04/2023   Procedure: CYSTOSCOPY WITH RETROGRADE PYELOGRAM/URETERAL STENT PLACEMENT;  Surgeon: Vanna Scotland, MD;  Location: [facility] ORS;  Service: Urology;   Laterality: Left;   FOOT SURGERY Right    2009   IR IMAGING GUIDED PORT INSERTION  05/03/2023   LAPAROSCOPIC APPENDECTOMY  12/05/2012   NASAL SEPTUM SURGERY     ROBOT ASSISTED LAPAROSCOPIC COMPLETE CYSTECT ILEAL CONDUIT N/A 09/25/2023   Procedure: XI ROBOTIC ASSISTED LAPAROSCOPIC EXTENSIVE LYSIS OF ADHESIONS, PERITONEAL BIOPSIES;  Surgeon: Loletta Parish., MD;  Location: [facility];  Service: Urology;  Laterality: N/A;   SISTRUNK PROCEDURE  03/03/2012   Thyroglossal duct cyst excision   TONSILLECTOMY     TRANSURETHRAL RESECTION OF BLADDER TUMOR N/A 04/04/2023   Procedure: TRANSURETHRAL RESECTION OF BLADDER TUMOR (TURBT);  Surgeon: Vanna Scotland, MD;  Location: [facility] OR[facility];  Service: Urology;  Laterality: N/A;    SOCIAL HISTORY: Social History   Socioeconomic History   Marital status: Married    Spouse name: Not on file   Number of children: Not on file   Years of education: Not on file   Highest education level: Not on file  Occupational History   Not on file  Tobacco Use   Smoking status: Former    Current packs/day: 0.25    Average packs/day: 0.3 packs/day for 30.0 years (7.5 ttl pk-yrs)    Types: Cigarettes    Passive exposure: Past   Smokeless tobacco: Never  Vaping Use   Vaping status: Never Used  Substance and Sexual Activity   Alcohol use: Never   Drug use: Never   Sexual activity: Not on file  Other Topics Concern   Not on file  Social History Narrative   Not on file   Social Drivers of Health   Financial Resource Strain: Not on file  Food Insecurity: No Food Insecurity (09/25/2023)   Hunger Vital Sign    Worried About Running Out of Food in the Last Year: Never true    Ran Out of Food in the Last Year: Never true  Transportation Needs: No Transportation Needs (09/25/2023)   PRAPARE - Administrator, Civil Service (Medical): No    Lack of Transportation (Non-Medical): No  Physical Activity: Not on file  Stress: Not on file  Social Connections:  Not on file  Intimate Partner Violence: Not At Risk (09/25/2023)   Humiliation, Afraid, Rape, and Kick questionnaire    Fear of Current or Ex-Partner: No    Emotionally Abused: No    Physically Abused: No    Sexually Abused: No    FAMILY HISTORY: Family History  Adopted: Yes    ALLERGIES:  is allergic to pseudoephedrine hcl.  MEDICATIONS:  Current Outpatient Medications  Medication Sig Dispense Refill   Carboxymethylcell-Glycerin PF 0.5-0.9 % SOLN Place 2 drops into both eyes 4 (four) times daily. 1 each 11   carboxymethylcellulose (REFRESH PLUS) 0.5 % SOLN Place 1 drop into both eyes 3 (three) times daily  as needed (dry eyes).     carisoprodol (SOMA) 350 MG tablet Take 1 tablet (350 mg total) by mouth at bedtime as needed for muscle spasms. 90 tablet 0   dicyclomine (BENTYL) 10 MG capsule Take 1 capsule (10 mg total) by mouth 3 (three) times daily as needed for up to 30 doses for spasms. 30 capsule 0   docusate sodium (COLACE) 100 MG capsule Take 1 capsule (100 mg total) by mouth 2 (two) times daily.     escitalopram (LEXAPRO) 5 MG tablet TAKE 1 TABLET BY MOUTH DAILY 90 tablet 1   estradiol (ESTRACE) 1 MG tablet TAKE 1 TABLET BY MOUTH DAILY 90 tablet 3   lidocaine-prilocaine (EMLA) cream Apply 1 Application topically as needed. 30 g 0   montelukast (SINGULAIR) 10 MG tablet Take 1 tablet (10 mg total) by mouth at bedtime. As needed if breaks out into rash 90 tablet 1   ondansetron (ZOFRAN) 8 MG tablet Take 1 tablet (8 mg total) by mouth every 8 (eight) hours as needed for up to 30 doses for nausea or vomiting. 30 tablet 0   oxybutynin (DITROPAN) 5 MG tablet Take 1 tablet (5 mg total) by mouth every 8 (eight) hours as needed for bladder spasms. 30 tablet 0   prochlorperazine (COMPAZINE) 10 MG tablet Take 1 tablet (10 mg total) by mouth every 6 (six) hours as needed for nausea or vomiting. 30 tablet 0   topiramate (TOPAMAX) 50 MG tablet Take 2 tablets (100 mg total) by mouth at bedtime.  180 tablet 3   traMADol (ULTRAM) 50 MG tablet Take 1 tablet (50 mg total) by mouth every 12 (twelve) hours as needed. 30 tablet 0   trimethoprim (TRIMPEX) 100 MG tablet Take 1 tablet (100 mg total) by mouth daily. 30 tablet 11   No current facility-administered medications for this visit.   Facility-Administered Medications Ordered in Other Visits  Medication Dose Route Frequency Provider Last Rate Last Admin   0.9 %  sodium chloride infusion   Intravenous Continuous Michaelyn Barter, MD 10 mL/hr at 12/31/23 1211 New Bag at 12/31/23 1211   enfortumab vedotin-ejfv (PADCEV) 50 mg in sodium chloride 0.9 % 50 mL (0.9091 mg/mL) chemo infusion  1 mg/kg (Treatment Plan Recorded) Intravenous Once Michaelyn Barter, MD 110 mL/hr at 12/31/23 1232 50 mg at 12/31/23 1232   heparin lock flush 100 unit/mL  500 Units Intravenous Once Michaelyn Barter, MD       heparin lock flush 100 unit/mL  500 Units Intravenous Once Michaelyn Barter, MD       heparin lock flush 100 unit/mL  500 Units Intravenous Once Michaelyn Barter, MD       [COMPLETED] heparin lock flush 100 unit/mL  500 Units Intracatheter Once PRN Michaelyn Barter, MD   500 Units at 12/31/23 1330   sodium chloride flush (NS) 0.9 % injection 10 mL  10 mL Intravenous Once Michaelyn Barter, MD       sodium chloride flush (NS) 0.9 % injection 10 mL  10 mL Intravenous Once Michaelyn Barter, MD       sodium chloride flush (NS) 0.9 % injection 10 mL  10 mL Intravenous Once Michaelyn Barter, MD        REVIEW OF SYSTEMS:   Pertinent information mentioned in HPI All other systems were reviewed with the patient and are negative.  PHYSICAL EXAMINATION: ECOG PERFORMANCE STATUS: 1 - Symptomatic but completely ambulatory  Vitals:   12/31/23 1127  BP: 118/76  Pulse: 78  Resp:  14  Temp: 97.6 F (36.4 C)  SpO2: 97%    Filed Weights   12/31/23 1127  Weight: 110 lb (49.9 kg)     GENERAL:alert, no distress and comfortable SKIN: skin color, texture, turgor are  normal, no rashes or significant lesions EYES: normal, conjunctiva are pink and non-injected, sclera clear OROPHARYNX:no exudate, no erythema and lips, buccal mucosa, and tongue normal  NECK: supple, thyroid normal size, non-tender, without nodularity LYMPH:  no palpable lymphadenopathy in the cervical, axillary or inguinal LUNGS: clear to auscultation and percussion with normal breathing effort HEART: regular rate & rhythm and no murmurs and no lower extremity edema ABDOMEN:abdomen soft, non-tender and normal bowel sounds Musculoskeletal:no cyanosis of digits and no clubbing  PSYCH: alert & oriented x 3 with fluent speech NEURO: no focal motor/sensory deficits  LABORATORY DATA:  I have reviewed the data as listed Lab Results  Component Value Date   WBC 6.7 12/31/2023   HGB 10.2 (L) 12/31/2023   HCT 31.6 (L) 12/31/2023   MCV 95.5 12/31/2023   PLT 237 12/31/2023   Recent Labs    12/17/23 1250 12/24/23 1243 12/31/23 1058  NA 136 133* 137  K 3.4* 3.2* 3.2*  CL 107 102 107  CO2 22 22 24   GLUCOSE 114* 156* 106*  BUN 22 18 20   CREATININE 1.19* 1.26* 1.22*  CALCIUM 8.5* 8.7* 8.1*  GFRNONAA 52* 48* 50*  PROT 7.1 7.4 6.6  ALBUMIN 3.6 3.8 3.2*  AST 22 28 26   ALT 13 11 13   ALKPHOS 44 46 34*  BILITOT 0.6 0.6 0.5    RADIOGRAPHIC STUDIES: I have personally reviewed the radiological images as listed and agreed with the findings in the report. No results found.

## 2023-12-31 NOTE — Progress Notes (Signed)
 Nutrition Assessment   Reason for Assessment:   Poor appetite   ASSESSMENT:  63 year old female with high grade urothelial carcinoma invades muscularis propria.  Past medical history of GERD, right carotid artery stenosis, s/p TURBT, COPD, vit b 12 deficiency.  Received neoadjuvant cisplatin and gemcitabine.  Currently on enfortumab and [place].   Met with patient during infusion. Reports that her appetite is decreased due to taste alterations.  Reports that foods have an "off" taste.  Says she can't remember what she ate yesterday.  Eating because she knows she has too.  Likes fruit, salad with chicken.     Medications: bentyl, colace, compazine, zofran   Labs: K 3.2, glucose 106, creatinine 1.22, calcium 8.1, albumin 3.2   Anthropometrics:   Height: 65 inches Weight: 110 lb  120 lb on 10/21/23 BMI: 18  8% weight loss in the last 3 months, significant   Estimated Energy Needs  Kcals: 1250-1500 Protein: 63-75 g Fluid: 1250-1500   NUTRITION DIAGNOSIS: Inadequate oral intake related to cancer related treatment side effects (taste alterations) as evidenced by 8% weight loss and decreased intake   INTERVENTION:  Discussed strategies for taste alterations.  Handout provided Discussed protein powders with patient Contact information provided   MONITORING, EVALUATION, GOAL: weight trends, intake   Next Visit: as needed with treatment  Nysir Fergusson B. Freida Busman, RD, LDN Registered Dietitian [phone]

## 2023-12-31 NOTE — Progress Notes (Signed)
 Ongoing she has been having painful urination, which has got worse recently. She still has little to know appetite due to the taste mostly. She needs a refill on her compazine and her montelukast, which I told her I would go ahead and do as long as Dr. Alena Bills approves.

## 2024-01-01 ENCOUNTER — Other Ambulatory Visit: Payer: Self-pay | Admitting: Physician Assistant

## 2024-01-01 DIAGNOSIS — M150 Primary generalized (osteo)arthritis: Secondary | ICD-10-CM

## 2024-01-02 LAB — URINE CULTURE: Culture: >=100000 — AB

## 2024-01-03 MED ORDER — NITROFURANTOIN MONOHYD MACRO 100 MG PO CAPS: 100.0000 mg | ORAL_CAPSULE | Freq: Two times a day (BID) | ORAL | 0 refills | Status: AC

## 2024-01-03 NOTE — Addendum Note (Signed)
 Addended byMichaelyn Barter on: 01/03/2024 03:06 PM   Modules accepted: Orders

## 2024-01-06 ENCOUNTER — Ambulatory Visit: Payer: Managed Care, Other (non HMO)

## 2024-01-06 ENCOUNTER — Ambulatory Visit
Admission: RE | Admit: 2024-01-06 | Discharge: 2024-01-06 | Disposition: A | Discharge status: Home / Self Care | Payer: Managed Care, Other (non HMO) | Source: Ambulatory Visit | Attending: Internal Medicine | Admitting: Internal Medicine

## 2024-01-06 DIAGNOSIS — C679 Malignant neoplasm of bladder, unspecified: Secondary | ICD-10-CM | POA: Insufficient documentation

## 2024-01-06 MED ORDER — IOHEXOL 300 MG/ML  SOLN
100.0000 mL | Freq: Once | INTRAMUSCULAR | Status: AC | PRN
  Administered 2024-01-06: 100 mL via INTRAVENOUS

## 2024-01-10 ENCOUNTER — Encounter: Payer: Self-pay | Admitting: Internal Medicine

## 2024-01-10 ENCOUNTER — Telehealth: Payer: Self-pay | Admitting: *Deleted

## 2024-01-10 NOTE — Telephone Encounter (Signed)
 Patient sent mychart msg "I was looking in my chart for what time my appt is on Tuesday and I see that I do not have any on Tuesday. Was this an over site? I want to keep my treatment day as Tuesdays, so hopefully we can still get me in on the 25th. Please let me know."  I called patient and apologized for delays in care. Unfortunately, there was an oversight and the treatment plan did not get signed. I explained to patient that there is no chair availability on Tuesday next week. We can get her in on Thursday next week. I called patient and she stated that she just got over covid symptoms a few weeks ago. Now her husband has flu a and he is being treated by pcp. She currently has a cough. I advised her to take a home flu/covid test as well. She will go get one of these. She will continue to isolate from her husband. Pt prefers that Dr. Mervyn Skeeters move her next tx date to April 1. She is ok with seeing Leotis Shames, NP or another provider covering for Dr. Mervyn Skeeters- in Dr. Roberts Gaudy absence.  Dr. Mervyn Skeeters notified and Treatment plan will be updated and patient will see new apts on mychart

## 2024-01-10 NOTE — Telephone Encounter (Signed)
 Contacted patient via Clinical cytogeneticist

## 2024-01-10 NOTE — Addendum Note (Signed)
 Addended byMichaelyn Barter on: 01/10/2024 03:09 PM   Modules accepted: Orders

## 2024-01-13 ENCOUNTER — Encounter: Payer: Self-pay | Admitting: Internal Medicine

## 2024-01-14 ENCOUNTER — Other Ambulatory Visit: Payer: Self-pay | Admitting: Internal Medicine

## 2024-01-17 ENCOUNTER — Other Ambulatory Visit: Payer: Self-pay | Admitting: Urology

## 2024-01-20 ENCOUNTER — Encounter: Payer: Self-pay | Admitting: Internal Medicine

## 2024-01-20 ENCOUNTER — Telehealth: Payer: Self-pay

## 2024-01-20 NOTE — Telephone Encounter (Signed)
 Paperwork complete pending Dr. Mervyn Skeeters signature when she returns to the office.

## 2024-01-20 NOTE — Telephone Encounter (Signed)
 [place] paperwork for disability benefits received.

## 2024-01-21 ENCOUNTER — Inpatient Hospital Stay: Admitting: Nurse Practitioner

## 2024-01-21 ENCOUNTER — Encounter: Payer: Self-pay | Admitting: Internal Medicine

## 2024-01-21 ENCOUNTER — Inpatient Hospital Stay: Attending: Internal Medicine

## 2024-01-21 ENCOUNTER — Inpatient Hospital Stay

## 2024-01-21 ENCOUNTER — Telehealth: Payer: Self-pay

## 2024-01-21 VITALS — BP 120/75 | HR 62 | Temp 96.5°F | Resp 16 | Wt 112.2 lb

## 2024-01-21 VITALS — BP 113/62 | HR 64

## 2024-01-21 DIAGNOSIS — Z5112 Encounter for antineoplastic immunotherapy: Secondary | ICD-10-CM | POA: Diagnosis not present

## 2024-01-21 DIAGNOSIS — C689 Malignant neoplasm of urinary organ, unspecified: Secondary | ICD-10-CM | POA: Diagnosis not present

## 2024-01-21 DIAGNOSIS — C679 Malignant neoplasm of bladder, unspecified: Secondary | ICD-10-CM

## 2024-01-21 DIAGNOSIS — Z79899 Other long term (current) drug therapy: Secondary | ICD-10-CM | POA: Insufficient documentation

## 2024-01-21 DIAGNOSIS — Z5111 Encounter for antineoplastic chemotherapy: Secondary | ICD-10-CM

## 2024-01-21 LAB — CBC WITH DIFFERENTIAL (CANCER CENTER ONLY)
Abs Immature Granulocytes: 0.02 10*3/uL (ref 0.00–0.07)
Basophils Absolute: 0 10*3/uL (ref 0.0–0.1)
Basophils Relative: 1 %
Eosinophils Absolute: 0.7 10*3/uL — ABNORMAL HIGH (ref 0.0–0.5)
Eosinophils Relative: 13 %
HCT: 32.1 % — ABNORMAL LOW (ref 36.0–46.0)
Hemoglobin: 10.4 g/dL — ABNORMAL LOW (ref 12.0–15.0)
Immature Granulocytes: 0 %
Lymphocytes Relative: 48 %
Lymphs Abs: 2.4 10*3/uL (ref 0.7–4.0)
MCH: 31.1 pg (ref 26.0–34.0)
MCHC: 32.4 g/dL (ref 30.0–36.0)
MCV: 96.1 fL (ref 80.0–100.0)
Monocytes Absolute: 0.5 10*3/uL (ref 0.1–1.0)
Monocytes Relative: 10 %
Neutro Abs: 1.4 10*3/uL — ABNORMAL LOW (ref 1.7–7.7)
Neutrophils Relative %: 28 %
Platelet Count: 245 10*3/uL (ref 150–400)
RBC: 3.34 MIL/uL — ABNORMAL LOW (ref 3.87–5.11)
RDW: 15.7 % — ABNORMAL HIGH (ref 11.5–15.5)
WBC Count: 5 10*3/uL (ref 4.0–10.5)
nRBC: 0 % (ref 0.0–0.2)

## 2024-01-21 LAB — CMP (CANCER CENTER ONLY)
ALT: 13 U/L (ref 0–44)
AST: 25 U/L (ref 15–41)
Albumin: 3.4 g/dL — ABNORMAL LOW (ref 3.5–5.0)
Alkaline Phosphatase: 43 U/L (ref 38–126)
Anion gap: 5 (ref 5–15)
BUN: 22 mg/dL (ref 8–23)
CO2: 23 mmol/L (ref 22–32)
Calcium: 8.2 mg/dL — ABNORMAL LOW (ref 8.9–10.3)
Chloride: 107 mmol/L (ref 98–111)
Creatinine: 1.05 mg/dL — ABNORMAL HIGH (ref 0.44–1.00)
GFR, Estimated: >60 mL/min (ref >60)
Glucose, Bld: 92 mg/dL (ref 70–99)
Potassium: 3.6 mmol/L (ref 3.5–5.1)
Sodium: 135 mmol/L (ref 135–145)
Total Bilirubin: 0.5 mg/dL (ref 0.0–1.2)
Total Protein: 7 g/dL (ref 6.5–8.1)

## 2024-01-21 MED ORDER — PROCHLORPERAZINE MALEATE 10 MG PO TABS
10.0000 mg | ORAL_TABLET | Freq: Once | ORAL | Status: AC
  Administered 2024-01-21: 10 mg via ORAL
  Filled 2024-01-21: qty 1

## 2024-01-21 MED ORDER — HEPARIN SOD (PORK) LOCK FLUSH 100 UNIT/ML IV SOLN
500.0000 [IU] | Freq: Once | INTRAVENOUS | Status: AC | PRN
  Administered 2024-01-21: 500 [IU]
  Filled 2024-01-21: qty 5

## 2024-01-21 MED ORDER — SODIUM CHLORIDE 0.9 % IV SOLN
1.0000 mg/kg | Freq: Once | INTRAVENOUS | Status: AC
  Administered 2024-01-21: 50 mg via INTRAVENOUS
  Filled 2024-01-21: qty 3

## 2024-01-21 MED ORDER — SODIUM CHLORIDE 0.9 % IV SOLN
INTRAVENOUS | Status: DC
  Filled 2024-01-21 (×2): qty 250

## 2024-01-21 MED ORDER — SODIUM CHLORIDE 0.9% FLUSH
10.0000 mL | Freq: Once | INTRAVENOUS | Status: AC
  Administered 2024-01-21: 10 mL via INTRAVENOUS
  Filled 2024-01-21: qty 10

## 2024-01-21 MED ORDER — SODIUM CHLORIDE 0.9 % IV SOLN
200.0000 mg | Freq: Once | INTRAVENOUS | Status: AC
  Administered 2024-01-21: 200 mg via INTRAVENOUS
  Filled 2024-01-21: qty 8

## 2024-01-21 NOTE — Patient Instructions (Signed)

## 2024-01-21 NOTE — Progress Notes (Signed)
 Nutrition Follow-up:  Patient with high grade urothelial carcinoma invades muscularis propria.  Patient receiving enfortumab and [place].  Met with patient during infusion.  Reports that her appetite is better because taste is better due to not having treatment for 3 weeks.  Has been able to eat stir fry, hamburger, BLT, string cheese, cottage cheese, fruits, avocado and TV dinners.  Patient has not tried protein powders.    Medications: reviewed  Labs: reviewed  Anthropometrics:   Weight 112 lb 3.2 oz increased 110 lb on 3/11 120 lb on 10/21/23  NUTRITION DIAGNOSIS: Inadequate oral intake improved    INTERVENTION:  Maximize intake when appetite is good and taste is improved Continue small frequent meals that include protein rich foods Patient to reach out to RD if needed in the future     NEXT VISIT: no follow-up Patient to contact RD if needed  Barbara [name] B. Elease Hashimoto, CSO, LDN Registered Dietitian [phone]

## 2024-01-21 NOTE — Progress Notes (Unsigned)
 [facility] CONSULT NOTE  Patient Care Team: McDonough, Renard Matter as PCP - General (Physician Assistant) Michaelyn Barter, MD as Consulting Physician (Oncology)  REASON FOR REFFERAL: Muscle invasive bladder cancer  CANCER STAGING   Cancer Staging  Urothelial cancer [name]) Staging form: Kidney, AJCC 8th Edition - Clinical: Stage III (cT3, cN0, cM0) - Signed by Michaelyn Barter, MD on 05/07/2023 Stage prefix: Initial diagnosis - Pathologic stage from 09/25/2023: Stage IV (pM1) - Signed by Michaelyn Barter, MD on 10/25/2023  ASSESSMENT & PLAN:  Barbara [name] 63 y.o. female with pmh of anxiety, GERD, right carotid artery stenosis was referred to medical oncology for new diagnosis of muscle invasive urothelial cancer.  # Metastatic urothelial carcinoma - Patient developed urinary symptoms around September or October 2023.  Was treated with multiple rounds of antibiotics with no improvement.  She was then referred to Dr. Apolinar Junes for further evaluation.  - s/p TURBT with left ureteral stent placement by Dr. Apolinar Junes.  IntraOp findings showed large irregular mucosal submucosal extensive tumor from the left dome, left lateral bladder wall majority of trigone including the left UO and bladder neck.  Measures at least 5 x 5 cm. Pathology showed invasive high-grade urothelial carcinoma. Invades muscularis propria.  - Completed neoadjuvant cycle 4-day 1 of split dose cisplatin and gemcitabine (07/23/2023). Pt declined C4D8 due to poor tolerance/nausea/flank pain.  Split dose cisplatin considered for CKD stage III.  - s/p robotic adhesiolysis + peritoneal biopsies 09/25/23 showed urothelial cancer with peritoneal carcinomatosis and small + large bowel serosal involvement. Original plan was cystectomy and urinary diversion but this was aborted based on intra-op findings.   - PET scan from 10/28/2023 showed hypermetabolic retroperitoneal and pelvic lymph nodes bilaterally concerning for nodal  metastasis.  8 mm aortocaval node SUV 4.  Hypermetabolic pelvic sidewall nodes 1.1 cm.  Left external iliac node 9 mm SUV 4.4 midline hypermetabolic activity posterior to bladder suspicious for tumor along vaginal cuff.  Mildly enlarged right paratracheal subcarinal lymph node with intermediate activity indeterminate for metastatic disease.  Left ureteral stent in place.  - Tempus testing was sent on peritoneal biopsies - no targets.  - Reviewed CT C/A/P from 01/06/24 independently and with patient. I also provided her with printed copies of her reports. Disease appears stable to smaller without evidence of new disease. We discussed that imaging may underestimate disease and I suspect that may be the case here particularly since she had disease at time of surgery. However, she appears to be responding to treatment. We will plan to discuss her scans at case conference later this week as well.   - Labs today reviewed and acceptable for treatment. Proceed with C4D1 of enfortumab vedotin-keytruda. She will rtc in 1 week for D8C4 of enfortumab vedotin with Dr Alena Bills.   # Pelvic pain with urination -Collect UA with culture. On 12/31/23 which showed > 100,000 colonies of E.Coli - treated with nitrofurantoin 100 mg BID - symptoms resolved.   # Hypokalemia -Potassium 3.2.  Declined oral and IV potassium supplements. -She will focus on eating potassium rich food.   - Today, K 3.6. Improved. Continue increased K rich food intake.   # Maculopapular rash -Likely secondary to enfortumab vedotin -Grade 3 involving more than 30% BSA with severe pruritus, mild fever. -Improved with supportive care-Claritin, Aveeno moisturizer and Singulair.  # Hematuria -Chronic likely due to bladder mass.  # Moderate left hydroureteronephrosis s/p left ureteral stent -Left ureteral stent last replaced on 09/25/2023.  Continue  follow-up with Dr. Berneice Heinrich.  # Acid reflux/gas/bloating -advised to take OTC simethicone, Tums,  Pepcid as needed.  # Coronary atherosclerosis - Detected as incidental finding on CT done for staging purposes. Rest per primary.  # Mild COPD changes -On CT's chest.  Prior history of smoking.  Asymptomatic.  # Access-port placed on 05/03/2023  TEMPUS testing-     No orders of the defined types were placed in this encounter.  The total time spent in the appointment was 40 minutes encounter with patients including review of chart and various tests results, imaging, discussions about plan of care and coordination of care plan   All questions were answered. The patient knows to call the clinic with any problems, questions or concerns. No barriers to learning was detected.  Alinda Dooms, NP 01/21/2024   HISTORY OF PRESENTING ILLNESS:  Barbara [name] 63 y.o. female with pmh of pmh of anxiety, GERD, right carotid artery stenosis was referred to medical oncology for new diagnosis of muscle invasive urothelial cancer.  Patient develops urinary symptoms around September or October 2023.  Was treated with multiple rounds of antibiotics with no improvement.  She was then referred to Dr. Apolinar Junes for further evaluation.  CTU from 03/22/2023 showed moderate left hydroureteronephrosis to the UVJ, marked leftward asymmetric wall thickening of the urinary bladder measuring 18 mm in maximal thickness which extends over the UVJ.  Perivesical stranding soft tissue.  No evidence of upper tract or abdominopelvic metastatic disease.  s/p TURBT with left ureteral stent placement by Dr. Apolinar Junes.  IntraOp findings showed large irregular mucosal submucosal extensive tumor from the left dome, left lateral bladder wall majority of trigone including the left UO and bladder neck.  Measures at least 5 x 5 cm.  Pathology showed invasive high-grade urothelial carcinoma.  There is some discrepancy.  Lower down in the report mentions low-grade.  Invades muscularis propria.  Interval history Patient was seen today  as follow-up for stage IV bladder cancer, on Enfortumab and Keytruda. Cough and UTI have now resolved. She's worried about scan results. Tolerating treatment well otherwise and denies complaints.   Summary of oncologic history is as follows: Oncology History  Urothelial cancer (HCC)  04/12/2023 Initial Diagnosis   Urothelial cancer (HCC)   04/12/2023 Cancer Staging   Staging form: Kidney, AJCC 8th Edition - Clinical: Stage III (cT3, cN0, cM0) - Signed by Michaelyn Barter, MD on 05/07/2023 Stage prefix: Initial diagnosis   09/25/2023 Cancer Staging   Staging form: Kidney, AJCC 8th Edition - Pathologic stage from 09/25/2023: Stage IV (pM1) - Signed by Michaelyn Barter, MD on 10/25/2023   Malignant neoplasm of urinary bladder (HCC)  04/22/2023 Initial Diagnosis   Malignant neoplasm of urinary bladder (HCC)   05/07/2023 - 07/23/2023 Chemotherapy   Patient is on Treatment Plan : BLADDER Gemcitabine D1,8 + Cisplatin (split dose) D1,8 q21d x 4 cycles     11/05/2023 -  Chemotherapy   Patient is on Treatment Plan : UROTHELIAL ADVANCED, METASTATIC ENFORTUMAB D1, D8 + PEMBROLIZUMAB (200) D1 Q21D       MEDICAL HISTORY:  Past Medical History:  Diagnosis Date   Adopted    Allergic rhinitis    Anemia    h/o   Anxiety    Arthritis    B12 deficiency    Bilateral carotid artery stenosis    Bilateral carotid bruits    Bladder tumor    Cancer (HCC)    bladder cancer   Depression    Gallbladder polyp  GERD (gastroesophageal reflux disease)    Heart murmur    Hydronephrosis of left kidney    Migraines    Tobacco abuse    UTI (urinary tract infection)     SURGICAL HISTORY: Past Surgical History:  Procedure Laterality Date   ABDOMINAL HYSTERECTOMY     BLADDER SURGERY     BREAST BIOPSY Left 2014   neg   CYSTOSCOPY W/ URETERAL STENT PLACEMENT Left 04/04/2023   Procedure: CYSTOSCOPY WITH RETROGRADE PYELOGRAM/URETERAL STENT PLACEMENT;  Surgeon: Vanna Scotland, MD;  Location: [facility] ORS;  Service:  Urology;  Laterality: Left;   FOOT SURGERY Right    2009   IR IMAGING GUIDED PORT INSERTION  05/03/2023   LAPAROSCOPIC APPENDECTOMY  12/05/2012   NASAL SEPTUM SURGERY     ROBOT ASSISTED LAPAROSCOPIC COMPLETE CYSTECT ILEAL CONDUIT N/A 09/25/2023   Procedure: XI ROBOTIC ASSISTED LAPAROSCOPIC EXTENSIVE LYSIS OF ADHESIONS, PERITONEAL BIOPSIES;  Surgeon: Loletta Parish., MD;  Location: [facility];  Service: Urology;  Laterality: N/A;   SISTRUNK PROCEDURE  03/03/2012   Thyroglossal duct cyst excision   TONSILLECTOMY     TRANSURETHRAL RESECTION OF BLADDER TUMOR N/A 04/04/2023   Procedure: TRANSURETHRAL RESECTION OF BLADDER TUMOR (TURBT);  Surgeon: Vanna Scotland, MD;  Location: [facility] ORS;  Service: Urology;  Laterality: N/A;    SOCIAL HISTORY: Social History   Socioeconomic History   Marital status: Married    Spouse name: Not on file   Number of children: Not on file   Years of education: Not on file   Highest education level: Not on file  Occupational History   Not on file  Tobacco Use   Smoking status: Former    Current packs/day: 0.25    Average packs/day: 0.3 packs/day for 30.0 years (7.5 ttl pk-yrs)    Types: Cigarettes    Passive exposure: Past   Smokeless tobacco: Never  Vaping Use   Vaping status: Never Used  Substance and Sexual Activity   Alcohol use: Never   Drug use: Never   Sexual activity: Not on file  Other Topics Concern   Not on file  Social History Narrative   Not on file   Social Drivers of Health   Financial Resource Strain: Not on file  Food Insecurity: No Food Insecurity (09/25/2023)   Hunger Vital Sign    Worried About Running Out of Food in the Last Year: Never true    Ran Out of Food in the Last Year: Never true  Transportation Needs: No Transportation Needs (09/25/2023)   PRAPARE - Administrator, Civil Service (Medical): No    Lack of Transportation (Non-Medical): No  Physical Activity: Not on file  Stress: Not on file  Social  Connections: Not on file  Intimate Partner Violence: Not At Risk (09/25/2023)   Humiliation, Afraid, Rape, and Kick questionnaire    Fear of Current or Ex-Partner: No    Emotionally Abused: No    Physically Abused: No    Sexually Abused: No    FAMILY HISTORY: Family History  Adopted: Yes    ALLERGIES:  is allergic to pseudoephedrine hcl.  MEDICATIONS:  Current Outpatient Medications  Medication Sig Dispense Refill   carboxymethylcellulose (REFRESH PLUS) 0.5 % SOLN Place 1 drop into both eyes 3 (three) times daily as needed (dry eyes).     carisoprodol (SOMA) 350 MG tablet Take 1 tablet (350 mg total) by mouth at bedtime as needed for muscle spasms. 90 tablet 0   docusate sodium (COLACE) 100 MG  capsule Take 1 capsule (100 mg total) by mouth 2 (two) times daily.     escitalopram (LEXAPRO) 5 MG tablet TAKE 1 TABLET BY MOUTH DAILY 90 tablet 1   estradiol (ESTRACE) 1 MG tablet TAKE 1 TABLET BY MOUTH DAILY 90 tablet 3   lidocaine-prilocaine (EMLA) cream Apply 1 Application topically as needed. 30 g 0   montelukast (SINGULAIR) 10 MG tablet Take 1 tablet (10 mg total) by mouth at bedtime. As needed if breaks out into rash 90 tablet 1   ondansetron (ZOFRAN) 8 MG tablet Take 1 tablet (8 mg total) by mouth every 8 (eight) hours as needed for up to 30 doses for nausea or vomiting. 30 tablet 0   oxybutynin (DITROPAN) 5 MG tablet Take 1 tablet (5 mg total) by mouth every 8 (eight) hours as needed for bladder spasms. 30 tablet 0   prochlorperazine (COMPAZINE) 10 MG tablet Take 1 tablet (10 mg total) by mouth every 6 (six) hours as needed for nausea or vomiting. 30 tablet 0   topiramate (TOPAMAX) 50 MG tablet Take 2 tablets (100 mg total) by mouth at bedtime. 180 tablet 3   traMADol (ULTRAM) 50 MG tablet Take 1 tablet (50 mg total) by mouth every 12 (twelve) hours as needed. 30 tablet 0   Carboxymethylcell-Glycerin PF 0.5-0.9 % SOLN Place 2 drops into both eyes 4 (four) times daily. (Patient not  taking: Reported on 01/21/2024) 1 each 11   dicyclomine (BENTYL) 10 MG capsule Take 1 capsule (10 mg total) by mouth 3 (three) times daily as needed for up to 30 doses for spasms. (Patient not taking: Reported on 01/21/2024) 30 capsule 0   trimethoprim (TRIMPEX) 100 MG tablet Take 1 tablet (100 mg total) by mouth daily. 30 tablet 11   No current facility-administered medications for this visit.   Facility-Administered Medications Ordered in Other Visits  Medication Dose Route Frequency Provider Last Rate Last Admin   heparin lock flush 100 unit/mL  500 Units Intravenous Once Michaelyn Barter, MD       heparin lock flush 100 unit/mL  500 Units Intravenous Once Michaelyn Barter, MD       heparin lock flush 100 unit/mL  500 Units Intravenous Once Michaelyn Barter, MD       sodium chloride flush (NS) 0.9 % injection 10 mL  10 mL Intravenous Once Michaelyn Barter, MD       sodium chloride flush (NS) 0.9 % injection 10 mL  10 mL Intravenous Once Michaelyn Barter, MD       sodium chloride flush (NS) 0.9 % injection 10 mL  10 mL Intravenous Once Michaelyn Barter, MD        REVIEW OF SYSTEMS:   Review of Systems  Constitutional:  Positive for malaise/fatigue. Negative for chills, fever and weight loss.  HENT:  Negative for hearing loss, nosebleeds, sore throat and tinnitus.   Eyes:  Negative for blurred vision and double vision.  Respiratory:  Negative for cough, hemoptysis, shortness of breath and wheezing.   Cardiovascular:  Negative for chest pain, palpitations and leg swelling.  Gastrointestinal:  Negative for abdominal pain, blood in stool, constipation, diarrhea, melena, nausea and vomiting.  Genitourinary:  Negative for dysuria and urgency.  Musculoskeletal:  Negative for back pain, falls, joint pain and myalgias.  Skin:  Negative for itching and rash.  Neurological:  Negative for dizziness, tingling, sensory change, loss of consciousness, weakness and headaches.  Endo/Heme/Allergies:  Negative for  environmental allergies. Does not bruise/bleed easily.  Psychiatric/Behavioral:  Negative for depression. The patient is not nervous/anxious and does not have insomnia.    PHYSICAL EXAMINATION: ECOG PERFORMANCE STATUS: 1 - Symptomatic but completely ambulatory  Vitals:   01/21/24 1106  BP: 120/75  Pulse: 62  Resp: 16  Temp: (!) 96.5 F (35.8 C)  SpO2: 100%    Filed Weights   01/21/24 1106  Weight: 112 lb 3.2 oz (50.9 kg)   General: Well-developed, well-nourished, no acute distress. Eyes: Pink conjunctiva, anicteric sclera. Lungs: Clear to auscultation bilaterally.  No audible wheezing or coughing Heart: Regular rate and rhythm.  Abdomen: Soft, nontender, nondistended.  Musculoskeletal: No edema, cyanosis, or clubbing. Neuro: Alert, answering all questions appropriately. Cranial nerves grossly intact. Skin: No rashes or petechiae noted. Psych: Normal affect.   LABORATORY DATA:  I have reviewed the data as listed Lab Results  Component Value Date   WBC 5.0 01/21/2024   HGB 10.4 (L) 01/21/2024   HCT 32.1 (L) 01/21/2024   MCV 96.1 01/21/2024   PLT 245 01/21/2024   Recent Labs    12/24/23 1243 12/31/23 1058 01/21/24 1058  NA 133* 137 135  K 3.2* 3.2* 3.6  CL 102 107 107  CO2 22 24 23   GLUCOSE 156* 106* 92  BUN 18 20 22   CREATININE 1.26* 1.22* 1.05*  CALCIUM 8.7* 8.1* 8.2*  GFRNONAA 48* 50* >60  PROT 7.4 6.6 7.0  ALBUMIN 3.8 3.2* 3.4*  AST 28 26 25   ALT 11 13 13   ALKPHOS 46 34* 43  BILITOT 0.6 0.5 0.5    RADIOGRAPHIC STUDIES: I have personally reviewed the radiological images as listed and agreed with the findings in the report. CT CHEST ABDOMEN PELVIS W CONTRAST Result Date: 01/14/2024 CLINICAL DATA:  History of bladder cancer, assess treatment response, EXAM: CT CHEST, ABDOMEN, AND PELVIS WITH CONTRAST TECHNIQUE: Multidetector CT imaging of the chest, abdomen and pelvis was performed following the standard protocol during bolus administration of  intravenous contrast. RADIATION DOSE REDUCTION: This exam was performed according to the departmental dose-optimization program which includes automated exposure control, adjustment of the mA and/or kV according to patient size and/or use of iterative reconstruction technique. CONTRAST:  OMNIPAQUE IOHEXOL 300 MG/ML  SOLN COMPARISON:  CT abdomen and pelvis June 09, 2023, CT chest April 22, 2023 FINDINGS: CT CHEST FINDINGS Cardiovascular: No significant vascular findings. Normal heart size. No pericardial effusion. Moderate to advanced coronary artery calcifications. No pericardial effusions. Mediastinum/Nodes: No enlarged mediastinal, hilar, or axillary lymph nodes. Thyroid gland, trachea, and esophagus demonstrate no significant findings. Lungs/Pleura: Lungs are clear. No pleural effusion or pneumothorax. No obvious pulmonary nodules. Musculoskeletal: No chest wall mass or suspicious bone lesions identified. CT ABDOMEN PELVIS FINDINGS Hepatobiliary: No focal liver abnormality is seen. No gallstones, gallbladder wall thickening, or biliary dilatation. Suggestion of 1 cm gallstone image 67. Correlate with ultrasound as clinically needed. Pancreas: Unremarkable. No pancreatic ductal dilatation or surrounding inflammatory changes. Spleen: Normal in size without focal abnormality. Adrenals/Urinary Tract: Adrenal glands are unremarkable. Kidneys are normal, without renal calculi, focal lesion, or hydronephrosis. Bladder is unremarkable. Double-J left internal ureteral stent remains in place distal end in the bladder the proximal end in the left renal pelvis. No significant hydronephrosis. Stomach/Bowel: Stomach is within normal limits. Appendix appears normal. No evidence of bowel wall thickening, distention, or inflammatory changes. Moderate amount of residual fecal material throughout the transverse colon and descending colon. Vascular/Lymphatic: Aortic atherosclerosis. No enlarged abdominal or pelvic lymph nodes.  Reproductive: Status post hysterectomy. No adnexal masses. Other: No  abdominal wall hernia or abnormality. No abdominopelvic ascites. Musculoskeletal: No fracture is seen. The visualized portions of the bladder are grossly unremarkable however, no delayed images with IV contrast were obtained to better identified bladder. No obvious bladder masses are identified on these images. No pelvic adenopathy. IMPRESSION: *No evidence of metastatic disease in the chest, abdomen or pelvis. *Double-J left internal ureteral stent remains in place. No significant hydronephrosis. *Suggestion of 1 cm gallstone. Correlate with ultrasound as clinically needed. *Moderate amount of residual fecal material throughout the transverse colon and descending colon. *Aortic atherosclerosis. Electronically Signed   By: Shaaron Adler M.D.   On: 01/14/2024 08:59

## 2024-01-21 NOTE — Telephone Encounter (Signed)
 Patient was here today to get her chemo infusion treatment, while she was here to also see our nurse practionier Consuello Masse, Sheena our nurse came to find me, Mrs. Toro wanted to see me about some forms for her to be able to continue to get her disability from her job. She sent [place] the paper work to fill out and sign. Come to find out Feliberto Harts had already taken care of this paper work. We are just waiting for Dr. Alena Bills to come back on Thursday so she can sign it and we can fax the papers to the assigned fax number.

## 2024-01-23 ENCOUNTER — Encounter: Payer: Self-pay | Admitting: Nurse Practitioner

## 2024-01-23 ENCOUNTER — Inpatient Hospital Stay

## 2024-01-23 ENCOUNTER — Encounter: Payer: Self-pay | Admitting: Internal Medicine

## 2024-01-23 NOTE — Telephone Encounter (Signed)
Completed paperwork faxed 

## 2024-01-24 ENCOUNTER — Telehealth: Payer: Self-pay

## 2024-01-24 ENCOUNTER — Encounter: Payer: Self-pay | Admitting: Internal Medicine

## 2024-01-24 ENCOUNTER — Encounter: Payer: Self-pay | Admitting: Nurse Practitioner

## 2024-01-24 NOTE — Telephone Encounter (Signed)
 Additional information request form received from [employer] regarding disability claim.  Paper complete, pending Dr. Mervyn Skeeters signature.

## 2024-01-24 NOTE — Progress Notes (Addendum)
 COVID Vaccine Completed: yes  Date of COVID positive in last 90 days: yes in March  per pt  PCP - Lynn Ito, PA Cardiologist - n/a Oncologist- Michaelyn Barter, MD  Chest x-ray - CT 01/06/24 Epic EKG - n/a Stress Test - n/a ECHO -  n/a Cardiac Cath - n/a Pacemaker/ICD device last checked: n/a Spinal Cord Stimulator: n/a  Bowel Prep - no  Sleep Study - n/a CPAP -   Fasting Blood Sugar - n/a Checks Blood Sugar _____ times a day  Last dose of GLP1 agonist-  N/A GLP1 instructions:  Hold 7 days before surgery    Last dose of SGLT-2 inhibitors-  N/A SGLT-2 instructions:  Hold 3 days before surgery    Blood Thinner Instructions:  Last dose: n/a Time: Aspirin Instructions: Last Dose:  Activity level: Can go up a flight of stairs and perform activities of daily living without stopping and without symptoms of chest pain or shortness of breath.   Anesthesia review: heart murmur, anemia   Patient denies shortness of breath, fever, cough and chest pain at PAT appointment  Patient verbalized understanding of instructions that were given to them at the PAT appointment. Patient was also instructed that they will need to review over the PAT instructions again at home before surgery.

## 2024-01-24 NOTE — Patient Instructions (Addendum)
 SURGICAL WAITING ROOM VISITATION  Patients having surgery or a procedure may have no more than 2 support people in the waiting area - these visitors may rotate.    Children under the age of 53 must have an adult with them who is not the patient.  Due to an increase in RSV and influenza rates and associated hospitalizations, children ages 19 and under may not visit patients in [facility] hospitals.  Visitors with respiratory illnesses are discouraged from visiting and should remain at home.  If the patient needs to stay at the hospital during part of their recovery, the visitor guidelines for inpatient rooms apply. Pre-op nurse will coordinate an appropriate time for 1 support person to accompany patient in pre-op.  This support person may not rotate.    Please refer to the [facility] website for the visitor guidelines for Inpatients (after your surgery is over and you are in a regular room).    Your procedure is scheduled on: 01/29/24   Report to [facility] Main Entrance    Report to admitting at 1:15 PM   Call this number if you have problems the morning of surgery [phone]   Do not eat food or drink liquids :After Midnight.          If you have questions, please contact your surgeon's office.   FOLLOW BOWEL PREP AND ANY ADDITIONAL PRE OP INSTRUCTIONS YOU RECEIVED FROM YOUR SURGEON'S OFFICE!!!     Oral Hygiene is also important to reduce your risk of infection.                                    Remember - BRUSH YOUR TEETH THE MORNING OF SURGERY WITH YOUR REGULAR TOOTHPASTE  DENTURES WILL BE REMOVED PRIOR TO SURGERY PLEASE DO NOT APPLY "Poly grip" OR ADHESIVES!!!   Stop all vitamins and herbal supplements 7 days before surgery.   Take these medicines the morning of surgery with A SIP OF WATER: Tylenol, Lexapro, Zofran, Oxybutynin, Tramadol, Trimpex                               You may not have any metal on your body including hair pins, jewelry, and body  piercing             Do not wear make-up, lotions, powders, perfumes, or deodorant  Do not wear nail polish including gel and S&S, artificial/acrylic nails, or any other type of covering on natural nails including finger and toenails. If you have artificial nails, gel coating, etc. that needs to be removed by a nail salon please have this removed prior to surgery or surgery may need to be canceled/ delayed if the surgeon/ anesthesia feels like they are unable to be safely monitored.   Do not shave  48 hours prior to surgery.    Do not bring valuables to the hospital. [facility] IS NOT             RESPONSIBLE   FOR VALUABLES.   Contacts, glasses, dentures or bridgework may not be worn into surgery.  DO NOT BRING YOUR HOME MEDICATIONS TO THE HOSPITAL. PHARMACY WILL DISPENSE MEDICATIONS LISTED ON YOUR MEDICATION LIST TO YOU DURING YOUR ADMISSION IN THE HOSPITAL!    Patients discharged on the day of surgery will not be allowed to drive home.  Someone NEEDS to stay  with you for the first 24 hours after anesthesia.   Special Instructions: Bring a copy of your healthcare power of attorney and living will documents the day of surgery if you haven't scanned them before.              Please read over the following fact sheets you were given: IF YOU HAVE QUESTIONS ABOUT YOUR PRE-OP INSTRUCTIONS PLEASE CALL [phone] [facility]ras    If you received a COVID test during your pre-op visit  it is requested that you wear a mask when out in public, stay away from anyone that may not be feeling well and notify your surgeon if you develop symptoms. If you test positive for Covid or have been in contact with anyone that has tested positive in the last 10 days please notify you surgeon.    [facility] - Preparing for Surgery Before surgery, you can play an important role.  Because skin is not sterile, your skin needs to be as free of germs as possible.  You can reduce the number of germs on your skin by washing  with CHG (chlorahexidine gluconate) soap before surgery.  CHG is an antiseptic cleaner which kills germs and bonds with the skin to continue killing germs even after washing. Please DO NOT use if you have an allergy to CHG or antibacterial soaps.  If your skin becomes reddened/irritated stop using the CHG and inform your nurse when you arrive at Short Stay. Do not shave (including legs and underarms) for at least 48 hours prior to the first CHG shower.  You may shave your face/neck.  Please follow these instructions carefully:  1.  Shower with CHG Soap the night before surgery and the  morning of surgery.  2.  If you choose to wash your hair, wash your hair first as usual with your normal  shampoo.  3.  After you shampoo, rinse your hair and body thoroughly to remove the shampoo.                             4.  Use CHG as you would any other liquid soap.  You can apply chg directly to the skin and wash.  Gently with a scrungie or clean washcloth.  5.  Apply the CHG Soap to your body ONLY FROM THE NECK DOWN.   Do   not use on face/ open                           Wound or open sores. Avoid contact with eyes, ears mouth and   genitals (private parts).                       Wash face,  Genitals (private parts) with your normal soap.             6.  Wash thoroughly, paying special attention to the area where your    surgery  will be performed.  7.  Thoroughly rinse your body with warm water from the neck down.  8.  DO NOT shower/wash with your normal soap after using and rinsing off the CHG Soap.                9.  Pat yourself dry with a clean towel.            10.  Wear clean pajamas.  11.  Place clean sheets on your bed the night of your first shower and do not  sleep with pets. Day of Surgery : Do not apply any lotions/deodorants the morning of surgery.  Please wear clean clothes to the hospital/surgery center.  FAILURE TO FOLLOW THESE INSTRUCTIONS MAY RESULT IN THE CANCELLATION OF YOUR  SURGERY  PATIENT SIGNATURE_________________________________  NURSE SIGNATURE__________________________________  ________________________________________________________________________

## 2024-01-27 ENCOUNTER — Other Ambulatory Visit: Payer: Self-pay

## 2024-01-27 ENCOUNTER — Encounter (HOSPITAL_COMMUNITY): Payer: Self-pay

## 2024-01-27 ENCOUNTER — Encounter: Payer: Self-pay | Admitting: Nurse Practitioner

## 2024-01-27 ENCOUNTER — Encounter: Payer: Self-pay | Admitting: Internal Medicine

## 2024-01-27 ENCOUNTER — Encounter (HOSPITAL_COMMUNITY)
Admission: RE | Admit: 2024-01-27 | Discharge: 2024-01-27 | Disposition: A | Discharge status: Home / Self Care | Source: Ambulatory Visit | Attending: Urology | Admitting: Urology

## 2024-01-27 DIAGNOSIS — Z01818 Encounter for other preprocedural examination: Secondary | ICD-10-CM | POA: Diagnosis present

## 2024-01-27 NOTE — Telephone Encounter (Signed)
 Paper work has been faxed

## 2024-01-27 NOTE — Addendum Note (Signed)
 Addended byMichaelyn Barter on: 01/27/2024 03:13 PM   Modules accepted: Orders

## 2024-01-28 ENCOUNTER — Inpatient Hospital Stay: Admitting: Internal Medicine

## 2024-01-28 ENCOUNTER — Inpatient Hospital Stay

## 2024-01-29 ENCOUNTER — Encounter: Payer: Self-pay | Admitting: Internal Medicine

## 2024-01-29 ENCOUNTER — Ambulatory Visit (HOSPITAL_BASED_OUTPATIENT_CLINIC_OR_DEPARTMENT_OTHER): Admitting: Anesthesiology

## 2024-01-29 ENCOUNTER — Encounter (HOSPITAL_COMMUNITY): Payer: Self-pay | Admitting: Urology

## 2024-01-29 ENCOUNTER — Ambulatory Visit (HOSPITAL_COMMUNITY)
Admission: RE | Admit: 2024-01-29 | Discharge: 2024-01-29 | Disposition: A | Discharge status: Home / Self Care | Source: Ambulatory Visit | Attending: Urology | Admitting: Urology

## 2024-01-29 ENCOUNTER — Ambulatory Visit (HOSPITAL_COMMUNITY)

## 2024-01-29 ENCOUNTER — Ambulatory Visit (HOSPITAL_COMMUNITY): Payer: Self-pay | Admitting: Physician Assistant

## 2024-01-29 ENCOUNTER — Other Ambulatory Visit: Payer: Self-pay

## 2024-01-29 ENCOUNTER — Encounter (HOSPITAL_COMMUNITY)
Admission: RE | Disposition: A | Discharge status: Home / Self Care | Payer: Self-pay | Source: Ambulatory Visit | Attending: Urology

## 2024-01-29 DIAGNOSIS — N1330 Unspecified hydronephrosis: Secondary | ICD-10-CM | POA: Diagnosis not present

## 2024-01-29 DIAGNOSIS — C679 Malignant neoplasm of bladder, unspecified: Secondary | ICD-10-CM | POA: Diagnosis not present

## 2024-01-29 DIAGNOSIS — K219 Gastro-esophageal reflux disease without esophagitis: Secondary | ICD-10-CM | POA: Insufficient documentation

## 2024-01-29 DIAGNOSIS — C482 Malignant neoplasm of peritoneum, unspecified: Secondary | ICD-10-CM | POA: Diagnosis not present

## 2024-01-29 DIAGNOSIS — N1339 Other hydronephrosis: Secondary | ICD-10-CM | POA: Diagnosis not present

## 2024-01-29 DIAGNOSIS — Z87891 Personal history of nicotine dependence: Secondary | ICD-10-CM | POA: Insufficient documentation

## 2024-01-29 DIAGNOSIS — C786 Secondary malignant neoplasm of retroperitoneum and peritoneum: Secondary | ICD-10-CM | POA: Insufficient documentation

## 2024-01-29 HISTORY — PX: CYSTOSCOPY W/ RETROGRADES: SHX1426

## 2024-01-29 HISTORY — PX: CYSTOSCOPY WITH STENT PLACEMENT: SHX5790

## 2024-01-29 SURGERY — CYSTOSCOPY, WITH STENT INSERTION
Anesthesia: General | Site: Pelvis | Laterality: Left

## 2024-01-29 MED ORDER — PROPOFOL 10 MG/ML IV BOLUS
INTRAVENOUS | Status: AC
  Filled 2024-01-29: qty 20

## 2024-01-29 MED ORDER — DROPERIDOL 2.5 MG/ML IJ SOLN: 0.6250 mg | Freq: Once | INTRAMUSCULAR | Status: DC | PRN

## 2024-01-29 MED ORDER — ACETAMINOPHEN 500 MG PO TABS
1000.0000 mg | ORAL_TABLET | Freq: Once | ORAL | Status: AC
  Administered 2024-01-29: 1000 mg via ORAL
  Filled 2024-01-29: qty 2

## 2024-01-29 MED ORDER — FENTANYL CITRATE (PF) 100 MCG/2ML IJ SOLN
INTRAMUSCULAR | Status: DC | PRN
Start: 2024-01-29 — End: 2024-01-29
  Administered 2024-01-29 (×2): 50 ug via INTRAVENOUS

## 2024-01-29 MED ORDER — DEXAMETHASONE SODIUM PHOSPHATE 10 MG/ML IJ SOLN
INTRAMUSCULAR | Status: DC | PRN
Start: 2024-01-29 — End: 2024-01-29
  Administered 2024-01-29: 8 mg via INTRAVENOUS

## 2024-01-29 MED ORDER — MIDAZOLAM HCL 5 MG/5ML IJ SOLN
INTRAMUSCULAR | Status: DC | PRN
  Administered 2024-01-29: 2 mg via INTRAVENOUS

## 2024-01-29 MED ORDER — LIDOCAINE HCL (CARDIAC) PF 100 MG/5ML IV SOSY
PREFILLED_SYRINGE | INTRAVENOUS | Status: DC | PRN
  Administered 2024-01-29: 50 mg via INTRAVENOUS

## 2024-01-29 MED ORDER — PROPOFOL 10 MG/ML IV BOLUS
INTRAVENOUS | Status: DC | PRN
  Administered 2024-01-29: 90 mg via INTRAVENOUS

## 2024-01-29 MED ORDER — LIDOCAINE HCL (PF) 2 % IJ SOLN
INTRAMUSCULAR | Status: AC
  Filled 2024-01-29: qty 5

## 2024-01-29 MED ORDER — ONDANSETRON HCL 4 MG/2ML IJ SOLN
INTRAMUSCULAR | Status: DC | PRN
  Administered 2024-01-29: 4 mg via INTRAVENOUS

## 2024-01-29 MED ORDER — SODIUM CHLORIDE 0.9 % IR SOLN
Status: DC | PRN
  Administered 2024-01-29: 3000 mL

## 2024-01-29 MED ORDER — IOHEXOL 300 MG/ML  SOLN
INTRAMUSCULAR | Status: DC | PRN
  Administered 2024-01-29: 20 mL

## 2024-01-29 MED ORDER — LACTATED RINGERS IV SOLN: INTRAVENOUS | Status: DC | PRN

## 2024-01-29 MED ORDER — DEXAMETHASONE SODIUM PHOSPHATE 10 MG/ML IJ SOLN
INTRAMUSCULAR | Status: AC
  Filled 2024-01-29: qty 1

## 2024-01-29 MED ORDER — OXYCODONE HCL 5 MG PO TABS: 5.0000 mg | ORAL_TABLET | Freq: Once | ORAL | Status: DC | PRN

## 2024-01-29 MED ORDER — CHLORHEXIDINE GLUCONATE 0.12 % MT SOLN
15.0000 mL | Freq: Once | OROMUCOSAL | Status: AC
  Administered 2024-01-29: 15 mL via OROMUCOSAL

## 2024-01-29 MED ORDER — PHENYLEPHRINE 80 MCG/ML (10ML) SYRINGE FOR IV PUSH (FOR BLOOD PRESSURE SUPPORT)
PREFILLED_SYRINGE | INTRAVENOUS | Status: AC
  Filled 2024-01-29: qty 10

## 2024-01-29 MED ORDER — ONDANSETRON HCL 4 MG/2ML IJ SOLN
INTRAMUSCULAR | Status: AC
  Filled 2024-01-29: qty 2

## 2024-01-29 MED ORDER — OXYCODONE HCL 5 MG/5ML PO SOLN: 5.0000 mg | Freq: Once | ORAL | Status: DC | PRN

## 2024-01-29 MED ORDER — FENTANYL CITRATE (PF) 100 MCG/2ML IJ SOLN
INTRAMUSCULAR | Status: AC
  Filled 2024-01-29: qty 2

## 2024-01-29 MED ORDER — GENTAMICIN SULFATE 40 MG/ML IJ SOLN
5.0000 mg/kg | INTRAVENOUS | Status: AC
  Administered 2024-01-29: 12475.2 mg via INTRAVENOUS
  Filled 2024-01-29: qty 6.25

## 2024-01-29 MED ORDER — TRAMADOL HCL 50 MG PO TABS: 50.0000 mg | ORAL_TABLET | Freq: Four times a day (QID) | ORAL | 0 refills | Status: DC | PRN

## 2024-01-29 MED ORDER — ORAL CARE MOUTH RINSE: 15.0000 mL | Freq: Once | OROMUCOSAL | Status: AC

## 2024-01-29 MED ORDER — MIDAZOLAM HCL 2 MG/2ML IJ SOLN
INTRAMUSCULAR | Status: AC
  Filled 2024-01-29: qty 2

## 2024-01-29 MED ORDER — FENTANYL CITRATE PF 50 MCG/ML IJ SOSY: 25.0000 ug | PREFILLED_SYRINGE | INTRAMUSCULAR | Status: DC | PRN

## 2024-01-29 MED ORDER — LACTATED RINGERS IV SOLN: INTRAVENOUS | Status: DC

## 2024-01-29 SURGICAL SUPPLY — 15 items
BAG URO CATCHER STRL LF (MISCELLANEOUS) ×2 IMPLANT
BASKET ZERO TIP NITINOL 2.4FR (BASKET) IMPLANT
CATH URETL OPEN END 6FR 70 (CATHETERS) IMPLANT
CLOTH BEACON ORANGE TIMEOUT ST (SAFETY) ×2 IMPLANT
GLOVE SURG LX STRL 7.5 STRW (GLOVE) ×2 IMPLANT
GOWN STRL REUS W/ TWL XL LVL3 (GOWN DISPOSABLE) ×2 IMPLANT
GUIDEWIRE ANG ZIPWIRE 038X150 (WIRE) ×2 IMPLANT
GUIDEWIRE STR DUAL SENSOR (WIRE) ×2 IMPLANT
KIT TURNOVER KIT A (KITS) IMPLANT
MANIFOLD NEPTUNE II (INSTRUMENTS) ×2 IMPLANT
NS IRRIG 1000ML POUR BTL (IV SOLUTION) IMPLANT
PACK CYSTO (CUSTOM PROCEDURE TRAY) ×2 IMPLANT
STENT CONTOUR 8FR X 24 (STENTS) IMPLANT
TUBE PU 8FR 16IN ENFIT (TUBING) IMPLANT
TUBING CONNECTING 10 (TUBING) ×2 IMPLANT

## 2024-01-29 NOTE — Discharge Instructions (Signed)
 1 - You may have urinary urgency (bladder spasms) and bloody urine on / off with stent in place. This is normal.  2 - Call MD or go to ER for fever >102, severe pain / nausea / vomiting not relieved by medications, or acute change in medical status

## 2024-01-29 NOTE — Transfer of Care (Signed)
 Immediate Anesthesia Transfer of Care Note  Patient: Barbara [name]  Procedure(s) Performed: CYSTOSCOPY, WITH STENT INSERTION (Left: Pelvis) CYSTOSCOPY, WITH RETROGRADE PYELOGRAM (Bilateral: Pelvis)  Patient Location: PACU  Anesthesia Type:General  Level of Consciousness: awake, alert , and oriented  Airway & Oxygen Therapy: Patient Spontanous Breathing  Post-op Assessment: Report given to RN  Post vital signs: Reviewed and stable  Last Vitals:  Vitals Value Taken Time  BP 127/71 01/29/24 1624  Temp  36.8  Pulse 67 01/29/24 1625  Resp 9 01/29/24 1625  SpO2 100 % 01/29/24 1625  Vitals shown include unfiled device data.  Last Pain:  Vitals:   01/29/24 1345  TempSrc: Oral         Complications: No notable events documented.

## 2024-01-29 NOTE — Brief Op Note (Signed)
 01/29/2024  4:09 PM  PATIENT:  Barbara [name]  63 y.o. female  PRE-OPERATIVE DIAGNOSIS:  LEFT MALIGNANT HYDRONEPHROSIS  POST-OPERATIVE DIAGNOSIS:  LEFT MALIGNANT HYDRONEPHROSIS  PROCEDURE:  Procedure(s): CYSTOSCOPY, WITH STENT INSERTION (Left) CYSTOSCOPY, WITH RETROGRADE PYELOGRAM (Bilateral)  SURGEON:  Surgeons and Role:    * Manny, Delbert Phenix., MD - Primary  PHYSICIAN ASSISTANT:   ASSISTANTS: none   ANESTHESIA:   general  EBL:  minimal   BLOOD ADMINISTERED:none  DRAINS: none   LOCAL MEDICATIONS USED:  NONE  SPECIMEN:  Source of Specimen:  left ureteral stent  DISPOSITION OF SPECIMEN:   discard  COUNTS:  YES  TOURNIQUET:  * No tourniquets in log *  DICTATION: .Other Dictation: Dictation Number [id number]  PLAN OF CARE: Discharge to home after PACU  PATIENT DISPOSITION:  PACU - hemodynamically stable.   Delay start of Pharmacological VTE agent (>24hrs) due to surgical blood loss or risk of bleeding: not applicable

## 2024-01-29 NOTE — Anesthesia Preprocedure Evaluation (Addendum)
 Anesthesia Evaluation  Patient identified by MRN, date of birth, ID band Patient awake    Reviewed: Allergy & Precautions, NPO status , Patient's Chart, lab work & pertinent test results  History of Anesthesia Complications Negative for: history of anesthetic complications  Airway Mallampati: I  TM Distance: >3 FB Neck ROM: Full    Dental  (+) Teeth Intact, Dental Advisory Given   Pulmonary former smoker   breath sounds clear to auscultation       Cardiovascular negative cardio ROS  Rhythm:Regular Rate:Normal     Neuro/Psych  Headaches  Anxiety Depression       GI/Hepatic Neg liver ROS,GERD  ,,  Endo/Other  negative endocrine ROS    Renal/GU LEFT MALIGNANT HYDRONEPHROSIS  negative genitourinary   Musculoskeletal  (+) Arthritis ,    Abdominal   Peds  Hematology  (+) Blood dyscrasia (Hgb 10.4), anemia   Anesthesia Other Findings Day of surgery medications reviewed with patient.  Reproductive/Obstetrics negative OB ROS                             Anesthesia Physical Anesthesia Plan  ASA: 2  Anesthesia Plan: General   Post-op Pain Management: Tylenol PO (pre-op)*   Induction: Intravenous  PONV Risk Score and Plan: 3 and Treatment may vary due to age or medical condition, Ondansetron, Dexamethasone and Midazolam  Airway Management Planned: LMA  Additional Equipment: None  Intra-op Plan:   Post-operative Plan: Extubation in OR  Informed Consent: I have reviewed the patients History and Physical, chart, labs and discussed the procedure including the risks, benefits and alternatives for the proposed anesthesia with the patient or authorized representative who has indicated his/her understanding and acceptance.     Dental advisory given  Plan Discussed with:   Anesthesia Plan Comments:        Anesthesia Quick Evaluation

## 2024-01-29 NOTE — Anesthesia Procedure Notes (Signed)
 Procedure Name: LMA Insertion Date/Time: 01/29/2024 3:52 PM  Performed by: Micki Riley, CRNAPre-anesthesia Checklist: Patient identified, Emergency Drugs available, Suction available and Patient being monitored Patient Re-evaluated:Patient Re-evaluated prior to induction Oxygen Delivery Method: Circle System Utilized Preoxygenation: Pre-oxygenation with 100% oxygen Induction Type: IV induction Ventilation: Mask ventilation without difficulty LMA: LMA inserted LMA Size: 4.0 Number of attempts: 1 Airway Equipment and Method: Bite block Placement Confirmation: positive ETCO2 and breath sounds checked- equal and bilateral Tube secured with: Tape Dental Injury: Teeth and Oropharynx as per pre-operative assessment

## 2024-01-29 NOTE — H&P (Signed)
 Barbara [name] is an 63 y.o. female.    Chief Complaint: Pre-OP LEFT Ureteral Stent Exchange  HPI:   1 - Metastatic, Unresectable Bladder Cancer - s/p aborted cystectomy and left ureteral stent exchange (8x24 contour) 09/2023 for intraoperatively metastatic (dense malignant adhesions, peritoneal caricnomatosis, BX proven). Left JJ stent in place. Single ureters bilaterally. S/p 4 cycles neoadjuvant chemo with Michaelyn Barter MD from med-onc at [facility] finishing around 06/2023.   Recent Course:  12/2023 - PET and CT - stable small voluem nodal and extravescial disease on enfortumab with Dr. 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 Her friend Thurston Hole is very involved. Her PCP is Estil Daft.   Today " Barbara [name] " is seen to proceed with LEFT ureteral stent exchange for malignant hydro. NO interval fevers.   Past Medical History:  Diagnosis Date   Adopted    Allergic rhinitis    Anemia    h/o   Anxiety    Arthritis    B12 deficiency    Bilateral carotid artery stenosis    Bilateral carotid bruits    Bladder tumor    Cancer (HCC)    bladder cancer   Depression    Gallbladder polyp    GERD (gastroesophageal reflux disease)    Heart murmur    Hydronephrosis of left kidney    Migraines    Tobacco abuse    UTI (urinary tract infection)     Past Surgical History:  Procedure Laterality Date   ABDOMINAL HYSTERECTOMY     BLADDER SURGERY     BREAST BIOPSY Left 2014   neg   CYSTOSCOPY W/ URETERAL STENT PLACEMENT Left 04/04/2023   Procedure: CYSTOSCOPY WITH RETROGRADE PYELOGRAM/URETERAL STENT PLACEMENT;  Surgeon: Vanna Scotland, MD;  Location: [facility] ORS;  Service: Urology;  Laterality: Left;   FOOT SURGERY Right    2009   IR IMAGING GUIDED PORT INSERTION  05/03/2023   LAPAROSCOPIC APPENDECTOMY  12/05/2012   NASAL SEPTUM SURGERY      ROBOT ASSISTED LAPAROSCOPIC COMPLETE CYSTECT ILEAL CONDUIT N/A 09/25/2023   Procedure: XI ROBOTIC ASSISTED LAPAROSCOPIC EXTENSIVE LYSIS OF ADHESIONS, PERITONEAL BIOPSIES;  Surgeon: Loletta Parish., MD;  Location: [facility] ORS;  Service: Urology;  Laterality: N/A;   SISTRUNK PROCEDURE  03/03/2012   Thyroglossal duct cyst excision   TONSILLECTOMY     TRANSURETHRAL RESECTION OF BLADDER TUMOR N/A 04/04/2023   Procedure: TRANSURETHRAL RESECTION OF BLADDER TUMOR (TURBT);  Surgeon: Vanna Scotland, MD;  Location: [facility] ORS;  Service: Urology;  Laterality: N/A;    Family History  Adopted: Yes   Social History:  reports that she has quit smoking. Her smoking use included cigarettes. She has a 7.5 pack-year smoking history. She has been exposed to tobacco smoke. She has never used smokeless tobacco. She reports that she does not drink alcohol and does not use drugs.  Allergies:  Allergies  Allergen Reactions   Pseudoephedrine Hcl Other (See Comments)    "Jittery"    No medications prior to admission.    No results found for this or any previous visit (from the past 48 hours). No results found.  Review of Systems  Constitutional:  Negative for chills and fever.  All other systems reviewed and are negative.   There were no vitals taken for this visit. Physical Exam Vitals reviewed.  HENT:     Head: Normocephalic.  Eyes:     Pupils: Pupils are equal, round, and reactive to light.  Cardiovascular:     Rate and Rhythm: Normal rate.  Pulmonary:     Effort: Pulmonary effort is normal.  Abdominal:     General: Abdomen is flat.  Genitourinary:    Comments: No CVAT at present Musculoskeletal:        General: Normal range of motion.     Cervical back: Normal range of motion.  Skin:    General: Skin is warm.  Neurological:     General: No focal deficit present.     Mental Status: She is alert.  Psychiatric:        Mood and Affect: Mood normal.      Assessment/Plan  Proceed as  planned with cysto / LEFT stent exchange. Risks, benefits, alternatives, expected peri-op course discussed previously and reiterated today.   Loletta Parish., MD 01/29/2024, 6:52 AM

## 2024-01-30 ENCOUNTER — Encounter (HOSPITAL_COMMUNITY): Payer: Self-pay | Admitting: Urology

## 2024-01-30 NOTE — Anesthesia Postprocedure Evaluation (Signed)
 Anesthesia Post Note  Patient: Barbara [name]  Procedure(s) Performed: CYSTOSCOPY, WITH STENT INSERTION (Left: Pelvis) CYSTOSCOPY, WITH RETROGRADE PYELOGRAM (Bilateral: Pelvis)     Patient location during evaluation: PACU Anesthesia Type: General Level of consciousness: awake and alert Pain management: pain level controlled Vital Signs Assessment: post-procedure vital signs reviewed and stable Respiratory status: spontaneous breathing, nonlabored ventilation and respiratory function stable Cardiovascular status: blood pressure returned to baseline Postop Assessment: no apparent nausea or vomiting Anesthetic complications: no   No notable events documented.  Last Vitals:  Vitals:   01/29/24 1645 01/29/24 1700  BP: 115/70 (!) 130/105  Pulse: 65 63  Resp: 12 13  Temp:  36.7 C  SpO2: 100% 98%    Last Pain:  Vitals:   01/29/24 1700  TempSrc:   PainSc: 0-No pain   Pain Goal:                   Shanda Howells

## 2024-01-30 NOTE — Op Note (Signed)
 [name], [name] MEDICAL RECORD NO: [id number] ACCOUNT NO: [id number] DATE OF BIRTH: 11/12/1960 FACILITY: [facility] LOCATION: [facility] PHYSICIAN: Osborn Blaze, MD  Operative Report   DATE OF PROCEDURE: 01/29/2024  PREOPERATIVE DIAGNOSES:  Advanced bladder cancer with malignant hydronephrosis.  PROCEDURE PERFORMED: 1. Cystoscopy with bilateral retrograde pyelograms interpretation. 2. Exchange of left ureteral stent.  ESTIMATED BLOOD LOSS:  Nil.  COMPLICATION:  None.  SPECIMENS:  Left ureteral stent for discard.  FINDINGS: 1.  Persistent mild left hydronephrosis. 2.  Unremarkable right retrograde pyelogram. 3.  Moderate encrustation of in-situ, left stent. 4.  Successful exchange of left ureteral stent, proximal end in renal pelvis and distal end in urinary bladder.  No tether.  INDICATIONS:  The patient is incredibly pleasant, but unfortunate 63 year old lady with a history of metastatic bladder cancer.  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  She has done remarkably well on systemic immune  therapy with palliative intent.  In order to receive immune therapy, she has to have optimal renal function.  She is therefore temporized with a left ureteral stent.  She has been in approximately 3 months.  We agreed upon operative exchange with  hopefully being able to extend her interval pending findings from today.  Informed consent was obtained and placed in the medical record.  DESCRIPTION OF PROCEDURE:  The patient being identified and verified and the procedure being cysto, bilateral retrograde and left ureteral stent exchange was confirmed.  Procedure timeout was performed.  Intravenous antibiotics administered.  General LMA  anesthesia induced.  The patient was placed into the low lithotomy position.  Sterile field was created.  Prepped and draped in the patient's vagina, introitus and proximal thighs using iodine.  A cystourethroscopy was  performed using 21-French rigid  cystoscope with offset lens.  Inspection of the urinary bladder revealed some papillary nodular changes along the left lateral trigone area consistent with known cancer.  Right ureteral orifice was unremarkable.  Distal end of the left stent was seen.   There was mild to moderate encrustation that was grasped brought on its entirety set aside for discard.  The left ureteral orifice was cannulated with a 6-French end-hole catheter and left retrograde pyelogram was obtained.  Left retrograde pyelogram demonstrated a singe left ureter and a single system left kidney.  There was mild to moderate hydronephrosis that was improved over prior the stent in, but still persistent.  There was clearly further exchange of the stent to be  warranted.  A 0.038 inch sensory wire was passed at the level of the kidney over which a new 8 x 24 contour tip stent was placed using fluoroscopic guidance.  Good proximal and distal planes were noted.  Next, right retrograde pyelogram was obtained.  Right retrograde pyelogram demonstrated a singe right ureter and a single system right kidney.  There was minimal ydronephrosis.  It was not felt that stenting was warranted on the right side.  Bladder was empty per cystoscope.  Procedure was  then terminated.  The patient tolerated the procedure well.  No immediate periprocedural complications.  The patient was taken to post anesthesia care unit in stable condition.  Plan was to discharge home.   PUS D: 01/29/2024 4:15:06 pm T: 01/30/2024 5:50:00 am  JOB: [id number]/ [id number]

## 2024-02-04 ENCOUNTER — Inpatient Hospital Stay: Admitting: Internal Medicine

## 2024-02-04 ENCOUNTER — Inpatient Hospital Stay

## 2024-02-04 ENCOUNTER — Encounter: Payer: Self-pay | Admitting: Internal Medicine

## 2024-02-04 VITALS — BP 111/62 | HR 72

## 2024-02-04 VITALS — BP 128/69 | HR 72 | Temp 96.7°F | Resp 16 | Wt 109.0 lb

## 2024-02-04 DIAGNOSIS — C679 Malignant neoplasm of bladder, unspecified: Secondary | ICD-10-CM

## 2024-02-04 DIAGNOSIS — Z5112 Encounter for antineoplastic immunotherapy: Secondary | ICD-10-CM | POA: Diagnosis not present

## 2024-02-04 LAB — CBC WITH DIFFERENTIAL (CANCER CENTER ONLY)
Abs Immature Granulocytes: 0.02 10*3/uL (ref 0.00–0.07)
Basophils Absolute: 0 10*3/uL (ref 0.0–0.1)
Basophils Relative: 1 %
Eosinophils Absolute: 1.1 10*3/uL — ABNORMAL HIGH (ref 0.0–0.5)
Eosinophils Relative: 18 %
HCT: 35 % — ABNORMAL LOW (ref 36.0–46.0)
Hemoglobin: 11.1 g/dL — ABNORMAL LOW (ref 12.0–15.0)
Immature Granulocytes: 0 %
Lymphocytes Relative: 32 %
Lymphs Abs: 1.9 10*3/uL (ref 0.7–4.0)
MCH: 30.9 pg (ref 26.0–34.0)
MCHC: 31.7 g/dL (ref 30.0–36.0)
MCV: 97.5 fL (ref 80.0–100.0)
Monocytes Absolute: 0.6 10*3/uL (ref 0.1–1.0)
Monocytes Relative: 10 %
Neutro Abs: 2.3 10*3/uL (ref 1.7–7.7)
Neutrophils Relative %: 39 %
Platelet Count: 260 10*3/uL (ref 150–400)
RBC: 3.59 MIL/uL — ABNORMAL LOW (ref 3.87–5.11)
RDW: 15.5 % (ref 11.5–15.5)
WBC Count: 5.9 10*3/uL (ref 4.0–10.5)
nRBC: 0 % (ref 0.0–0.2)

## 2024-02-04 LAB — CMP (CANCER CENTER ONLY)
ALT: 11 U/L (ref 0–44)
AST: 17 U/L (ref 15–41)
Albumin: 3.4 g/dL — ABNORMAL LOW (ref 3.5–5.0)
Alkaline Phosphatase: 49 U/L (ref 38–126)
Anion gap: 9 (ref 5–15)
BUN: 25 mg/dL — ABNORMAL HIGH (ref 8–23)
CO2: 22 mmol/L (ref 22–32)
Calcium: 8.3 mg/dL — ABNORMAL LOW (ref 8.9–10.3)
Chloride: 106 mmol/L (ref 98–111)
Creatinine: 1.09 mg/dL — ABNORMAL HIGH (ref 0.44–1.00)
GFR, Estimated: 57 mL/min — ABNORMAL LOW (ref >60)
Glucose, Bld: 85 mg/dL (ref 70–99)
Potassium: 3.4 mmol/L — ABNORMAL LOW (ref 3.5–5.1)
Sodium: 137 mmol/L (ref 135–145)
Total Bilirubin: 0.6 mg/dL (ref 0.0–1.2)
Total Protein: 7.1 g/dL (ref 6.5–8.1)

## 2024-02-04 MED ORDER — SODIUM CHLORIDE 0.9 % IV SOLN
1.0000 mg/kg | Freq: Once | INTRAVENOUS | Status: AC
  Administered 2024-02-04: 50 mg via INTRAVENOUS
  Filled 2024-02-04: qty 3

## 2024-02-04 MED ORDER — SODIUM CHLORIDE 0.9% FLUSH
10.0000 mL | INTRAVENOUS | Status: DC | PRN
  Administered 2024-02-04: 10 mL
  Filled 2024-02-04: qty 10

## 2024-02-04 MED ORDER — HEPARIN SOD (PORK) LOCK FLUSH 100 UNIT/ML IV SOLN
500.0000 [IU] | Freq: Once | INTRAVENOUS | Status: AC | PRN
  Administered 2024-02-04: 500 [IU]
  Filled 2024-02-04: qty 5

## 2024-02-04 MED ORDER — PROCHLORPERAZINE MALEATE 10 MG PO TABS
10.0000 mg | ORAL_TABLET | Freq: Once | ORAL | Status: AC
  Administered 2024-02-04: 10 mg via ORAL
  Filled 2024-02-04: qty 1

## 2024-02-04 MED ORDER — SODIUM CHLORIDE 0.9 % IV SOLN
INTRAVENOUS | Status: DC
  Filled 2024-02-04: qty 250

## 2024-02-04 NOTE — Patient Instructions (Signed)
 [facility] - A DEPT OF [facility]  Discharge Instructions: Thank you for choosing [facility] to provide your oncology and hematology care.  If you have a lab appointment with the Cancer Center, please go directly to the Cancer Center and check in at the registration area.  Wear comfortable clothing and clothing appropriate for easy access to any Portacath or PICC line.   We strive to give you quality time with your provider. You may need to reschedule your appointment if you arrive late (15 or more minutes).  Arriving late affects you and other patients whose appointments are after yours.  Also, if you miss three or more appointments without notifying the office, you may be dismissed from the clinic at the provider's discretion.      For prescription refill requests, have your pharmacy contact our office and allow 72 hours for refills to be completed.    Today you received the following chemotherapy and/or immunotherapy agents padcev      To help prevent nausea and vomiting after your treatment, we encourage you to take your nausea medication as directed.  BELOW ARE SYMPTOMS THAT SHOULD BE REPORTED IMMEDIATELY: *FEVER GREATER THAN 100.4 F (38 C) OR HIGHER *CHILLS OR SWEATING *NAUSEA AND VOMITING THAT IS NOT CONTROLLED WITH YOUR NAUSEA MEDICATION *UNUSUAL SHORTNESS OF BREATH *UNUSUAL BRUISING OR BLEEDING *URINARY PROBLEMS (pain or burning when urinating, or frequent urination) *BOWEL PROBLEMS (unusual diarrhea, constipation, pain near the anus) TENDERNESS IN MOUTH AND THROAT WITH OR WITHOUT PRESENCE OF ULCERS (sore throat, sores in mouth, or a toothache) UNUSUAL RASH, SWELLING OR PAIN  UNUSUAL VAGINAL DISCHARGE OR ITCHING   Items with * indicate a potential emergency and should be followed up as soon as possible or go to the Emergency Department if any problems should occur.  Please show the CHEMOTHERAPY ALERT CARD or IMMUNOTHERAPY ALERT  CARD at check-in to the Emergency Department and triage nurse.  Should you have questions after your visit or need to cancel or reschedule your appointment, please contact [facility] - A DEPT OF [facility]  [phone] and follow the prompts.  Office hours are 8:00 a.m. to 4:30 p.m. Monday - Friday. Please note that voicemails left after 4:00 p.m. may not be returned until the following business day.  We are closed weekends and major holidays. You have access to a nurse at all times for urgent questions. Please call the main number to the clinic [phone] and follow the prompts.  For any non-urgent questions, you may also contact your provider using MyChart. We now offer e-Visits for anyone 20 and older to request care online for non-urgent symptoms. For details visit [email or web address].   Also download the MyChart app! Go to the app store, search "MyChart", open the app, select [facility], and log in with your MyChart username and password.

## 2024-02-04 NOTE — Progress Notes (Signed)
 [facility] CONSULT NOTE  Patient Care Team: McDonough, Renard Matter as PCP - General (Physician Assistant) Michaelyn Barter, MD as Consulting Physician (Oncology)  REASON FOR VISIT: metastatic bladder cancer  CANCER STAGING   Cancer Staging  Urothelial cancer [facility]) Staging form: Kidney, AJCC 8th Edition - Clinical: Stage III (cT3, cN0, cM0) - Signed by Michaelyn Barter, MD on 05/07/2023 Stage prefix: Initial diagnosis - Pathologic stage from 09/25/2023: Stage IV (pM1) - Signed by Michaelyn Barter, MD on 10/25/2023   ASSESSMENT & PLAN:  Vesta Mixer 63 y.o. female with pmh of anxiety, GERD, right carotid artery stenosis was referred to medical oncology for new diagnosis of muscle invasive urothelial cancer.  # Metastatic urothelial carcinoma - Patient developed urinary symptoms around September or October 2023.  Was treated with multiple rounds of antibiotics with no improvement.  She was then referred to Dr. Apolinar Junes for further evaluation.  - s/p TURBT with left ureteral stent placement by Dr. Apolinar Junes.  IntraOp findings showed large irregular mucosal submucosal extensive tumor from the left dome, left lateral bladder wall majority of trigone including the left UO and bladder neck.  Measures at least 5 x 5 cm. Pathology showed invasive high-grade urothelial carcinoma. Invades muscularis propria.  - Completed neoadjuvant cycle 4-day 1 of split dose cisplatin and gemcitabine (07/23/2023). Pt declined C4D8 due to poor tolerance/nausea/flank pain.  Split dose cisplatin considered for CKD stage III.  - s/p robotic adhesiolysis + peritoneal biopsies 09/25/23 showed urothelial cancer with peritoneal carcinomatosis and small + large bowel serosal involvement. Original plan was cystectomy and urinary diversion but this was aborted based on intra-op findings.   - CT chest abdomen pelvis reviewed from March 2025 showed positive response to the treatment.  Imaging was also discussed in  tumor conference.  Repeat imaging due mid June 2025.  -CBC and CMP reviewed and unremarkable. Proceed with cycle 4-day 8 of enfortumab vedotin 1 mg/kg.  -Tempus testing was sent on peritoneal biopsies - no targets. Summary as below.   # Hypokalemia -Potassium 3.4.  Declined oral and IV potassium supplements. -She will focus on eating potassium rich food.  Continue monitoring.  # Weight loss -Continue to follow with nutrition.  # Maculopapular rash -Likely secondary to enfortumab vedotin -Grade 3 involving more than 30% BSA with severe pruritus, mild fever. -Improved with supportive care-Claritin, Aveeno moisturizer and Singulair. - Dose reduced EV to 1 mg/kg.  Tolerating better.  # Hematuria -Chronic likely due to bladder mass.  # Moderate left hydroureteronephrosis s/p left ureteral stent -Left ureteral stent last replaced on 01/30/24.  Continue follow-up with Dr. Berneice Heinrich.  # Acid reflux/gas/bloating-advised to take OTC simethicone, Tums, Pepcid as needed.  # Coronary atherosclerosis -Detected as incidental finding on CT done for staging purposes. Rest per primary.  # Mild COPD changes -On CT's chest.  Prior history of smoking.  Asymptomatic.  # Access-port placed on 05/03/2023  TEMPUS testing-     Orders Placed This Encounter  Procedures   CBC with Differential (Cancer Center Only)    Standing Status:   Future    Expected Date:   02/18/2024    Expiration Date:   02/17/2025   CMP (Cancer Center only)    Standing Status:   Future    Expected Date:   02/18/2024    Expiration Date:   02/17/2025   CBC with Differential (Cancer Center Only)    Standing Status:   Future    Expected Date:   02/25/2024  Expiration Date:   02/24/2025   CMP (Cancer Center only)    Standing Status:   Future    Expected Date:   02/25/2024    Expiration Date:   02/24/2025   RTC in 2 weeks for MD visit, labs, cycle 5  The total time spent in the appointment was 30 minutes encounter with patients  including review of chart and various tests results, discussions about plan of care and coordination of care plan   All questions were answered. The patient knows to call the clinic with any problems, questions or concerns. No barriers to learning was detected.  Michaelyn Barter, MD 4/15/202511:54 AM   HISTORY OF PRESENTING ILLNESS:  Vesta Mixer 63 y.o. female with pmh of pmh of anxiety, GERD, right carotid artery stenosis was referred to medical oncology for new diagnosis of muscle invasive urothelial cancer.  Patient develops urinary symptoms around September or October 2023.  Was treated with multiple rounds of antibiotics with no improvement.  She was then referred to Dr. Apolinar Junes for further evaluation.  CTU from 03/22/2023 showed moderate left hydroureteronephrosis to the UVJ, marked leftward asymmetric wall thickening of the urinary bladder measuring 18 mm in maximal thickness which extends over the UVJ.  Perivesical stranding soft tissue.  No evidence of upper tract or abdominopelvic metastatic disease.  s/p TURBT with left ureteral stent placement by Dr. Apolinar Junes.  IntraOp findings showed large irregular mucosal submucosal extensive tumor from the left dome, left lateral bladder wall majority of trigone including the left UO and bladder neck.  Measures at least 5 x 5 cm.  Pathology showed invasive high-grade urothelial carcinoma.  There is some discrepancy.  Lower down in the report mentions low-grade.  Invades muscularis propria.  Interval history Patient was seen today as follow-up for stage IV bladder cancer, on Enfortumab and Keytruda. She is doing well overall.  Had left ureteral stent removed last week with Dr. Berneice Heinrich.  Reports intermittent in the left flank.  After last cycle of the treatment, she did have mild rash in bilateral legs and upper part of the chest which she reports has been manageable with supportive care.  Not as severe as it was with the first cycle.  I have  reviewed her chart and materials related to her cancer extensively and collaborated history with the patient. Summary of oncologic history is as follows: Oncology History  Urothelial cancer (HCC)  04/12/2023 Initial Diagnosis   Urothelial cancer (HCC)   04/12/2023 Cancer Staging   Staging form: Kidney, AJCC 8th Edition - Clinical: Stage III (cT3, cN0, cM0) - Signed by Michaelyn Barter, MD on 05/07/2023 Stage prefix: Initial diagnosis   09/25/2023 Cancer Staging   Staging form: Kidney, AJCC 8th Edition - Pathologic stage from 09/25/2023: Stage IV (pM1) - Signed by Michaelyn Barter, MD on 10/25/2023   Malignant neoplasm of urinary bladder (HCC)  04/22/2023 Initial Diagnosis   Malignant neoplasm of urinary bladder (HCC)   05/07/2023 - 07/23/2023 Chemotherapy   Patient is on Treatment Plan : BLADDER Gemcitabine D1,8 + Cisplatin (split dose) D1,8 q21d x 4 cycles     11/05/2023 -  Chemotherapy   Patient is on Treatment Plan : UROTHELIAL ADVANCED, METASTATIC ENFORTUMAB D1, D8 + PEMBROLIZUMAB (200) D1 Q21D       MEDICAL HISTORY:  Past Medical History:  Diagnosis Date   Adopted    Allergic rhinitis    Anemia    h/o   Anxiety    Arthritis    B12 deficiency  Bilateral carotid artery stenosis    Bilateral carotid bruits    Bladder tumor    Cancer (HCC)    bladder cancer   Depression    Gallbladder polyp    GERD (gastroesophageal reflux disease)    Heart murmur    Hydronephrosis of left kidney    Migraines    Tobacco abuse    UTI (urinary tract infection)     SURGICAL HISTORY: Past Surgical History:  Procedure Laterality Date   ABDOMINAL HYSTERECTOMY     BLADDER SURGERY     BREAST BIOPSY Left 2014   neg   CYSTOSCOPY W/ RETROGRADES Bilateral 01/29/2024   Procedure: CYSTOSCOPY, WITH RETROGRADE PYELOGRAM;  Surgeon: Melody Spurling., MD;  Location: [facility];  Service: Urology;  Laterality: Bilateral;   CYSTOSCOPY W/ URETERAL STENT PLACEMENT Left 04/04/2023   Procedure: CYSTOSCOPY  WITH RETROGRADE PYELOGRAM/URETERAL STENT PLACEMENT;  Surgeon: Dustin Gimenez, MD;  Location: [facility];  Service: Urology;  Laterality: Left;   CYSTOSCOPY WITH STENT PLACEMENT Left 01/29/2024   Procedure: CYSTOSCOPY, WITH STENT INSERTION;  Surgeon: Melody Spurling., MD;  Location: [facility];  Service: Urology;  Laterality: Left;   FOOT SURGERY Right    2009   IR IMAGING GUIDED PORT INSERTION  05/03/2023   LAPAROSCOPIC APPENDECTOMY  12/05/2012   NASAL SEPTUM SURGERY     ROBOT ASSISTED LAPAROSCOPIC COMPLETE CYSTECT ILEAL CONDUIT N/A 09/25/2023   Procedure: XI ROBOTIC ASSISTED LAPAROSCOPIC EXTENSIVE LYSIS OF ADHESIONS, PERITONEAL BIOPSIES;  Surgeon: Melody Spurling., MD;  Location: [facility];  Service: Urology;  Laterality: N/A;   SISTRUNK PROCEDURE  03/03/2012   Thyroglossal duct cyst excision   TONSILLECTOMY     TRANSURETHRAL RESECTION OF BLADDER TUMOR N/A 04/04/2023   Procedure: TRANSURETHRAL RESECTION OF BLADDER TUMOR (TURBT);  Surgeon: Dustin Gimenez, MD;  Location: [facility];  Service: Urology;  Laterality: N/A;    SOCIAL HISTORY: Social History   Socioeconomic History   Marital status: Married    Spouse name: Not on file   Number of children: Not on file   Years of education: Not on file   Highest education level: Not on file  Occupational History   Not on file  Tobacco Use   Smoking status: Former    Current packs/day: 0.25    Average packs/day: 0.3 packs/day for 30.0 years (7.5 ttl pk-yrs)    Types: Cigarettes    Passive exposure: Past   Smokeless tobacco: Never  Vaping Use   Vaping status: Never Used  Substance and Sexual Activity   Alcohol use: Never   Drug use: Never   Sexual activity: Not on file  Other Topics Concern   Not on file  Social History Narrative   Not on file   Social Drivers of Health   Financial Resource Strain: Not on file  Food Insecurity: No Food Insecurity (09/25/2023)   Hunger Vital Sign    Worried About Running Out of Food in the Last  Year: Never true    Ran Out of Food in the Last Year: Never true  Transportation Needs: No Transportation Needs (09/25/2023)   PRAPARE - Administrator, Civil Service (Medical): No    Lack of Transportation (Non-Medical): No  Physical Activity: Not on file  Stress: Not on file  Social Connections: Not on file  Intimate Partner Violence: Not At Risk (09/25/2023)   Humiliation, Afraid, Rape, and Kick questionnaire    Fear of Current or Ex-Partner: No    Emotionally Abused: No  Physically Abused: No    Sexually Abused: No    FAMILY HISTORY: Family History  Adopted: Yes    ALLERGIES:  is allergic to pseudoephedrine hcl.  MEDICATIONS:  Current Outpatient Medications  Medication Sig Dispense Refill   acetaminophen (TYLENOL) 500 MG tablet Take 1,000 mg by mouth every 6 (six) hours as needed for moderate pain (pain score 4-6).     carboxymethylcellulose (REFRESH PLUS) 0.5 % SOLN Place 1 drop into both eyes 3 (three) times daily as needed (dry eyes).     escitalopram (LEXAPRO) 5 MG tablet TAKE 1 TABLET BY MOUTH DAILY 90 tablet 1   estradiol (ESTRACE) 1 MG tablet TAKE 1 TABLET BY MOUTH DAILY 90 tablet 3   lidocaine-prilocaine (EMLA) cream Apply 1 Application topically as needed. 30 g 0   montelukast (SINGULAIR) 10 MG tablet Take 1 tablet (10 mg total) by mouth at bedtime. As needed if breaks out into rash (Patient taking differently: Take 10 mg by mouth at bedtime.) 90 tablet 1   ondansetron (ZOFRAN) 8 MG tablet Take 1 tablet (8 mg total) by mouth every 8 (eight) hours as needed for up to 30 doses for nausea or vomiting. 30 tablet 0   oxybutynin (DITROPAN) 5 MG tablet Take 1 tablet (5 mg total) by mouth every 8 (eight) hours as needed for bladder spasms. 30 tablet 0   prochlorperazine (COMPAZINE) 10 MG tablet Take 1 tablet (10 mg total) by mouth every 6 (six) hours as needed for nausea or vomiting. 30 tablet 0   topiramate (TOPAMAX) 50 MG tablet Take 2 tablets (100 mg total) by  mouth at bedtime. 180 tablet 3   traMADol (ULTRAM) 50 MG tablet Take 1-2 tablets (50-100 mg total) by mouth every 6 (six) hours as needed for moderate pain (pain score 4-6) or severe pain (pain score 7-10) (from bladder cancer). 30 tablet 0   trimethoprim (TRIMPEX) 100 MG tablet Take 1 tablet (100 mg total) by mouth daily. 30 tablet 11   carisoprodol (SOMA) 350 MG tablet Take 1 tablet (350 mg total) by mouth at bedtime as needed for muscle spasms. (Patient not taking: Reported on 01/22/2024) 90 tablet 0   dicyclomine (BENTYL) 10 MG capsule Take 1 capsule (10 mg total) by mouth 3 (three) times daily as needed for up to 30 doses for spasms. (Patient not taking: Reported on 01/21/2024) 30 capsule 0   No current facility-administered medications for this visit.   Facility-Administered Medications Ordered in Other Visits  Medication Dose Route Frequency Provider Last Rate Last Admin   heparin lock flush 100 unit/mL  500 Units Intravenous Once Michaelyn Barter, MD       heparin lock flush 100 unit/mL  500 Units Intravenous Once Michaelyn Barter, MD       heparin lock flush 100 unit/mL  500 Units Intravenous Once Michaelyn Barter, MD       sodium chloride flush (NS) 0.9 % injection 10 mL  10 mL Intravenous Once Michaelyn Barter, MD       sodium chloride flush (NS) 0.9 % injection 10 mL  10 mL Intravenous Once Michaelyn Barter, MD       sodium chloride flush (NS) 0.9 % injection 10 mL  10 mL Intravenous Once Michaelyn Barter, MD        REVIEW OF SYSTEMS:   Pertinent information mentioned in HPI All other systems were reviewed with the patient and are negative.  PHYSICAL EXAMINATION: ECOG PERFORMANCE STATUS: 1 - Symptomatic but completely ambulatory  Vitals:  02/04/24 1120  BP: 128/69  Pulse: 72  Resp: 16  Temp: (!) 96.7 F (35.9 C)  SpO2: 100%    Filed Weights   02/04/24 1120  Weight: 109 lb (49.4 kg)     GENERAL:alert, no distress and comfortable SKIN: skin color, texture, turgor are  normal, no rashes or significant lesions EYES: normal, conjunctiva are pink and non-injected, sclera clear OROPHARYNX:no exudate, no erythema and lips, buccal mucosa, and tongue normal  NECK: supple, thyroid normal size, non-tender, without nodularity LYMPH:  no palpable lymphadenopathy in the cervical, axillary or inguinal LUNGS: clear to auscultation and percussion with normal breathing effort HEART: regular rate & rhythm and no murmurs and no lower extremity edema ABDOMEN:abdomen soft, non-tender and normal bowel sounds Musculoskeletal:no cyanosis of digits and no clubbing  PSYCH: alert & oriented x 3 with fluent speech NEURO: no focal motor/sensory deficits  LABORATORY DATA:  I have reviewed the data as listed Lab Results  Component Value Date   WBC 5.9 02/04/2024   HGB 11.1 (L) 02/04/2024   HCT 35.0 (L) 02/04/2024   MCV 97.5 02/04/2024   PLT 260 02/04/2024   Recent Labs    12/24/23 1243 12/31/23 1058 01/21/24 1058  NA 133* 137 135  K 3.2* 3.2* 3.6  CL 102 107 107  CO2 22 24 23   GLUCOSE 156* 106* 92  BUN 18 20 22   CREATININE 1.26* 1.22* 1.05*  CALCIUM 8.7* 8.1* 8.2*  GFRNONAA 48* 50* >60  PROT 7.4 6.6 7.0  ALBUMIN 3.8 3.2* 3.4*  AST 28 26 25   ALT 11 13 13   ALKPHOS 46 34* 43  BILITOT 0.6 0.5 0.5    RADIOGRAPHIC STUDIES: I have personally reviewed the radiological images as listed and agreed with the findings in the report. DG C-Arm 1-60 Min-No Report Result Date: 01/29/2024 Fluoroscopy was utilized by the requesting physician.  No radiographic interpretation.   CT CHEST ABDOMEN PELVIS W CONTRAST Result Date: 01/14/2024 CLINICAL DATA:  History of bladder cancer, assess treatment response, EXAM: CT CHEST, ABDOMEN, AND PELVIS WITH CONTRAST TECHNIQUE: Multidetector CT imaging of the chest, abdomen and pelvis was performed following the standard protocol during bolus administration of intravenous contrast. RADIATION DOSE REDUCTION: This exam was performed according to  the departmental dose-optimization program which includes automated exposure control, adjustment of the mA and/or kV according to patient size and/or use of iterative reconstruction technique. CONTRAST:  OMNIPAQUE IOHEXOL 300 MG/ML  SOLN COMPARISON:  CT abdomen and pelvis June 09, 2023, CT chest April 22, 2023 FINDINGS: CT CHEST FINDINGS Cardiovascular: No significant vascular findings. Normal heart size. No pericardial effusion. Moderate to advanced coronary artery calcifications. No pericardial effusions. Mediastinum/Nodes: No enlarged mediastinal, hilar, or axillary lymph nodes. Thyroid gland, trachea, and esophagus demonstrate no significant findings. Lungs/Pleura: Lungs are clear. No pleural effusion or pneumothorax. No obvious pulmonary nodules. Musculoskeletal: No chest wall mass or suspicious bone lesions identified. CT ABDOMEN PELVIS FINDINGS Hepatobiliary: No focal liver abnormality is seen. No gallstones, gallbladder wall thickening, or biliary dilatation. Suggestion of 1 cm gallstone image 67. Correlate with ultrasound as clinically needed. Pancreas: Unremarkable. No pancreatic ductal dilatation or surrounding inflammatory changes. Spleen: Normal in size without focal abnormality. Adrenals/Urinary Tract: Adrenal glands are unremarkable. Kidneys are normal, without renal calculi, focal lesion, or hydronephrosis. Bladder is unremarkable. Double-J left internal ureteral stent remains in place distal end in the bladder the proximal end in the left renal pelvis. No significant hydronephrosis. Stomach/Bowel: Stomach is within normal limits. Appendix appears normal. No evidence  of bowel wall thickening, distention, or inflammatory changes. Moderate amount of residual fecal material throughout the transverse colon and descending colon. Vascular/Lymphatic: Aortic atherosclerosis. No enlarged abdominal or pelvic lymph nodes. Reproductive: Status post hysterectomy. No adnexal masses. Other: No abdominal wall  hernia or abnormality. No abdominopelvic ascites. Musculoskeletal: No fracture is seen. The visualized portions of the bladder are grossly unremarkable however, no delayed images with IV contrast were obtained to better identified bladder. No obvious bladder masses are identified on these images. No pelvic adenopathy. IMPRESSION: *No evidence of metastatic disease in the chest, abdomen or pelvis. *Double-J left internal ureteral stent remains in place. No significant hydronephrosis. *Suggestion of 1 cm gallstone. Correlate with ultrasound as clinically needed. *Moderate amount of residual fecal material throughout the transverse colon and descending colon. *Aortic atherosclerosis. Electronically Signed   By: Fredrich Jefferson M.D.   On: 01/14/2024 08:59

## 2024-02-04 NOTE — Progress Notes (Signed)
 [facility] Clinical Social Work  Initial Assessment   Barbara [name] is a 63 y.o. year old female presenting alone. Clinical Social Work was referred by medical provider for assessment of psychosocial needs.   SDOH (Social Determinants of Health) assessments performed: No   SDOH Screenings   Food Insecurity: No Food Insecurity (09/25/2023)  Housing: Low Risk  (09/25/2023)  Transportation Needs: No Transportation Needs (09/25/2023)  Utilities: Not At Risk (09/25/2023)  Alcohol Screen: Low Risk  (06/27/2021)  Depression (PHQ2-9): Low Risk  (04/12/2023)  Tobacco Use: Medium Risk (02/04/2024)     Distress Screen completed: No    12/17/2023    1:53 PM  ONCBCN DISTRESS SCREENING  Screening Type Change in Status  Distress experienced in past week (1-10) 6  Practical problem type Insurance  Referral to clinical social work Yes      Family/Social Information:  Housing Arrangement: patient lives with her husband, Marlou Sims.  She is his primary caregiver. Family members/support persons in your life? Family.  Patient has a son and granddaughter who are very supportive. Transportation concerns: no  Employment: Out on work excuse  Income source: Patient is working with her employer to receive disability. 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 Advanced directives: No Services Currently in place:  [employer]  Coping/ Adjustment to diagnosis: Patient understands treatment plan and what happens next? yes Concerns about diagnosis and/or treatment: How I will care for other members of my family Patient reported stressors: Childcare/ elder care Hopes and/or priorities: Family Patient enjoys time with family/ friends Current coping skills/ strengths: Active sense of humor , Average or above average intelligence , Capable of independent living , Communication skills ,  Financial means , General fund of knowledge , Motivation for treatment/growth , Religious Affiliation , and Supportive family/friends     SUMMARY: Current SDOH Barriers:  None per patient.  Clinical Social Work Clinical Goal(s):  Patient will work with SW to address concerns related to adjusting to her illness.  Interventions: Discussed common feeling and emotions when being diagnosed with cancer, and the importance of support during treatment Informed patient of the support team roles and support services at [facility] Provided CSW contact information and encouraged patient to call with any questions or concerns Provided patient with information about Advance Directive clinic and provided a copy of the document to review.  Also discussed patient attending the support group.   Follow Up Plan: CSW will follow-up with patient by phone  Patient verbalizes understanding of plan: Yes    Kennth Peal, LCSW Clinical Social Worker [facility]

## 2024-02-04 NOTE — Progress Notes (Signed)
 Patient is doing better since her surgery, she is taking the compazine on a daily basis, which is helping her with the appetite. She is still having the rash and itchiness. She would like to discuss the CT scan results from 01/06/2024.

## 2024-02-12 ENCOUNTER — Encounter: Payer: Self-pay | Admitting: Internal Medicine

## 2024-02-18 ENCOUNTER — Encounter: Payer: Self-pay | Admitting: Internal Medicine

## 2024-02-18 ENCOUNTER — Inpatient Hospital Stay

## 2024-02-18 ENCOUNTER — Other Ambulatory Visit: Payer: Self-pay

## 2024-02-18 ENCOUNTER — Inpatient Hospital Stay: Admitting: Internal Medicine

## 2024-02-18 VITALS — BP 118/81 | HR 81 | Temp 97.7°F | Resp 18 | Wt 111.0 lb

## 2024-02-18 VITALS — BP 101/69 | HR 76 | Temp 96.1°F | Resp 18

## 2024-02-18 DIAGNOSIS — C689 Malignant neoplasm of urinary organ, unspecified: Secondary | ICD-10-CM

## 2024-02-18 DIAGNOSIS — C679 Malignant neoplasm of bladder, unspecified: Secondary | ICD-10-CM

## 2024-02-18 DIAGNOSIS — Z5112 Encounter for antineoplastic immunotherapy: Secondary | ICD-10-CM

## 2024-02-18 LAB — CBC WITH DIFFERENTIAL (CANCER CENTER ONLY)
Abs Immature Granulocytes: 0.01 10*3/uL (ref 0.00–0.07)
Basophils Absolute: 0 10*3/uL (ref 0.0–0.1)
Basophils Relative: 1 %
Eosinophils Absolute: 0.9 10*3/uL — ABNORMAL HIGH (ref 0.0–0.5)
Eosinophils Relative: 17 %
HCT: 34.7 % — ABNORMAL LOW (ref 36.0–46.0)
Hemoglobin: 11.1 g/dL — ABNORMAL LOW (ref 12.0–15.0)
Immature Granulocytes: 0 %
Lymphocytes Relative: 47 %
Lymphs Abs: 2.6 10*3/uL (ref 0.7–4.0)
MCH: 31.2 pg (ref 26.0–34.0)
MCHC: 32 g/dL (ref 30.0–36.0)
MCV: 97.5 fL (ref 80.0–100.0)
Monocytes Absolute: 0.5 10*3/uL (ref 0.1–1.0)
Monocytes Relative: 9 %
Neutro Abs: 1.4 10*3/uL — ABNORMAL LOW (ref 1.7–7.7)
Neutrophils Relative %: 26 %
Platelet Count: 252 10*3/uL (ref 150–400)
RBC: 3.56 MIL/uL — ABNORMAL LOW (ref 3.87–5.11)
RDW: 15.1 % (ref 11.5–15.5)
WBC Count: 5.5 10*3/uL (ref 4.0–10.5)
nRBC: 0 % (ref 0.0–0.2)

## 2024-02-18 LAB — CMP (CANCER CENTER ONLY)
ALT: 10 U/L (ref 0–44)
AST: 22 U/L (ref 15–41)
Albumin: 3.7 g/dL (ref 3.5–5.0)
Alkaline Phosphatase: 48 U/L (ref 38–126)
Anion gap: 7 (ref 5–15)
BUN: 23 mg/dL (ref 8–23)
CO2: 23 mmol/L (ref 22–32)
Calcium: 8.5 mg/dL — ABNORMAL LOW (ref 8.9–10.3)
Chloride: 106 mmol/L (ref 98–111)
Creatinine: 1.19 mg/dL — ABNORMAL HIGH (ref 0.44–1.00)
GFR, Estimated: 52 mL/min — ABNORMAL LOW (ref >60)
Glucose, Bld: 101 mg/dL — ABNORMAL HIGH (ref 70–99)
Potassium: 3.3 mmol/L — ABNORMAL LOW (ref 3.5–5.1)
Sodium: 136 mmol/L (ref 135–145)
Total Bilirubin: 0.4 mg/dL (ref 0.0–1.2)
Total Protein: 7.2 g/dL (ref 6.5–8.1)

## 2024-02-18 MED ORDER — HEPARIN SOD (PORK) LOCK FLUSH 100 UNIT/ML IV SOLN
500.0000 [IU] | Freq: Once | INTRAVENOUS | Status: AC | PRN
  Administered 2024-02-18: 500 [IU]
  Filled 2024-02-18: qty 5

## 2024-02-18 MED ORDER — SODIUM CHLORIDE 0.9% FLUSH
10.0000 mL | INTRAVENOUS | Status: DC | PRN
  Filled 2024-02-18: qty 10

## 2024-02-18 MED ORDER — SODIUM CHLORIDE 0.9 % IV SOLN
1.0000 mg/kg | Freq: Once | INTRAVENOUS | Status: AC
  Administered 2024-02-18: 50 mg via INTRAVENOUS
  Filled 2024-02-18: qty 3

## 2024-02-18 MED ORDER — PROCHLORPERAZINE MALEATE 10 MG PO TABS: 10.0000 mg | ORAL_TABLET | Freq: Four times a day (QID) | ORAL | 0 refills | Status: DC | PRN

## 2024-02-18 MED ORDER — SODIUM CHLORIDE 0.9 % IV SOLN
200.0000 mg | Freq: Once | INTRAVENOUS | Status: AC
  Administered 2024-02-18: 200 mg via INTRAVENOUS
  Filled 2024-02-18: qty 8

## 2024-02-18 MED ORDER — SODIUM CHLORIDE 0.9 % IV SOLN
INTRAVENOUS | Status: DC
  Filled 2024-02-18: qty 250

## 2024-02-18 MED ORDER — PROCHLORPERAZINE MALEATE 10 MG PO TABS
10.0000 mg | ORAL_TABLET | Freq: Once | ORAL | Status: AC
  Administered 2024-02-18: 10 mg via ORAL
  Filled 2024-02-18: qty 1

## 2024-02-18 NOTE — Progress Notes (Signed)
 [facility] CONSULT NOTE  Patient Care Team: McDonough, Arlyn Bergeron as PCP - General (Physician Assistant) Loreatha Rodney, MD as Consulting Physician (Oncology)  REASON FOR VISIT: metastatic bladder cancer  CANCER STAGING   Cancer Staging  Urothelial cancer [facility]) Staging form: Kidney, AJCC 8th Edition - Clinical: Stage III (cT3, cN0, cM0) - Signed by Pavan Bring, MD on 05/07/2023 Stage prefix: Initial diagnosis - Pathologic stage from 09/25/2023: Stage IV (pM1) - Signed by Loreatha Rodney, MD on 10/25/2023   ASSESSMENT & PLAN:  Barbara [name] 63 y.o. female with pmh of anxiety, GERD, right carotid artery stenosis was referred to medical oncology for new diagnosis of muscle invasive urothelial cancer.  # Metastatic urothelial carcinoma - Patient developed urinary symptoms around September or October 2023.  Was treated with multiple rounds of antibiotics with no improvement.  She was then referred to Dr. Ace Holder for further evaluation.  - s/p TURBT with left ureteral stent placement by Dr. Ace Holder.  IntraOp findings showed large irregular mucosal submucosal extensive tumor from the left dome, left lateral bladder wall majority of trigone including the left UO and bladder neck.  Measures at least 5 x 5 cm. Pathology showed invasive high-grade urothelial carcinoma. Invades muscularis propria.  - Completed neoadjuvant cycle 4-day 1 of split dose cisplatin  and gemcitabine  (07/23/2023). Pt declined C4D8 due to poor tolerance/nausea/flank pain.  Split dose cisplatin  considered for CKD stage III.  - s/p robotic adhesiolysis + peritoneal biopsies 09/25/23 showed urothelial cancer with peritoneal carcinomatosis and small + large bowel serosal involvement. Original plan was cystectomy and urinary diversion but this was aborted based on intra-op findings.   - CT chest abdomen pelvis reviewed from March 2025 showed positive response to the treatment.  Imaging was also discussed in  tumor conference.  Repeat imaging due mid June 2025.  - Labs reviewed.  ANC 1.4 noted.  Will proceed with cycle 5-day 1 of enfortumab vedotin  1 mg/kg and Keytruda  200 mg today as planned.  Repeat labs when she comes in for day 8.  Uses Compazine  once a day for nausea.  -Tempus testing was sent on peritoneal biopsies - no targets. Summary as below.   # Hypokalemia -Potassium 3.4.  Declined oral and IV potassium supplements. -She will focus on eating potassium rich food.  Continue monitoring.  # Weight loss -Continue to follow with nutrition.  # Maculopapular rash -Likely secondary to enfortumab vedotin  - Initially, started with grade 3 involving more than 30% BSA with severe pruritus, mild fever.  Significantly improved after dose reduction. -Improved with supportive care-Claritin, Aveeno moisturizer and Singulair .  # Hematuria -Chronic likely due to bladder mass.  # Moderate left hydroureteronephrosis s/p left ureteral stent -Left ureteral stent last replaced on 01/30/24.  Continue follow-up with Dr. Secundino Dach.  # Acid reflux/gas/bloating-advised to take OTC simethicone, Tums, Pepcid  as needed.  # Coronary atherosclerosis -Detected as incidental finding on CT done for staging purposes. Rest per primary.  # Mild COPD changes -On CT's chest.  Prior history of smoking.  Asymptomatic.  # Access-port placed on 05/03/2023  TEMPUS testing-     Orders Placed This Encounter  Procedures   CBC with Differential (Cancer Center Only)    Standing Status:   Future    Expected Date:   03/10/2024    Expiration Date:   03/10/2025   CMP (Cancer Center only)    Standing Status:   Future    Expected Date:   03/10/2024    Expiration Date:  03/10/2025   CBC with Differential (Cancer Center Only)    Standing Status:   Future    Expected Date:   03/17/2024    Expiration Date:   03/17/2025   CMP (Cancer Center only)    Standing Status:   Future    Expected Date:   03/17/2024    Expiration Date:    03/17/2025   RTC in  The total time spent in the appointment was 30 minutes encounter with patients including review of chart and various tests results, discussions about plan of care and coordination of care plan   All questions were answered. The patient knows to call the clinic with any problems, questions or concerns. No barriers to learning was detected.  Loreatha Rodney, MD 4/29/202510:34 AM   HISTORY OF PRESENTING ILLNESS:  Barbara [name] 63 y.o. female with pmh of pmh of anxiety, GERD, right carotid artery stenosis was referred to medical oncology for new diagnosis of muscle invasive urothelial cancer.  Patient develops urinary symptoms around September or October 2023.  Was treated with multiple rounds of antibiotics with no improvement.  She was then referred to Dr. Ace Holder for further evaluation.  CTU from 03/22/2023 showed moderate left hydroureteronephrosis to the UVJ, marked leftward asymmetric wall thickening of the urinary bladder measuring 18 mm in maximal thickness which extends over the UVJ.  Perivesical stranding soft tissue.  No evidence of upper tract or abdominopelvic metastatic disease.  s/p TURBT with left ureteral stent placement by Dr. Ace Holder.  IntraOp findings showed large irregular mucosal submucosal extensive tumor from the left dome, left lateral bladder wall majority of trigone including the left UO and bladder neck.  Measures at least 5 x 5 cm.  Pathology showed invasive high-grade urothelial carcinoma.  There is some discrepancy.  Lower down in the report mentions low-grade.  Invades muscularis propria.  Interval history Patient was seen today as follow-up for stage IV bladder cancer, on Enfortumab and Keytruda . Tolerating treatment well.  Continues to have mild rash in the bilateral lower and upper extremities at upper chest which is manageable with supportive care.  Overall significantly improved since the initial first 2 cycles.  She has mild nausea which  is manageable with Compazine .  Appetite is fair.  Denies any neuropathy, fever, chills.  I have reviewed her chart and materials related to her cancer extensively and collaborated history with the patient. Summary of oncologic history is as follows: Oncology History  Urothelial cancer (HCC)  04/12/2023 Initial Diagnosis   Urothelial cancer (HCC)   04/12/2023 Cancer Staging   Staging form: Kidney, AJCC 8th Edition - Clinical: Stage III (cT3, cN0, cM0) - Signed by Torina Ey, MD on 05/07/2023 Stage prefix: Initial diagnosis   09/25/2023 Cancer Staging   Staging form: Kidney, AJCC 8th Edition - Pathologic stage from 09/25/2023: Stage IV (pM1) - Signed by Loreatha Rodney, MD on 10/25/2023   Malignant neoplasm of urinary bladder (HCC)  04/22/2023 Initial Diagnosis   Malignant neoplasm of urinary bladder (HCC)   05/07/2023 - 07/23/2023 Chemotherapy   Patient is on Treatment Plan : BLADDER Gemcitabine  D1,8 + Cisplatin  (split dose) D1,8 q21d x 4 cycles     11/05/2023 -  Chemotherapy   Patient is on Treatment Plan : UROTHELIAL ADVANCED, METASTATIC ENFORTUMAB D1, D8 + PEMBROLIZUMAB  (200) D1 Q21D       MEDICAL HISTORY:  Past Medical History:  Diagnosis Date   Adopted    Allergic rhinitis    Anemia    h/o   Anxiety  Arthritis    B12 deficiency    Bilateral carotid artery stenosis    Bilateral carotid bruits    Bladder tumor    Cancer (HCC)    bladder cancer   Depression    Gallbladder polyp    GERD (gastroesophageal reflux disease)    Heart murmur    Hydronephrosis of left kidney    Migraines    Tobacco abuse    UTI (urinary tract infection)     SURGICAL HISTORY: Past Surgical History:  Procedure Laterality Date   ABDOMINAL HYSTERECTOMY     BLADDER SURGERY     BREAST BIOPSY Left 2014   neg   CYSTOSCOPY W/ RETROGRADES Bilateral 01/29/2024   Procedure: CYSTOSCOPY, WITH RETROGRADE PYELOGRAM;  Surgeon: Melody Spurling., MD;  Location: [facility];  Service: Urology;   Laterality: Bilateral;   CYSTOSCOPY W/ URETERAL STENT PLACEMENT Left 04/04/2023   Procedure: CYSTOSCOPY WITH RETROGRADE PYELOGRAM/URETERAL STENT PLACEMENT;  Surgeon: Dustin Gimenez, MD;  Location: [facility];  Service: Urology;  Laterality: Left;   CYSTOSCOPY WITH STENT PLACEMENT Left 01/29/2024   Procedure: CYSTOSCOPY, WITH STENT INSERTION;  Surgeon: Melody Spurling., MD;  Location: [facility];  Service: Urology;  Laterality: Left;   FOOT SURGERY Right    2009   IR IMAGING GUIDED PORT INSERTION  05/03/2023   LAPAROSCOPIC APPENDECTOMY  12/05/2012   NASAL SEPTUM SURGERY     ROBOT ASSISTED LAPAROSCOPIC COMPLETE CYSTECT ILEAL CONDUIT N/A 09/25/2023   Procedure: XI ROBOTIC ASSISTED LAPAROSCOPIC EXTENSIVE LYSIS OF ADHESIONS, PERITONEAL BIOPSIES;  Surgeon: Melody Spurling., MD;  Location: [facility];  Service: Urology;  Laterality: N/A;   SISTRUNK PROCEDURE  03/03/2012   Thyroglossal duct cyst excision   TONSILLECTOMY     TRANSURETHRAL RESECTION OF BLADDER TUMOR N/A 04/04/2023   Procedure: TRANSURETHRAL RESECTION OF BLADDER TUMOR (TURBT);  Surgeon: Dustin Gimenez, MD;  Location: [facility];  Service: Urology;  Laterality: N/A;    SOCIAL HISTORY: Social History   Socioeconomic History   Marital status: Married    Spouse name: Not on file   Number of children: Not on file   Years of education: Not on file   Highest education level: Not on file  Occupational History   Not on file  Tobacco Use   Smoking status: Former    Current packs/day: 0.25    Average packs/day: 0.3 packs/day for 30.0 years (7.5 ttl pk-yrs)    Types: Cigarettes    Passive exposure: Past   Smokeless tobacco: Never  Vaping Use   Vaping status: Never Used  Substance and Sexual Activity   Alcohol  use: Never   Drug use: Never   Sexual activity: Not on file  Other Topics Concern   Not on file  Social History Narrative   Not on file   Social Drivers of Health   Financial Resource Strain: Not on file  Food Insecurity:  No Food Insecurity (09/25/2023)   Hunger Vital Sign    Worried About Running Out of Food in the Last Year: Never true    Ran Out of Food in the Last Year: Never true  Transportation Needs: No Transportation Needs (09/25/2023)   PRAPARE - Administrator, Civil Service (Medical): No    Lack of Transportation (Non-Medical): No  Physical Activity: Not on file  Stress: Not on file  Social Connections: Not on file  Intimate Partner Violence: Not At Risk (09/25/2023)   Humiliation, Afraid, Rape, and Kick questionnaire    Fear of Current  or Ex-Partner: No    Emotionally Abused: No    Physically Abused: No    Sexually Abused: No    FAMILY HISTORY: Family History  Adopted: Yes    ALLERGIES:  is allergic to pseudoephedrine hcl.  MEDICATIONS:  Current Outpatient Medications  Medication Sig Dispense Refill   acetaminophen  (TYLENOL ) 500 MG tablet Take 1,000 mg by mouth every 6 (six) hours as needed for moderate pain (pain score 4-6).     carboxymethylcellulose (REFRESH PLUS) 0.5 % SOLN Place 1 drop into both eyes 3 (three) times daily as needed (dry eyes).     carisoprodol  (SOMA ) 350 MG tablet Take 1 tablet (350 mg total) by mouth at bedtime as needed for muscle spasms. (Patient not taking: Reported on 01/22/2024) 90 tablet 0   dicyclomine  (BENTYL ) 10 MG capsule Take 1 capsule (10 mg total) by mouth 3 (three) times daily as needed for up to 30 doses for spasms. (Patient not taking: Reported on 01/21/2024) 30 capsule 0   escitalopram  (LEXAPRO ) 5 MG tablet TAKE 1 TABLET BY MOUTH DAILY 90 tablet 1   estradiol  (ESTRACE ) 1 MG tablet TAKE 1 TABLET BY MOUTH DAILY 90 tablet 3   lidocaine -prilocaine  (EMLA ) cream Apply 1 Application topically as needed. 30 g 0   montelukast  (SINGULAIR ) 10 MG tablet Take 1 tablet (10 mg total) by mouth at bedtime. As needed if breaks out into rash (Patient taking differently: Take 10 mg by mouth at bedtime.) 90 tablet 1   ondansetron  (ZOFRAN ) 8 MG tablet Take 1  tablet (8 mg total) by mouth every 8 (eight) hours as needed for up to 30 doses for nausea or vomiting. 30 tablet 0   oxybutynin  (DITROPAN ) 5 MG tablet Take 1 tablet (5 mg total) by mouth every 8 (eight) hours as needed for bladder spasms. 30 tablet 0   prochlorperazine  (COMPAZINE ) 10 MG tablet Take 1 tablet (10 mg total) by mouth every 6 (six) hours as needed for nausea or vomiting. 30 tablet 0   topiramate  (TOPAMAX ) 50 MG tablet Take 2 tablets (100 mg total) by mouth at bedtime. 180 tablet 3   traMADol  (ULTRAM ) 50 MG tablet Take 1-2 tablets (50-100 mg total) by mouth every 6 (six) hours as needed for moderate pain (pain score 4-6) or severe pain (pain score 7-10) (from bladder cancer). 30 tablet 0   trimethoprim  (TRIMPEX ) 100 MG tablet Take 1 tablet (100 mg total) by mouth daily. 30 tablet 11   No current facility-administered medications for this visit.   Facility-Administered Medications Ordered in Other Visits  Medication Dose Route Frequency Provider Last Rate Last Admin   0.9 %  sodium chloride  infusion   Intravenous Continuous Cynthya Yam, MD 10 mL/hr at 02/18/24 1008 New Bag at 02/18/24 1008   enfortumab vedotin -ejfv (PADCEV ) 50 mg in sodium chloride  0.9 % 50 mL (0.9091 mg/mL) chemo infusion  1 mg/kg (Treatment Plan Recorded) Intravenous Once Symon Norwood, MD       heparin  lock flush 100 unit/mL  500 Units Intravenous Once Jael Waldorf, MD       heparin  lock flush 100 unit/mL  500 Units Intravenous Once Cristella Stiver, MD       heparin  lock flush 100 unit/mL  500 Units Intravenous Once Halston Kintz, MD       heparin  lock flush 100 unit/mL  500 Units Intracatheter Once PRN Sultana Tierney, MD       pembrolizumab  (KEYTRUDA ) 200 mg in sodium chloride  0.9 % 50 mL chemo infusion  200 mg  Intravenous Once Layney Gillson, MD       sodium chloride  flush (NS) 0.9 % injection 10 mL  10 mL Intravenous Once Areli Frary, MD       sodium chloride  flush (NS) 0.9 % injection 10 mL  10  mL Intravenous Once Breckin Savannah, MD       sodium chloride  flush (NS) 0.9 % injection 10 mL  10 mL Intravenous Once Bali Lyn, MD       sodium chloride  flush (NS) 0.9 % injection 10 mL  10 mL Intracatheter PRN Felicia Bloomquist, MD        REVIEW OF SYSTEMS:   Pertinent information mentioned in HPI All other systems were reviewed with the patient and are negative.  PHYSICAL EXAMINATION: ECOG PERFORMANCE STATUS: 1 - Symptomatic but completely ambulatory  Vitals:   02/18/24 1002  BP: 118/81  Pulse: 81  Resp: 18  Temp: 97.7 F (36.5 C)  SpO2: 100%    Filed Weights   02/18/24 1002  Weight: 111 lb (50.3 kg)     GENERAL:alert, no distress and comfortable SKIN: skin color, texture, turgor are normal, no rashes or significant lesions EYES: normal, conjunctiva are pink and non-injected, sclera clear OROPHARYNX:no exudate, no erythema and lips, buccal mucosa, and tongue normal  NECK: supple, thyroid  normal size, non-tender, without nodularity LYMPH:  no palpable lymphadenopathy in the cervical, axillary or inguinal LUNGS: clear to auscultation and percussion with normal breathing effort HEART: regular rate & rhythm and no murmurs and no lower extremity edema ABDOMEN:abdomen soft, non-tender and normal bowel sounds Musculoskeletal:no cyanosis of digits and no clubbing  PSYCH: alert & oriented x 3 with fluent speech NEURO: no focal motor/sensory deficits  LABORATORY DATA:  I have reviewed the data as listed Lab Results  Component Value Date   WBC 5.5 02/18/2024   HGB 11.1 (L) 02/18/2024   HCT 34.7 (L) 02/18/2024   MCV 97.5 02/18/2024   PLT 252 02/18/2024   Recent Labs    01/21/24 1058 02/04/24 1105 02/18/24 0907  NA 135 137 136  K 3.6 3.4* 3.3*  CL 107 106 106  CO2 23 22 23   GLUCOSE 92 85 101*  BUN 22 25* 23  CREATININE 1.05* 1.09* 1.19*  CALCIUM  8.2* 8.3* 8.5*  GFRNONAA >60 57* 52*  PROT 7.0 7.1 7.2  ALBUMIN 3.4* 3.4* 3.7  AST 25 17 22   ALT 13 11 10    ALKPHOS 43 49 48  BILITOT 0.5 0.6 0.4    RADIOGRAPHIC STUDIES: I have personally reviewed the radiological images as listed and agreed with the findings in the report. DG C-Arm 1-60 Min-No Report Result Date: 01/29/2024 Fluoroscopy was utilized by the requesting physician.  No radiographic interpretation.

## 2024-02-18 NOTE — Patient Instructions (Signed)
 [facility] - A DEPT OF [facility]  Discharge Instructions: Thank you for choosing [facility] to provide your oncology and hematology care.  If you have a lab appointment with the Cancer Center, please go directly to the Cancer Center and check in at the registration area.  Wear comfortable clothing and clothing appropriate for easy access to any Portacath or PICC line.   We strive to give you quality time with your provider. You may need to reschedule your appointment if you arrive late (15 or more minutes).  Arriving late affects you and other patients whose appointments are after yours.  Also, if you miss three or more appointments without notifying the office, you may be dismissed from the clinic at the provider's discretion.      For prescription refill requests, have your pharmacy contact our office and allow 72 hours for refills to be completed.    Today you received the following chemotherapy and/or immunotherapy agents- padcev , keytruda       To help prevent nausea and vomiting after your treatment, we encourage you to take your nausea medication as directed.  BELOW ARE SYMPTOMS THAT SHOULD BE REPORTED IMMEDIATELY: *FEVER GREATER THAN 100.4 F (38 C) OR HIGHER *CHILLS OR SWEATING *NAUSEA AND VOMITING THAT IS NOT CONTROLLED WITH YOUR NAUSEA MEDICATION *UNUSUAL SHORTNESS OF BREATH *UNUSUAL BRUISING OR BLEEDING *URINARY PROBLEMS (pain or burning when urinating, or frequent urination) *BOWEL PROBLEMS (unusual diarrhea, constipation, pain near the anus) TENDERNESS IN MOUTH AND THROAT WITH OR WITHOUT PRESENCE OF ULCERS (sore throat, sores in mouth, or a toothache) UNUSUAL RASH, SWELLING OR PAIN  UNUSUAL VAGINAL DISCHARGE OR ITCHING   Items with * indicate a potential emergency and should be followed up as soon as possible or go to the Emergency Department if any problems should occur.  Please show the CHEMOTHERAPY ALERT CARD or  IMMUNOTHERAPY ALERT CARD at check-in to the Emergency Department and triage nurse.  Should you have questions after your visit or need to cancel or reschedule your appointment, please contact [facility] - A DEPT OF [facility]  [phone] and follow the prompts.  Office hours are 8:00 a.m. to 4:30 p.m. Monday - Friday. Please note that voicemails left after 4:00 p.m. may not be returned until the following business day.  We are closed weekends and major holidays. You have access to a nurse at all times for urgent questions. Please call the main number to the clinic [phone] and follow the prompts.  For any non-urgent questions, you may also contact your provider using MyChart. We now offer e-Visits for anyone 31 and older to request care online for non-urgent symptoms. For details visit [email or web address].   Also download the MyChart app! Go to the app store, search "MyChart", open the app, select [place], and log in with your MyChart username and password.

## 2024-02-18 NOTE — Patient Instructions (Signed)

## 2024-02-18 NOTE — Progress Notes (Signed)
 Really fatigued recently. Compazine  refill is needed, she is doing about the same with the rash and itching, it isn't any worse but not that much better. She would like to start seeing Dr.  Valentine Gasmen when Dr. Aris Bel leaves.

## 2024-02-20 ENCOUNTER — Encounter: Payer: Self-pay | Admitting: Internal Medicine

## 2024-02-25 ENCOUNTER — Encounter: Payer: Self-pay | Admitting: Internal Medicine

## 2024-02-25 ENCOUNTER — Inpatient Hospital Stay

## 2024-02-25 ENCOUNTER — Inpatient Hospital Stay: Attending: Internal Medicine

## 2024-02-25 ENCOUNTER — Inpatient Hospital Stay: Admitting: Internal Medicine

## 2024-02-25 ENCOUNTER — Encounter: Payer: Self-pay | Admitting: *Deleted

## 2024-02-25 VITALS — BP 110/65 | HR 70 | Temp 97.2°F | Resp 18 | Wt 112.2 lb

## 2024-02-25 DIAGNOSIS — R21 Rash and other nonspecific skin eruption: Secondary | ICD-10-CM | POA: Diagnosis not present

## 2024-02-25 DIAGNOSIS — Z79899 Other long term (current) drug therapy: Secondary | ICD-10-CM | POA: Insufficient documentation

## 2024-02-25 DIAGNOSIS — Z5112 Encounter for antineoplastic immunotherapy: Secondary | ICD-10-CM

## 2024-02-25 DIAGNOSIS — C679 Malignant neoplasm of bladder, unspecified: Secondary | ICD-10-CM

## 2024-02-25 DIAGNOSIS — C689 Malignant neoplasm of urinary organ, unspecified: Secondary | ICD-10-CM

## 2024-02-25 LAB — CBC WITH DIFFERENTIAL (CANCER CENTER ONLY)
Abs Immature Granulocytes: 0.01 10*3/uL (ref 0.00–0.07)
Basophils Absolute: 0 10*3/uL (ref 0.0–0.1)
Basophils Relative: 1 %
Eosinophils Absolute: 1.4 10*3/uL — ABNORMAL HIGH (ref 0.0–0.5)
Eosinophils Relative: 23 %
HCT: 34.3 % — ABNORMAL LOW (ref 36.0–46.0)
Hemoglobin: 11.2 g/dL — ABNORMAL LOW (ref 12.0–15.0)
Immature Granulocytes: 0 %
Lymphocytes Relative: 40 %
Lymphs Abs: 2.4 10*3/uL (ref 0.7–4.0)
MCH: 31.7 pg (ref 26.0–34.0)
MCHC: 32.7 g/dL (ref 30.0–36.0)
MCV: 97.2 fL (ref 80.0–100.0)
Monocytes Absolute: 0.5 10*3/uL (ref 0.1–1.0)
Monocytes Relative: 9 %
Neutro Abs: 1.7 10*3/uL (ref 1.7–7.7)
Neutrophils Relative %: 27 %
Platelet Count: 225 10*3/uL (ref 150–400)
RBC: 3.53 MIL/uL — ABNORMAL LOW (ref 3.87–5.11)
RDW: 14.9 % (ref 11.5–15.5)
WBC Count: 6 10*3/uL (ref 4.0–10.5)
nRBC: 0 % (ref 0.0–0.2)

## 2024-02-25 LAB — CMP (CANCER CENTER ONLY)
ALT: 10 U/L (ref 0–44)
AST: 23 U/L (ref 15–41)
Albumin: 3.4 g/dL — ABNORMAL LOW (ref 3.5–5.0)
Alkaline Phosphatase: 46 U/L (ref 38–126)
Anion gap: 8 (ref 5–15)
BUN: 19 mg/dL (ref 8–23)
CO2: 23 mmol/L (ref 22–32)
Calcium: 8.4 mg/dL — ABNORMAL LOW (ref 8.9–10.3)
Chloride: 106 mmol/L (ref 98–111)
Creatinine: 1.26 mg/dL — ABNORMAL HIGH (ref 0.44–1.00)
GFR, Estimated: 48 mL/min — ABNORMAL LOW (ref >60)
Glucose, Bld: 104 mg/dL — ABNORMAL HIGH (ref 70–99)
Potassium: 3.3 mmol/L — ABNORMAL LOW (ref 3.5–5.1)
Sodium: 137 mmol/L (ref 135–145)
Total Bilirubin: 0.3 mg/dL (ref 0.0–1.2)
Total Protein: 6.9 g/dL (ref 6.5–8.1)

## 2024-02-25 MED ORDER — TRIAMCINOLONE ACETONIDE 0.5 % EX OINT: 1.0000 | TOPICAL_OINTMENT | Freq: Two times a day (BID) | CUTANEOUS | 0 refills | Status: DC | PRN

## 2024-02-25 MED ORDER — HEPARIN SOD (PORK) LOCK FLUSH 100 UNIT/ML IV SOLN
500.0000 [IU] | Freq: Once | INTRAVENOUS | Status: AC | PRN
  Administered 2024-02-25: 500 [IU]
  Filled 2024-02-25: qty 5

## 2024-02-25 MED ORDER — SODIUM CHLORIDE 0.9 % IV SOLN
INTRAVENOUS | Status: DC
  Filled 2024-02-25: qty 250

## 2024-02-25 MED ORDER — SODIUM CHLORIDE 0.9 % IV SOLN
1.0000 mg/kg | Freq: Once | INTRAVENOUS | Status: AC
  Administered 2024-02-25: 50 mg via INTRAVENOUS
  Filled 2024-02-25: qty 3

## 2024-02-25 MED ORDER — PROCHLORPERAZINE MALEATE 10 MG PO TABS
10.0000 mg | ORAL_TABLET | Freq: Once | ORAL | Status: AC
  Administered 2024-02-25: 10 mg via ORAL
  Filled 2024-02-25: qty 1

## 2024-02-25 NOTE — Patient Instructions (Signed)

## 2024-02-25 NOTE — Progress Notes (Signed)
 She has been having some pain in her left side near the middle of her back, she said it comes and goes. She is doing about the same when it comes to the rash and her appetite, she has been feeling a little more fatigue/sluggish recently.

## 2024-02-25 NOTE — Progress Notes (Signed)
Acupuncture approval faxed to [facility] chiropract[facility]

## 2024-02-25 NOTE — Patient Instructions (Signed)
 [facility] - A DEPT OF [facility]  Discharge Instructions: Thank you for choosing [facility] to provide your oncology and hematology care.  If you have a lab appointment with the Cancer Center, please go directly to the Cancer Center and check in at the registration area.  Wear comfortable clothing and clothing appropriate for easy access to any Portacath or PICC line.   We strive to give you quality time with your provider. You may need to reschedule your appointment if you arrive late (15 or more minutes).  Arriving late affects you and other patients whose appointments are after yours.  Also, if you miss three or more appointments without notifying the office, you may be dismissed from the clinic at the provider's discretion.      For prescription refill requests, have your pharmacy contact our office and allow 72 hours for refills to be completed.    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  BELOW ARE SYMPTOMS THAT SHOULD BE REPORTED IMMEDIATELY: *FEVER GREATER THAN 100.4 F (38 C) OR HIGHER *CHILLS OR SWEATING *NAUSEA AND VOMITING THAT IS NOT CONTROLLED WITH YOUR NAUSEA MEDICATION *UNUSUAL SHORTNESS OF BREATH *UNUSUAL BRUISING OR BLEEDING *URINARY PROBLEMS (pain or burning when urinating, or frequent urination) *BOWEL PROBLEMS (unusual diarrhea, constipation, pain near the anus) TENDERNESS IN MOUTH AND THROAT WITH OR WITHOUT PRESENCE OF ULCERS (sore throat, sores in mouth, or a toothache) UNUSUAL RASH, SWELLING OR PAIN  UNUSUAL VAGINAL DISCHARGE OR ITCHING   Items with * indicate a potential emergency and should be followed up as soon as possible or go to the Emergency Department if any problems should occur.  Please show the CHEMOTHERAPY ALERT CARD or IMMUNOTHERAPY ALERT  CARD at check-in to the Emergency Department and triage nurse.  Should you have questions after your visit or need to cancel or reschedule your appointment, please contact [facility] - A DEPT OF [facility]  [phone] and follow the prompts.  Office hours are 8:00 a.m. to 4:30 p.m. Monday - Friday. Please note that voicemails left after 4:00 p.m. may not be returned until the following business day.  We are closed weekends and major holidays. You have access to a nurse at all times for urgent questions. Please call the main number to the clinic [phone] and follow the prompts.  For any non-urgent questions, you may also contact your provider using MyChart. We now offer e-Visits for anyone 99 and older to request care online for non-urgent symptoms. For details visit [email or web address].   Also download the MyChart app! Go to the app store, search "MyChart", open the app, select [facility], and log in with your MyChart username and password.  Enfortumab Vedotin  Injection What is this medication? ENFORTUMAB VEDOTIN  (en FORT ue mab ve DOE tin) treats bladder cancer and kidney cancer. It works by blocking a protein that causes cancer cells to grow and multiply. This helps to slow or stop the spread of cancer cells. This medicine may be used for other purposes; ask your health care provider or pharmacist if you have questions. COMMON BRAND NAME(S): PADCEV  What should I tell my care team before I take this medication? They need to know if you have any of these conditions: Diabetes Eye disease Liver disease  Lung disease Tingling of the fingers or toes or other nerve disorder Vision problems An unusual or allergic reaction to enfortumab vedotin , other medications, foods, dyes, or preservatives Pregnant or trying to get pregnant Breast-feeding How should I use this medication? This medication is injected into a vein. It is given by your care team in a  hospital or clinic setting. Talk to your care team about the use of this medication in children. Special care may be needed. Overdosage: If you think you have taken too much of this medicine contact a poison control center or emergency room at once. NOTE: This medicine is only for you. Do not share this medicine with others. What if I miss a dose? Keep appointments for follow-up doses. It is important not to miss your dose. Call your care team if you are unable to keep an appointment. What may interact with this medication? This medication may affect how other medications work, and other medications may affect how this medication works. Talk with your care team about all of the medications you take. They may suggest changes to your treatment plan to lower the risk of side effects and to make sure your medications work as intended. This list may not describe all possible interactions. Give your health care provider a list of all the medicines, herbs, non-prescription drugs, or dietary supplements you use. Also tell them if you smoke, drink alcohol , or use illegal drugs. Some items may interact with your medicine. What should I watch for while using this medication? Your condition will be monitored carefully while you are receiving this medication. This medication may make you feel generally unwell. This is not uncommon as chemotherapy can affect healthy cells as well as cancer cells. Report any side effects. Continue your course of treatment even though you feel ill unless your care team tells you to stop. This medication may increase blood sugar. The risk may be higher in patients who already have diabetes. Ask your care team what you can do to lower your risk of diabetes while taking this medication. This medication can cause a serious condition in which there is too much acid in your blood. If you develop nausea, vomiting, stomach pain, unusual tiredness, or trouble breathing, stop taking this  medication and call your care team right away. If possible, use a ketone dipstick to check for ketones in your urine. This medication may cause dry eyes and blurred vision. If you wear contact lenses, you may feel some discomfort. Lubricating eye drops may help. See your care team if the problem does not go away or is severe. Tell your care team right away if you have any change in your eyesight. This medication may increase your risk of getting an infection. Call your care team for advice if you get a fever, chills, sore throat, or other symptoms of a cold or flu. Do not treat yourself. Try to avoid being around people who are sick. Avoid taking medications that contain aspirin, acetaminophen , ibuprofen, naproxen, or ketoprofen unless instructed by your care team. These medications may hide a fever. This medication may cause serious skin reactions. They can happen weeks to months after starting the medication. Contact your care team right away if you notice fevers or flu-like symptoms with a rash. The rash may be red or purple and then turn into blisters or peeling of the skin. You may also notice a red rash with swelling of the face, lips or lymph nodes in your neck or under your arms.  Talk to your care team if you or your partner may be pregnant. Serious birth defects can occur if you take this medication during pregnancy and for 2 months after the last dose. You will need a negative pregnancy test before starting this medication. Contraception is recommended while taking this medication and for 2 months after the last dose. Your care team can help you find the option that works for you. If your partner can get pregnant, use a condom during sex while taking this medication and for 4 months after the last dose. Do not breastfeed while taking this medicine or for at least 3 weeks after the last dose. This medication may cause infertility. Talk to your care team if you are concerned about your  fertility. What side effects may I notice from receiving this medication? Side effects that you should report to your care team as soon as possible: Allergic reactions--skin rash, itching, hives, swelling of the face, lips, tongue, or throat Dry cough, shortness of breath or trouble breathing Eye pain, redness, irritation, or discharge with blurry or decreased vision High blood sugar (hyperglycemia)--increased thirst or amount of urine, unusual weakness or fatigue, blurry vision Painful swelling, warmth, or redness of the skin, blisters or sores at the infusion site Pain, tingling, or numbness in the hands or feet Redness, blistering, peeling, or loosening of the skin, including inside the mouth Unusual bruising or bleeding Side effects that usually do not require medical attention (report these to your care team if they continue or are bothersome): Change in taste Diarrhea Dry eyes Fatigue Hair loss Loss of appetite This list may not describe all possible side effects. Call your doctor for medical advice about side effects. You may report side effects to FDA at 1-800-FDA-1088. Where should I keep my medication? This medication is given in a hospital or clinic. It will not be stored at home. NOTE: This sheet is a summary. It may not cover all possible information. If you have questions about this medicine, talk to your doctor, pharmacist, or health care provider.  2024 [employer]/Gold Standard (2022-02-20 00:00:00)

## 2024-02-25 NOTE — Progress Notes (Signed)
 [facility] CONSULT NOTE  Patient Care Team: McDonough, Arlyn Bergeron as PCP - General (Physician Assistant) Loreatha Rodney, MD as Consulting Physician (Oncology)  REASON FOR VISIT: metastatic bladder cancer  CANCER STAGING   Cancer Staging  Urothelial cancer [facility]) Staging form: Kidney, AJCC 8th Edition - Clinical: Stage III (cT3, cN0, cM0) - Signed by Montrae Braithwaite, MD on 05/07/2023 Stage prefix: Initial diagnosis - Pathologic stage from 09/25/2023: Stage IV (pM1) - Signed by Loreatha Rodney, MD on 10/25/2023   ASSESSMENT & PLAN:  Barbara [name] 63 y.o. female with pmh of anxiety, GERD, right carotid artery stenosis was referred to medical oncology for new diagnosis of muscle invasive urothelial cancer.  # Metastatic urothelial carcinoma - Patient developed urinary symptoms around September or October 2023.  Was treated with multiple rounds of antibiotics with no improvement.  She was then referred to Dr. Ace Holder for further evaluation.  - s/p TURBT with left ureteral stent placement by Dr. Ace Holder.  IntraOp findings showed large irregular mucosal submucosal extensive tumor from the left dome, left lateral bladder wall majority of trigone including the left UO and bladder neck.  Measures at least 5 x 5 cm. Pathology showed invasive high-grade urothelial carcinoma. Invades muscularis propria.  - Completed neoadjuvant cycle 4-day 1 of split dose cisplatin  and gemcitabine  (07/23/2023). Pt declined C4D8 due to poor tolerance/nausea/flank pain.  Split dose cisplatin  considered for CKD stage III.  - s/p robotic adhesiolysis + peritoneal biopsies 09/25/23 showed urothelial cancer with peritoneal carcinomatosis and small + large bowel serosal involvement. Original plan was cystectomy and urinary diversion but this was aborted based on intra-op findings.   - CT chest abdomen pelvis reviewed from March 2025 showed positive response to the treatment.  Imaging was also discussed in  tumor conference.  Repeat imaging due mid June 2025.  - Labs reviewed and acceptable for treatment.  Will proceed with cycle 5-day 8 of enfortumab vedotin  1 mg/kg.  Follow-up in 2 weeks.  -Tempus testing was sent on peritoneal biopsies - no targets. Summary as below.   # Hypokalemia -Potassium 3.3.  Declined oral and IV potassium supplements. -She will focus on eating potassium rich food.  Continue monitoring.  # Weight loss -Continue to follow with nutrition.  # Maculopapular rash -Likely secondary to enfortumab vedotin  -Initially, started with grade 3 involving more than 30% BSA with severe pruritus, mild fever.  Significantly improved after dose reduction. - Continue with supportive care-Claritin, Aveeno moisturizer and Singulair . -Recently, she has been having intermittent flareups on the arm and leg.  Sent Kenalog  0.5% twice daily as needed to the affected areas.  Avoid use on face, groin and axilla.  # Hematuria -Chronic likely due to bladder mass.  # Moderate left hydroureteronephrosis s/p left ureteral stent -Left ureteral stent last replaced on 01/30/24.  Continue follow-up with Dr. Secundino Dach.  # Acid reflux/gas/bloating-advised to take OTC simethicone, Tums, Pepcid  as needed.  # Coronary atherosclerosis -Detected as incidental finding on CT done for staging purposes. Rest per primary.  # Mild COPD changes -On CT's chest.  Prior history of smoking.  Asymptomatic.  # Access-port placed on 05/03/2023  TEMPUS testing-     No orders of the defined types were placed in this encounter.   The total time spent in the appointment was 30 minutes encounter with patients including review of chart and various tests results, discussions about plan of care and coordination of care plan   All questions were answered. The patient knows to  call the clinic with any problems, questions or concerns. No barriers to learning was detected.  Loreatha Rodney, MD 5/6/20259:45 AM   HISTORY  OF PRESENTING ILLNESS:  Barbara [name] 63 y.o. female with pmh of pmh of anxiety, GERD, right carotid artery stenosis was referred to medical oncology for new diagnosis of muscle invasive urothelial cancer.  Patient develops urinary symptoms around September or October 2023.  Was treated with multiple rounds of antibiotics with no improvement.  She was then referred to Dr. Ace Holder for further evaluation.  CTU from 03/22/2023 showed moderate left hydroureteronephrosis to the UVJ, marked leftward asymmetric wall thickening of the urinary bladder measuring 18 mm in maximal thickness which extends over the UVJ.  Perivesical stranding soft tissue.  No evidence of upper tract or abdominopelvic metastatic disease.  s/p TURBT with left ureteral stent placement by Dr. Ace Holder.  IntraOp findings showed large irregular mucosal submucosal extensive tumor from the left dome, left lateral bladder wall majority of trigone including the left UO and bladder neck.  Measures at least 5 x 5 cm.  Pathology showed invasive high-grade urothelial carcinoma.  There is some discrepancy.  Lower down in the report mentions low-grade.  Invades muscularis propria.  Interval history Patient was seen today as follow-up for stage IV bladder cancer, on Enfortumab and Keytruda . Tolerating treatment well overall.  She has been having intermittent left flank pain which is chronic and denies any recent worsening.  Denies any changes with urinary symptoms from her baseline.  Recently, having mild flareup of the rash on the bilateral arms and on the leg.  Associated with itching.  She continues to use Claritin, Singulair  and moisturizer.  I have reviewed her chart and materials related to her cancer extensively and collaborated history with the patient. Summary of oncologic history is as follows: Oncology History  Urothelial cancer (HCC)  04/12/2023 Initial Diagnosis   Urothelial cancer (HCC)   04/12/2023 Cancer Staging   Staging form:  Kidney, AJCC 8th Edition - Clinical: Stage III (cT3, cN0, cM0) - Signed by Denzil Bristol, MD on 05/07/2023 Stage prefix: Initial diagnosis   09/25/2023 Cancer Staging   Staging form: Kidney, AJCC 8th Edition - Pathologic stage from 09/25/2023: Stage IV (pM1) - Signed by Loreatha Rodney, MD on 10/25/2023   Malignant neoplasm of urinary bladder (HCC)  04/22/2023 Initial Diagnosis   Malignant neoplasm of urinary bladder (HCC)   05/07/2023 - 07/23/2023 Chemotherapy   Patient is on Treatment Plan : BLADDER Gemcitabine  D1,8 + Cisplatin  (split dose) D1,8 q21d x 4 cycles     11/05/2023 -  Chemotherapy   Patient is on Treatment Plan : UROTHELIAL ADVANCED, METASTATIC ENFORTUMAB D1, D8 + PEMBROLIZUMAB  (200) D1 Q21D       MEDICAL HISTORY:  Past Medical History:  Diagnosis Date   Adopted    Allergic rhinitis    Anemia    h/o   Anxiety    Arthritis    B12 deficiency    Bilateral carotid artery stenosis    Bilateral carotid bruits    Bladder tumor    Cancer (HCC)    bladder cancer   Depression    Gallbladder polyp    GERD (gastroesophageal reflux disease)    Heart murmur    Hydronephrosis of left kidney    Migraines    Tobacco abuse    UTI (urinary tract infection)     SURGICAL HISTORY: Past Surgical History:  Procedure Laterality Date   ABDOMINAL HYSTERECTOMY     BLADDER SURGERY  BREAST BIOPSY Left 2014   neg   CYSTOSCOPY W/ RETROGRADES Bilateral 01/29/2024   Procedure: CYSTOSCOPY, WITH RETROGRADE PYELOGRAM;  Surgeon: Melody Spurling., MD;  Location: [facility];  Service: Urology;  Laterality: Bilateral;   CYSTOSCOPY W/ URETERAL STENT PLACEMENT Left 04/04/2023   Procedure: CYSTOSCOPY WITH RETROGRADE PYELOGRAM/URETERAL STENT PLACEMENT;  Surgeon: Dustin Gimenez, MD;  Location: [facility];  Service: Urology;  Laterality: Left;   CYSTOSCOPY WITH STENT PLACEMENT Left 01/29/2024   Procedure: CYSTOSCOPY, WITH STENT INSERTION;  Surgeon: Melody Spurling., MD;  Location: [facility];  Service:  Urology;  Laterality: Left;   FOOT SURGERY Right    2009   IR IMAGING GUIDED PORT INSERTION  05/03/2023   LAPAROSCOPIC APPENDECTOMY  12/05/2012   NASAL SEPTUM SURGERY     ROBOT ASSISTED LAPAROSCOPIC COMPLETE CYSTECT ILEAL CONDUIT N/A 09/25/2023   Procedure: XI ROBOTIC ASSISTED LAPAROSCOPIC EXTENSIVE LYSIS OF ADHESIONS, PERITONEAL BIOPSIES;  Surgeon: Melody Spurling., MD;  Location: [facility];  Service: Urology;  Laterality: N/A;   SISTRUNK PROCEDURE  03/03/2012   Thyroglossal duct cyst excision   TONSILLECTOMY     TRANSURETHRAL RESECTION OF BLADDER TUMOR N/A 04/04/2023   Procedure: TRANSURETHRAL RESECTION OF BLADDER TUMOR (TURBT);  Surgeon: Dustin Gimenez, MD;  Location: [facility];  Service: Urology;  Laterality: N/A;    SOCIAL HISTORY: Social History   Socioeconomic History   Marital status: Married    Spouse name: Not on file   Number of children: Not on file   Years of education: Not on file   Highest education level: Not on file  Occupational History   Not on file  Tobacco Use   Smoking status: Former    Current packs/day: 0.25    Average packs/day: 0.3 packs/day for 30.0 years (7.5 ttl pk-yrs)    Types: Cigarettes    Passive exposure: Past   Smokeless tobacco: Never  Vaping Use   Vaping status: Never Used  Substance and Sexual Activity   Alcohol  use: Never   Drug use: Never   Sexual activity: Not on file  Other Topics Concern   Not on file  Social History Narrative   Not on file   Social Drivers of Health   Financial Resource Strain: Not on file  Food Insecurity: No Food Insecurity (09/25/2023)   Hunger Vital Sign    Worried About Running Out of Food in the Last Year: Never true    Ran Out of Food in the Last Year: Never true  Transportation Needs: No Transportation Needs (09/25/2023)   PRAPARE - Administrator, Civil Service (Medical): No    Lack of Transportation (Non-Medical): No  Physical Activity: Not on file  Stress: Not on file  Social  Connections: Not on file  Intimate Partner Violence: Not At Risk (09/25/2023)   Humiliation, Afraid, Rape, and Kick questionnaire    Fear of Current or Ex-Partner: No    Emotionally Abused: No    Physically Abused: No    Sexually Abused: No    FAMILY HISTORY: Family History  Adopted: Yes    ALLERGIES:  is allergic to pseudoephedrine hcl.  MEDICATIONS:  Current Outpatient Medications  Medication Sig Dispense Refill   triamcinolone  ointment (KENALOG ) 0.5 % Apply 1 Application topically 2 (two) times daily as needed (on flared up areas of arms and legs). Do not apply on face, armpit and groin. 30 g 0   acetaminophen  (TYLENOL ) 500 MG tablet Take 1,000 mg by mouth every 6 (six) hours  as needed for moderate pain (pain score 4-6).     carboxymethylcellulose (REFRESH PLUS) 0.5 % SOLN Place 1 drop into both eyes 3 (three) times daily as needed (dry eyes).     carisoprodol  (SOMA ) 350 MG tablet Take 1 tablet (350 mg total) by mouth at bedtime as needed for muscle spasms. (Patient not taking: Reported on 01/22/2024) 90 tablet 0   dicyclomine  (BENTYL ) 10 MG capsule Take 1 capsule (10 mg total) by mouth 3 (three) times daily as needed for up to 30 doses for spasms. (Patient not taking: Reported on 01/21/2024) 30 capsule 0   escitalopram  (LEXAPRO ) 5 MG tablet TAKE 1 TABLET BY MOUTH DAILY 90 tablet 1   estradiol  (ESTRACE ) 1 MG tablet TAKE 1 TABLET BY MOUTH DAILY 90 tablet 3   lidocaine -prilocaine  (EMLA ) cream Apply 1 Application topically as needed. 30 g 0   montelukast  (SINGULAIR ) 10 MG tablet Take 1 tablet (10 mg total) by mouth at bedtime. As needed if breaks out into rash (Patient taking differently: Take 10 mg by mouth at bedtime.) 90 tablet 1   ondansetron  (ZOFRAN ) 8 MG tablet Take 1 tablet (8 mg total) by mouth every 8 (eight) hours as needed for up to 30 doses for nausea or vomiting. 30 tablet 0   oxybutynin  (DITROPAN ) 5 MG tablet Take 1 tablet (5 mg total) by mouth every 8 (eight) hours as needed  for bladder spasms. 30 tablet 0   prochlorperazine  (COMPAZINE ) 10 MG tablet Take 1 tablet (10 mg total) by mouth every 6 (six) hours as needed for nausea or vomiting. 30 tablet 0   topiramate  (TOPAMAX ) 50 MG tablet Take 2 tablets (100 mg total) by mouth at bedtime. 180 tablet 3   traMADol  (ULTRAM ) 50 MG tablet Take 1-2 tablets (50-100 mg total) by mouth every 6 (six) hours as needed for moderate pain (pain score 4-6) or severe pain (pain score 7-10) (from bladder cancer). 30 tablet 0   trimethoprim  (TRIMPEX ) 100 MG tablet Take 1 tablet (100 mg total) by mouth daily. 30 tablet 11   No current facility-administered medications for this visit.   Facility-Administered Medications Ordered in Other Visits  Medication Dose Route Frequency Provider Last Rate Last Admin   0.9 %  sodium chloride  infusion   Intravenous Continuous Pearle Wandler, MD 10 mL/hr at 02/25/24 0942 New Bag at 02/25/24 0942   enfortumab vedotin -ejfv (PADCEV ) 50 mg in sodium chloride  0.9 % 50 mL (0.9091 mg/mL) chemo infusion  1 mg/kg (Treatment Plan Recorded) Intravenous Once Emersen Carroll, MD       heparin  lock flush 100 unit/mL  500 Units Intravenous Once Maribeth Jiles, MD       heparin  lock flush 100 unit/mL  500 Units Intravenous Once Taran Haynesworth, MD       heparin  lock flush 100 unit/mL  500 Units Intravenous Once Cerissa Zeiger, MD       heparin  lock flush 100 unit/mL  500 Units Intracatheter Once PRN Hutson Luft, MD       sodium chloride  flush (NS) 0.9 % injection 10 mL  10 mL Intravenous Once Natori Gudino, MD       sodium chloride  flush (NS) 0.9 % injection 10 mL  10 mL Intravenous Once Rojelio Uhrich, MD       sodium chloride  flush (NS) 0.9 % injection 10 mL  10 mL Intravenous Once Mat Stuard, MD        REVIEW OF SYSTEMS:   Pertinent information mentioned in HPI All other systems were  reviewed with the patient and are negative.  PHYSICAL EXAMINATION: ECOG PERFORMANCE STATUS: 1 - Symptomatic but  completely ambulatory  Vitals:   02/25/24 0905  BP: 110/65  Pulse: 70  Resp: 18  Temp: (!) 97.2 F (36.2 C)  SpO2: 99%    Filed Weights   02/25/24 0905  Weight: 112 lb 3.2 oz (50.9 kg)     GENERAL:alert, no distress and comfortable SKIN: skin color, texture, turgor are normal, no rashes or significant lesions EYES: normal, conjunctiva are pink and non-injected, sclera clear OROPHARYNX:no exudate, no erythema and lips, buccal mucosa, and tongue normal  NECK: supple, thyroid  normal size, non-tender, without nodularity LYMPH:  no palpable lymphadenopathy in the cervical, axillary or inguinal LUNGS: clear to auscultation and percussion with normal breathing effort HEART: regular rate & rhythm and no murmurs and no lower extremity edema ABDOMEN:abdomen soft, non-tender and normal bowel sounds Musculoskeletal:no cyanosis of digits and no clubbing  PSYCH: alert & oriented x 3 with fluent speech NEURO: no focal motor/sensory deficits  LABORATORY DATA:  I have reviewed the data as listed Lab Results  Component Value Date   WBC 6.0 02/25/2024   HGB 11.2 (L) 02/25/2024   HCT 34.3 (L) 02/25/2024   MCV 97.2 02/25/2024   PLT 225 02/25/2024   Recent Labs    02/04/24 1105 02/18/24 0907 02/25/24 0844  NA 137 136 137  K 3.4* 3.3* 3.3*  CL 106 106 106  CO2 22 23 23   GLUCOSE 85 101* 104*  BUN 25* 23 19  CREATININE 1.09* 1.19* 1.26*  CALCIUM  8.3* 8.5* 8.4*  GFRNONAA 57* 52* 48*  PROT 7.1 7.2 6.9  ALBUMIN 3.4* 3.7 3.4*  AST 17 22 23   ALT 11 10 10   ALKPHOS 49 48 46  BILITOT 0.6 0.4 0.3    RADIOGRAPHIC STUDIES: I have personally reviewed the radiological images as listed and agreed with the findings in the report. DG C-Arm 1-60 Min-No Report Result Date: 01/29/2024 Fluoroscopy was utilized by the requesting physician.  No radiographic interpretation.

## 2024-03-03 ENCOUNTER — Other Ambulatory Visit: Payer: Self-pay | Admitting: Internal Medicine

## 2024-03-03 DIAGNOSIS — G43509 Persistent migraine aura without cerebral infarction, not intractable, without status migrainosus: Secondary | ICD-10-CM

## 2024-03-04 NOTE — Telephone Encounter (Signed)
 Please review ok to send its give me alert

## 2024-03-10 ENCOUNTER — Encounter: Payer: Self-pay | Admitting: Internal Medicine

## 2024-03-11 ENCOUNTER — Inpatient Hospital Stay

## 2024-03-11 ENCOUNTER — Encounter: Payer: Self-pay | Admitting: Internal Medicine

## 2024-03-11 ENCOUNTER — Inpatient Hospital Stay: Admitting: Internal Medicine

## 2024-03-11 ENCOUNTER — Other Ambulatory Visit: Payer: Self-pay | Admitting: Internal Medicine

## 2024-03-11 VITALS — BP 113/73 | HR 62 | Temp 97.1°F | Resp 16 | Ht 65.5 in | Wt 111.7 lb

## 2024-03-11 DIAGNOSIS — C679 Malignant neoplasm of bladder, unspecified: Secondary | ICD-10-CM

## 2024-03-11 DIAGNOSIS — C689 Malignant neoplasm of urinary organ, unspecified: Secondary | ICD-10-CM | POA: Diagnosis not present

## 2024-03-11 DIAGNOSIS — Z5112 Encounter for antineoplastic immunotherapy: Secondary | ICD-10-CM | POA: Diagnosis not present

## 2024-03-11 LAB — CMP (CANCER CENTER ONLY)
ALT: 9 U/L (ref 0–44)
AST: 18 U/L (ref 15–41)
Albumin: 3.5 g/dL (ref 3.5–5.0)
Alkaline Phosphatase: 43 U/L (ref 38–126)
Anion gap: 5 (ref 5–15)
BUN: 22 mg/dL (ref 8–23)
CO2: 25 mmol/L (ref 22–32)
Calcium: 8.6 mg/dL — ABNORMAL LOW (ref 8.9–10.3)
Chloride: 107 mmol/L (ref 98–111)
Creatinine: 1.13 mg/dL — ABNORMAL HIGH (ref 0.44–1.00)
GFR, Estimated: 55 mL/min — ABNORMAL LOW (ref >60)
Glucose, Bld: 83 mg/dL (ref 70–99)
Potassium: 3.6 mmol/L (ref 3.5–5.1)
Sodium: 137 mmol/L (ref 135–145)
Total Bilirubin: 0.3 mg/dL (ref 0.0–1.2)
Total Protein: 7.1 g/dL (ref 6.5–8.1)

## 2024-03-11 LAB — CBC WITH DIFFERENTIAL (CANCER CENTER ONLY)
Abs Immature Granulocytes: 0.02 10*3/uL (ref 0.00–0.07)
Basophils Absolute: 0 10*3/uL (ref 0.0–0.1)
Basophils Relative: 1 %
Eosinophils Absolute: 0.9 10*3/uL — ABNORMAL HIGH (ref 0.0–0.5)
Eosinophils Relative: 14 %
HCT: 32.9 % — ABNORMAL LOW (ref 36.0–46.0)
Hemoglobin: 11 g/dL — ABNORMAL LOW (ref 12.0–15.0)
Immature Granulocytes: 0 %
Lymphocytes Relative: 45 %
Lymphs Abs: 2.8 10*3/uL (ref 0.7–4.0)
MCH: 32.2 pg (ref 26.0–34.0)
MCHC: 33.4 g/dL (ref 30.0–36.0)
MCV: 96.2 fL (ref 80.0–100.0)
Monocytes Absolute: 0.6 10*3/uL (ref 0.1–1.0)
Monocytes Relative: 10 %
Neutro Abs: 1.8 10*3/uL (ref 1.7–7.7)
Neutrophils Relative %: 30 %
Platelet Count: 245 10*3/uL (ref 150–400)
RBC: 3.42 MIL/uL — ABNORMAL LOW (ref 3.87–5.11)
RDW: 14.5 % (ref 11.5–15.5)
WBC Count: 6.1 10*3/uL (ref 4.0–10.5)
nRBC: 0 % (ref 0.0–0.2)

## 2024-03-11 MED ORDER — PROCHLORPERAZINE MALEATE 10 MG PO TABS
10.0000 mg | ORAL_TABLET | Freq: Once | ORAL | Status: AC
  Administered 2024-03-11: 10 mg via ORAL
  Filled 2024-03-11: qty 1

## 2024-03-11 MED ORDER — SODIUM CHLORIDE 0.9 % IV SOLN
INTRAVENOUS | Status: DC
  Filled 2024-03-11: qty 250

## 2024-03-11 MED ORDER — HEPARIN SOD (PORK) LOCK FLUSH 100 UNIT/ML IV SOLN
500.0000 [IU] | Freq: Once | INTRAVENOUS | Status: AC | PRN
  Administered 2024-03-11: 500 [IU]
  Filled 2024-03-11: qty 5

## 2024-03-11 MED ORDER — SODIUM CHLORIDE 0.9 % IV SOLN
1.0000 mg/kg | Freq: Once | INTRAVENOUS | Status: AC
  Administered 2024-03-11: 50 mg via INTRAVENOUS
  Filled 2024-03-11: qty 3

## 2024-03-11 MED ORDER — SODIUM CHLORIDE 0.9 % IV SOLN
200.0000 mg | Freq: Once | INTRAVENOUS | Status: AC
  Administered 2024-03-11: 200 mg via INTRAVENOUS
  Filled 2024-03-11: qty 8

## 2024-03-11 NOTE — Patient Instructions (Signed)
 [facility] - A DEPT OF [facility]  Discharge Instructions: Thank you for choosing [facility] to provide your oncology and hematology care.  If you have a lab appointment with the Cancer Center, please go directly to the Cancer Center and check in at the registration area.  Wear comfortable clothing and clothing appropriate for easy access to any Portacath or PICC line.   We strive to give you quality time with your provider. You may need to reschedule your appointment if you arrive late (15 or more minutes).  Arriving late affects you and other patients whose appointments are after yours.  Also, if you miss three or more appointments without notifying the office, you may be dismissed from the clinic at the provider's discretion.      For prescription refill requests, have your pharmacy contact our office and allow 72 hours for refills to be completed.    Today you received the following chemotherapy and/or immunotherapy agent: enfortumab vedotin / pembrolizumab     To help prevent nausea and vomiting after your treatment, we encourage you to take your nausea medication as directed.  BELOW ARE SYMPTOMS THAT SHOULD BE REPORTED IMMEDIATELY: *FEVER GREATER THAN 100.4 F (38 C) OR HIGHER *CHILLS OR SWEATING *NAUSEA AND VOMITING THAT IS NOT CONTROLLED WITH YOUR NAUSEA MEDICATION *UNUSUAL SHORTNESS OF BREATH *UNUSUAL BRUISING OR BLEEDING *URINARY PROBLEMS (pain or burning when urinating, or frequent urination) *BOWEL PROBLEMS (unusual diarrhea, constipation, pain near the anus) TENDERNESS IN MOUTH AND THROAT WITH OR WITHOUT PRESENCE OF ULCERS (sore throat, sores in mouth, or a toothache) UNUSUAL RASH, SWELLING OR PAIN  UNUSUAL VAGINAL DISCHARGE OR ITCHING   Items with * indicate a potential emergency and should be followed up as soon as possible or go to the Emergency Department if any problems should occur.  Please show the CHEMOTHERAPY ALERT  CARD or IMMUNOTHERAPY ALERT CARD at check-in to the Emergency Department and triage nurse.  Should you have questions after your visit or need to cancel or reschedule your appointment, please contact [facility] - A DEPT OF [facility]  [phone] and follow the prompts.  Office hours are 8:00 a.m. to 4:30 p.m. Monday - Friday. Please note that voicemails left after 4:00 p.m. may not be returned until the following business day.  We are closed weekends and major holidays. You have access to a nurse at all times for urgent questions. Please call the main number to the clinic [phone] and follow the prompts.  For any non-urgent questions, you may also contact your provider using MyChart. We now offer e-Visits for anyone 67 and older to request care online for non-urgent symptoms. For details visit [email or web address].   Also download the MyChart app! Go to the app store, search "MyChart", open the app, select [place], and log in with your MyChart username and password.

## 2024-03-11 NOTE — Progress Notes (Signed)
 Pt had lt cataract surgery 03/09/24, Dr. Vallery Gavel in [place].  Appetite 50% normal, no supplements. She states food feels funny or rubbery x1.5 weeks. Having nausea, using the compazine .  C/o rash on overall body, itchy, and fatigue.

## 2024-03-11 NOTE — Progress Notes (Signed)
 [facility] CONSULT NOTE  Patient Care Team: Villa Greaser as PCP - General (Physician Assistant)  CHIEF COMPLAINTS/PURPOSE OF CONSULTATION: Bladder cancer  Oncology History Overview Note  # Metastatic urothelial carcinoma - Patient developed urinary symptoms around September or October 2023.  Was treated with multiple rounds of antibiotics with no improvement.  She was then referred to Dr. Ace Holder for further evaluation.   - s/p TURBT with left ureteral stent placement by Dr. Ace Holder.  IntraOp findings showed large irregular mucosal submucosal extensive tumor from the left dome, left lateral bladder wall majority of trigone including the left UO and bladder neck.  Measures at least 5 x 5 cm. Pathology showed invasive high-grade urothelial carcinoma. Invades muscularis propria.   - Completed neoadjuvant cycle 4-day 1 of split dose cisplatin  and gemcitabine  (07/23/2023). Pt declined C4D8 due to poor tolerance/nausea/flank pain.  Split dose cisplatin  considered for CKD stage III.   - s/p robotic adhesiolysis + peritoneal biopsies 09/25/23 showed urothelial cancer with peritoneal carcinomatosis and small + large bowel serosal involvement. Original plan was cystectomy and urinary diversion but this was aborted based on intra-op findings.   # JAN 14th, 2025- Start Pem+Erfo   Urothelial cancer (HCC)  04/12/2023 Initial Diagnosis   Urothelial cancer (HCC)   04/12/2023 Cancer Staging   Staging form: Kidney, AJCC 8th Edition - Clinical: Stage III (cT3, cN0, cM0) - Signed by Agrawal, Kavita, MD on 05/07/2023 Stage prefix: Initial diagnosis   09/25/2023 Cancer Staging   Staging form: Kidney, AJCC 8th Edition - Pathologic stage from 09/25/2023: Stage IV (pM1) - Signed by Loreatha Rodney, MD on 10/25/2023   Malignant neoplasm of urinary bladder (HCC)  04/22/2023 Initial Diagnosis   Malignant neoplasm of urinary bladder (HCC)   05/07/2023 - 07/23/2023 Chemotherapy   Patient is on  Treatment Plan : BLADDER Gemcitabine  D1,8 + Cisplatin  (split dose) D1,8 q21d x 4 cycles     11/05/2023 -  Chemotherapy   Patient is on Treatment Plan : UROTHELIAL ADVANCED, METASTATIC ENFORTUMAB D1, D8 + PEMBROLIZUMAB  (200) D1 Q21D       HISTORY OF PRESENTING ILLNESS: Patient ambulating-independently.   Alone.Barbara [name] 63 y.o.  female pleasant patient with stage IV/metastatic urothelial cancer-currently on pembrolizumab  plus Enfortumab is here for follow-up.  Patient interim had cataract surgery.  Vision improved.  No postoperative complications.  Patient continues eye moisturizing/drops advised.  Complaints of poor taste.  There is no weight loss.  Continues to have mild nausea, using the compazine .   C/o rash on overall body, itchy-overall stable on current regimen.   Complains of fatigue.  Review of Systems  Constitutional:  Positive for malaise/fatigue. Negative for chills, diaphoresis, fever and weight loss.  HENT:  Negative for nosebleeds and sore throat.   Eyes:  Negative for double vision.  Respiratory:  Negative for cough, hemoptysis, sputum production, shortness of breath and wheezing.   Cardiovascular:  Negative for chest pain, palpitations, orthopnea and leg swelling.  Gastrointestinal:  Negative for abdominal pain, blood in stool, constipation, diarrhea, heartburn, melena, nausea and vomiting.  Genitourinary:  Negative for dysuria, frequency and urgency.  Musculoskeletal:  Negative for back pain and joint pain.  Skin:  Positive for itching and rash.  Neurological:  Negative for dizziness, tingling, focal weakness, weakness and headaches.  Endo/Heme/Allergies:  Does not bruise/bleed easily.  Psychiatric/Behavioral:  Negative for depression. The patient is not nervous/anxious and does not have insomnia.     MEDICAL HISTORY:  Past Medical History:  Diagnosis Date   Adopted    Allergic rhinitis    Anemia    h/o   Anxiety    Arthritis    B12 deficiency     Bilateral carotid artery stenosis    Bilateral carotid bruits    Bladder tumor    Cancer (HCC)    bladder cancer   Depression    Gallbladder polyp    GERD (gastroesophageal reflux disease)    Heart murmur    Hydronephrosis of left kidney    Migraines    Tobacco abuse    UTI (urinary tract infection)     SURGICAL HISTORY: Past Surgical History:  Procedure Laterality Date   ABDOMINAL HYSTERECTOMY     BLADDER SURGERY     BREAST BIOPSY Left 2014   neg   CYSTOSCOPY W/ RETROGRADES Bilateral 01/29/2024   Procedure: CYSTOSCOPY, WITH RETROGRADE PYELOGRAM;  Surgeon: Melody Spurling., MD;  Location: [facility];  Service: Urology;  Laterality: Bilateral;   CYSTOSCOPY W/ URETERAL STENT PLACEMENT Left 04/04/2023   Procedure: CYSTOSCOPY WITH RETROGRADE PYELOGRAM/URETERAL STENT PLACEMENT;  Surgeon: Dustin Gimenez, MD;  Location: [facility];  Service: Urology;  Laterality: Left;   CYSTOSCOPY WITH STENT PLACEMENT Left 01/29/2024   Procedure: CYSTOSCOPY, WITH STENT INSERTION;  Surgeon: Melody Spurling., MD;  Location: [facility];  Service: Urology;  Laterality: Left;   FOOT SURGERY Right    2009   IR IMAGING GUIDED PORT INSERTION  05/03/2023   LAPAROSCOPIC APPENDECTOMY  12/05/2012   NASAL SEPTUM SURGERY     ROBOT ASSISTED LAPAROSCOPIC COMPLETE CYSTECT ILEAL CONDUIT N/A 09/25/2023   Procedure: XI ROBOTIC ASSISTED LAPAROSCOPIC EXTENSIVE LYSIS OF ADHESIONS, PERITONEAL BIOPSIES;  Surgeon: Melody Spurling., MD;  Location: [facility];  Service: Urology;  Laterality: N/A;   SISTRUNK PROCEDURE  03/03/2012   Thyroglossal duct cyst excision   TONSILLECTOMY     TRANSURETHRAL RESECTION OF BLADDER TUMOR N/A 04/04/2023   Procedure: TRANSURETHRAL RESECTION OF BLADDER TUMOR (TURBT);  Surgeon: Dustin Gimenez, MD;  Location: [facility];  Service: Urology;  Laterality: N/A;    SOCIAL HISTORY: Social History   Socioeconomic History   Marital status: Married    Spouse name: Not on file   Number of children: Not on  file   Years of education: Not on file   Highest education level: Not on file  Occupational History   Not on file  Tobacco Use   Smoking status: Former    Current packs/day: 0.25    Average packs/day: 0.3 packs/day for 30.0 years (7.5 ttl pk-yrs)    Types: Cigarettes    Passive exposure: Past   Smokeless tobacco: Never  Vaping Use   Vaping status: Never Used  Substance and Sexual Activity   Alcohol  use: Never   Drug use: Never   Sexual activity: Not on file  Other Topics Concern   Not on file  Social History Narrative   Not on file   Social Drivers of Health   Financial Resource Strain: Not on file  Food Insecurity: No Food Insecurity (09/25/2023)   Hunger Vital Sign    Worried About Running Out of Food in the Last Year: Never true    Ran Out of Food in the Last Year: Never true  Transportation Needs: No Transportation Needs (09/25/2023)   PRAPARE - Administrator, Civil Service (Medical): No    Lack of Transportation (Non-Medical): No  Physical Activity: Not on file  Stress: Not on file  Social Connections: Not  on file  Intimate Partner Violence: Not At Risk (09/25/2023)   Humiliation, Afraid, Rape, and Kick questionnaire    Fear of Current or Ex-Partner: No    Emotionally Abused: No    Physically Abused: No    Sexually Abused: No    FAMILY HISTORY: Family History  Adopted: Yes    ALLERGIES:  is allergic to pseudoephedrine hcl.  MEDICATIONS:  Current Outpatient Medications  Medication Sig Dispense Refill   acetaminophen  (TYLENOL ) 500 MG tablet Take 1,000 mg by mouth every 6 (six) hours as needed for moderate pain (pain score 4-6).     carboxymethylcellulose (REFRESH PLUS) 0.5 % SOLN Place 1 drop into both eyes 3 (three) times daily as needed (dry eyes).     escitalopram  (LEXAPRO ) 5 MG tablet TAKE 1 TABLET BY MOUTH DAILY 90 tablet 1   estradiol  (ESTRACE ) 1 MG tablet TAKE 1 TABLET BY MOUTH DAILY 90 tablet 3   famotidine  (PEPCID ) 10 MG tablet Take 10  mg by mouth 2 (two) times daily.     lidocaine -prilocaine  (EMLA ) cream Apply 1 Application topically as needed. 30 g 0   montelukast  (SINGULAIR ) 10 MG tablet Take 1 tablet (10 mg total) by mouth at bedtime. As needed if breaks out into rash (Patient taking differently: Take 10 mg by mouth at bedtime.) 90 tablet 1   ondansetron  (ZOFRAN ) 8 MG tablet Take 1 tablet (8 mg total) by mouth every 8 (eight) hours as needed for up to 30 doses for nausea or vomiting. 30 tablet 0   prochlorperazine  (COMPAZINE ) 10 MG tablet Take 1 tablet (10 mg total) by mouth every 6 (six) hours as needed for nausea or vomiting. 30 tablet 0   topiramate  (TOPAMAX ) 50 MG tablet TAKE 2 TABLETS BY MOUTH AT  BEDTIME 180 tablet 3   traMADol  (ULTRAM ) 50 MG tablet Take 1-2 tablets (50-100 mg total) by mouth every 6 (six) hours as needed for moderate pain (pain score 4-6) or severe pain (pain score 7-10) (from bladder cancer). 30 tablet 0   triamcinolone  ointment (KENALOG ) 0.5 % Apply 1 Application topically 2 (two) times daily as needed (on flared up areas of arms and legs). Do not apply on face, armpit and groin. 30 g 0   carisoprodol  (SOMA ) 350 MG tablet Take 1 tablet (350 mg total) by mouth at bedtime as needed for muscle spasms. (Patient not taking: Reported on 01/22/2024) 90 tablet 0   dicyclomine  (BENTYL ) 10 MG capsule Take 1 capsule (10 mg total) by mouth 3 (three) times daily as needed for up to 30 doses for spasms. (Patient not taking: Reported on 01/21/2024) 30 capsule 0   oxybutynin  (DITROPAN ) 5 MG tablet Take 1 tablet (5 mg total) by mouth every 8 (eight) hours as needed for bladder spasms. (Patient not taking: Reported on 03/11/2024) 30 tablet 0   No current facility-administered medications for this visit.   Facility-Administered Medications Ordered in Other Visits  Medication Dose Route Frequency Provider Last Rate Last Admin   0.9 %  sodium chloride  infusion   Intravenous Continuous Agrawal, Kavita, MD 10 mL/hr at 03/11/24  0909 New Bag at 03/11/24 0909   enfortumab vedotin -ejfv (PADCEV ) 50 mg in sodium chloride  0.9 % 50 mL (0.9091 mg/mL) chemo infusion  1 mg/kg (Treatment Plan Recorded) Intravenous Once Agrawal, Kavita, MD       heparin  lock flush 100 unit/mL  500 Units Intravenous Once Agrawal, Kavita, MD       heparin  lock flush 100 unit/mL  500 Units Intravenous Once  Agrawal, Kavita, MD       heparin  lock flush 100 unit/mL  500 Units Intravenous Once Agrawal, Kavita, MD       heparin  lock flush 100 unit/mL  500 Units Intracatheter Once PRN Agrawal, Kavita, MD       pembrolizumab  (KEYTRUDA ) 200 mg in sodium chloride  0.9 % 50 mL chemo infusion  200 mg Intravenous Once Agrawal, Kavita, MD       sodium chloride  flush (NS) 0.9 % injection 10 mL  10 mL Intravenous Once Agrawal, Kavita, MD       sodium chloride  flush (NS) 0.9 % injection 10 mL  10 mL Intravenous Once Agrawal, Kavita, MD       sodium chloride  flush (NS) 0.9 % injection 10 mL  10 mL Intravenous Once Agrawal, Kavita, MD        PHYSICAL EXAMINATION:   Vitals:   03/11/24 0810  BP: 113/73  Pulse: 62  Resp: 16  Temp: (!) 97.1 F (36.2 C)  SpO2: 100%   Filed Weights   03/11/24 0810  Weight: 111 lb 11.2 oz (50.7 kg)    Physical Exam Vitals and nursing note reviewed.  HENT:     Head: Normocephalic and atraumatic.     Mouth/Throat:     Pharynx: Oropharynx is clear.  Eyes:     Extraocular Movements: Extraocular movements intact.     Pupils: Pupils are equal, round, and reactive to light.  Cardiovascular:     Rate and Rhythm: Normal rate and regular rhythm.  Pulmonary:     Comments: Decreased breath sounds bilaterally.  Abdominal:     Palpations: Abdomen is soft.  Musculoskeletal:        General: Normal range of motion.     Cervical back: Normal range of motion.  Skin:    General: Skin is warm.  Neurological:     General: No focal deficit present.     Mental Status: She is alert and oriented to person, place, and time.  Psychiatric:         Behavior: Behavior normal.        Judgment: Judgment normal.     LABORATORY DATA:  I have reviewed the data as listed Lab Results  Component Value Date   WBC 6.1 03/11/2024   HGB 11.0 (L) 03/11/2024   HCT 32.9 (L) 03/11/2024   MCV 96.2 03/11/2024   PLT 245 03/11/2024   Recent Labs    02/18/24 0907 02/25/24 0844 03/11/24 0803  NA 136 137 137  K 3.3* 3.3* 3.6  CL 106 106 107  CO2 23 23 25   GLUCOSE 101* 104* 83  BUN 23 19 22   CREATININE 1.19* 1.26* 1.13*  CALCIUM  8.5* 8.4* 8.6*  GFRNONAA 52* 48* 55*  PROT 7.2 6.9 7.1  ALBUMIN 3.7 3.4* 3.5  AST 22 23 18   ALT 10 10 9   ALKPHOS 48 46 43  BILITOT 0.4 0.3 0.3    RADIOGRAPHIC STUDIES: I have personally reviewed the radiological images as listed and agreed with the findings in the report. No results found.   Urothelial cancer [facility]) # Stage/metastatic urothelial cancer; [facility] testing was sent on peritoneal biopsies - no targets currently on Pembro+ Erfortumab- MARCH 17th, 2025- CT chest abdomen pelvis reviewed from March 2025 showed positive response to the treatment.  Imaging was also discussed in tumor conference.  No evidence of any disease noted.    # Proceed with Pem+Enfor 1mg /kg- day-8 today. Labs-CBC/chemistries were reviewed with the patient.  Patient tolerating well except  for skin rash see below.  Will plan repeat imaging due mid July 2025.  # Renal/Hypokalemia- -Potassium 3.3.  Declined oral and IV potassium supplements; supp- potassium rich food.  Continue monitoring.  CKD stage III stable.   # Weight loss: -Continue to follow with nutrition.  Stable weight.  Defer to consider adding Zyprexa.   # Maculopapular rash: Likely secondary to enfortumab vedotin - Initially, started with grade 3 involving more than 30% BSA with severe pruritus, mild fever.  Significantly improved after dose reduction. Continue with supportive care-Claritin, Aveeno moisturizer and Singulair ; -Recently, she has been having intermittent  flareups on the arm and leg.  Sent Kenalog  0.5% twice daily as needed to the affected areas.  Avoid use on face, groin and axilla- stable.    # Hematuria: shronic likely due to bladder mass.  Monitor for now stable.   # Moderate left hydroureteronephrosis s/p left ureteral stent- Left ureteral stent last replaced on 01/30/24.  Continue follow-up with Dr. Secundino Dach.stable.   # Acid reflux/gas/bloating-advised to take OTC simethicone, Tums, Pepcid  as needed.stable  # Fatigue: # Fatigue: Patient has no other identifiable causes of fatigue at this time.  I had a long discussion with the patient regarding the ability of structured exercise program help treat cancer related fatigue.  Introduced the care program available to cancer patients at [facility]. Patient is interested, but wants to think about the program- consider to care program: re: Fatigue   # IV Access-port placed on 05/03/2023- functional.   # DISPOSITION: # chemo today # cancel D-8 APP appt/next week; keep labs-infusion appt # Follow up in 3 weeks- MD: labs- cbc/cmp; TSH- chemo-  # in 4 weeks- labs- cbc/bmp; chemo- Dr.B      Above plan of care was discussed with patient/family in detail.  My contact information was given to the patient/family.       Gwyn Leos, MD 03/11/2024 9:35 AM

## 2024-03-11 NOTE — Assessment & Plan Note (Addendum)
#   Stage/metastatic urothelial cancer; [facility] testing was sent on peritoneal biopsies - no targets currently on Pembro+ Erfortumab- MARCH 17th, 2025- CT chest abdomen pelvis reviewed from March 2025 showed positive response to the treatment.  Imaging was also discussed in tumor conference.  No evidence of any disease noted.    # Proceed with Pem+Enfor 1mg /kg- day-8 today. Labs-CBC/chemistries were reviewed with the patient.  Patient tolerating well except for skin rash see below.  Will plan repeat imaging due mid July 2025.  # Renal/Hypokalemia- -Potassium 3.3.  Declined oral and IV potassium supplements; supp- potassium rich food.  Continue monitoring.  CKD stage III stable.   # Weight loss: -Continue to follow with nutrition.  Stable weight.  Defer to consider adding Zyprexa.   # Maculopapular rash: Likely secondary to enfortumab vedotin - Initially, started with grade 3 involving more than 30% BSA with severe pruritus, mild fever.  Significantly improved after dose reduction. Continue with supportive care-Claritin, Aveeno moisturizer and Singulair ; -Recently, she has been having intermittent flareups on the arm and leg.  Sent Kenalog  0.5% twice daily as needed to the affected areas.  Avoid use on face, groin and axilla- stable.    # Hematuria: shronic likely due to bladder mass.  Monitor for now stable.   # Moderate left hydroureteronephrosis s/p left ureteral stent- Left ureteral stent last replaced on 01/30/24.  Continue follow-up with Dr. Secundino Dach.stable.   # Acid reflux/gas/bloating-advised to take OTC simethicone, Tums, Pepcid  as needed.stable  # Fatigue: # Fatigue: Patient has no other identifiable causes of fatigue at this time.  I had a long discussion with the patient regarding the ability of structured exercise program help treat cancer related fatigue.  Introduced the care program available to cancer patients at [facility]. Patient is interested, but wants to think about the program- consider to  care program: re: Fatigue   # IV Access-port placed on 05/03/2023- functional.   # DISPOSITION: # chemo today # cancel D-8 APP appt/next week; keep labs-infusion appt # Follow up in 3 weeks- MD: labs- cbc/cmp; TSH- chemo-  # in 4 weeks- labs- cbc/bmp; chemo- Dr.B

## 2024-03-17 ENCOUNTER — Inpatient Hospital Stay: Admitting: Nurse Practitioner

## 2024-03-17 ENCOUNTER — Inpatient Hospital Stay

## 2024-03-17 VITALS — BP 124/73 | HR 71 | Temp 96.0°F | Resp 16 | Wt 109.3 lb

## 2024-03-17 DIAGNOSIS — C679 Malignant neoplasm of bladder, unspecified: Secondary | ICD-10-CM

## 2024-03-17 DIAGNOSIS — Z5112 Encounter for antineoplastic immunotherapy: Secondary | ICD-10-CM | POA: Diagnosis not present

## 2024-03-17 LAB — CMP (CANCER CENTER ONLY)
ALT: 9 U/L (ref 0–44)
AST: 19 U/L (ref 15–41)
Albumin: 3.9 g/dL (ref 3.5–5.0)
Alkaline Phosphatase: 52 U/L (ref 38–126)
Anion gap: 8 (ref 5–15)
BUN: 23 mg/dL (ref 8–23)
CO2: 23 mmol/L (ref 22–32)
Calcium: 8.6 mg/dL — ABNORMAL LOW (ref 8.9–10.3)
Chloride: 105 mmol/L (ref 98–111)
Creatinine: 1.32 mg/dL — ABNORMAL HIGH (ref 0.44–1.00)
GFR, Estimated: 45 mL/min — ABNORMAL LOW (ref >60)
Glucose, Bld: 87 mg/dL (ref 70–99)
Potassium: 3.5 mmol/L (ref 3.5–5.1)
Sodium: 136 mmol/L (ref 135–145)
Total Bilirubin: 0.7 mg/dL (ref 0.0–1.2)
Total Protein: 7.6 g/dL (ref 6.5–8.1)

## 2024-03-17 LAB — CBC WITH DIFFERENTIAL (CANCER CENTER ONLY)
Abs Immature Granulocytes: 0.02 10*3/uL (ref 0.00–0.07)
Basophils Absolute: 0 10*3/uL (ref 0.0–0.1)
Basophils Relative: 1 %
Eosinophils Absolute: 0.6 10*3/uL — ABNORMAL HIGH (ref 0.0–0.5)
Eosinophils Relative: 10 %
HCT: 36.3 % (ref 36.0–46.0)
Hemoglobin: 11.9 g/dL — ABNORMAL LOW (ref 12.0–15.0)
Immature Granulocytes: 0 %
Lymphocytes Relative: 42 %
Lymphs Abs: 2.5 10*3/uL (ref 0.7–4.0)
MCH: 31.3 pg (ref 26.0–34.0)
MCHC: 32.8 g/dL (ref 30.0–36.0)
MCV: 95.5 fL (ref 80.0–100.0)
Monocytes Absolute: 0.7 10*3/uL (ref 0.1–1.0)
Monocytes Relative: 11 %
Neutro Abs: 2.1 10*3/uL (ref 1.7–7.7)
Neutrophils Relative %: 36 %
Platelet Count: 220 10*3/uL (ref 150–400)
RBC: 3.8 MIL/uL — ABNORMAL LOW (ref 3.87–5.11)
RDW: 14.2 % (ref 11.5–15.5)
WBC Count: 5.9 10*3/uL (ref 4.0–10.5)
nRBC: 0 % (ref 0.0–0.2)

## 2024-03-17 MED ORDER — PROCHLORPERAZINE MALEATE 10 MG PO TABS
10.0000 mg | ORAL_TABLET | Freq: Once | ORAL | Status: AC
  Administered 2024-03-17: 10 mg via ORAL
  Filled 2024-03-17: qty 1

## 2024-03-17 MED ORDER — SODIUM CHLORIDE 0.9 % IV SOLN
INTRAVENOUS | Status: DC
  Filled 2024-03-17: qty 250

## 2024-03-17 MED ORDER — SODIUM CHLORIDE 0.9 % IV SOLN
1.0000 mg/kg | Freq: Once | INTRAVENOUS | Status: AC
  Administered 2024-03-17: 50 mg via INTRAVENOUS
  Filled 2024-03-17: qty 3

## 2024-03-17 MED ORDER — SODIUM CHLORIDE 0.9% FLUSH
10.0000 mL | INTRAVENOUS | Status: DC | PRN
  Filled 2024-03-17: qty 10

## 2024-03-17 MED ORDER — HEPARIN SOD (PORK) LOCK FLUSH 100 UNIT/ML IV SOLN
500.0000 [IU] | Freq: Once | INTRAVENOUS | Status: DC | PRN
Start: 2024-03-17 — End: 2024-03-17
  Filled 2024-03-17: qty 5

## 2024-03-24 ENCOUNTER — Encounter: Payer: Self-pay | Admitting: Internal Medicine

## 2024-03-27 ENCOUNTER — Other Ambulatory Visit: Payer: Self-pay | Admitting: *Deleted

## 2024-03-27 DIAGNOSIS — C679 Malignant neoplasm of bladder, unspecified: Secondary | ICD-10-CM

## 2024-03-30 ENCOUNTER — Inpatient Hospital Stay

## 2024-03-30 ENCOUNTER — Inpatient Hospital Stay: Admitting: Internal Medicine

## 2024-03-30 ENCOUNTER — Telehealth: Payer: Self-pay | Admitting: Internal Medicine

## 2024-03-30 NOTE — Telephone Encounter (Signed)
 I called her to check on her since she was late for her appointment. She said she doesn't have insurance and can not come to it. Her insurance wont be reinstated until July 1. She asked to be able to call back to set up appointments then.   Appointments cancelled as requested.

## 2024-04-07 ENCOUNTER — Inpatient Hospital Stay

## 2024-04-08 ENCOUNTER — Encounter: Payer: Self-pay | Admitting: Internal Medicine

## 2024-04-08 ENCOUNTER — Other Ambulatory Visit: Payer: Self-pay | Admitting: Internal Medicine

## 2024-04-08 NOTE — Progress Notes (Signed)
 Patient will call us  back when she is ready to be scheduled for the treatments.  Treatments on hold

## 2024-04-09 ENCOUNTER — Other Ambulatory Visit: Payer: Self-pay

## 2024-04-09 ENCOUNTER — Telehealth: Payer: Self-pay | Admitting: Internal Medicine

## 2024-04-09 NOTE — Telephone Encounter (Signed)
 She is active on insurance. She would like to go ahead on 7/1 and have treatment with the 2nd part of it on 7/8. She is asking for the appointments to be scheduled and she will see them on mychart.   Please advise scheduling.   Thank you

## 2024-04-10 ENCOUNTER — Encounter: Payer: Self-pay | Admitting: Internal Medicine

## 2024-04-10 ENCOUNTER — Encounter: Payer: Self-pay | Admitting: Nurse Practitioner

## 2024-04-14 ENCOUNTER — Other Ambulatory Visit: Payer: Self-pay | Admitting: Internal Medicine

## 2024-04-14 ENCOUNTER — Encounter: Payer: Self-pay | Admitting: Internal Medicine

## 2024-04-14 ENCOUNTER — Encounter: Payer: Self-pay | Admitting: Nurse Practitioner

## 2024-04-14 DIAGNOSIS — C679 Malignant neoplasm of bladder, unspecified: Secondary | ICD-10-CM

## 2024-04-16 ENCOUNTER — Other Ambulatory Visit: Payer: Self-pay

## 2024-04-17 ENCOUNTER — Ambulatory Visit: Payer: Managed Care, Other (non HMO) | Admitting: Physician Assistant

## 2024-04-17 ENCOUNTER — Encounter: Payer: Self-pay | Admitting: Physician Assistant

## 2024-04-17 VITALS — BP 115/70 | HR 89 | Temp 97.9°F | Resp 16 | Ht 65.5 in | Wt 109.0 lb

## 2024-04-17 DIAGNOSIS — C679 Malignant neoplasm of bladder, unspecified: Secondary | ICD-10-CM

## 2024-04-17 DIAGNOSIS — M62838 Other muscle spasm: Secondary | ICD-10-CM | POA: Diagnosis not present

## 2024-04-17 DIAGNOSIS — F411 Generalized anxiety disorder: Secondary | ICD-10-CM | POA: Diagnosis not present

## 2024-04-17 MED ORDER — ESCITALOPRAM OXALATE 5 MG PO TABS: 5.0000 mg | ORAL_TABLET | Freq: Every day | ORAL | 1 refills | Status: DC

## 2024-04-17 MED ORDER — CARISOPRODOL 350 MG PO TABS: 350.0000 mg | ORAL_TABLET | Freq: Every evening | ORAL | 0 refills | Status: DC | PRN

## 2024-04-17 NOTE — Progress Notes (Signed)
 [facility] [address] [place], [place] [address]  Internal MEDICINE  Office Visit Note  Patient Name: Barbara [name]  [id number]  [id number]  Date of Service: 04/17/2024  Chief Complaint  Patient presents with   Follow-up   Depression   Gastroesophageal Reflux   Quality Metric Gaps    Colonoscopy    HPI Pt is here for routine follow up -currently undergoing treatment for bladder cancer, last infusion in May, will have another July 1, then another scan in July as well. States last scan showed no progression and was stable. Had a gap in therapy due to insurance change when she went on LTD. Gets a rash with therapy. -does have fatigue due to this -pt reports she wants to think about colonoscopy, she was due as of Dec -also due for pap and wants to plan for this at CPE this dec, she had partial hysterectomy and believes her cervix is still present -she has labs with oncology -needs muscle relaxer refilled as she does get back/neck spasms and takes this as needed -continues to do well with lexapro   Current Medication: Outpatient Encounter Medications as of 04/17/2024  Medication Sig   acetaminophen  (TYLENOL ) 500 MG tablet Take 1,000 mg by mouth every 6 (six) hours as needed for moderate pain (pain score 4-6).   carboxymethylcellulose (REFRESH PLUS) 0.5 % SOLN Place 1 drop into both eyes 3 (three) times daily as needed (dry eyes).   dicyclomine  (BENTYL ) 10 MG capsule Take 1 capsule (10 mg total) by mouth 3 (three) times daily as needed for up to 30 doses for spasms.   estradiol  (ESTRACE ) 1 MG tablet TAKE 1 TABLET BY MOUTH DAILY   famotidine  (PEPCID ) 10 MG tablet Take 10 mg by mouth 2 (two) times daily.   lidocaine -prilocaine  (EMLA ) cream Apply 1 Application topically as needed.   montelukast  (SINGULAIR ) 10 MG tablet Take 1 tablet (10 mg total) by mouth at bedtime. As needed if breaks out into rash (Patient taking differently: Take 10 mg by mouth at bedtime.)    ondansetron  (ZOFRAN ) 8 MG tablet Take 1 tablet (8 mg total) by mouth every 8 (eight) hours as needed for up to 30 doses for nausea or vomiting.   prochlorperazine  (COMPAZINE ) 10 MG tablet Take 1 tablet (10 mg total) by mouth every 6 (six) hours as needed for nausea or vomiting.   topiramate  (TOPAMAX ) 50 MG tablet TAKE 2 TABLETS BY MOUTH AT  BEDTIME   traMADol  (ULTRAM ) 50 MG tablet Take 1-2 tablets (50-100 mg total) by mouth every 6 (six) hours as needed for moderate pain (pain score 4-6) or severe pain (pain score 7-10) (from bladder cancer).   triamcinolone  ointment (KENALOG ) 0.5 % Apply 1 Application topically 2 (two) times daily as needed (on flared up areas of arms and legs). Do not apply on face, armpit and groin.   [DISCONTINUED] carisoprodol  (SOMA ) 350 MG tablet Take 1 tablet (350 mg total) by mouth at bedtime as needed for muscle spasms.   [DISCONTINUED] escitalopram  (LEXAPRO ) 5 MG tablet TAKE 1 TABLET BY MOUTH DAILY   [DISCONTINUED] oxybutynin  (DITROPAN ) 5 MG tablet Take 1 tablet (5 mg total) by mouth every 8 (eight) hours as needed for bladder spasms.   carisoprodol  (SOMA ) 350 MG tablet Take 1 tablet (350 mg total) by mouth at bedtime as needed for muscle spasms.   escitalopram  (LEXAPRO ) 5 MG tablet Take 1 tablet (5 mg total) by mouth daily.   Facility-Administered Encounter Medications as of 04/17/2024  Medication  heparin  lock flush 100 unit/mL   heparin  lock flush 100 unit/mL   heparin  lock flush 100 unit/mL   sodium chloride  flush (NS) 0.9 % injection 10 mL   sodium chloride  flush (NS) 0.9 % injection 10 mL   sodium chloride  flush (NS) 0.9 % injection 10 mL    Surgical History: Past Surgical History:  Procedure Laterality Date   ABDOMINAL HYSTERECTOMY     BLADDER SURGERY     BREAST BIOPSY Left 2014   neg   CYSTOSCOPY W/ RETROGRADES Bilateral 01/29/2024   Procedure: CYSTOSCOPY, WITH RETROGRADE PYELOGRAM;  Surgeon: Alvaro Ricardo KATHEE Mickey., MD;  Location: [facility];  Service:  Urology;  Laterality: Bilateral;   CYSTOSCOPY W/ URETERAL STENT PLACEMENT Left 04/04/2023   Procedure: CYSTOSCOPY WITH RETROGRADE PYELOGRAM/URETERAL STENT PLACEMENT;  Surgeon: Penne Knee, MD;  Location: [facility];  Service: Urology;  Laterality: Left;   CYSTOSCOPY WITH STENT PLACEMENT Left 01/29/2024   Procedure: CYSTOSCOPY, WITH STENT INSERTION;  Surgeon: Alvaro Ricardo KATHEE Mickey., MD;  Location: [facility];  Service: Urology;  Laterality: Left;   FOOT SURGERY Right    2009   IR IMAGING GUIDED PORT INSERTION  05/03/2023   LAPAROSCOPIC APPENDECTOMY  12/05/2012   NASAL SEPTUM SURGERY     ROBOT ASSISTED LAPAROSCOPIC COMPLETE CYSTECT ILEAL CONDUIT N/A 09/25/2023   Procedure: XI ROBOTIC ASSISTED LAPAROSCOPIC EXTENSIVE LYSIS OF ADHESIONS, PERITONEAL BIOPSIES;  Surgeon: Alvaro Ricardo KATHEE Mickey., MD;  Location: [facility];  Service: Urology;  Laterality: N/A;   SISTRUNK PROCEDURE  03/03/2012   Thyroglossal duct cyst excision   TONSILLECTOMY     TRANSURETHRAL RESECTION OF BLADDER TUMOR N/A 04/04/2023   Procedure: TRANSURETHRAL RESECTION OF BLADDER TUMOR (TURBT);  Surgeon: Penne Knee, MD;  Location: [facility];  Service: Urology;  Laterality: N/A;    Medical History: Past Medical History:  Diagnosis Date   Adopted    Allergic rhinitis    Anemia    h/o   Anxiety    Arthritis    B12 deficiency    Bilateral carotid artery stenosis    Bilateral carotid bruits    Bladder tumor    Cancer (HCC)    bladder cancer   Depression    Gallbladder polyp    GERD (gastroesophageal reflux disease)    Heart murmur    Hydronephrosis of left kidney    Migraines    Tobacco abuse    UTI (urinary tract infection)     Family History: Family History  Adopted: Yes    Social History   Socioeconomic History   Marital status: Married    Spouse name: Not on file   Number of children: Not on file   Years of education: Not on file   Highest education level: Not on file  Occupational History   Not on file  Tobacco  Use   Smoking status: Former    Current packs/day: 0.25    Average packs/day: 0.3 packs/day for 30.0 years (7.5 ttl pk-yrs)    Types: Cigarettes    Passive exposure: Past   Smokeless tobacco: Never  Vaping Use   Vaping status: Never Used  Substance and Sexual Activity   Alcohol  use: Never   Drug use: Never   Sexual activity: Not on file  Other Topics Concern   Not on file  Social History Narrative   Not on file   Social Drivers of Health   Financial Resource Strain: Not on file  Food Insecurity: No Food Insecurity (09/25/2023)   Hunger Vital Sign  Worried About Programme researcher, broadcasting/film/video in the Last Year: Never true    Ran Out of Food in the Last Year: Never true  Transportation Needs: No Transportation Needs (09/25/2023)   PRAPARE - Administrator, Civil Service (Medical): No    Lack of Transportation (Non-Medical): No  Physical Activity: Not on file  Stress: Not on file  Social Connections: Not on file  Intimate Partner Violence: Not At Risk (09/25/2023)   Humiliation, Afraid, Rape, and Kick questionnaire    Fear of Current or Ex-Partner: No    Emotionally Abused: No    Physically Abused: No    Sexually Abused: No      Review of Systems  Constitutional:  Positive for fatigue. Negative for chills and unexpected weight change.  HENT:  Negative for congestion, rhinorrhea, sneezing and sore throat.   Eyes:  Negative for redness.  Respiratory:  Negative for cough, chest tightness and shortness of breath.   Cardiovascular:  Negative for chest pain and palpitations.  Gastrointestinal:  Negative for abdominal pain, constipation, diarrhea, nausea and vomiting.  Genitourinary:  Negative for frequency.       Urinary incontinence  Musculoskeletal:  Negative for back pain, joint swelling and neck pain.  Skin:  Negative for wound.  Neurological: Negative.  Negative for tremors and numbness.  Hematological:  Negative for adenopathy. Does not bruise/bleed easily.   Psychiatric/Behavioral:  Negative for behavioral problems (Depression), sleep disturbance and suicidal ideas.     Vital Signs: BP 115/70   Pulse 89   Temp 97.9 F (36.6 C)   Resp 16   Ht 5' 5.5 (1.664 m)   Wt 109 lb (49.4 kg)   SpO2 99%   BMI 17.86 kg/m    Physical Exam Vitals and nursing note reviewed.  Constitutional:      General: She is not in acute distress.    Appearance: Normal appearance. She is well-developed and normal weight. She is not diaphoretic.  HENT:     Head: Normocephalic and atraumatic.   Eyes:     Extraocular Movements: Extraocular movements intact.   Neck:     Thyroid : No thyromegaly.     Vascular: No JVD.     Trachea: No tracheal deviation.   Cardiovascular:     Rate and Rhythm: Normal rate and regular rhythm.     Heart sounds: Normal heart sounds. No murmur heard.    No friction rub. No gallop.  Pulmonary:     Effort: Pulmonary effort is normal. No respiratory distress.     Breath sounds: No wheezing or rales.  Chest:     Chest wall: No tenderness.  Abdominal:     General: Bowel sounds are normal.   Musculoskeletal:        General: Normal range of motion.   Skin:    General: Skin is warm and dry.   Neurological:     Mental Status: She is alert and oriented to person, place, and time.   Psychiatric:        Behavior: Behavior normal.        Thought Content: Thought content normal.        Judgment: Judgment normal.        Assessment/Plan: 1. Muscle spasms of neck (Primary) May continue soma  as needed - carisoprodol  (SOMA ) 350 MG tablet; Take 1 tablet (350 mg total) by mouth at bedtime as needed for muscle spasms.  Dispense: 90 tablet; Refill: 0  2. GAD (generalized anxiety disorder) Continue lexapro  as  before  3. Malignant neoplasm of urinary bladder, unspecified site [facility]) Followed by oncology and urology   General Counseling: Kathelyn verbalizes understanding of the findings of todays visit and agrees with plan of  treatment. I have discussed any further diagnostic evaluation that may be needed or ordered today. We also reviewed her medications today. she has been encouraged to call the office with any questions or concerns that should arise related to todays visit.    No orders of the defined types were placed in this encounter.   Meds ordered this encounter  Medications   carisoprodol  (SOMA ) 350 MG tablet    Sig: Take 1 tablet (350 mg total) by mouth at bedtime as needed for muscle spasms.    Dispense:  90 tablet    Refill:  0    Fill new script today   escitalopram  (LEXAPRO ) 5 MG tablet    Sig: Take 1 tablet (5 mg total) by mouth daily.    Dispense:  90 tablet    Refill:  1    Please send a replace/new response with 90-Day Supply if appropriate to maximize member benefit. Requesting 1 year supply.    This patient was seen by Tinnie Pro, PA-C in collaboration with Dr. Sigrid Bathe as a part of collaborative care agreement.   Total time spent:30 Minutes Time spent includes review of chart, medications, test results, and follow up plan with the patient.      Dr Fozia M Khan Internal medicine

## 2024-04-18 ENCOUNTER — Other Ambulatory Visit: Payer: Self-pay

## 2024-04-20 ENCOUNTER — Encounter: Payer: Self-pay | Admitting: Internal Medicine

## 2024-04-21 ENCOUNTER — Inpatient Hospital Stay: Admitting: Internal Medicine

## 2024-04-21 ENCOUNTER — Ambulatory Visit: Admitting: Internal Medicine

## 2024-04-21 ENCOUNTER — Encounter: Payer: Self-pay | Admitting: Internal Medicine

## 2024-04-21 ENCOUNTER — Other Ambulatory Visit

## 2024-04-21 ENCOUNTER — Inpatient Hospital Stay: Attending: Internal Medicine

## 2024-04-21 ENCOUNTER — Ambulatory Visit: Payer: Self-pay | Admitting: Internal Medicine

## 2024-04-21 ENCOUNTER — Other Ambulatory Visit: Payer: Self-pay | Admitting: Internal Medicine

## 2024-04-21 ENCOUNTER — Inpatient Hospital Stay

## 2024-04-21 VITALS — BP 105/63 | HR 58

## 2024-04-21 DIAGNOSIS — Z5112 Encounter for antineoplastic immunotherapy: Secondary | ICD-10-CM | POA: Insufficient documentation

## 2024-04-21 DIAGNOSIS — C679 Malignant neoplasm of bladder, unspecified: Secondary | ICD-10-CM

## 2024-04-21 DIAGNOSIS — Z79620 Long term (current) use of immunosuppressive biologic: Secondary | ICD-10-CM | POA: Diagnosis not present

## 2024-04-21 LAB — CMP (CANCER CENTER ONLY)
ALT: 9 U/L (ref 0–44)
AST: 19 U/L (ref 15–41)
Albumin: 3.8 g/dL (ref 3.5–5.0)
Alkaline Phosphatase: 50 U/L (ref 38–126)
Anion gap: 5 (ref 5–15)
BUN: 37 mg/dL — ABNORMAL HIGH (ref 8–23)
CO2: 23 mmol/L (ref 22–32)
Calcium: 8.7 mg/dL — ABNORMAL LOW (ref 8.9–10.3)
Chloride: 109 mmol/L (ref 98–111)
Creatinine: 1.64 mg/dL — ABNORMAL HIGH (ref 0.44–1.00)
GFR, Estimated: 35 mL/min — ABNORMAL LOW (ref >60)
Glucose, Bld: 84 mg/dL (ref 70–99)
Potassium: 3.5 mmol/L (ref 3.5–5.1)
Sodium: 137 mmol/L (ref 135–145)
Total Bilirubin: 0.4 mg/dL (ref 0.0–1.2)
Total Protein: 7.2 g/dL (ref 6.5–8.1)

## 2024-04-21 LAB — CBC WITH DIFFERENTIAL (CANCER CENTER ONLY)
Abs Immature Granulocytes: 0.01 10*3/uL (ref 0.00–0.07)
Basophils Absolute: 0 10*3/uL (ref 0.0–0.1)
Basophils Relative: 0 %
Eosinophils Absolute: 0.5 10*3/uL (ref 0.0–0.5)
Eosinophils Relative: 9 %
HCT: 34.4 % — ABNORMAL LOW (ref 36.0–46.0)
Hemoglobin: 11.2 g/dL — ABNORMAL LOW (ref 12.0–15.0)
Immature Granulocytes: 0 %
Lymphocytes Relative: 39 %
Lymphs Abs: 2.3 10*3/uL (ref 0.7–4.0)
MCH: 31.6 pg (ref 26.0–34.0)
MCHC: 32.6 g/dL (ref 30.0–36.0)
MCV: 97.2 fL (ref 80.0–100.0)
Monocytes Absolute: 0.6 10*3/uL (ref 0.1–1.0)
Monocytes Relative: 10 %
Neutro Abs: 2.5 10*3/uL (ref 1.7–7.7)
Neutrophils Relative %: 42 %
Platelet Count: 197 10*3/uL (ref 150–400)
RBC: 3.54 MIL/uL — ABNORMAL LOW (ref 3.87–5.11)
RDW: 14.4 % (ref 11.5–15.5)
WBC Count: 6 10*3/uL (ref 4.0–10.5)
nRBC: 0 % (ref 0.0–0.2)

## 2024-04-21 LAB — TSH: TSH: 6.334 u[IU]/mL — ABNORMAL HIGH (ref 0.350–4.500)

## 2024-04-21 MED ORDER — SODIUM CHLORIDE 0.9 % IV SOLN
INTRAVENOUS | Status: DC
  Filled 2024-04-21: qty 250

## 2024-04-21 MED ORDER — SODIUM CHLORIDE 0.9 % IV SOLN
1.0000 mg/kg | Freq: Once | INTRAVENOUS | Status: AC
  Administered 2024-04-21: 50 mg via INTRAVENOUS
  Filled 2024-04-21: qty 3

## 2024-04-21 MED ORDER — TRAMADOL HCL 50 MG PO TABS
50.0000 mg | ORAL_TABLET | Freq: Four times a day (QID) | ORAL | 0 refills | Status: DC | PRN
Start: 2024-04-21 — End: 2024-08-12

## 2024-04-21 MED ORDER — PROCHLORPERAZINE MALEATE 10 MG PO TABS
10.0000 mg | ORAL_TABLET | Freq: Once | ORAL | Status: AC
  Administered 2024-04-21: 10 mg via ORAL
  Filled 2024-04-21: qty 1

## 2024-04-21 MED ORDER — SODIUM CHLORIDE 0.9 % IV SOLN
200.0000 mg | Freq: Once | INTRAVENOUS | Status: AC
  Administered 2024-04-21: 200 mg via INTRAVENOUS
  Filled 2024-04-21: qty 8

## 2024-04-21 MED ORDER — HEPARIN SOD (PORK) LOCK FLUSH 100 UNIT/ML IV SOLN
500.0000 [IU] | Freq: Once | INTRAVENOUS | Status: AC | PRN
  Administered 2024-04-21: 500 [IU]
  Filled 2024-04-21: qty 5

## 2024-04-21 MED ORDER — PROCHLORPERAZINE MALEATE 10 MG PO TABS: 10.0000 mg | ORAL_TABLET | Freq: Four times a day (QID) | ORAL | 1 refills | Status: DC | PRN

## 2024-04-21 MED ORDER — SODIUM CHLORIDE 0.9% FLUSH
10.0000 mL | INTRAVENOUS | Status: DC | PRN
  Administered 2024-04-21: 10 mL
  Filled 2024-04-21: qty 10

## 2024-04-21 NOTE — Addendum Note (Signed)
 Addended by: JOSHUA ALFONSO CROME on: 04/21/2024 10:16 AM   Modules accepted: Orders

## 2024-04-21 NOTE — Assessment & Plan Note (Addendum)
#   Stage/metastatic urothelial cancer; [facility] testing was sent on peritoneal biopsies - no targets currently on Pembro+ Erfortumab- MARCH 17th, 2025- CT chest abdomen pelvis reviewed from March 2025 showed positive response to the treatment.  Imaging was also discussed in tumor conference.  No evidence of any disease noted.    # Proceed with Pem+Enfor 1mg /kg- day1 today. Labs-CBC/chemistries were reviewed with the patient.  Patient tolerating well except for skin rash see below.  Will plan PET in 3 weeks. Ordered.   # Renal/Hypokalemia- -Potassium 3.3.  Declined oral and IV potassium supplements; supp- potassium rich food.  Continue monitoring.  CKD stage III -however GFR today is lower at 35.  Likely secondary to poor hydration.  Increase fluid intake.   # Weight loss: -Continue to follow with nutrition.  Stable weight.  Defer to consider adding Zyprexa.   # Maculopapular rash: Likely secondary to enfortumab vedotin - Initially, started with grade 3 involving more than 30% BSA with severe pruritus, mild fever.  Significantly improved after dose reduction. Continue with supportive care-Claritin, Aveeno moisturizer and Singulair ; -Recently, she has been having intermittent flareups on the arm and leg.  Sent Kenalog  0.5% twice daily as needed to the affected areas.  Avoid use on face, groin and axilla- stable.   # Mild lower abdominal pain- sec to malignancy- recommend tramadol  as needed.refilled.    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  Continue follow-up with Dr. Alvaro.stable.   # Acid reflux/gas/bloating-advised to take OTC simethicone, Tums, Pepcid  as needed.stable  # Fatigue: sec to chemo- consider to care program: re: Fatigue   # IV Access-port placed on 05/03/2023- functional.   PS- # DISPOSITION: # chemo today # as per IS- in 1 week-chemo/labs- # Follow up in 3 weeks- MD:  labs- cbc/cmp; chemo- PET scan prior # in 4 weeks- labs- cbc/bmp; chemo- Dr.B

## 2024-04-21 NOTE — Progress Notes (Signed)
 [facility] CONSULT NOTE  Patient Care Team: McDonough, Tinnie Barbara [name] as PCP - General (Physician Assistant) Rennie Barbara SAUNDERS, MD as Consulting Physician (Oncology)  CHIEF COMPLAINTS/PURPOSE OF CONSULTATION: Bladder cancer  Oncology History Overview Note  # Metastatic urothelial carcinoma - Patient developed urinary symptoms around September or October 2023.  Was treated with multiple rounds of antibiotics with no improvement.  She was then referred to Dr. Penne for further evaluation.   - s/p TURBT with left ureteral stent placement by Dr. Penne.  IntraOp findings showed large irregular mucosal submucosal extensive tumor from the left dome, left lateral bladder wall majority of trigone including the left UO and bladder neck.  Measures at least 5 x 5 cm. Pathology showed invasive high-grade urothelial carcinoma. Invades muscularis propria.   - Completed neoadjuvant cycle 4-day 1 of split dose cisplatin  and gemcitabine  (07/23/2023). Pt declined C4D8 due to poor tolerance/nausea/flank pain.  Split dose cisplatin  considered for CKD stage III.   - s/p robotic adhesiolysis + peritoneal biopsies 09/25/23 showed urothelial cancer with peritoneal carcinomatosis and small + large bowel serosal involvement. Original plan was cystectomy and urinary diversion but this was aborted based on intra-op findings.   # JAN 14th, 2025- Start Pem+Erfo   Urothelial cancer (HCC)  04/12/2023 Initial Diagnosis   Urothelial cancer (HCC)   04/12/2023 Cancer Staging   Staging form: Kidney, AJCC 8th Edition - Clinical: Stage III (cT3, cN0, cM0) - Signed by Agrawal, Kavita, MD on 05/07/2023 Stage prefix: Initial diagnosis   09/25/2023 Cancer Staging   Staging form: Kidney, AJCC 8th Edition - Pathologic stage from 09/25/2023: Stage IV (pM1) - Signed by Clista Bimler, MD on 10/25/2023   Malignant neoplasm of urinary bladder (HCC)  04/22/2023 Initial Diagnosis   Malignant neoplasm of urinary bladder  (HCC)   05/07/2023 - 07/23/2023 Chemotherapy   Patient is on Treatment Plan : BLADDER Gemcitabine  D1,8 + Cisplatin  (split dose) D1,8 q21d x 4 cycles     11/05/2023 -  Chemotherapy   Patient is on Treatment Plan : UROTHELIAL ADVANCED, METASTATIC ENFORTUMAB D1, D8 + PEMBROLIZUMAB  (200) D1 Q21D       HISTORY OF PRESENTING ILLNESS: Patient ambulating-independently.   Alone.   Barbara [name] 63 y.o.  female pleasant patient with stage IV/metastatic urothelial cancer-currently on pembrolizumab  plus Enfortumab is here for follow-up.  Patient here for follow up; patient voices no new concerns at this time. Patient continues eye moisturizing/drops advised.  Complaints of poor taste.  There is no weight loss.  Continues to have mild nausea, using the compazine .   C/o rash on overall body, itchy-overall stable on current regimen. Complains of fatigue.  Mild lower abdominal pain-   Review of Systems  Constitutional:  Positive for malaise/fatigue. Negative for chills, diaphoresis, fever and weight loss.  HENT:  Negative for nosebleeds and sore throat.   Eyes:  Negative for double vision.  Respiratory:  Negative for cough, hemoptysis, sputum production, shortness of breath and wheezing.   Cardiovascular:  Negative for chest pain, palpitations, orthopnea and leg swelling.  Gastrointestinal:  Negative for abdominal pain, blood in stool, constipation, diarrhea, heartburn, melena, nausea and vomiting.  Genitourinary:  Negative for dysuria, frequency and urgency.  Musculoskeletal:  Negative for back pain and joint pain.  Skin:  Positive for itching and rash.  Neurological:  Negative for dizziness, tingling, focal weakness, weakness and headaches.  Endo/Heme/Allergies:  Does not bruise/bleed easily.  Psychiatric/Behavioral:  Negative for depression. The patient is not nervous/anxious and does not  have insomnia.     MEDICAL HISTORY:  Past Medical History:  Diagnosis Date   Adopted    Allergic  rhinitis    Anemia    h/o   Anxiety    Arthritis    B12 deficiency    Bilateral carotid artery stenosis    Bilateral carotid bruits    Bladder tumor    Cancer (HCC)    bladder cancer   Depression    Gallbladder polyp    GERD (gastroesophageal reflux disease)    Heart murmur    Hydronephrosis of left kidney    Migraines    Tobacco abuse    UTI (urinary tract infection)     SURGICAL HISTORY: Past Surgical History:  Procedure Laterality Date   ABDOMINAL HYSTERECTOMY     BLADDER SURGERY     BREAST BIOPSY Left 2014   neg   CYSTOSCOPY W/ RETROGRADES Bilateral 01/29/2024   Procedure: CYSTOSCOPY, WITH RETROGRADE PYELOGRAM;  Surgeon: Alvaro Ricardo KATHEE Mickey., MD;  Location: [facility];  Service: Urology;  Laterality: Bilateral;   CYSTOSCOPY W/ URETERAL STENT PLACEMENT Left 04/04/2023   Procedure: CYSTOSCOPY WITH RETROGRADE PYELOGRAM/URETERAL STENT PLACEMENT;  Surgeon: Penne Knee, MD;  Location: [facility];  Service: Urology;  Laterality: Left;   CYSTOSCOPY WITH STENT PLACEMENT Left 01/29/2024   Procedure: CYSTOSCOPY, WITH STENT INSERTION;  Surgeon: Alvaro Ricardo KATHEE Mickey., MD;  Location: [facility];  Service: Urology;  Laterality: Left;   FOOT SURGERY Right    2009   IR IMAGING GUIDED PORT INSERTION  05/03/2023   LAPAROSCOPIC APPENDECTOMY  12/05/2012   NASAL SEPTUM SURGERY     ROBOT ASSISTED LAPAROSCOPIC COMPLETE CYSTECT ILEAL CONDUIT N/A 09/25/2023   Procedure: XI ROBOTIC ASSISTED LAPAROSCOPIC EXTENSIVE LYSIS OF ADHESIONS, PERITONEAL BIOPSIES;  Surgeon: Alvaro Ricardo KATHEE Mickey., MD;  Location: [facility];  Service: Urology;  Laterality: N/A;   SISTRUNK PROCEDURE  03/03/2012   Thyroglossal duct cyst excision   TONSILLECTOMY     TRANSURETHRAL RESECTION OF BLADDER TUMOR N/A 04/04/2023   Procedure: TRANSURETHRAL RESECTION OF BLADDER TUMOR (TURBT);  Surgeon: Penne Knee, MD;  Location: [facility];  Service: Urology;  Laterality: N/A;    SOCIAL HISTORY: Social History   Socioeconomic History   Marital  status: Married    Spouse name: Not on file   Number of children: Not on file   Years of education: Not on file   Highest education level: Not on file  Occupational History   Not on file  Tobacco Use   Smoking status: Former    Current packs/day: 0.25    Average packs/day: 0.3 packs/day for 30.0 years (7.5 ttl pk-yrs)    Types: Cigarettes    Passive exposure: Past   Smokeless tobacco: Never  Vaping Use   Vaping status: Never Used  Substance and Sexual Activity   Alcohol  use: Never   Drug use: Never   Sexual activity: Not on file  Other Topics Concern   Not on file  Social History Narrative   Not on file   Social Drivers of Health   Financial Resource Strain: Not on file  Food Insecurity: No Food Insecurity (09/25/2023)   Hunger Vital Sign    Worried About Running Out of Food in the Last Year: Never true    Ran Out of Food in the Last Year: Never true  Transportation Needs: No Transportation Needs (09/25/2023)   PRAPARE - Administrator, Civil Service (Medical): No    Lack of Transportation (Non-Medical): No  Physical  Activity: Not on file  Stress: Not on file  Social Connections: Not on file  Intimate Partner Violence: Not At Risk (09/25/2023)   Humiliation, Afraid, Rape, and Kick questionnaire    Fear of Current or Ex-Partner: No    Emotionally Abused: No    Physically Abused: No    Sexually Abused: No    FAMILY HISTORY: Family History  Adopted: Yes    ALLERGIES:  is allergic to pseudoephedrine hcl.  MEDICATIONS:  Current Outpatient Medications  Medication Sig Dispense Refill   acetaminophen  (TYLENOL ) 500 MG tablet Take 1,000 mg by mouth every 6 (six) hours as needed for moderate pain (pain score 4-6).     carboxymethylcellulose (REFRESH PLUS) 0.5 % SOLN Place 1 drop into both eyes 3 (three) times daily as needed (dry eyes).     carisoprodol  (SOMA ) 350 MG tablet Take 1 tablet (350 mg total) by mouth at bedtime as needed for muscle spasms. 90 tablet  0   dicyclomine  (BENTYL ) 10 MG capsule Take 1 capsule (10 mg total) by mouth 3 (three) times daily as needed for up to 30 doses for spasms. 30 capsule 0   escitalopram  (LEXAPRO ) 5 MG tablet Take 1 tablet (5 mg total) by mouth daily. 90 tablet 1   estradiol  (ESTRACE ) 1 MG tablet TAKE 1 TABLET BY MOUTH DAILY 90 tablet 3   famotidine  (PEPCID ) 10 MG tablet Take 10 mg by mouth 2 (two) times daily.     lidocaine -prilocaine  (EMLA ) cream Apply 1 Application topically as needed. 30 g 0   montelukast  (SINGULAIR ) 10 MG tablet Take 1 tablet (10 mg total) by mouth at bedtime. As needed if breaks out into rash (Patient taking differently: Take 10 mg by mouth at bedtime.) 90 tablet 1   ondansetron  (ZOFRAN ) 8 MG tablet Take 1 tablet (8 mg total) by mouth every 8 (eight) hours as needed for up to 30 doses for nausea or vomiting. 30 tablet 0   topiramate  (TOPAMAX ) 50 MG tablet TAKE 2 TABLETS BY MOUTH AT  BEDTIME 180 tablet 3   triamcinolone  ointment (KENALOG ) 0.5 % Apply 1 Application topically 2 (two) times daily as needed (on flared up areas of arms and legs). Do not apply on face, armpit and groin. 30 g 0   prochlorperazine  (COMPAZINE ) 10 MG tablet Take 1 tablet (10 mg total) by mouth every 6 (six) hours as needed for nausea or vomiting. 90 tablet 1   traMADol  (ULTRAM ) 50 MG tablet Take 1 tablet (50 mg total) by mouth every 6 (six) hours as needed for moderate pain (pain score 4-6) or severe pain (pain score 7-10) (from bladder cancer). 30 tablet 0   No current facility-administered medications for this visit.   Facility-Administered Medications Ordered in Other Visits  Medication Dose Route Frequency Provider Last Rate Last Admin   heparin  lock flush 100 unit/mL  500 Units Intravenous Once Agrawal, Kavita, MD       heparin  lock flush 100 unit/mL  500 Units Intravenous Once Agrawal, Kavita, MD       heparin  lock flush 100 unit/mL  500 Units Intravenous Once Agrawal, Kavita, MD       sodium chloride  flush (NS)  0.9 % injection 10 mL  10 mL Intravenous Once Agrawal, Kavita, MD       sodium chloride  flush (NS) 0.9 % injection 10 mL  10 mL Intravenous Once Agrawal, Kavita, MD       sodium chloride  flush (NS) 0.9 % injection 10 mL  10 mL Intravenous  Once Agrawal, Kavita, MD        PHYSICAL EXAMINATION:   Vitals:   04/21/24 0851  BP: 124/74  Pulse: 62  Resp: 16  Temp: (!) 97 F (36.1 C)  SpO2: 100%   Filed Weights   04/21/24 0851  Weight: 109 lb (49.4 kg)    Physical Exam Vitals and nursing note reviewed.  HENT:     Head: Normocephalic and atraumatic.     Mouth/Throat:     Pharynx: Oropharynx is clear.   Eyes:     Extraocular Movements: Extraocular movements intact.     Pupils: Pupils are equal, round, and reactive to light.    Cardiovascular:     Rate and Rhythm: Normal rate and regular rhythm.  Pulmonary:     Comments: Decreased breath sounds bilaterally.  Abdominal:     Palpations: Abdomen is soft.   Musculoskeletal:        General: Normal range of motion.     Cervical back: Normal range of motion.   Skin:    General: Skin is warm.   Neurological:     General: No focal deficit present.     Mental Status: She is alert and oriented to person, place, and time.   Psychiatric:        Behavior: Behavior normal.        Judgment: Judgment normal.     LABORATORY DATA:  I have reviewed the data as listed Lab Results  Component Value Date   WBC 6.0 04/21/2024   HGB 11.2 (L) 04/21/2024   HCT 34.4 (L) 04/21/2024   MCV 97.2 04/21/2024   PLT 197 04/21/2024   Recent Labs    03/11/24 0803 03/17/24 1008 04/21/24 0832  NA 137 136 137  K 3.6 3.5 3.5  CL 107 105 109  CO2 25 23 23   GLUCOSE 83 87 84  BUN 22 23 37*  CREATININE 1.13* 1.32* 1.64*  CALCIUM  8.6* 8.6* 8.7*  GFRNONAA 55* 45* 35*  PROT 7.1 7.6 7.2  ALBUMIN 3.5 3.9 3.8  AST 18 19 19   ALT 9 9 9   ALKPHOS 43 52 50  BILITOT 0.3 0.7 0.4    RADIOGRAPHIC STUDIES: I have personally reviewed the radiological  images as listed and agreed with the findings in the report. No results found.   Malignant neoplasm of urinary bladder [facility]) # Stage/metastatic urothelial cancer; [facility] testing was sent on peritoneal biopsies - no targets currently on Pembro+ Erfortumab- MARCH 17th, 2025- CT chest abdomen pelvis reviewed from March 2025 showed positive response to the treatment.  Imaging was also discussed in tumor conference.  No evidence of any disease noted.    # Proceed with Pem+Enfor 1mg /kg- day1 today. Labs-CBC/chemistries were reviewed with the patient.  Patient tolerating well except for skin rash see below.  Will plan PET in 3 weeks. Ordered.   # Renal/Hypokalemia- -Potassium 3.3.  Declined oral and IV potassium supplements; supp- potassium rich food.  Continue monitoring.  CKD stage III -however GFR today is lower at 35.  Likely secondary to poor hydration.  Increase fluid intake.   # Weight loss: -Continue to follow with nutrition.  Stable weight.  Defer to consider adding Zyprexa.   # Maculopapular rash: Likely secondary to enfortumab vedotin - Initially, started with grade 3 involving more than 30% BSA with severe pruritus, mild fever.  Significantly improved after dose reduction. Continue with supportive care-Claritin, Aveeno moisturizer and Singulair ; -Recently, she has been having intermittent flareups on the arm and leg.  Sent  Kenalog  0.5% twice daily as needed to the affected areas.  Avoid use on face, groin and axilla- stable.   # Mild lower abdominal pain- sec to malignancy- recommend tramadol  as needed.refilled.    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  Continue follow-up with Dr. Alvaro.stable.   # Acid reflux/gas/bloating-advised to take OTC simethicone, Tums, Pepcid  as needed.stable  # Fatigue: sec to chemo- consider to care program: re: Fatigue   # IV Access-port placed  on 05/03/2023- functional.   PS- # DISPOSITION: # chemo today # as per IS- in 1 week-chemo/labs- # Follow up in 3 weeks- MD: labs- cbc/cmp; chemo- PET scan prior # in 4 weeks- labs- cbc/bmp; chemo- Dr.B    Above plan of care was discussed with patient/family in detail.  My contact information was given to the patient/family.       Barbara JONELLE Joe, MD 04/21/2024 9:45 AM

## 2024-04-21 NOTE — Patient Instructions (Signed)
 [facility] - A DEPT OF [facility]  Discharge Instructions: Thank you for choosing [facility] to provide your oncology and hematology care.  If you have a lab appointment with the Cancer Center, please go directly to the Cancer Center and check in at the registration area.  Wear comfortable clothing and clothing appropriate for easy access to any Portacath or PICC line.   We strive to give you quality time with your provider. You may need to reschedule your appointment if you arrive late (15 or more minutes).  Arriving late affects you and other patients whose appointments are after yours.  Also, if you miss three or more appointments without notifying the office, you may be dismissed from the clinic at the provider's discretion.      For prescription refill requests, have your pharmacy contact our office and allow 72 hours for refills to be completed.    Today you received the following chemotherapy and/or immunotherapy agents- padcev , keytruda       To help prevent nausea and vomiting after your treatment, we encourage you to take your nausea medication as directed.  BELOW ARE SYMPTOMS THAT SHOULD BE REPORTED IMMEDIATELY: *FEVER GREATER THAN 100.4 F (38 C) OR HIGHER *CHILLS OR SWEATING *NAUSEA AND VOMITING THAT IS NOT CONTROLLED WITH YOUR NAUSEA MEDICATION *UNUSUAL SHORTNESS OF BREATH *UNUSUAL BRUISING OR BLEEDING *URINARY PROBLEMS (pain or burning when urinating, or frequent urination) *BOWEL PROBLEMS (unusual diarrhea, constipation, pain near the anus) TENDERNESS IN MOUTH AND THROAT WITH OR WITHOUT PRESENCE OF ULCERS (sore throat, sores in mouth, or a toothache) UNUSUAL RASH, SWELLING OR PAIN  UNUSUAL VAGINAL DISCHARGE OR ITCHING   Items with * indicate a potential emergency and should be followed up as soon as possible or go to the Emergency Department if any problems should occur.  Please show the CHEMOTHERAPY ALERT CARD or  IMMUNOTHERAPY ALERT CARD at check-in to the Emergency Department and triage nurse.  Should you have questions after your visit or need to cancel or reschedule your appointment, please contact [facility] - A DEPT OF [facility]  [phone] and follow the prompts.  Office hours are 8:00 a.m. to 4:30 p.m. Monday - Friday. Please note that voicemails left after 4:00 p.m. may not be returned until the following business day.  We are closed weekends and major holidays. You have access to a nurse at all times for urgent questions. Please call the main number to the clinic [phone] and follow the prompts.  For any non-urgent questions, you may also contact your provider using MyChart. We now offer e-Visits for anyone 31 and older to request care online for non-urgent symptoms. For details visit [email or web address].   Also download the MyChart app! Go to the app store, search "MyChart", open the app, select [place], and log in with your MyChart username and password.

## 2024-04-21 NOTE — Progress Notes (Signed)
 Patient here for follow up; patient voices no new concerns at this time

## 2024-04-22 ENCOUNTER — Ambulatory Visit: Admitting: Internal Medicine

## 2024-04-22 ENCOUNTER — Other Ambulatory Visit: Payer: Self-pay | Admitting: *Deleted

## 2024-04-22 ENCOUNTER — Other Ambulatory Visit

## 2024-04-22 DIAGNOSIS — C689 Malignant neoplasm of urinary organ, unspecified: Secondary | ICD-10-CM

## 2024-04-28 ENCOUNTER — Inpatient Hospital Stay

## 2024-04-28 VITALS — BP 109/71 | HR 68 | Temp 97.1°F | Resp 17 | Wt 109.8 lb

## 2024-04-28 DIAGNOSIS — Z5112 Encounter for antineoplastic immunotherapy: Secondary | ICD-10-CM | POA: Diagnosis not present

## 2024-04-28 DIAGNOSIS — C679 Malignant neoplasm of bladder, unspecified: Secondary | ICD-10-CM

## 2024-04-28 DIAGNOSIS — C689 Malignant neoplasm of urinary organ, unspecified: Secondary | ICD-10-CM

## 2024-04-28 LAB — CMP (CANCER CENTER ONLY)
ALT: 9 U/L (ref 0–44)
AST: 25 U/L (ref 15–41)
Albumin: 3.7 g/dL (ref 3.5–5.0)
Alkaline Phosphatase: 45 U/L (ref 38–126)
Anion gap: 8 (ref 5–15)
BUN: 20 mg/dL (ref 8–23)
CO2: 24 mmol/L (ref 22–32)
Calcium: 8.7 mg/dL — ABNORMAL LOW (ref 8.9–10.3)
Chloride: 107 mmol/L (ref 98–111)
Creatinine: 1.46 mg/dL — ABNORMAL HIGH (ref 0.44–1.00)
GFR, Estimated: 40 mL/min — ABNORMAL LOW (ref >60)
Glucose, Bld: 114 mg/dL — ABNORMAL HIGH (ref 70–99)
Potassium: 3.5 mmol/L (ref 3.5–5.1)
Sodium: 139 mmol/L (ref 135–145)
Total Bilirubin: 0.6 mg/dL (ref 0.0–1.2)
Total Protein: 7.2 g/dL (ref 6.5–8.1)

## 2024-04-28 LAB — CBC WITH DIFFERENTIAL (CANCER CENTER ONLY)
Abs Immature Granulocytes: 0.01 K/uL (ref 0.00–0.07)
Basophils Absolute: 0 K/uL (ref 0.0–0.1)
Basophils Relative: 1 %
Eosinophils Absolute: 1 K/uL — ABNORMAL HIGH (ref 0.0–0.5)
Eosinophils Relative: 16 %
HCT: 35.5 % — ABNORMAL LOW (ref 36.0–46.0)
Hemoglobin: 11.7 g/dL — ABNORMAL LOW (ref 12.0–15.0)
Immature Granulocytes: 0 %
Lymphocytes Relative: 40 %
Lymphs Abs: 2.5 K/uL (ref 0.7–4.0)
MCH: 31.5 pg (ref 26.0–34.0)
MCHC: 33 g/dL (ref 30.0–36.0)
MCV: 95.4 fL (ref 80.0–100.0)
Monocytes Absolute: 0.6 K/uL (ref 0.1–1.0)
Monocytes Relative: 9 %
Neutro Abs: 2.1 K/uL (ref 1.7–7.7)
Neutrophils Relative %: 34 %
Platelet Count: 183 K/uL (ref 150–400)
RBC: 3.72 MIL/uL — ABNORMAL LOW (ref 3.87–5.11)
RDW: 14 % (ref 11.5–15.5)
WBC Count: 6.1 K/uL (ref 4.0–10.5)
nRBC: 0 % (ref 0.0–0.2)

## 2024-04-28 MED ORDER — PROCHLORPERAZINE MALEATE 10 MG PO TABS
10.0000 mg | ORAL_TABLET | Freq: Once | ORAL | Status: AC
  Administered 2024-04-28: 10 mg via ORAL
  Filled 2024-04-28: qty 1

## 2024-04-28 MED ORDER — SODIUM CHLORIDE 0.9 % IV SOLN
1.0000 mg/kg | Freq: Once | INTRAVENOUS | Status: AC
  Administered 2024-04-28: 50 mg via INTRAVENOUS
  Filled 2024-04-28: qty 3

## 2024-04-28 MED ORDER — SODIUM CHLORIDE 0.9% FLUSH
10.0000 mL | Freq: Once | INTRAVENOUS | Status: AC
  Administered 2024-04-28: 10 mL via INTRAVENOUS
  Filled 2024-04-28: qty 10

## 2024-04-28 MED ORDER — SODIUM CHLORIDE 0.9 % IV SOLN
INTRAVENOUS | Status: DC
  Filled 2024-04-28: qty 250

## 2024-04-28 MED ORDER — HEPARIN SOD (PORK) LOCK FLUSH 100 UNIT/ML IV SOLN
500.0000 [IU] | Freq: Once | INTRAVENOUS | Status: AC | PRN
  Administered 2024-04-28: 500 [IU]
  Filled 2024-04-28: qty 5

## 2024-04-28 NOTE — Patient Instructions (Signed)
 [facility] - A DEPT OF [facility]  Discharge Instructions: Thank you for choosing [facility] to provide your oncology and hematology care.  If you have a lab appointment with the Cancer Center, please go directly to the Cancer Center and check in at the registration area.  Wear comfortable clothing and clothing appropriate for easy access to any Portacath or PICC line.   We strive to give you quality time with your provider. You may need to reschedule your appointment if you arrive late (15 or more minutes).  Arriving late affects you and other patients whose appointments are after yours.  Also, if you miss three or more appointments without notifying the office, you may be dismissed from the clinic at the provider's discretion.      For prescription refill requests, have your pharmacy contact our office and allow 72 hours for refills to be completed.    Today you received the following chemotherapy and/or immunotherapy agents: enfortumab     To help prevent nausea and vomiting after your treatment, we encourage you to take your nausea medication as directed.  BELOW ARE SYMPTOMS THAT SHOULD BE REPORTED IMMEDIATELY: *FEVER GREATER THAN 100.4 F (38 C) OR HIGHER *CHILLS OR SWEATING *NAUSEA AND VOMITING THAT IS NOT CONTROLLED WITH YOUR NAUSEA MEDICATION *UNUSUAL SHORTNESS OF BREATH *UNUSUAL BRUISING OR BLEEDING *URINARY PROBLEMS (pain or burning when urinating, or frequent urination) *BOWEL PROBLEMS (unusual diarrhea, constipation, pain near the anus) TENDERNESS IN MOUTH AND THROAT WITH OR WITHOUT PRESENCE OF ULCERS (sore throat, sores in mouth, or a toothache) UNUSUAL RASH, SWELLING OR PAIN  UNUSUAL VAGINAL DISCHARGE OR ITCHING   Items with * indicate a potential emergency and should be followed up as soon as possible or go to the Emergency Department if any problems should occur.  Please show the CHEMOTHERAPY ALERT CARD or IMMUNOTHERAPY  ALERT CARD at check-in to the Emergency Department and triage nurse.  Should you have questions after your visit or need to cancel or reschedule your appointment, please contact [facility] - A DEPT OF [facility]  [phone] and follow the prompts.  Office hours are 8:00 a.m. to 4:30 p.m. Monday - Friday. Please note that voicemails left after 4:00 p.m. may not be returned until the following business day.  We are closed weekends and major holidays. You have access to a nurse at all times for urgent questions. Please call the main number to the clinic [phone] and follow the prompts.  For any non-urgent questions, you may also contact your provider using MyChart. We now offer e-Visits for anyone 36 and older to request care online for non-urgent symptoms. For details visit [email or web address].   Also download the MyChart app! Go to the app store, search MyChart, open the app, select [place], and log in with your MyChart username and password.

## 2024-04-29 LAB — THYROID PANEL WITH TSH
Free Thyroxine Index: 1.9 (ref 1.2–4.9)
T3 Uptake Ratio: 24 % (ref 24–39)
T4, Total: 7.9 ug/dL (ref 4.5–12.0)
TSH: 8.78 u[IU]/mL — ABNORMAL HIGH (ref 0.450–4.500)

## 2024-05-06 ENCOUNTER — Ambulatory Visit
Admission: RE | Admit: 2024-05-06 | Discharge: 2024-05-06 | Disposition: A | Discharge status: Home / Self Care | Source: Ambulatory Visit | Attending: Internal Medicine | Admitting: Internal Medicine

## 2024-05-06 DIAGNOSIS — I700 Atherosclerosis of aorta: Secondary | ICD-10-CM | POA: Insufficient documentation

## 2024-05-06 DIAGNOSIS — C679 Malignant neoplasm of bladder, unspecified: Secondary | ICD-10-CM | POA: Insufficient documentation

## 2024-05-06 DIAGNOSIS — R1032 Left lower quadrant pain: Secondary | ICD-10-CM | POA: Diagnosis present

## 2024-05-06 LAB — GLUCOSE, CAPILLARY: Glucose-Capillary: 87 mg/dL (ref 70–99)

## 2024-05-06 MED ORDER — FLUDEOXYGLUCOSE F - 18 (FDG) INJECTION
6.0500 | Freq: Once | INTRAVENOUS | Status: AC | PRN
  Administered 2024-05-06: 6.05 via INTRAVENOUS

## 2024-05-11 ENCOUNTER — Other Ambulatory Visit: Payer: Self-pay | Admitting: *Deleted

## 2024-05-12 ENCOUNTER — Encounter: Payer: Self-pay | Admitting: Internal Medicine

## 2024-05-12 ENCOUNTER — Inpatient Hospital Stay

## 2024-05-12 ENCOUNTER — Inpatient Hospital Stay: Admitting: Internal Medicine

## 2024-05-12 VITALS — BP 111/60 | HR 56

## 2024-05-12 VITALS — BP 114/66 | HR 70 | Temp 96.0°F | Resp 18 | Ht 65.5 in | Wt 108.5 lb

## 2024-05-12 DIAGNOSIS — Z5112 Encounter for antineoplastic immunotherapy: Secondary | ICD-10-CM | POA: Diagnosis not present

## 2024-05-12 DIAGNOSIS — C679 Malignant neoplasm of bladder, unspecified: Secondary | ICD-10-CM | POA: Diagnosis not present

## 2024-05-12 LAB — CBC WITH DIFFERENTIAL (CANCER CENTER ONLY)
Abs Immature Granulocytes: 0.01 K/uL (ref 0.00–0.07)
Basophils Absolute: 0 K/uL (ref 0.0–0.1)
Basophils Relative: 1 %
Eosinophils Absolute: 0.7 K/uL — ABNORMAL HIGH (ref 0.0–0.5)
Eosinophils Relative: 13 %
HCT: 35.1 % — ABNORMAL LOW (ref 36.0–46.0)
Hemoglobin: 11.8 g/dL — ABNORMAL LOW (ref 12.0–15.0)
Immature Granulocytes: 0 %
Lymphocytes Relative: 42 %
Lymphs Abs: 2.3 K/uL (ref 0.7–4.0)
MCH: 32.3 pg (ref 26.0–34.0)
MCHC: 33.6 g/dL (ref 30.0–36.0)
MCV: 96.2 fL (ref 80.0–100.0)
Monocytes Absolute: 0.5 K/uL (ref 0.1–1.0)
Monocytes Relative: 10 %
Neutro Abs: 1.8 K/uL (ref 1.7–7.7)
Neutrophils Relative %: 34 %
Platelet Count: 221 K/uL (ref 150–400)
RBC: 3.65 MIL/uL — ABNORMAL LOW (ref 3.87–5.11)
RDW: 14.1 % (ref 11.5–15.5)
WBC Count: 5.3 K/uL (ref 4.0–10.5)
nRBC: 0 % (ref 0.0–0.2)

## 2024-05-12 LAB — CMP (CANCER CENTER ONLY)
ALT: 9 U/L (ref 0–44)
AST: 18 U/L (ref 15–41)
Albumin: 3.9 g/dL (ref 3.5–5.0)
Alkaline Phosphatase: 48 U/L (ref 38–126)
Anion gap: 6 (ref 5–15)
BUN: 18 mg/dL (ref 8–23)
CO2: 24 mmol/L (ref 22–32)
Calcium: 8.5 mg/dL — ABNORMAL LOW (ref 8.9–10.3)
Chloride: 107 mmol/L (ref 98–111)
Creatinine: 1.2 mg/dL — ABNORMAL HIGH (ref 0.44–1.00)
GFR, Estimated: 51 mL/min — ABNORMAL LOW (ref >60)
Glucose, Bld: 80 mg/dL (ref 70–99)
Potassium: 3.4 mmol/L — ABNORMAL LOW (ref 3.5–5.1)
Sodium: 137 mmol/L (ref 135–145)
Total Bilirubin: 0.6 mg/dL (ref 0.0–1.2)
Total Protein: 7.4 g/dL (ref 6.5–8.1)

## 2024-05-12 MED ORDER — SODIUM CHLORIDE 0.9 % IV SOLN
INTRAVENOUS | Status: DC
  Filled 2024-05-12: qty 250

## 2024-05-12 MED ORDER — PROCHLORPERAZINE MALEATE 10 MG PO TABS
10.0000 mg | ORAL_TABLET | Freq: Once | ORAL | Status: AC
  Administered 2024-05-12: 10 mg via ORAL
  Filled 2024-05-12: qty 1

## 2024-05-12 MED ORDER — SODIUM CHLORIDE 0.9 % IV SOLN
1.0000 mg/kg | Freq: Once | INTRAVENOUS | Status: AC
  Administered 2024-05-12: 50 mg via INTRAVENOUS
  Filled 2024-05-12: qty 3

## 2024-05-12 MED ORDER — HEPARIN SOD (PORK) LOCK FLUSH 100 UNIT/ML IV SOLN
500.0000 [IU] | Freq: Once | INTRAVENOUS | Status: AC | PRN
Start: 2024-05-12 — End: 2024-05-12
  Administered 2024-05-12: 500 [IU]
  Filled 2024-05-12: qty 5

## 2024-05-12 MED ORDER — SODIUM CHLORIDE 0.9 % IV SOLN
200.0000 mg | Freq: Once | INTRAVENOUS | Status: AC
  Administered 2024-05-12: 200 mg via INTRAVENOUS
  Filled 2024-05-12: qty 200

## 2024-05-12 NOTE — Progress Notes (Signed)
 Barbara [name] CONSULT NOTE  Patient Care Team: McDonough, Barbara [name] as PCP - General (Physician Assistant) Barbara Barbara SAUNDERS, [name] as Consulting Physician (Oncology)  CHIEF COMPLAINTS/PURPOSE OF CONSULTATION: Bladder cancer  Oncology History Overview Note  # Metastatic urothelial carcinoma - Patient developed urinary symptoms around September or October 2023.  Was treated with multiple rounds of antibiotics with no improvement.  She was then referred to Dr. Penne for further evaluation.   - s/p TURBT with left ureteral stent placement by Dr. Penne.  IntraOp findings showed large irregular mucosal submucosal extensive tumor from the left dome, left lateral bladder wall majority of trigone including the left UO and bladder neck.  Measures at least 5 x 5 cm. Pathology showed invasive high-grade urothelial carcinoma. Invades muscularis propria.   - Completed neoadjuvant cycle 4-day 1 of split dose cisplatin  and gemcitabine  (07/23/2023). Pt declined C4D8 due to poor tolerance/nausea/flank pain.  Split dose cisplatin  considered for CKD stage III.   - s/p robotic adhesiolysis + peritoneal biopsies 09/25/23 showed urothelial cancer with peritoneal carcinomatosis and small + large bowel serosal involvement. Original plan was cystectomy and urinary diversion but this was aborted based on intra-op findings.   # JAN 14th, 2025- Start Pem+Erfo   Urothelial cancer (HCC)  04/12/2023 Initial Diagnosis   Urothelial cancer (HCC)   04/12/2023 Cancer Staging   Staging form: Kidney, AJCC 8th Edition - Clinical: Stage III (cT3, cN0, cM0) - Signed by Barbara [name] on 05/07/2023 Stage prefix: Initial diagnosis   09/25/2023 Cancer Staging   Staging form: Kidney, AJCC 8th Edition - Pathologic stage from 09/25/2023: Stage IV (pM1) - Signed by Barbara [name] on 10/25/2023   Malignant neoplasm of urinary bladder (HCC)  04/22/2023 Initial Diagnosis   Malignant neoplasm of urinary bladder  (HCC)   05/07/2023 - 07/23/2023 Chemotherapy   Patient is on Treatment Plan : BLADDER Gemcitabine  D1,8 + Cisplatin  (split dose) D1,8 q21d x 4 cycles     11/05/2023 -  Chemotherapy   Patient is on Treatment Plan : UROTHELIAL ADVANCED, METASTATIC ENFORTUMAB D1, D8 + PEMBROLIZUMAB  (200) D1 Q21D       HISTORY OF PRESENTING ILLNESS: Patient ambulating-independently.   Alone.   Barbara [name] 63 y.o.  female pleasant patient with stage IV/metastatic urothelial cancer-currently on pembrolizumab  plus Enfortumab is here for follow-up.  Patient states she would like to know if she is able to take vitamins while on immunotherapy.   Other than that she presents to be doing well with no new or acute concerns.   Patient continues eye moisturizing/drops advised. Complaints of poor taste.  There is no weight loss.  Continues to have mild nausea, using the compazine .   C/o rash on overall body, itchy-overall stable on current regimen. Complains of fatigue.  Mild lower abdominal pain-   Review of Systems  Constitutional:  Positive for malaise/fatigue. Negative for chills, diaphoresis, fever and weight loss.  HENT:  Negative for nosebleeds and sore throat.   Eyes:  Negative for double vision.  Respiratory:  Negative for cough, hemoptysis, sputum production, shortness of breath and wheezing.   Cardiovascular:  Negative for chest pain, palpitations, orthopnea and leg swelling.  Gastrointestinal:  Negative for abdominal pain, blood in stool, constipation, diarrhea, heartburn, melena, nausea and vomiting.  Genitourinary:  Negative for dysuria, frequency and urgency.  Musculoskeletal:  Negative for back pain and joint pain.  Skin:  Positive for itching and rash.  Neurological:  Negative for dizziness, tingling, focal weakness, weakness and  headaches.  Endo/Heme/Allergies:  Does not bruise/bleed easily.  Psychiatric/Behavioral:  Negative for depression. The patient is not nervous/anxious and does not have  insomnia.     MEDICAL HISTORY:  Past Medical History:  Diagnosis Date   Adopted    Allergic rhinitis    Anemia    h/o   Anxiety    Arthritis    B12 deficiency    Bilateral carotid artery stenosis    Bilateral carotid bruits    Bladder tumor    Cancer (HCC)    bladder cancer   Depression    Gallbladder polyp    GERD (gastroesophageal reflux disease)    Heart murmur    Hydronephrosis of left kidney    Migraines    Tobacco abuse    UTI (urinary tract infection)     SURGICAL HISTORY: Past Surgical History:  Procedure Laterality Date   ABDOMINAL HYSTERECTOMY     BLADDER SURGERY     BREAST BIOPSY Left 2014   neg   CYSTOSCOPY W/ RETROGRADES Bilateral 01/29/2024   Procedure: CYSTOSCOPY, WITH RETROGRADE PYELOGRAM;  Surgeon: Barbara [name]., [name];  Location: [facility];  Service: Urology;  Laterality: Bilateral;   CYSTOSCOPY W/ URETERAL STENT PLACEMENT Left 04/04/2023   Procedure: CYSTOSCOPY WITH RETROGRADE PYELOGRAM/URETERAL STENT PLACEMENT;  Surgeon: Barbara [name] Knee, [name];  Location: [facility];  Service: Urology;  Laterality: Left;   CYSTOSCOPY WITH STENT PLACEMENT Left 01/29/2024   Procedure: CYSTOSCOPY, WITH STENT INSERTION;  Surgeon: Barbara [name]., [name];  Location: [facility];  Service: Urology;  Laterality: Left;   FOOT SURGERY Right    2009   IR IMAGING GUIDED PORT INSERTION  05/03/2023   LAPAROSCOPIC APPENDECTOMY  12/05/2012   NASAL SEPTUM SURGERY     ROBOT ASSISTED LAPAROSCOPIC COMPLETE CYSTECT ILEAL CONDUIT N/A 09/25/2023   Procedure: XI ROBOTIC ASSISTED LAPAROSCOPIC EXTENSIVE LYSIS OF ADHESIONS, PERITONEAL BIOPSIES;  Surgeon: Barbara [name]., [name];  Location: [facility];  Service: Urology;  Laterality: N/A;   SISTRUNK PROCEDURE  03/03/2012   Thyroglossal duct cyst excision   TONSILLECTOMY     TRANSURETHRAL RESECTION OF BLADDER TUMOR N/A 04/04/2023   Procedure: TRANSURETHRAL RESECTION OF BLADDER TUMOR (TURBT);  Surgeon: Barbara [name] Knee, [name];  Location: [facility];   Service: Urology;  Laterality: N/A;    SOCIAL HISTORY: Social History   Socioeconomic History   Marital status: Married    Spouse name: Not on file   Number of children: Not on file   Years of education: Not on file   Highest education level: Not on file  Occupational History   Not on file  Tobacco Use   Smoking status: Former    Current packs/day: 0.25    Average packs/day: 0.3 packs/day for 30.0 years (7.5 ttl pk-yrs)    Types: Cigarettes    Passive exposure: Past   Smokeless tobacco: Never  Vaping Use   Vaping status: Never Used  Substance and Sexual Activity   Alcohol  use: Never   Drug use: Never   Sexual activity: Not on file  Other Topics Concern   Not on file  Social History Narrative   Not on file   Social Drivers of Health   Financial Resource Strain: Not on file  Food Insecurity: No Food Insecurity (09/25/2023)   Hunger Vital Sign    Worried About Running Out of Food in the Last Year: Never true    Ran Out of Food in the Last Year: Never true  Transportation Needs: No Transportation Needs (09/25/2023)  PRAPARE - Administrator, Civil Service (Medical): No    Lack of Transportation (Non-Medical): No  Physical Activity: Not on file  Stress: Not on file  Social Connections: Not on file  Intimate Partner Violence: Not At Risk (09/25/2023)   Humiliation, Afraid, Rape, and Kick questionnaire    Fear of Current or Ex-Partner: No    Emotionally Abused: No    Physically Abused: No    Sexually Abused: No    FAMILY HISTORY: Family History  Adopted: Yes    ALLERGIES:  is allergic to pseudoephedrine hcl.  MEDICATIONS:  Current Outpatient Medications  Medication Sig Dispense Refill   acetaminophen  (TYLENOL ) 500 MG tablet Take 1,000 mg by mouth every 6 (six) hours as needed for moderate pain (pain score 4-6).     carboxymethylcellulose (REFRESH PLUS) 0.5 % SOLN Place 1 drop into both eyes 3 (three) times daily as needed (dry eyes).      carisoprodol  (SOMA ) 350 MG tablet Take 1 tablet (350 mg total) by mouth at bedtime as needed for muscle spasms. 90 tablet 0   dicyclomine  (BENTYL ) 10 MG capsule Take 1 capsule (10 mg total) by mouth 3 (three) times daily as needed for up to 30 doses for spasms. 30 capsule 0   escitalopram  (LEXAPRO ) 5 MG tablet Take 1 tablet (5 mg total) by mouth daily. 90 tablet 1   estradiol  (ESTRACE ) 1 MG tablet TAKE 1 TABLET BY MOUTH DAILY 90 tablet 3   famotidine  (PEPCID ) 10 MG tablet Take 10 mg by mouth 2 (two) times daily.     lidocaine -prilocaine  (EMLA ) cream Apply 1 Application topically as needed. 30 g 0   montelukast  (SINGULAIR ) 10 MG tablet Take 1 tablet (10 mg total) by mouth at bedtime. As needed if breaks out into rash (Patient taking differently: Take 10 mg by mouth at bedtime.) 90 tablet 1   ondansetron  (ZOFRAN ) 8 MG tablet Take 1 tablet (8 mg total) by mouth every 8 (eight) hours as needed for up to 30 doses for nausea or vomiting. 30 tablet 0   prochlorperazine  (COMPAZINE ) 10 MG tablet Take 1 tablet (10 mg total) by mouth every 6 (six) hours as needed for nausea or vomiting. 90 tablet 1   topiramate  (TOPAMAX ) 50 MG tablet TAKE 2 TABLETS BY MOUTH AT  BEDTIME 180 tablet 3   traMADol  (ULTRAM ) 50 MG tablet Take 1 tablet (50 mg total) by mouth every 6 (six) hours as needed for moderate pain (pain score 4-6) or severe pain (pain score 7-10) (from bladder cancer). 30 tablet 0   triamcinolone  ointment (KENALOG ) 0.5 % Apply 1 Application topically 2 (two) times daily as needed (on flared up areas of arms and legs). Do not apply on face, armpit and groin. 30 g 0   No current facility-administered medications for this visit.   Facility-Administered Medications Ordered in Other Visits  Medication Dose Route Frequency Provider Last Rate Last Admin   0.9 %  sodium chloride  infusion   Intravenous Continuous Triana Coover R, [name] 10 mL/hr at 05/12/24 1026 New Bag at 05/12/24 1026   enfortumab vedotin -ejfv  (PADCEV ) 50 mg in sodium chloride  0.9 % 50 mL (0.9091 mg/mL) chemo infusion  1 mg/kg (Treatment Plan Recorded) Intravenous Once Shamarion Coots R, [name]       heparin  lock flush 100 unit/mL  500 Units Intravenous Once Barbara [name]       heparin  lock flush 100 unit/mL  500 Units Intravenous Once Barbara [name]  heparin  lock flush 100 unit/mL  500 Units Intravenous Once Barbara [name]       heparin  lock flush 100 unit/mL  500 Units Intracatheter Once PRN Vannia Pola R, [name]       pembrolizumab  (KEYTRUDA ) 200 mg in sodium chloride  0.9 % 50 mL chemo infusion  200 mg Intravenous Once Johne Buckle R, [name]       sodium chloride  flush (NS) 0.9 % injection 10 mL  10 mL Intravenous Once Barbara [name]       sodium chloride  flush (NS) 0.9 % injection 10 mL  10 mL Intravenous Once Barbara [name]       sodium chloride  flush (NS) 0.9 % injection 10 mL  10 mL Intravenous Once Barbara [name]        PHYSICAL EXAMINATION:   Vitals:   05/12/24 0915  BP: 114/66  Pulse: 70  Resp: 18  Temp: (!) 96 F (35.6 C)  SpO2: 99%   Filed Weights   05/12/24 0915  Weight: 108 lb 8 oz (49.2 kg)    Physical Exam Vitals and nursing note reviewed.  HENT:     Head: Normocephalic and atraumatic.     Mouth/Throat:     Pharynx: Oropharynx is clear.  Eyes:     Extraocular Movements: Extraocular movements intact.     Pupils: Pupils are equal, round, and reactive to light.  Cardiovascular:     Rate and Rhythm: Normal rate and regular rhythm.  Pulmonary:     Comments: Decreased breath sounds bilaterally.  Abdominal:     Palpations: Abdomen is soft.  Musculoskeletal:        General: Normal range of motion.     Cervical back: Normal range of motion.  Skin:    General: Skin is warm.  Neurological:     General: No focal deficit present.     Mental Status: She is alert and oriented to person, place, and time.  Psychiatric:        Behavior: Behavior normal.         Judgment: Judgment normal.     LABORATORY DATA:  I have reviewed the data as listed Lab Results  Component Value Date   WBC 5.3 05/12/2024   HGB 11.8 (L) 05/12/2024   HCT 35.1 (L) 05/12/2024   MCV 96.2 05/12/2024   PLT 221 05/12/2024   Recent Labs    04/21/24 0832 04/28/24 0823 05/12/24 0910  NA 137 139 137  K 3.5 3.5 3.4*  CL 109 107 107  CO2 23 24 24   GLUCOSE 84 114* 80  BUN 37* 20 18  CREATININE 1.64* 1.46* 1.20*  CALCIUM  8.7* 8.7* 8.5*  GFRNONAA 35* 40* 51*  PROT 7.2 7.2 7.4  ALBUMIN 3.8 3.7 3.9  AST 19 25 18   ALT 9 9 9   ALKPHOS 50 45 48  BILITOT 0.4 0.6 0.6    RADIOGRAPHIC STUDIES: I have personally reviewed the radiological images as listed and agreed with the findings in the report. NM PET Image Restage (PS) Skull Base to Thigh (F-18 FDG) Result Date: 05/08/2024 CLINICAL DATA:  Subsequent treatment strategy for metastatic bladder cancer. EXAM: NUCLEAR MEDICINE PET SKULL BASE TO THIGH TECHNIQUE: 6.05 mCi F-18 FDG was injected intravenously. Full-ring PET imaging was performed from the skull base to thigh after the radiotracer. CT data was obtained and used for attenuation correction and anatomic localization. Fasting blood glucose: 87 mg/dl COMPARISON:  PET-CT 98/93/7974. CT of the chest, abdomen and pelvis 01/06/2024. Abdominopelvic CT 06/09/2023.  FINDINGS: Mediastinal blood pool activity: SUV max 1.8 NECK: No hypermetabolic cervical lymph nodes are identified. No suspicious activity identified within the pharyngeal mucosal space. Incidental CT findings: none CHEST: No significant residual hypermetabolic activity within the previously demonstrated right paratracheal node. This cream has an SUV max of 2.3 (previously 4.2, remeasured). There are additional small mediastinal and axillary lymph nodes with low level metabolic activity, similar to the previous study. Subcarinal node has an SUV max of 3.3 (previously 4.1). Axillary nodal activity is similar to blood pool. These  are likely reactive. No hypermetabolic pulmonary activity or suspicious nodularity. Incidental CT findings: Right IJ Port-A-Cath extends to the superior cavoatrial junction. Atherosclerosis of the aorta, great vessels and coronary arteries. Calcifications of the aortic valve. ABDOMEN/PELVIS: There is no hypermetabolic activity within the liver, adrenal glands, spleen or pancreas. The previously demonstrated hypermetabolic retroperitoneal and pelvic lymph nodes have generally improved, consistent with response to treatment. A left external iliac node measuring 9 mm on image 132/6 has an SUV max of 3.5 (previously 4.4). Previously demonstrated left pelvic sidewall adenopathy has resolved. 9 mm right pelvic sidewall node measuring 9 mm on image 123/6 which has an SUV max of 3.9 (previously 4.3). This node does appear slightly enlarged compared with the most recent CT. In addition, there is new presacral nodule measuring 6 mm on image 126/6 with mild hypermetabolic activity (SUV max 3.0). Similar small inguinal lymph nodes bilaterally, likely reactive. Previously noted hypermetabolic activity along the vaginal cuff has improved. No definite peritoneal disease. Interval improved right abdominal wall activity, likely postsurgical. Incidental CT findings: Double-J left ureteral stent remains in place with residual mild ureteral dilatation and left renal cortical thinning and scarring. Calcified gallstone. Aortic and branch vessel atherosclerosis. SKELETON: There is no hypermetabolic activity to suggest osseous metastatic disease. Incidental CT findings: none IMPRESSION: 1. Compared with previous PET-CT, generally improved hypermetabolic retroperitoneal and pelvic lymph nodes, consistent with response to treatment. 2. There is a single right pelvic sidewall node which has slightly enlarged compared with the most recent CT and demonstrates mild hypermetabolic activity, suspicious for residual metastatic disease. 3. New small  presacral nodule with mild hypermetabolic activity, suspicious for metastatic disease. 4. No evidence of metastatic disease in the chest. 5.  Aortic Atherosclerosis (ICD10-I70.0). Electronically Signed   By: Elsie Perone M.D.   On: 05/08/2024 11:52     Malignant neoplasm of urinary bladder [name]) # Stage/metastatic urothelial cancer; [facility] testing was sent on peritoneal biopsies - no targets currently on Pembro+ Erfortumab- MARCH 17th, 2025- CT chest abdomen pelvis reviewed from March 2025 showed positive response to the treatment.  Imaging was also discussed in tumor conference.  No evidence of any disease noted.    # Proceed with Pem+Enfor 1mg /kg- day1 today. Labs-CBC/chemistries were reviewed with the patient.  Patient tolerating well except for skin rash see below.  Will plan PET July 16th, 2025-. Compared with previous PET-CT, generally improved hypermetabolic retroperitoneal and pelvic lymph nodes, consistent with response to treatment. There is a single right pelvic sidewall node which has slightly enlarged compared with the most recent CT and demonstrates mild hypermetabolic activity, suspicious for residual metastatic disease;  New small presacral nodule with mild hypermetabolic activity, suspicious for metastatic disease;  No evidence of metastatic disease in the chest.  # Renal/Hypokalemia- -Potassium 3.3.  Declined oral and IV potassium supplements; supp- potassium rich food.  Continue monitoring.  CKD stage III -however GFR today is lower at 35.  Likely secondary to poor hydration.  Increase fluid intake.   # Weight loss: -Continue to follow with nutrition.  Stable weight.  Defer to consider adding Zyprexa.   # Maculopapular rash: Likely secondary to enfortumab vedotin - Initially, started with grade 3 involving more than 30% BSA with severe pruritus, mild fever.  Significantly improved after dose reduction. Continue with supportive care-Claritin, Aveeno moisturizer and Singulair ;  -Recently, she has been having intermittent flareups on the arm and leg.  Sent Kenalog  0.5% twice daily as needed to the affected areas.  Avoid use on face, groin and axilla- stable.   # elevated TSH but normal- T4- discussed with patient- will monitor for now.   # Mild lower abdominal pain- sec to malignancy- recommend tramadol  as needed. Stable.    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  Continue follow-up with Dr. Alvaro.stable.   # Acid reflux/gas/bloating-advised to take OTC simethicone, Tums, Pepcid  as needed.stable   # IV Access-port placed on 05/03/2023- functional.   PS- # DISPOSITION: # chemo today # as per IS- in 1 week-chemo/labs- # Follow up in 3 weeks- [name]: labs- cbc/cmp; chemo- # in 4 weeks- labs- cbc/bmp; chemo- Dr.B  # I reviewed the blood work- with the patient in detail; also reviewed the imaging independently [as summarized above]; and with the patient in detail.   Above plan of care was discussed with patient/family in detail.  My contact information was given to the patient/family.      Barbara JONELLE Joe, [name] 05/12/2024 10:30 AM

## 2024-05-12 NOTE — Assessment & Plan Note (Signed)
#   Stage/metastatic urothelial cancer; [facility] testing was sent on peritoneal biopsies - no targets currently on Pembro+ Erfortumab- MARCH 17th, 2025- CT chest abdomen pelvis reviewed from March 2025 showed positive response to the treatment.  Imaging was also discussed in tumor conference.  No evidence of any disease noted.    # Proceed with Pem+Enfor 1mg /kg- day1 today. Labs-CBC/chemistries were reviewed with the patient.  Patient tolerating well except for skin rash see below.  Will plan PET July 16th, 2025-. Compared with previous PET-CT, generally improved hypermetabolic retroperitoneal and pelvic lymph nodes, consistent with response to treatment. There is a single right pelvic sidewall node which has slightly enlarged compared with the most recent CT and demonstrates mild hypermetabolic activity, suspicious for residual metastatic disease;  New small presacral nodule with mild hypermetabolic activity, suspicious for metastatic disease;  No evidence of metastatic disease in the chest.  # Renal/Hypokalemia- -Potassium 3.3.  Declined oral and IV potassium supplements; supp- potassium rich food.  Continue monitoring.  CKD stage III -however GFR today is lower at 35.  Likely secondary to poor hydration.  Increase fluid intake.   # Weight loss: -Continue to follow with nutrition.  Stable weight.  Defer to consider adding Zyprexa.   # Maculopapular rash: Likely secondary to enfortumab vedotin - Initially, started with grade 3 involving more than 30% BSA with severe pruritus, mild fever.  Significantly improved after dose reduction. Continue with supportive care-Claritin, Aveeno moisturizer and Singulair ; -Recently, she has been having intermittent flareups on the arm and leg.  Sent Kenalog  0.5% twice daily as needed to the affected areas.  Avoid use on face, groin and axilla- stable.   # elevated TSH but normal- T4- discussed with patient- will monitor for now.   # Mild lower abdominal pain- sec to  malignancy- recommend tramadol  as needed. Stable.    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  Continue follow-up with Dr. Alvaro.stable.   # Acid reflux/gas/bloating-advised to take OTC simethicone, Tums, Pepcid  as needed.stable   # IV Access-port placed on 05/03/2023- functional.   PS- # DISPOSITION: # chemo today # as per IS- in 1 week-chemo/labs- # Follow up in 3 weeks- MD: labs- cbc/cmp; chemo- # in 4 weeks- labs- cbc/bmp; chemo- Dr.B  # I reviewed the blood work- with the patient in detail; also reviewed the imaging independently [as summarized above]; and with the patient in detail.

## 2024-05-12 NOTE — Patient Instructions (Addendum)
 [facility] - A DEPT OF [place]  Discharge Instructions: Thank you for choosing [facility] to provide your oncology and hematology care.  If you have a lab appointment with the Cancer Center, please go directly to the Cancer Center and check in at the registration area.  Wear comfortable clothing and clothing appropriate for easy access to any Portacath or PICC line.   We strive to give you quality time with your provider. You may need to reschedule your appointment if you arrive late (15 or more minutes).  Arriving late affects you and other patients whose appointments are after yours.  Also, if you miss three or more appointments without notifying the office, you may be dismissed from the clinic at the provider's discretion.      For prescription refill requests, have your pharmacy contact our office and allow 72 hours for refills to be completed.    Today you received the following chemotherapy and/or immunotherapy agents PADCEV  and KEYTRUDA       To help prevent nausea and vomiting after your treatment, we encourage you to take your nausea medication as directed.  BELOW ARE SYMPTOMS THAT SHOULD BE REPORTED IMMEDIATELY: *FEVER GREATER THAN 100.4 F (38 C) OR HIGHER *CHILLS OR SWEATING *NAUSEA AND VOMITING THAT IS NOT CONTROLLED WITH YOUR NAUSEA MEDICATION *UNUSUAL SHORTNESS OF BREATH *UNUSUAL BRUISING OR BLEEDING *URINARY PROBLEMS (pain or burning when urinating, or frequent urination) *BOWEL PROBLEMS (unusual diarrhea, constipation, pain near the anus) TENDERNESS IN MOUTH AND THROAT WITH OR WITHOUT PRESENCE OF ULCERS (sore throat, sores in mouth, or a toothache) UNUSUAL RASH, SWELLING OR PAIN  UNUSUAL VAGINAL DISCHARGE OR ITCHING   Items with * indicate a potential emergency and should be followed up as soon as possible or go to the Emergency Department if any problems should occur.  Please show the CHEMOTHERAPY ALERT CARD or  IMMUNOTHERAPY ALERT CARD at check-in to the Emergency Department and triage nurse.  Should you have questions after your visit or need to cancel or reschedule your appointment, please contact [facility] - A DEPT OF [facility]  [phone] and follow the prompts.  Office hours are 8:00 a.m. to 4:30 p.m. Monday - Friday. Please note that voicemails left after 4:00 p.m. may not be returned until the following business day.  We are closed weekends and major holidays. You have access to a nurse at all times for urgent questions. Please call the main number to the clinic [phone] and follow the prompts.  For any non-urgent questions, you may also contact your provider using MyChart. We now offer e-Visits for anyone 6 and older to request care online for non-urgent symptoms. For details visit [email or web address].   Also download the MyChart app! Go to the app store, search MyChart, open the app, select [facility], and log in with your MyChart username and password.  Enfortumab Vedotin  Injection What is this medication? ENFORTUMAB VEDOTIN  (en FORT ue mab ve DOE tin) treats bladder cancer and kidney cancer. It works by blocking a protein that causes cancer cells to grow and multiply. This helps to slow or stop the spread of cancer cells. This medicine may be used for other purposes; ask your health care provider or pharmacist if you have questions. COMMON BRAND NAME(S): PADCEV  What should I tell my care team before I take this medication? They need to know if you have any of these conditions: Diabetes Eye disease  Liver disease Lung disease Tingling of the fingers or toes or other nerve disorder Vision problems An unusual or allergic reaction to enfortumab vedotin , other medications, foods, dyes, or preservatives Pregnant or trying to get pregnant Breast-feeding How should I use this medication? This medication is injected into a vein. It is given by  your care team in a hospital or clinic setting. Talk to your care team about the use of this medication in children. Special care may be needed. Overdosage: If you think you have taken too much of this medicine contact a poison control center or emergency room at once. NOTE: This medicine is only for you. Do not share this medicine with others. What if I miss a dose? Keep appointments for follow-up doses. It is important not to miss your dose. Call your care team if you are unable to keep an appointment. What may interact with this medication? This medication may affect how other medications work, and other medications may affect how this medication works. Talk with your care team about all of the medications you take. They may suggest changes to your treatment plan to lower the risk of side effects and to make sure your medications work as intended. This list may not describe all possible interactions. Give your health care provider a list of all the medicines, herbs, non-prescription drugs, or dietary supplements you use. Also tell them if you smoke, drink alcohol , or use illegal drugs. Some items may interact with your medicine. What should I watch for while using this medication? Your condition will be monitored carefully while you are receiving this medication. This medication may make you feel generally unwell. This is not uncommon as chemotherapy can affect healthy cells as well as cancer cells. Report any side effects. Continue your course of treatment even though you feel ill unless your care team tells you to stop. This medication may increase blood sugar. The risk may be higher in patients who already have diabetes. Ask your care team what you can do to lower your risk of diabetes while taking this medication. This medication can cause a serious condition in which there is too much acid in your blood. If you develop nausea, vomiting, stomach pain, unusual tiredness, or trouble breathing, stop  taking this medication and call your care team right away. If possible, use a ketone dipstick to check for ketones in your urine. This medication may cause dry eyes and blurred vision. If you wear contact lenses, you may feel some discomfort. Lubricating eye drops may help. See your care team if the problem does not go away or is severe. Tell your care team right away if you have any change in your eyesight. This medication may increase your risk of getting an infection. Call your care team for advice if you get a fever, chills, sore throat, or other symptoms of a cold or flu. Do not treat yourself. Try to avoid being around people who are sick. Avoid taking medications that contain aspirin, acetaminophen , ibuprofen, naproxen, or ketoprofen unless instructed by your care team. These medications may hide a fever. This medication may cause serious skin reactions. They can happen weeks to months after starting the medication. Contact your care team right away if you notice fevers or flu-like symptoms with a rash. The rash may be red or purple and then turn into blisters or peeling of the skin. You may also notice a red rash with swelling of the face, lips or lymph nodes in your neck or under  your arms. Talk to your care team if you or your partner may be pregnant. Serious birth defects can occur if you take this medication during pregnancy and for 2 months after the last dose. You will need a negative pregnancy test before starting this medication. Contraception is recommended while taking this medication and for 2 months after the last dose. Your care team can help you find the option that works for you. If your partner can get pregnant, use a condom during sex while taking this medication and for 4 months after the last dose. Do not breastfeed while taking this medicine or for at least 3 weeks after the last dose. This medication may cause infertility. Talk to your care team if you are concerned about your  fertility. What side effects may I notice from receiving this medication? Side effects that you should report to your care team as soon as possible: Allergic reactions--skin rash, itching, hives, swelling of the face, lips, tongue, or throat Dry cough, shortness of breath or trouble breathing Eye pain, redness, irritation, or discharge with blurry or decreased vision High blood sugar (hyperglycemia)--increased thirst or amount of urine, unusual weakness or fatigue, blurry vision Painful swelling, warmth, or redness of the skin, blisters or sores at the infusion site Pain, tingling, or numbness in the hands or feet Redness, blistering, peeling, or loosening of the skin, including inside the mouth Unusual bruising or bleeding Side effects that usually do not require medical attention (report these to your care team if they continue or are bothersome): Change in taste Diarrhea Dry eyes Fatigue Hair loss Loss of appetite This list may not describe all possible side effects. Call your doctor for medical advice about side effects. You may report side effects to FDA at 1-800-FDA-1088. Where should I keep my medication? This medication is given in a hospital or clinic. It will not be stored at home. NOTE: This sheet is a summary. It may not cover all possible information. If you have questions about this medicine, talk to your doctor, pharmacist, or health care provider.  2024 Elsevier/Gold Standard (2022-02-20 00:00:00)  Pembrolizumab  Injection What is this medication? PEMBROLIZUMAB  (PEM broe LIZ ue mab) treats some types of cancer. It works by helping your immune system slow or stop the spread of cancer cells. It is a monoclonal antibody. This medicine may be used for other purposes; ask your health care provider or pharmacist if you have questions. COMMON BRAND NAME(S): Keytruda  What should I tell my care team before I take this medication? They need to know if you have any of these  conditions: Allogeneic stem cell transplant (uses someone else's stem cells) Autoimmune diseases, such as Crohn disease, ulcerative colitis, lupus History of chest radiation Nervous system problems, such as Guillain-Barre syndrome, myasthenia gravis Organ transplant An unusual or allergic reaction to pembrolizumab , other medications, foods, dyes, or preservatives Pregnant or trying to get pregnant Breast-feeding How should I use this medication? This medication is injected into a vein. It is given by your care team in a hospital or clinic setting. A special MedGuide will be given to you before each treatment. Be sure to read this information carefully each time. Talk to your care team about the use of this medication in children. While it may be prescribed for children as young as 6 months for selected conditions, precautions do apply. Overdosage: If you think you have taken too much of this medicine contact a poison control center or emergency room at once. NOTE: This medicine  is only for you. Do not share this medicine with others. What if I miss a dose? Keep appointments for follow-up doses. It is important not to miss your dose. Call your care team if you are unable to keep an appointment. What may interact with this medication? Interactions have not been studied. This list may not describe all possible interactions. Give your health care provider a list of all the medicines, herbs, non-prescription drugs, or dietary supplements you use. Also tell them if you smoke, drink alcohol , or use illegal drugs. Some items may interact with your medicine. What should I watch for while using this medication? Your condition will be monitored carefully while you are receiving this medication. You may need blood work while taking this medication. This medication may cause serious skin reactions. They can happen weeks to months after starting the medication. Contact your care team right away if you notice  fevers or flu-like symptoms with a rash. The rash may be red or purple and then turn into blisters or peeling of the skin. You may also notice a red rash with swelling of the face, lips, or lymph nodes in your neck or under your arms. Tell your care team right away if you have any change in your eyesight. Talk to your care team if you may be pregnant. Serious birth defects can occur if you take this medication during pregnancy and for 4 months after the last dose. You will need a negative pregnancy test before starting this medication. Contraception is recommended while taking this medication and for 4 months after the last dose. Your care team can help you find the option that works for you. Do not breastfeed while taking this medication and for 4 months after the last dose. What side effects may I notice from receiving this medication? Side effects that you should report to your care team as soon as possible: Allergic reactions--skin rash, itching, hives, swelling of the face, lips, tongue, or throat Dry cough, shortness of breath or trouble breathing Eye pain, redness, irritation, or discharge with blurry or decreased vision Heart muscle inflammation--unusual weakness or fatigue, shortness of breath, chest pain, fast or irregular heartbeat, dizziness, swelling of the ankles, feet, or hands Hormone gland problems--headache, sensitivity to light, unusual weakness or fatigue, dizziness, fast or irregular heartbeat, increased sensitivity to cold or heat, excessive sweating, constipation, hair loss, increased thirst or amount of urine, tremors or shaking, irritability Infusion reactions--chest pain, shortness of breath or trouble breathing, feeling faint or lightheaded Kidney injury (glomerulonephritis)--decrease in the amount of urine, red or dark brown urine, foamy or bubbly urine, swelling of the ankles, hands, or feet Liver injury--right upper belly pain, loss of appetite, nausea, light-colored stool,  dark yellow or brown urine, yellowing skin or eyes, unusual weakness or fatigue Pain, tingling, or numbness in the hands or feet, muscle weakness, change in vision, confusion or trouble speaking, loss of balance or coordination, trouble walking, seizures Rash, fever, and swollen lymph nodes Redness, blistering, peeling, or loosening of the skin, including inside the mouth Sudden or severe stomach pain, bloody diarrhea, fever, nausea, vomiting Side effects that usually do not require medical attention (report to your care team if they continue or are bothersome): Bone, joint, or muscle pain Diarrhea Fatigue Loss of appetite Nausea Skin rash This list may not describe all possible side effects. Call your doctor for medical advice about side effects. You may report side effects to FDA at 1-800-FDA-1088. Where should I keep my medication? This medication  is given in a hospital or clinic. It will not be stored at home. NOTE: This sheet is a summary. It may not cover all possible information. If you have questions about this medicine, talk to your doctor, pharmacist, or health care provider.  2024 Elsevier/Gold Standard (2022-02-20 00:00:00)  [phone]

## 2024-05-12 NOTE — Progress Notes (Signed)
 Patient states she would like to know if she is able to take vitamins while on immunotherapy. She also brought papers from insurance company regarding the medications she's been taking. Other than that she presents to be doing well with no new or acute concerns.

## 2024-05-19 ENCOUNTER — Inpatient Hospital Stay

## 2024-05-19 VITALS — BP 108/60 | HR 60 | Temp 95.9°F | Resp 18

## 2024-05-19 DIAGNOSIS — Z5112 Encounter for antineoplastic immunotherapy: Secondary | ICD-10-CM | POA: Diagnosis not present

## 2024-05-19 DIAGNOSIS — C679 Malignant neoplasm of bladder, unspecified: Secondary | ICD-10-CM

## 2024-05-19 LAB — CMP (CANCER CENTER ONLY)
ALT: 9 U/L (ref 0–44)
AST: 22 U/L (ref 15–41)
Albumin: 3.8 g/dL (ref 3.5–5.0)
Alkaline Phosphatase: 43 U/L (ref 38–126)
Anion gap: 5 (ref 5–15)
BUN: 20 mg/dL (ref 8–23)
CO2: 23 mmol/L (ref 22–32)
Calcium: 8.5 mg/dL — ABNORMAL LOW (ref 8.9–10.3)
Chloride: 107 mmol/L (ref 98–111)
Creatinine: 1.38 mg/dL — ABNORMAL HIGH (ref 0.44–1.00)
GFR, Estimated: 43 mL/min — ABNORMAL LOW (ref >60)
Glucose, Bld: 92 mg/dL (ref 70–99)
Potassium: 3.6 mmol/L (ref 3.5–5.1)
Sodium: 135 mmol/L (ref 135–145)
Total Bilirubin: 0.6 mg/dL (ref 0.0–1.2)
Total Protein: 7.1 g/dL (ref 6.5–8.1)

## 2024-05-19 LAB — CBC WITH DIFFERENTIAL (CANCER CENTER ONLY)
Abs Immature Granulocytes: 0 K/uL (ref 0.00–0.07)
Basophils Absolute: 0 K/uL (ref 0.0–0.1)
Basophils Relative: 1 %
Eosinophils Absolute: 0.5 K/uL (ref 0.0–0.5)
Eosinophils Relative: 10 %
HCT: 35.3 % — ABNORMAL LOW (ref 36.0–46.0)
Hemoglobin: 11.8 g/dL — ABNORMAL LOW (ref 12.0–15.0)
Immature Granulocytes: 0 %
Lymphocytes Relative: 43 %
Lymphs Abs: 2.2 K/uL (ref 0.7–4.0)
MCH: 32.2 pg (ref 26.0–34.0)
MCHC: 33.4 g/dL (ref 30.0–36.0)
MCV: 96.4 fL (ref 80.0–100.0)
Monocytes Absolute: 0.6 K/uL (ref 0.1–1.0)
Monocytes Relative: 11 %
Neutro Abs: 1.8 K/uL (ref 1.7–7.7)
Neutrophils Relative %: 35 %
Platelet Count: 197 K/uL (ref 150–400)
RBC: 3.66 MIL/uL — ABNORMAL LOW (ref 3.87–5.11)
RDW: 14.3 % (ref 11.5–15.5)
WBC Count: 5.1 K/uL (ref 4.0–10.5)
nRBC: 0 % (ref 0.0–0.2)

## 2024-05-19 MED ORDER — SODIUM CHLORIDE 0.9 % IV SOLN
1.0000 mg/kg | Freq: Once | INTRAVENOUS | Status: AC
  Administered 2024-05-19: 50 mg via INTRAVENOUS
  Filled 2024-05-19: qty 3

## 2024-05-19 MED ORDER — SODIUM CHLORIDE 0.9 % IV SOLN
INTRAVENOUS | Status: DC
Start: 2024-05-19 — End: 2024-05-19
  Filled 2024-05-19: qty 250

## 2024-05-19 MED ORDER — HEPARIN SOD (PORK) LOCK FLUSH 100 UNIT/ML IV SOLN
500.0000 [IU] | Freq: Once | INTRAVENOUS | Status: AC | PRN
Start: 2024-05-19 — End: 2024-05-19
  Administered 2024-05-19: 500 [IU]
  Filled 2024-05-19: qty 5

## 2024-05-19 MED ORDER — PROCHLORPERAZINE MALEATE 10 MG PO TABS
10.0000 mg | ORAL_TABLET | Freq: Once | ORAL | Status: AC
  Administered 2024-05-19: 10 mg via ORAL
  Filled 2024-05-19: qty 1

## 2024-05-19 NOTE — Patient Instructions (Signed)
 [facility] - A DEPT OF [facility]  Discharge Instructions: Thank you for choosing [facility] to provide your oncology and hematology care.  If you have a lab appointment with the Cancer Center, please go directly to the Cancer Center and check in at the registration area.  Wear comfortable clothing and clothing appropriate for easy access to any Portacath or PICC line.   We strive to give you quality time with your provider. You may need to reschedule your appointment if you arrive late (15 or more minutes).  Arriving late affects you and other patients whose appointments are after yours.  Also, if you miss three or more appointments without notifying the office, you may be dismissed from the clinic at the provider's discretion.      For prescription refill requests, have your pharmacy contact our office and allow 72 hours for refills to be completed.    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  BELOW ARE SYMPTOMS THAT SHOULD BE REPORTED IMMEDIATELY: *FEVER GREATER THAN 100.4 F (38 C) OR HIGHER *CHILLS OR SWEATING *NAUSEA AND VOMITING THAT IS NOT CONTROLLED WITH YOUR NAUSEA MEDICATION *UNUSUAL SHORTNESS OF BREATH *UNUSUAL BRUISING OR BLEEDING *URINARY PROBLEMS (pain or burning when urinating, or frequent urination) *BOWEL PROBLEMS (unusual diarrhea, constipation, pain near the anus) TENDERNESS IN MOUTH AND THROAT WITH OR WITHOUT PRESENCE OF ULCERS (sore throat, sores in mouth, or a toothache) UNUSUAL RASH, SWELLING OR PAIN  UNUSUAL VAGINAL DISCHARGE OR ITCHING   Items with * indicate a potential emergency and should be followed up as soon as possible or go to the Emergency Department if any problems should occur.  Please show the CHEMOTHERAPY ALERT CARD or IMMUNOTHERAPY ALERT  CARD at check-in to the Emergency Department and triage nurse.  Should you have questions after your visit or need to cancel or reschedule your appointment, please contact [facility] - A DEPT OF [facility]  [phone] and follow the prompts.  Office hours are 8:00 a.m. to 4:30 p.m. Monday - Friday. Please note that voicemails left after 4:00 p.m. may not be returned until the following business day.  We are closed weekends and major holidays. You have access to a nurse at all times for urgent questions. Please call the main number to the clinic [phone] and follow the prompts.  For any non-urgent questions, you may also contact your provider using MyChart. We now offer e-Visits for anyone 99 and older to request care online for non-urgent symptoms. For details visit [email or web address].   Also download the MyChart app! Go to the app store, search "MyChart", open the app, select [facility], and log in with your MyChart username and password.  Enfortumab Vedotin  Injection What is this medication? ENFORTUMAB VEDOTIN  (en FORT ue mab ve DOE tin) treats bladder cancer and kidney cancer. It works by blocking a protein that causes cancer cells to grow and multiply. This helps to slow or stop the spread of cancer cells. This medicine may be used for other purposes; ask your health care provider or pharmacist if you have questions. COMMON BRAND NAME(S): PADCEV  What should I tell my care team before I take this medication? They need to know if you have any of these conditions: Diabetes Eye disease Liver disease  Lung disease Tingling of the fingers or toes or other nerve disorder Vision problems An unusual or allergic reaction to enfortumab vedotin , other medications, foods, dyes, or preservatives Pregnant or trying to get pregnant Breast-feeding How should I use this medication? This medication is injected into a vein. It is given by your care team in a  hospital or clinic setting. Talk to your care team about the use of this medication in children. Special care may be needed. Overdosage: If you think you have taken too much of this medicine contact a poison control center or emergency room at once. NOTE: This medicine is only for you. Do not share this medicine with others. What if I miss a dose? Keep appointments for follow-up doses. It is important not to miss your dose. Call your care team if you are unable to keep an appointment. What may interact with this medication? This medication may affect how other medications work, and other medications may affect how this medication works. Talk with your care team about all of the medications you take. They may suggest changes to your treatment plan to lower the risk of side effects and to make sure your medications work as intended. This list may not describe all possible interactions. Give your health care provider a list of all the medicines, herbs, non-prescription drugs, or dietary supplements you use. Also tell them if you smoke, drink alcohol , or use illegal drugs. Some items may interact with your medicine. What should I watch for while using this medication? Your condition will be monitored carefully while you are receiving this medication. This medication may make you feel generally unwell. This is not uncommon as chemotherapy can affect healthy cells as well as cancer cells. Report any side effects. Continue your course of treatment even though you feel ill unless your care team tells you to stop. This medication may increase blood sugar. The risk may be higher in patients who already have diabetes. Ask your care team what you can do to lower your risk of diabetes while taking this medication. This medication can cause a serious condition in which there is too much acid in your blood. If you develop nausea, vomiting, stomach pain, unusual tiredness, or trouble breathing, stop taking this  medication and call your care team right away. If possible, use a ketone dipstick to check for ketones in your urine. This medication may cause dry eyes and blurred vision. If you wear contact lenses, you may feel some discomfort. Lubricating eye drops may help. See your care team if the problem does not go away or is severe. Tell your care team right away if you have any change in your eyesight. This medication may increase your risk of getting an infection. Call your care team for advice if you get a fever, chills, sore throat, or other symptoms of a cold or flu. Do not treat yourself. Try to avoid being around people who are sick. Avoid taking medications that contain aspirin, acetaminophen , ibuprofen, naproxen, or ketoprofen unless instructed by your care team. These medications may hide a fever. This medication may cause serious skin reactions. They can happen weeks to months after starting the medication. Contact your care team right away if you notice fevers or flu-like symptoms with a rash. The rash may be red or purple and then turn into blisters or peeling of the skin. You may also notice a red rash with swelling of the face, lips or lymph nodes in your neck or under your arms.  Talk to your care team if you or your partner may be pregnant. Serious birth defects can occur if you take this medication during pregnancy and for 2 months after the last dose. You will need a negative pregnancy test before starting this medication. Contraception is recommended while taking this medication and for 2 months after the last dose. Your care team can help you find the option that works for you. If your partner can get pregnant, use a condom during sex while taking this medication and for 4 months after the last dose. Do not breastfeed while taking this medicine or for at least 3 weeks after the last dose. This medication may cause infertility. Talk to your care team if you are concerned about your  fertility. What side effects may I notice from receiving this medication? Side effects that you should report to your care team as soon as possible: Allergic reactions--skin rash, itching, hives, swelling of the face, lips, tongue, or throat Dry cough, shortness of breath or trouble breathing Eye pain, redness, irritation, or discharge with blurry or decreased vision High blood sugar (hyperglycemia)--increased thirst or amount of urine, unusual weakness or fatigue, blurry vision Painful swelling, warmth, or redness of the skin, blisters or sores at the infusion site Pain, tingling, or numbness in the hands or feet Redness, blistering, peeling, or loosening of the skin, including inside the mouth Unusual bruising or bleeding Side effects that usually do not require medical attention (report these to your care team if they continue or are bothersome): Change in taste Diarrhea Dry eyes Fatigue Hair loss Loss of appetite This list may not describe all possible side effects. Call your doctor for medical advice about side effects. You may report side effects to FDA at 1-800-FDA-1088. Where should I keep my medication? This medication is given in a hospital or clinic. It will not be stored at home. NOTE: This sheet is a summary. It may not cover all possible information. If you have questions about this medicine, talk to your doctor, pharmacist, or health care provider.  2024 [employer]/Gold Standard (2022-02-20 00:00:00)

## 2024-05-19 NOTE — Patient Instructions (Signed)

## 2024-05-28 ENCOUNTER — Encounter: Payer: Self-pay | Admitting: Internal Medicine

## 2024-06-02 ENCOUNTER — Encounter: Payer: Self-pay | Admitting: Internal Medicine

## 2024-06-02 ENCOUNTER — Inpatient Hospital Stay: Attending: Internal Medicine

## 2024-06-02 ENCOUNTER — Inpatient Hospital Stay: Admitting: Internal Medicine

## 2024-06-02 ENCOUNTER — Inpatient Hospital Stay

## 2024-06-02 VITALS — BP 115/79 | HR 72 | Temp 98.6°F | Resp 20 | Wt 106.0 lb

## 2024-06-02 DIAGNOSIS — Z79899 Other long term (current) drug therapy: Secondary | ICD-10-CM | POA: Insufficient documentation

## 2024-06-02 DIAGNOSIS — C679 Malignant neoplasm of bladder, unspecified: Secondary | ICD-10-CM | POA: Insufficient documentation

## 2024-06-02 DIAGNOSIS — Z5112 Encounter for antineoplastic immunotherapy: Secondary | ICD-10-CM | POA: Diagnosis present

## 2024-06-02 DIAGNOSIS — Z87891 Personal history of nicotine dependence: Secondary | ICD-10-CM | POA: Diagnosis not present

## 2024-06-02 LAB — CBC WITH DIFFERENTIAL (CANCER CENTER ONLY)
Abs Immature Granulocytes: 0.01 K/uL (ref 0.00–0.07)
Basophils Absolute: 0.1 K/uL (ref 0.0–0.1)
Basophils Relative: 1 %
Eosinophils Absolute: 0.6 K/uL — ABNORMAL HIGH (ref 0.0–0.5)
Eosinophils Relative: 11 %
HCT: 35.7 % — ABNORMAL LOW (ref 36.0–46.0)
Hemoglobin: 12.1 g/dL (ref 12.0–15.0)
Immature Granulocytes: 0 %
Lymphocytes Relative: 44 %
Lymphs Abs: 2.6 K/uL (ref 0.7–4.0)
MCH: 32.6 pg (ref 26.0–34.0)
MCHC: 33.9 g/dL (ref 30.0–36.0)
MCV: 96.2 fL (ref 80.0–100.0)
Monocytes Absolute: 0.6 K/uL (ref 0.1–1.0)
Monocytes Relative: 11 %
Neutro Abs: 1.9 K/uL (ref 1.7–7.7)
Neutrophils Relative %: 33 %
Platelet Count: 237 K/uL (ref 150–400)
RBC: 3.71 MIL/uL — ABNORMAL LOW (ref 3.87–5.11)
RDW: 14.2 % (ref 11.5–15.5)
WBC Count: 5.8 K/uL (ref 4.0–10.5)
nRBC: 0 % (ref 0.0–0.2)

## 2024-06-02 LAB — CMP (CANCER CENTER ONLY)
ALT: 9 U/L (ref 0–44)
AST: 19 U/L (ref 15–41)
Albumin: 3.7 g/dL (ref 3.5–5.0)
Alkaline Phosphatase: 50 U/L (ref 38–126)
Anion gap: 7 (ref 5–15)
BUN: 21 mg/dL (ref 8–23)
CO2: 23 mmol/L (ref 22–32)
Calcium: 8.9 mg/dL (ref 8.9–10.3)
Chloride: 107 mmol/L (ref 98–111)
Creatinine: 1.33 mg/dL — ABNORMAL HIGH (ref 0.44–1.00)
GFR, Estimated: 45 mL/min — ABNORMAL LOW (ref >60)
Glucose, Bld: 82 mg/dL (ref 70–99)
Potassium: 3.5 mmol/L (ref 3.5–5.1)
Sodium: 137 mmol/L (ref 135–145)
Total Bilirubin: 0.5 mg/dL (ref 0.0–1.2)
Total Protein: 7.3 g/dL (ref 6.5–8.1)

## 2024-06-02 MED ORDER — LEVOTHYROXINE SODIUM 50 MCG PO TABS: 50.0000 ug | ORAL_TABLET | Freq: Every day | ORAL | 3 refills | Status: DC

## 2024-06-02 MED ORDER — SODIUM CHLORIDE 0.9 % IV SOLN
200.0000 mg | Freq: Once | INTRAVENOUS | Status: AC
  Administered 2024-06-02 (×2): 200 mg via INTRAVENOUS
  Filled 2024-06-02: qty 8

## 2024-06-02 MED ORDER — SODIUM CHLORIDE 0.9 % IV SOLN
1.0000 mg/kg | Freq: Once | INTRAVENOUS | Status: AC
  Administered 2024-06-02 (×2): 50 mg via INTRAVENOUS
  Filled 2024-06-02: qty 3

## 2024-06-02 MED ORDER — SODIUM CHLORIDE 0.9 % IV SOLN
INTRAVENOUS | Status: DC
  Filled 2024-06-02: qty 250

## 2024-06-02 MED ORDER — PROCHLORPERAZINE MALEATE 10 MG PO TABS
10.0000 mg | ORAL_TABLET | Freq: Once | ORAL | Status: AC
  Administered 2024-06-02 (×2): 10 mg via ORAL
  Filled 2024-06-02: qty 1

## 2024-06-02 NOTE — Assessment & Plan Note (Addendum)
#   Stage/metastatic urothelial cancer; [facility] testing was sent on peritoneal biopsies - no targets currently on Pembro+ Erfortumab- MARCH 17th, 2025- CT chest abdomen pelvis reviewed from March 2025 showed positive response to the treatment.  Imaging was also discussed in tumor conference.  No evidence of any disease noted.    # Proceed with Pem+Enfor 1mg /kg- day1 today. Labs-CBC/chemistries were reviewed with the patient.  Patient tolerating well except for skin rash see below.   PET July 16th, 2025-. Compared with previous PET-CT, generally improved hypermetabolic retroperitoneal and pelvic lymph nodes, consistent with response to treatment. There is a single right pelvic sidewall node which has slightly enlarged compared with the most recent CT and demonstrates mild hypermetabolic activity, suspicious for residual metastatic disease;  New small presacral nodule with mild hypermetabolic activity, suspicious for metastatic disease;  No evidence of metastatic disease in the chest. Will repeat scan in NOV-DEC 2025.   # Renal/Hypokalemia- -Potassium 3.3.  Declined oral and IV potassium supplements; supp- potassium rich food.  Continue monitoring.  CKD stage III -however GFR today is lower at 35.  Likely secondary to poor hydration.  Increase fluid intake.  # Elevated TSH but normal- T4- discussed with patient- will monitor for now.  Hypothyroidism sec to jerome- [AUG 2025]- start synthroid  50 mcg/day-    # Weight loss: -Continue to follow with nutrition.  Poor taste- wants to HOLD off Zyprexa.   # Maculopapular rash: Likely secondary to enfortumab vedotin - Initially, started with grade 3 involving more than 30% BSA with severe pruritus, mild fever.  Significantly improved after dose reduction. Continue with supportive care-Claritin, Aveeno moisturizer and Singulair ; Kenalog  0.5% twice daily as needed to the affected areas- stable.  # Mild lower abdominal pain- sec to malignancy- recommend tramadol  as  needed. Stable.    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  Continue follow-up with Dr. Alvaro.stable.   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  Order Thyroid  profile in sep PS- # DISPOSITION: # chemo today # as per IS- in 1 week-chemo/labs- # Follow up in 3 weeks- MD: labs- cbc/cmp; chemo- # in 4 weeks- labs- cbc/bmp; chemo- Dr.B

## 2024-06-02 NOTE — Progress Notes (Signed)
 Patient has no concerns

## 2024-06-02 NOTE — Progress Notes (Signed)
 [facility] CONSULT NOTE  Patient Care Team: McDonough, Tinnie [name] as PCP - General (Physician Assistant) Rennie Cindy SAUNDERS, MD as Consulting Physician (Oncology)  CHIEF COMPLAINTS/PURPOSE OF CONSULTATION: Bladder cancer  Oncology History Overview Note  # Metastatic urothelial carcinoma - Patient developed urinary symptoms around September or October 2023.  Was treated with multiple rounds of antibiotics with no improvement.  She was then referred to Dr. Penne for further evaluation.   - s/p TURBT with left ureteral stent placement by Dr. Penne.  IntraOp findings showed large irregular mucosal submucosal extensive tumor from the left dome, left lateral bladder wall majority of trigone including the left UO and bladder neck.  Measures at least 5 x 5 cm. Pathology showed invasive high-grade urothelial carcinoma. Invades muscularis propria.   - Completed neoadjuvant cycle 4-day 1 of split dose cisplatin  and gemcitabine  (07/23/2023). Pt declined C4D8 due to poor tolerance/nausea/flank pain.  Split dose cisplatin  considered for CKD stage III.   - s/p robotic adhesiolysis + peritoneal biopsies 09/25/23 showed urothelial cancer with peritoneal carcinomatosis and small + large bowel serosal involvement. Original plan was cystectomy and urinary diversion but this was aborted based on intra-op findings.   # JAN 14th, 2025- Start Pem+Erfo   Urothelial cancer (HCC)  04/12/2023 Initial Diagnosis   Urothelial cancer (HCC)   04/12/2023 Cancer Staging   Staging form: Kidney, AJCC 8th Edition - Clinical: Stage III (cT3, cN0, cM0) - Signed by Agrawal, Kavita, MD on 05/07/2023 Stage prefix: Initial diagnosis   09/25/2023 Cancer Staging   Staging form: Kidney, AJCC 8th Edition - Pathologic stage from 09/25/2023: Stage IV (pM1) - Signed by Clista Bimler, MD on 10/25/2023   Malignant neoplasm of urinary bladder (HCC)  04/22/2023 Initial Diagnosis   Malignant neoplasm of urinary bladder  (HCC)   05/07/2023 - 07/23/2023 Chemotherapy   Patient is on Treatment Plan : BLADDER Gemcitabine  D1,8 + Cisplatin  (split dose) D1,8 q21d x 4 cycles     11/05/2023 -  Chemotherapy   Patient is on Treatment Plan : UROTHELIAL ADVANCED, METASTATIC ENFORTUMAB D1, D8 + PEMBROLIZUMAB  (200) D1 Q21D       HISTORY OF PRESENTING ILLNESS: Patient ambulating-independently.   Alone.   [name] 63 y.o.  female pleasant patient with stage IV/metastatic urothelial cancer-currently on pembrolizumab  plus Enfortumab is here for follow-up.  Patient continues eye moisturizing/drops advised. Complaints of poor taste.  There is no weight loss.  Continues to have mild nausea, using the compazine .   C/o rash on overall body, itchy-overall stable on current regimen. Complains of fatigue. Mild lower abdominal pain.   Review of Systems  Constitutional:  Positive for malaise/fatigue. Negative for chills, diaphoresis, fever and weight loss.  HENT:  Negative for nosebleeds and sore throat.   Eyes:  Negative for double vision.  Respiratory:  Negative for cough, hemoptysis, sputum production, shortness of breath and wheezing.   Cardiovascular:  Negative for chest pain, palpitations, orthopnea and leg swelling.  Gastrointestinal:  Negative for abdominal pain, blood in stool, constipation, diarrhea, heartburn, melena, nausea and vomiting.  Genitourinary:  Negative for dysuria, frequency and urgency.  Musculoskeletal:  Negative for back pain and joint pain.  Skin:  Positive for itching and rash.  Neurological:  Negative for dizziness, tingling, focal weakness, weakness and headaches.  Endo/Heme/Allergies:  Does not bruise/bleed easily.  Psychiatric/Behavioral:  Negative for depression. The patient is not nervous/anxious and does not have insomnia.     MEDICAL HISTORY:  Past Medical History:  Diagnosis Date  Adopted    Allergic rhinitis    Anemia    h/o   Anxiety    Arthritis    B12 deficiency     Bilateral carotid artery stenosis    Bilateral carotid bruits    Bladder tumor    Cancer (HCC)    bladder cancer   Depression    Gallbladder polyp    GERD (gastroesophageal reflux disease)    Heart murmur    Hydronephrosis of left kidney    Migraines    Tobacco abuse    UTI (urinary tract infection)     SURGICAL HISTORY: Past Surgical History:  Procedure Laterality Date   ABDOMINAL HYSTERECTOMY     BLADDER SURGERY     BREAST BIOPSY Left 2014   neg   CYSTOSCOPY W/ RETROGRADES Bilateral 01/29/2024   Procedure: CYSTOSCOPY, WITH RETROGRADE PYELOGRAM;  Surgeon: Alvaro Ricardo KATHEE Mickey., MD;  Location: [facility];  Service: Urology;  Laterality: Bilateral;   CYSTOSCOPY W/ URETERAL STENT PLACEMENT Left 04/04/2023   Procedure: CYSTOSCOPY WITH RETROGRADE PYELOGRAM/URETERAL STENT PLACEMENT;  Surgeon: Penne Knee, MD;  Location: [facility];  Service: Urology;  Laterality: Left;   CYSTOSCOPY WITH STENT PLACEMENT Left 01/29/2024   Procedure: CYSTOSCOPY, WITH STENT INSERTION;  Surgeon: Alvaro Ricardo KATHEE Mickey., MD;  Location: [facility];  Service: Urology;  Laterality: Left;   FOOT SURGERY Right    2009   IR IMAGING GUIDED PORT INSERTION  05/03/2023   LAPAROSCOPIC APPENDECTOMY  12/05/2012   NASAL SEPTUM SURGERY     ROBOT ASSISTED LAPAROSCOPIC COMPLETE CYSTECT ILEAL CONDUIT N/A 09/25/2023   Procedure: XI ROBOTIC ASSISTED LAPAROSCOPIC EXTENSIVE LYSIS OF ADHESIONS, PERITONEAL BIOPSIES;  Surgeon: Alvaro Ricardo KATHEE Mickey., MD;  Location: [facility];  Service: Urology;  Laterality: N/A;   SISTRUNK PROCEDURE  03/03/2012   Thyroglossal duct cyst excision   TONSILLECTOMY     TRANSURETHRAL RESECTION OF BLADDER TUMOR N/A 04/04/2023   Procedure: TRANSURETHRAL RESECTION OF BLADDER TUMOR (TURBT);  Surgeon: Penne Knee, MD;  Location: [facility];  Service: Urology;  Laterality: N/A;    SOCIAL HISTORY: Social History   Socioeconomic History   Marital status: Married    Spouse name: Not on file   Number of children:  Not on file   Years of education: Not on file   Highest education level: Not on file  Occupational History   Not on file  Tobacco Use   Smoking status: Former    Current packs/day: 0.25    Average packs/day: 0.3 packs/day for 30.0 years (7.5 ttl pk-yrs)    Types: Cigarettes    Passive exposure: Past   Smokeless tobacco: Never  Vaping Use   Vaping status: Never Used  Substance and Sexual Activity   Alcohol  use: Never   Drug use: Never   Sexual activity: Not on file  Other Topics Concern   Not on file  Social History Narrative   Not on file   Social Drivers of Health   Financial Resource Strain: Not on file  Food Insecurity: No Food Insecurity (09/25/2023)   Hunger Vital Sign    Worried About Running Out of Food in the Last Year: Never true    Ran Out of Food in the Last Year: Never true  Transportation Needs: No Transportation Needs (09/25/2023)   PRAPARE - Administrator, Civil Service (Medical): No    Lack of Transportation (Non-Medical): No  Physical Activity: Not on file  Stress: Not on file  Social Connections: Not on file  Intimate  Partner Violence: Not At Risk (09/25/2023)   Humiliation, Afraid, Rape, and Kick questionnaire    Fear of Current or Ex-Partner: No    Emotionally Abused: No    Physically Abused: No    Sexually Abused: No    FAMILY HISTORY: Family History  Adopted: Yes    ALLERGIES:  is allergic to pseudoephedrine hcl.  MEDICATIONS:  Current Outpatient Medications  Medication Sig Dispense Refill   acetaminophen  (TYLENOL ) 500 MG tablet Take 1,000 mg by mouth every 6 (six) hours as needed for moderate pain (pain score 4-6).     carboxymethylcellulose (REFRESH PLUS) 0.5 % SOLN Place 1 drop into both eyes 3 (three) times daily as needed (dry eyes).     carisoprodol  (SOMA ) 350 MG tablet Take 1 tablet (350 mg total) by mouth at bedtime as needed for muscle spasms. 90 tablet 0   dicyclomine  (BENTYL ) 10 MG capsule Take 1 capsule (10 mg total)  by mouth 3 (three) times daily as needed for up to 30 doses for spasms. 30 capsule 0   escitalopram  (LEXAPRO ) 5 MG tablet Take 1 tablet (5 mg total) by mouth daily. 90 tablet 1   estradiol  (ESTRACE ) 1 MG tablet TAKE 1 TABLET BY MOUTH DAILY 90 tablet 3   famotidine  (PEPCID ) 10 MG tablet Take 10 mg by mouth 2 (two) times daily.     levothyroxine  (SYNTHROID ) 50 MCG tablet Take 1 tablet (50 mcg total) by mouth daily before breakfast. Do not eat or drink until 30 mins- 1 hour after taking the pill. 30 tablet 3   lidocaine -prilocaine  (EMLA ) cream Apply 1 Application topically as needed. 30 g 0   montelukast  (SINGULAIR ) 10 MG tablet Take 1 tablet (10 mg total) by mouth at bedtime. As needed if breaks out into rash (Patient taking differently: Take 10 mg by mouth at bedtime.) 90 tablet 1   ondansetron  (ZOFRAN ) 8 MG tablet Take 1 tablet (8 mg total) by mouth every 8 (eight) hours as needed for up to 30 doses for nausea or vomiting. 30 tablet 0   prochlorperazine  (COMPAZINE ) 10 MG tablet Take 1 tablet (10 mg total) by mouth every 6 (six) hours as needed for nausea or vomiting. 90 tablet 1   topiramate  (TOPAMAX ) 50 MG tablet TAKE 2 TABLETS BY MOUTH AT  BEDTIME 180 tablet 3   traMADol  (ULTRAM ) 50 MG tablet Take 1 tablet (50 mg total) by mouth every 6 (six) hours as needed for moderate pain (pain score 4-6) or severe pain (pain score 7-10) (from bladder cancer). 30 tablet 0   triamcinolone  ointment (KENALOG ) 0.5 % Apply 1 Application topically 2 (two) times daily as needed (on flared up areas of arms and legs). Do not apply on face, armpit and groin. 30 g 0   No current facility-administered medications for this visit.   Facility-Administered Medications Ordered in Other Visits  Medication Dose Route Frequency Provider Last Rate Last Admin   0.9 %  sodium chloride  infusion   Intravenous Continuous Rennie, Orvell Careaga R, MD       heparin  lock flush 100 unit/mL  500 Units Intravenous Once Agrawal, Kavita, MD        heparin  lock flush 100 unit/mL  500 Units Intravenous Once Agrawal, Kavita, MD       heparin  lock flush 100 unit/mL  500 Units Intravenous Once Agrawal, Kavita, MD       pembrolizumab  (KEYTRUDA ) 200 mg in sodium chloride  0.9 % 50 mL chemo infusion  200 mg Intravenous Once Bari Leib R,  MD       prochlorperazine  (COMPAZINE ) tablet 10 mg  10 mg Oral Once Caramia Boutin R, MD       sodium chloride  flush (NS) 0.9 % injection 10 mL  10 mL Intravenous Once Agrawal, Kavita, MD       sodium chloride  flush (NS) 0.9 % injection 10 mL  10 mL Intravenous Once Agrawal, Kavita, MD       sodium chloride  flush (NS) 0.9 % injection 10 mL  10 mL Intravenous Once Agrawal, Kavita, MD        PHYSICAL EXAMINATION:   Vitals:   06/02/24 1004  BP: 115/79  Pulse: 72  Resp: 20  Temp: 98.6 F (37 C)  SpO2: 100%   Filed Weights   06/02/24 1004  Weight: 106 lb (48.1 kg)    Physical Exam Vitals and nursing note reviewed.  HENT:     Head: Normocephalic and atraumatic.     Mouth/Throat:     Pharynx: Oropharynx is clear.  Eyes:     Extraocular Movements: Extraocular movements intact.     Pupils: Pupils are equal, round, and reactive to light.  Cardiovascular:     Rate and Rhythm: Normal rate and regular rhythm.  Pulmonary:     Comments: Decreased breath sounds bilaterally.  Abdominal:     Palpations: Abdomen is soft.  Musculoskeletal:        General: Normal range of motion.     Cervical back: Normal range of motion.  Skin:    General: Skin is warm.  Neurological:     General: No focal deficit present.     Mental Status: She is alert and oriented to person, place, and time.  Psychiatric:        Behavior: Behavior normal.        Judgment: Judgment normal.     LABORATORY DATA:  I have reviewed the data as listed Lab Results  Component Value Date   WBC 5.8 06/02/2024   HGB 12.1 06/02/2024   HCT 35.7 (L) 06/02/2024   MCV 96.2 06/02/2024   PLT 237 06/02/2024   Recent Labs     05/12/24 0910 05/19/24 0929 06/02/24 0952  NA 137 135 137  K 3.4* 3.6 3.5  CL 107 107 107  CO2 24 23 23   GLUCOSE 80 92 82  BUN 18 20 21   CREATININE 1.20* 1.38* 1.33*  CALCIUM  8.5* 8.5* 8.9  GFRNONAA 51* 43* 45*  PROT 7.4 7.1 7.3  ALBUMIN 3.9 3.8 3.7  AST 18 22 19   ALT 9 9 9   ALKPHOS 48 43 50  BILITOT 0.6 0.6 0.5    RADIOGRAPHIC STUDIES: I have personally reviewed the radiological images as listed and agreed with the findings in the report. NM PET Image Restage (PS) Skull Base to Thigh (F-18 FDG) Result Date: 05/08/2024 CLINICAL DATA:  Subsequent treatment strategy for metastatic bladder cancer. EXAM: NUCLEAR MEDICINE PET SKULL BASE TO THIGH TECHNIQUE: 6.05 mCi F-18 FDG was injected intravenously. Full-ring PET imaging was performed from the skull base to thigh after the radiotracer. CT data was obtained and used for attenuation correction and anatomic localization. Fasting blood glucose: 87 mg/dl COMPARISON:  PET-CT 98/93/7974. CT of the chest, abdomen and pelvis 01/06/2024. Abdominopelvic CT 06/09/2023. FINDINGS: Mediastinal blood pool activity: SUV max 1.8 NECK: No hypermetabolic cervical lymph nodes are identified. No suspicious activity identified within the pharyngeal mucosal space. Incidental CT findings: none CHEST: No significant residual hypermetabolic activity within the previously demonstrated right paratracheal node. This cream has  an SUV max of 2.3 (previously 4.2, remeasured). There are additional small mediastinal and axillary lymph nodes with low level metabolic activity, similar to the previous study. Subcarinal node has an SUV max of 3.3 (previously 4.1). Axillary nodal activity is similar to blood pool. These are likely reactive. No hypermetabolic pulmonary activity or suspicious nodularity. Incidental CT findings: Right IJ Port-A-Cath extends to the superior cavoatrial junction. Atherosclerosis of the aorta, great vessels and coronary arteries. Calcifications of the  aortic valve. ABDOMEN/PELVIS: There is no hypermetabolic activity within the liver, adrenal glands, spleen or pancreas. The previously demonstrated hypermetabolic retroperitoneal and pelvic lymph nodes have generally improved, consistent with response to treatment. A left external iliac node measuring 9 mm on image 132/6 has an SUV max of 3.5 (previously 4.4). Previously demonstrated left pelvic sidewall adenopathy has resolved. 9 mm right pelvic sidewall node measuring 9 mm on image 123/6 which has an SUV max of 3.9 (previously 4.3). This node does appear slightly enlarged compared with the most recent CT. In addition, there is new presacral nodule measuring 6 mm on image 126/6 with mild hypermetabolic activity (SUV max 3.0). Similar small inguinal lymph nodes bilaterally, likely reactive. Previously noted hypermetabolic activity along the vaginal cuff has improved. No definite peritoneal disease. Interval improved right abdominal wall activity, likely postsurgical. Incidental CT findings: Double-J left ureteral stent remains in place with residual mild ureteral dilatation and left renal cortical thinning and scarring. Calcified gallstone. Aortic and branch vessel atherosclerosis. SKELETON: There is no hypermetabolic activity to suggest osseous metastatic disease. Incidental CT findings: none IMPRESSION: 1. Compared with previous PET-CT, generally improved hypermetabolic retroperitoneal and pelvic lymph nodes, consistent with response to treatment. 2. There is a single right pelvic sidewall node which has slightly enlarged compared with the most recent CT and demonstrates mild hypermetabolic activity, suspicious for residual metastatic disease. 3. New small presacral nodule with mild hypermetabolic activity, suspicious for metastatic disease. 4. No evidence of metastatic disease in the chest. 5.  Aortic Atherosclerosis (ICD10-I70.0). Electronically Signed   By: Elsie Perone M.D.   On: 05/08/2024 11:52      Malignant neoplasm of urinary bladder [facility]) # Stage/metastatic urothelial cancer; [facility]us testing was sent on peritoneal biopsies - no targets currently on Pembro+ Erfortumab- MARCH 17th, 2025- CT chest abdomen pelvis reviewed from March 2025 showed positive response to the treatment.  Imaging was also discussed in tumor conference.  No evidence of any disease noted.    # Proceed with Pem+Enfor 1mg /kg- day1 today. Labs-CBC/chemistries were reviewed with the patient.  Patient tolerating well except for skin rash see below.   PET July 16th, 2025-. Compared with previous PET-CT, generally improved hypermetabolic retroperitoneal and pelvic lymph nodes, consistent with response to treatment. There is a single right pelvic sidewall node which has slightly enlarged compared with the most recent CT and demonstrates mild hypermetabolic activity, suspicious for residual metastatic disease;  New small presacral nodule with mild hypermetabolic activity, suspicious for metastatic disease;  No evidence of metastatic disease in the chest. Will repeat scan in NOV-DEC 2025.   # Renal/Hypokalemia- -Potassium 3.3.  Declined oral and IV potassium supplements; supp- potassium rich food.  Continue monitoring.  CKD stage III -however GFR today is lower at 35.  Likely secondary to poor hydration.  Increase fluid intake.  # Elevated TSH but normal- T4- discussed with patient- will monitor for now.  Hypothyroidism sec to jerome- [AUG 2025]- start synthroid  50 mcg/day-    # Weight loss: -Continue to follow with nutrition.  Poor taste- wants to HOLD off Zyprexa.   # Maculopapular rash: Likely secondary to enfortumab vedotin - Initially, started with grade 3 involving more than 30% BSA with severe pruritus, mild fever.  Significantly improved after dose reduction. Continue with supportive care-Claritin, Aveeno moisturizer and Singulair ; Kenalog  0.5% twice daily as needed to the affected areas- stable.  # Mild lower abdominal  pain- sec to malignancy- recommend tramadol  as needed. Stable.    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  Continue follow-up with Dr. Alvaro.stable.   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  Order Thyroid  profile in sep PS- # DISPOSITION: # chemo today # as per IS- in 1 week-chemo/labs- # Follow up in 3 weeks- MD: labs- cbc/cmp; chemo- # in 4 weeks- labs- cbc/bmp; chemo- Dr.B  Above plan of care was discussed with patient/family in detail.  My contact information was given to the patient/family.      Cindy JONELLE Joe, MD 06/02/2024 10:55 AM

## 2024-06-09 ENCOUNTER — Inpatient Hospital Stay

## 2024-06-09 VITALS — BP 108/64 | HR 69 | Resp 18 | Wt 105.9 lb

## 2024-06-09 DIAGNOSIS — C679 Malignant neoplasm of bladder, unspecified: Secondary | ICD-10-CM

## 2024-06-09 DIAGNOSIS — Z5112 Encounter for antineoplastic immunotherapy: Secondary | ICD-10-CM | POA: Diagnosis not present

## 2024-06-09 LAB — CBC WITH DIFFERENTIAL (CANCER CENTER ONLY)
Abs Immature Granulocytes: 0.01 K/uL (ref 0.00–0.07)
Basophils Absolute: 0 K/uL (ref 0.0–0.1)
Basophils Relative: 0 %
Eosinophils Absolute: 0.5 K/uL (ref 0.0–0.5)
Eosinophils Relative: 9 %
HCT: 36 % (ref 36.0–46.0)
Hemoglobin: 12.1 g/dL (ref 12.0–15.0)
Immature Granulocytes: 0 %
Lymphocytes Relative: 43 %
Lymphs Abs: 2.3 K/uL (ref 0.7–4.0)
MCH: 32.2 pg (ref 26.0–34.0)
MCHC: 33.6 g/dL (ref 30.0–36.0)
MCV: 95.7 fL (ref 80.0–100.0)
Monocytes Absolute: 0.6 K/uL (ref 0.1–1.0)
Monocytes Relative: 10 %
Neutro Abs: 2 K/uL (ref 1.7–7.7)
Neutrophils Relative %: 38 %
Platelet Count: 190 K/uL (ref 150–400)
RBC: 3.76 MIL/uL — ABNORMAL LOW (ref 3.87–5.11)
RDW: 14.2 % (ref 11.5–15.5)
WBC Count: 5.4 K/uL (ref 4.0–10.5)
nRBC: 0 % (ref 0.0–0.2)

## 2024-06-09 LAB — CMP (CANCER CENTER ONLY)
ALT: 7 U/L (ref 0–44)
AST: 20 U/L (ref 15–41)
Albumin: 3.7 g/dL (ref 3.5–5.0)
Alkaline Phosphatase: 46 U/L (ref 38–126)
Anion gap: 8 (ref 5–15)
BUN: 18 mg/dL (ref 8–23)
CO2: 22 mmol/L (ref 22–32)
Calcium: 8.6 mg/dL — ABNORMAL LOW (ref 8.9–10.3)
Chloride: 106 mmol/L (ref 98–111)
Creatinine: 1.19 mg/dL — ABNORMAL HIGH (ref 0.44–1.00)
GFR, Estimated: 51 mL/min — ABNORMAL LOW (ref >60)
Glucose, Bld: 77 mg/dL (ref 70–99)
Potassium: 3.4 mmol/L — ABNORMAL LOW (ref 3.5–5.1)
Sodium: 136 mmol/L (ref 135–145)
Total Bilirubin: 0.6 mg/dL (ref 0.0–1.2)
Total Protein: 7.4 g/dL (ref 6.5–8.1)

## 2024-06-09 MED ORDER — SODIUM CHLORIDE 0.9 % IV SOLN
1.0000 mg/kg | Freq: Once | INTRAVENOUS | Status: AC
  Administered 2024-06-09: 50 mg via INTRAVENOUS
  Filled 2024-06-09: qty 3

## 2024-06-09 MED ORDER — SODIUM CHLORIDE 0.9 % IV SOLN
INTRAVENOUS | Status: DC
Start: 2024-06-09 — End: 2024-06-09
  Filled 2024-06-09: qty 250

## 2024-06-09 MED ORDER — PROCHLORPERAZINE MALEATE 10 MG PO TABS
10.0000 mg | ORAL_TABLET | Freq: Once | ORAL | Status: AC
  Administered 2024-06-09: 10 mg via ORAL
  Filled 2024-06-09: qty 1

## 2024-06-09 NOTE — Patient Instructions (Signed)
 [facility] - A DEPT OF [facility]  Discharge Instructions: Thank you for choosing [facility] to provide your oncology and hematology care.  If you have a lab appointment with the Cancer Center, please go directly to the Cancer Center and check in at the registration area.  Wear comfortable clothing and clothing appropriate for easy access to any Portacath or PICC line.   We strive to give you quality time with your provider. You may need to reschedule your appointment if you arrive late (15 or more minutes).  Arriving late affects you and other patients whose appointments are after yours.  Also, if you miss three or more appointments without notifying the office, you may be dismissed from the clinic at the provider's discretion.      For prescription refill requests, have your pharmacy contact our office and allow 72 hours for refills to be completed.    Today you received the following chemotherapy and/or immunotherapy agents Padcev      To help prevent nausea and vomiting after your treatment, we encourage you to take your nausea medication as directed.  BELOW ARE SYMPTOMS THAT SHOULD BE REPORTED IMMEDIATELY: *FEVER GREATER THAN 100.4 F (38 C) OR HIGHER *CHILLS OR SWEATING *NAUSEA AND VOMITING THAT IS NOT CONTROLLED WITH YOUR NAUSEA MEDICATION *UNUSUAL SHORTNESS OF BREATH *UNUSUAL BRUISING OR BLEEDING *URINARY PROBLEMS (pain or burning when urinating, or frequent urination) *BOWEL PROBLEMS (unusual diarrhea, constipation, pain near the anus) TENDERNESS IN MOUTH AND THROAT WITH OR WITHOUT PRESENCE OF ULCERS (sore throat, sores in mouth, or a toothache) UNUSUAL RASH, SWELLING OR PAIN  UNUSUAL VAGINAL DISCHARGE OR ITCHING   Items with * indicate a potential emergency and should be followed up as soon as possible or go to the Emergency Department if any problems should occur.  Please show the CHEMOTHERAPY ALERT CARD or IMMUNOTHERAPY ALERT  CARD at check-in to the Emergency Department and triage nurse.  Should you have questions after your visit or need to cancel or reschedule your appointment, please contact [facility] - A DEPT OF [facility]  [phone] and follow the prompts.  Office hours are 8:00 a.m. to 4:30 p.m. Monday - Friday. Please note that voicemails left after 4:00 p.m. may not be returned until the following business day.  We are closed weekends and major holidays. You have access to a nurse at all times for urgent questions. Please call the main number to the clinic [phone] and follow the prompts.  For any non-urgent questions, you may also contact your provider using MyChart. We now offer e-Visits for anyone 78 and older to request care online for non-urgent symptoms. For details visit [email or web address].   Also download the MyChart app! Go to the app store, search "MyChart", open the app, select [facility], and log in with your MyChart username and password.

## 2024-06-12 ENCOUNTER — Other Ambulatory Visit: Payer: Self-pay | Admitting: Internal Medicine

## 2024-06-12 DIAGNOSIS — C679 Malignant neoplasm of bladder, unspecified: Secondary | ICD-10-CM

## 2024-06-19 ENCOUNTER — Encounter: Payer: Self-pay | Admitting: Internal Medicine

## 2024-06-24 ENCOUNTER — Encounter: Payer: Self-pay | Admitting: Internal Medicine

## 2024-06-24 ENCOUNTER — Inpatient Hospital Stay

## 2024-06-24 ENCOUNTER — Inpatient Hospital Stay: Attending: Internal Medicine

## 2024-06-24 ENCOUNTER — Inpatient Hospital Stay: Admitting: Internal Medicine

## 2024-06-24 VITALS — BP 115/71 | HR 73 | Temp 97.5°F | Resp 16 | Ht 65.5 in | Wt 108.0 lb

## 2024-06-24 DIAGNOSIS — C679 Malignant neoplasm of bladder, unspecified: Secondary | ICD-10-CM | POA: Diagnosis not present

## 2024-06-24 DIAGNOSIS — Z79899 Other long term (current) drug therapy: Secondary | ICD-10-CM | POA: Diagnosis not present

## 2024-06-24 DIAGNOSIS — Z79620 Long term (current) use of immunosuppressive biologic: Secondary | ICD-10-CM | POA: Diagnosis not present

## 2024-06-24 DIAGNOSIS — Z5112 Encounter for antineoplastic immunotherapy: Secondary | ICD-10-CM | POA: Diagnosis present

## 2024-06-24 DIAGNOSIS — C786 Secondary malignant neoplasm of retroperitoneum and peritoneum: Secondary | ICD-10-CM | POA: Insufficient documentation

## 2024-06-24 LAB — CBC WITH DIFFERENTIAL (CANCER CENTER ONLY)
Abs Immature Granulocytes: 0.01 K/uL (ref 0.00–0.07)
Basophils Absolute: 0 K/uL (ref 0.0–0.1)
Basophils Relative: 1 %
Eosinophils Absolute: 0.5 K/uL (ref 0.0–0.5)
Eosinophils Relative: 9 %
HCT: 34.1 % — ABNORMAL LOW (ref 36.0–46.0)
Hemoglobin: 11.3 g/dL — ABNORMAL LOW (ref 12.0–15.0)
Immature Granulocytes: 0 %
Lymphocytes Relative: 35 %
Lymphs Abs: 1.9 K/uL (ref 0.7–4.0)
MCH: 32.4 pg (ref 26.0–34.0)
MCHC: 33.1 g/dL (ref 30.0–36.0)
MCV: 97.7 fL (ref 80.0–100.0)
Monocytes Absolute: 0.6 K/uL (ref 0.1–1.0)
Monocytes Relative: 11 %
Neutro Abs: 2.5 K/uL (ref 1.7–7.7)
Neutrophils Relative %: 44 %
Platelet Count: 203 K/uL (ref 150–400)
RBC: 3.49 MIL/uL — ABNORMAL LOW (ref 3.87–5.11)
RDW: 14.4 % (ref 11.5–15.5)
WBC Count: 5.6 K/uL (ref 4.0–10.5)
nRBC: 0 % (ref 0.0–0.2)

## 2024-06-24 LAB — CMP (CANCER CENTER ONLY)
ALT: 7 U/L (ref 0–44)
AST: 19 U/L (ref 15–41)
Albumin: 3.7 g/dL (ref 3.5–5.0)
Alkaline Phosphatase: 47 U/L (ref 38–126)
Anion gap: 8 (ref 5–15)
BUN: 20 mg/dL (ref 8–23)
CO2: 23 mmol/L (ref 22–32)
Calcium: 8.6 mg/dL — ABNORMAL LOW (ref 8.9–10.3)
Chloride: 104 mmol/L (ref 98–111)
Creatinine: 1.31 mg/dL — ABNORMAL HIGH (ref 0.44–1.00)
GFR, Estimated: 46 mL/min — ABNORMAL LOW (ref >60)
Glucose, Bld: 86 mg/dL (ref 70–99)
Potassium: 3.3 mmol/L — ABNORMAL LOW (ref 3.5–5.1)
Sodium: 135 mmol/L (ref 135–145)
Total Bilirubin: 0.7 mg/dL (ref 0.0–1.2)
Total Protein: 7.2 g/dL (ref 6.5–8.1)

## 2024-06-24 MED ORDER — SODIUM CHLORIDE 0.9 % IV SOLN
200.0000 mg | Freq: Once | INTRAVENOUS | Status: AC
  Administered 2024-06-24: 200 mg via INTRAVENOUS
  Filled 2024-06-24: qty 8

## 2024-06-24 MED ORDER — SODIUM CHLORIDE 0.9 % IV SOLN
INTRAVENOUS | Status: DC
  Filled 2024-06-24: qty 250

## 2024-06-24 MED ORDER — SODIUM CHLORIDE 0.9 % IV SOLN
1.0000 mg/kg | Freq: Once | INTRAVENOUS | Status: AC
  Administered 2024-06-24: 50 mg via INTRAVENOUS
  Filled 2024-06-24: qty 3

## 2024-06-24 MED ORDER — PROCHLORPERAZINE MALEATE 10 MG PO TABS
10.0000 mg | ORAL_TABLET | Freq: Once | ORAL | Status: AC
  Administered 2024-06-24: 10 mg via ORAL
  Filled 2024-06-24: qty 1

## 2024-06-24 NOTE — Patient Instructions (Addendum)
 [facility] - A DEPT OF [facility]  Discharge Instructions: Thank you for choosing [facility] to provide your oncology and hematology care.  If you have a lab appointment with the Cancer Center, please go directly to the Cancer Center and check in at the registration area.  Wear comfortable clothing and clothing appropriate for easy access to any Portacath or PICC line.   We strive to give you quality time with your provider. You may need to reschedule your appointment if you arrive late (15 or more minutes).  Arriving late affects you and other patients whose appointments are after yours.  Also, if you miss three or more appointments without notifying the office, you may be dismissed from the clinic at the provider's discretion.      For prescription refill requests, have your pharmacy contact our office and allow 72 hours for refills to be completed.    Today you received the following chemotherapy and/or immunotherapy agents Padcev   and keytruda     To help prevent nausea and vomiting after your treatment, we encourage you to take your nausea medication as directed.  BELOW ARE SYMPTOMS THAT SHOULD BE REPORTED IMMEDIATELY: *FEVER GREATER THAN 100.4 F (38 C) OR HIGHER *CHILLS OR SWEATING *NAUSEA AND VOMITING THAT IS NOT CONTROLLED WITH YOUR NAUSEA MEDICATION *UNUSUAL SHORTNESS OF BREATH *UNUSUAL BRUISING OR BLEEDING *URINARY PROBLEMS (pain or burning when urinating, or frequent urination) *BOWEL PROBLEMS (unusual diarrhea, constipation, pain near the anus) TENDERNESS IN MOUTH AND THROAT WITH OR WITHOUT PRESENCE OF ULCERS (sore throat, sores in mouth, or a toothache) UNUSUAL RASH, SWELLING OR PAIN  UNUSUAL VAGINAL DISCHARGE OR ITCHING   Items with * indicate a potential emergency and should be followed up as soon as possible or go to the Emergency Department if any problems should occur.  Please show the CHEMOTHERAPY ALERT CARD or  IMMUNOTHERAPY ALERT CARD at check-in to the Emergency Department and triage nurse.  Should you have questions after your visit or need to cancel or reschedule your appointment, please contact [facility] - A DEPT OF [facility]  [phone] and follow the prompts.  Office hours are 8:00 a.m. to 4:30 p.m. Monday - Friday. Please note that voicemails left after 4:00 p.m. may not be returned until the following business day.  We are closed weekends and major holidays. You have access to a nurse at all times for urgent questions. Please call the main number to the clinic [phone] and follow the prompts.  For any non-urgent questions, you may also contact your provider using MyChart. We now offer e-Visits for anyone 23 and older to request care online for non-urgent symptoms. For details visit [email or web address].   Also download the MyChart app! Go to the app store, search MyChart, open the app, select [place], and log in with your MyChart username and password.

## 2024-06-24 NOTE — Progress Notes (Signed)
 Patient here for follow up; patient denies any concerns at this time.

## 2024-06-24 NOTE — Progress Notes (Signed)
 [facility] CONSULT NOTE  Patient Care Team: McDonough, Tinnie [name] as PCP - General (Physician Assistant) Rennie Cindy SAUNDERS, MD as Consulting Physician (Oncology)  CHIEF COMPLAINTS/PURPOSE OF CONSULTATION: Bladder cancer  Oncology History Overview Note  # Metastatic urothelial carcinoma - Patient developed urinary symptoms around September or October 2023.  Was treated with multiple rounds of antibiotics with no improvement.  She was then referred to Dr. Penne for further evaluation.   - s/p TURBT with left ureteral stent placement by Dr. Penne.  IntraOp findings showed large irregular mucosal submucosal extensive tumor from the left dome, left lateral bladder wall majority of trigone including the left UO and bladder neck.  Measures at least 5 x 5 cm. Pathology showed invasive high-grade urothelial carcinoma. Invades muscularis propria.   - Completed neoadjuvant cycle 4-day 1 of split dose cisplatin  and gemcitabine  (07/23/2023). Pt declined C4D8 due to poor tolerance/nausea/flank pain.  Split dose cisplatin  considered for CKD stage III.   - s/p robotic adhesiolysis + peritoneal biopsies 09/25/23 showed urothelial cancer with peritoneal carcinomatosis and small + large bowel serosal involvement. Original plan was cystectomy and urinary diversion but this was aborted based on intra-op findings.   # JAN 14th, 2025- Start Pem+Erfo   Urothelial cancer (HCC)  04/12/2023 Initial Diagnosis   Urothelial cancer (HCC)   04/12/2023 Cancer Staging   Staging form: Kidney, AJCC 8th Edition - Clinical: Stage III (cT3, cN0, cM0) - Signed by Agrawal, Kavita, MD on 05/07/2023 Stage prefix: Initial diagnosis   09/25/2023 Cancer Staging   Staging form: Kidney, AJCC 8th Edition - Pathologic stage from 09/25/2023: Stage IV (pM1) - Signed by Clista Bimler, MD on 10/25/2023   Malignant neoplasm of urinary bladder (HCC)  04/22/2023 Initial Diagnosis   Malignant neoplasm of urinary bladder  (HCC)   05/07/2023 - 07/23/2023 Chemotherapy   Patient is on Treatment Plan : BLADDER Gemcitabine  D1,8 + Cisplatin  (split dose) D1,8 q21d x 4 cycles     11/05/2023 -  Chemotherapy   Patient is on Treatment Plan : UROTHELIAL ADVANCED, METASTATIC ENFORTUMAB D1, D8 + PEMBROLIZUMAB  (200) D1 Q21D       HISTORY OF PRESENTING ILLNESS: Patient ambulating-independently.   Alone.   Barbara [name] 63 y.o.  female pleasant patient with stage IV/metastatic urothelial cancer-currently on pembrolizumab  plus Enfortumab is here for follow-up.  Patient continues eye moisturizing/drops advised. Complaints of poor taste.  There is no weight loss.  Continues to have mild nausea, using the compazine .   C/o rash on overall body, itchy-overall stable on current regimen. Complains of fatigue. Mild lower abdominal pain.   Review of Systems  Constitutional:  Positive for malaise/fatigue. Negative for chills, diaphoresis, fever and weight loss.  HENT:  Negative for nosebleeds and sore throat.   Eyes:  Negative for double vision.  Respiratory:  Negative for cough, hemoptysis, sputum production, shortness of breath and wheezing.   Cardiovascular:  Negative for chest pain, palpitations, orthopnea and leg swelling.  Gastrointestinal:  Negative for abdominal pain, blood in stool, constipation, diarrhea, heartburn, melena, nausea and vomiting.  Genitourinary:  Negative for dysuria, frequency and urgency.  Musculoskeletal:  Negative for back pain and joint pain.  Skin:  Positive for itching and rash.  Neurological:  Negative for dizziness, tingling, focal weakness, weakness and headaches.  Endo/Heme/Allergies:  Does not bruise/bleed easily.  Psychiatric/Behavioral:  Negative for depression. The patient is not nervous/anxious and does not have insomnia.     MEDICAL HISTORY:  Past Medical History:  Diagnosis Date  Adopted    Allergic rhinitis    Anemia    h/o   Anxiety    Arthritis    B12 deficiency     Bilateral carotid artery stenosis    Bilateral carotid bruits    Bladder tumor    Cancer (HCC)    bladder cancer   Depression    Gallbladder polyp    GERD (gastroesophageal reflux disease)    Heart murmur    Hydronephrosis of left kidney    Migraines    Tobacco abuse    UTI (urinary tract infection)     SURGICAL HISTORY: Past Surgical History:  Procedure Laterality Date   ABDOMINAL HYSTERECTOMY     BLADDER SURGERY     BREAST BIOPSY Left 2014   neg   CYSTOSCOPY W/ RETROGRADES Bilateral 01/29/2024   Procedure: CYSTOSCOPY, WITH RETROGRADE PYELOGRAM;  Surgeon: Alvaro Ricardo KATHEE Mickey., MD;  Location: [facility];  Service: Urology;  Laterality: Bilateral;   CYSTOSCOPY W/ URETERAL STENT PLACEMENT Left 04/04/2023   Procedure: CYSTOSCOPY WITH RETROGRADE PYELOGRAM/URETERAL STENT PLACEMENT;  Surgeon: Penne Knee, MD;  Location: [facility];  Service: Urology;  Laterality: Left;   CYSTOSCOPY WITH STENT PLACEMENT Left 01/29/2024   Procedure: CYSTOSCOPY, WITH STENT INSERTION;  Surgeon: Alvaro Ricardo KATHEE Mickey., MD;  Location: [facility];  Service: Urology;  Laterality: Left;   FOOT SURGERY Right    2009   IR IMAGING GUIDED PORT INSERTION  05/03/2023   LAPAROSCOPIC APPENDECTOMY  12/05/2012   NASAL SEPTUM SURGERY     ROBOT ASSISTED LAPAROSCOPIC COMPLETE CYSTECT ILEAL CONDUIT N/A 09/25/2023   Procedure: XI ROBOTIC ASSISTED LAPAROSCOPIC EXTENSIVE LYSIS OF ADHESIONS, PERITONEAL BIOPSIES;  Surgeon: Alvaro Ricardo KATHEE Mickey., MD;  Location: [facility];  Service: Urology;  Laterality: N/A;   SISTRUNK PROCEDURE  03/03/2012   Thyroglossal duct cyst excision   TONSILLECTOMY     TRANSURETHRAL RESECTION OF BLADDER TUMOR N/A 04/04/2023   Procedure: TRANSURETHRAL RESECTION OF BLADDER TUMOR (TURBT);  Surgeon: Penne Knee, MD;  Location: [facility];  Service: Urology;  Laterality: N/A;    SOCIAL HISTORY: Social History   Socioeconomic History   Marital status: Married    Spouse name: Not on file   Number of children:  Not on file   Years of education: Not on file   Highest education level: Not on file  Occupational History   Not on file  Tobacco Use   Smoking status: Former    Current packs/day: 0.25    Average packs/day: 0.3 packs/day for 30.0 years (7.5 ttl pk-yrs)    Types: Cigarettes    Passive exposure: Past   Smokeless tobacco: Never  Vaping Use   Vaping status: Never Used  Substance and Sexual Activity   Alcohol  use: Never   Drug use: Never   Sexual activity: Not on file  Other Topics Concern   Not on file  Social History Narrative   Not on file   Social Drivers of Health   Financial Resource Strain: Not on file  Food Insecurity: No Food Insecurity (09/25/2023)   Hunger Vital Sign    Worried About Running Out of Food in the Last Year: Never true    Ran Out of Food in the Last Year: Never true  Transportation Needs: No Transportation Needs (09/25/2023)   PRAPARE - Administrator, Civil Service (Medical): No    Lack of Transportation (Non-Medical): No  Physical Activity: Not on file  Stress: Not on file  Social Connections: Not on file  Intimate  Partner Violence: Not At Risk (09/25/2023)   Humiliation, Afraid, Rape, and Kick questionnaire    Fear of Current or Ex-Partner: No    Emotionally Abused: No    Physically Abused: No    Sexually Abused: No    FAMILY HISTORY: Family History  Adopted: Yes    ALLERGIES:  is allergic to pseudoephedrine hcl.  MEDICATIONS:  Current Outpatient Medications  Medication Sig Dispense Refill   acetaminophen  (TYLENOL ) 500 MG tablet Take 1,000 mg by mouth every 6 (six) hours as needed for moderate pain (pain score 4-6).     carboxymethylcellulose (REFRESH PLUS) 0.5 % SOLN Place 1 drop into both eyes 3 (three) times daily as needed (dry eyes).     carisoprodol  (SOMA ) 350 MG tablet Take 1 tablet (350 mg total) by mouth at bedtime as needed for muscle spasms. 90 tablet 0   dicyclomine  (BENTYL ) 10 MG capsule Take 1 capsule (10 mg total)  by mouth 3 (three) times daily as needed for up to 30 doses for spasms. 30 capsule 0   escitalopram  (LEXAPRO ) 5 MG tablet Take 1 tablet (5 mg total) by mouth daily. 90 tablet 1   estradiol  (ESTRACE ) 1 MG tablet TAKE 1 TABLET BY MOUTH DAILY 90 tablet 3   famotidine  (PEPCID ) 10 MG tablet Take 10 mg by mouth 2 (two) times daily.     levothyroxine  (SYNTHROID ) 50 MCG tablet Take 1 tablet (50 mcg total) by mouth daily before breakfast. Do not eat or drink until 30 mins- 1 hour after taking the pill. 30 tablet 3   lidocaine -prilocaine  (EMLA ) cream Apply 1 Application topically as needed. 30 g 0   montelukast  (SINGULAIR ) 10 MG tablet Take 1 tablet (10 mg total) by mouth at bedtime. As needed if breaks out into rash (Patient taking differently: Take 10 mg by mouth at bedtime.) 90 tablet 1   ondansetron  (ZOFRAN ) 8 MG tablet Take 1 tablet (8 mg total) by mouth every 8 (eight) hours as needed for up to 30 doses for nausea or vomiting. 30 tablet 0   prochlorperazine  (COMPAZINE ) 10 MG tablet TAKE 1 TABLET BY MOUTH EVERY 6 HOURS AS NEEDED FOR NAUSEA OR VOMITING. 90 tablet 1   topiramate  (TOPAMAX ) 50 MG tablet TAKE 2 TABLETS BY MOUTH AT  BEDTIME 180 tablet 3   traMADol  (ULTRAM ) 50 MG tablet Take 1 tablet (50 mg total) by mouth every 6 (six) hours as needed for moderate pain (pain score 4-6) or severe pain (pain score 7-10) (from bladder cancer). 30 tablet 0   triamcinolone  ointment (KENALOG ) 0.5 % Apply 1 Application topically 2 (two) times daily as needed (on flared up areas of arms and legs). Do not apply on face, armpit and groin. 30 g 0   No current facility-administered medications for this visit.   Facility-Administered Medications Ordered in Other Visits  Medication Dose Route Frequency Provider Last Rate Last Admin   0.9 %  sodium chloride  infusion   Intravenous Continuous Rasheida Broden R, MD 10 mL/hr at 06/24/24 0934 New Bag at 06/24/24 0934   enfortumab vedotin -ejfv (PADCEV ) 50 mg in sodium  chloride 0.9 % 50 mL (0.9091 mg/mL) chemo infusion  1 mg/kg (Treatment Plan Recorded) Intravenous Once Shakemia Madera R, MD       heparin  lock flush 100 unit/mL  500 Units Intravenous Once Agrawal, Kavita, MD       heparin  lock flush 100 unit/mL  500 Units Intravenous Once Agrawal, Kavita, MD       heparin  lock flush 100  unit/mL  500 Units Intravenous Once Agrawal, Kavita, MD       pembrolizumab  (KEYTRUDA ) 200 mg in sodium chloride  0.9 % 50 mL chemo infusion  200 mg Intravenous Once Lear Carstens R, MD       sodium chloride  flush (NS) 0.9 % injection 10 mL  10 mL Intravenous Once Agrawal, Kavita, MD       sodium chloride  flush (NS) 0.9 % injection 10 mL  10 mL Intravenous Once Agrawal, Kavita, MD       sodium chloride  flush (NS) 0.9 % injection 10 mL  10 mL Intravenous Once Agrawal, Kavita, MD        PHYSICAL EXAMINATION:   Vitals:   06/24/24 0839  BP: 115/71  Pulse: 73  Resp: 16  Temp: (!) 97.5 F (36.4 C)  SpO2: 99%   Filed Weights   06/24/24 0839  Weight: 108 lb (49 kg)    Physical Exam Vitals and nursing note reviewed.  HENT:     Head: Normocephalic and atraumatic.     Mouth/Throat:     Pharynx: Oropharynx is clear.  Eyes:     Extraocular Movements: Extraocular movements intact.     Pupils: Pupils are equal, round, and reactive to light.  Cardiovascular:     Rate and Rhythm: Normal rate and regular rhythm.  Pulmonary:     Comments: Decreased breath sounds bilaterally.  Abdominal:     Palpations: Abdomen is soft.  Musculoskeletal:        General: Normal range of motion.     Cervical back: Normal range of motion.  Skin:    General: Skin is warm.  Neurological:     General: No focal deficit present.     Mental Status: She is alert and oriented to person, place, and time.  Psychiatric:        Behavior: Behavior normal.        Judgment: Judgment normal.     LABORATORY DATA:  I have reviewed the data as listed Lab Results  Component Value Date    WBC 5.6 06/24/2024   HGB 11.3 (L) 06/24/2024   HCT 34.1 (L) 06/24/2024   MCV 97.7 06/24/2024   PLT 203 06/24/2024   Recent Labs    06/02/24 0952 06/09/24 1301 06/24/24 0829  NA 137 136 135  K 3.5 3.4* 3.3*  CL 107 106 104  CO2 23 22 23   GLUCOSE 82 77 86  BUN 21 18 20   CREATININE 1.33* 1.19* 1.31*  CALCIUM  8.9 8.6* 8.6*  GFRNONAA 45* 51* 46*  PROT 7.3 7.4 7.2  ALBUMIN 3.7 3.7 3.7  AST 19 20 19   ALT 9 7 7   ALKPHOS 50 46 47  BILITOT 0.5 0.6 0.7    RADIOGRAPHIC STUDIES: I have personally reviewed the radiological images as listed and agreed with the findings in the report. No results found.    Malignant neoplasm of urinary bladder [facility]) # Stage/metastatic urothelial cancer; Tempus testing was sent on peritoneal biopsies - no targets currently on Pembro+ Erfortumab-   # PET July 16th, 2025-. Compared with previous PET-CT, generally improved hypermetabolic retroperitoneal and pelvic lymph nodes, consistent with response to treatment. There is a single right pelvic sidewall node which has slightly enlarged compared with the most recent CT and demonstrates mild hypermetabolic activity, suspicious for residual metastatic disease;  New small presacral nodule with mild hypermetabolic activity, suspicious for metastatic disease;  No evidence of metastatic disease in the chest. Will repeat scan in NOV- 2025.   #  Currently on proceed with Pem+Enfor 1mg /kg- day1 today. Labs-CBC/chemistries were reviewed with the patient.  Patient tolerating well except for skin rash see below.     # Renal/Hypokalemia- -Potassium 3.4.  Declined oral and IV potassium supplements; supp- potassium rich food.  Continue monitoring.  CKD stage III -however GFR today is lower at 51  Likely secondary to poor hydration.  Increase fluid intake- stable.  # Elevated TSH but normal- T4- discussed with patient- will monitor for now.  Hypothyroidism sec to fransico SEED 2025]-RECOMMEND  start synthroid  50 mcg/day-   stable.    # Weight loss: -Continue to follow with nutrition.  Poor taste- wants to HOLD off Zyprexa- stable.    # Maculopapular rash: Likely secondary to enfortumab vedotin - Initially, started with grade 3 involving more than 30% BSA with severe pruritus, mild fever.  Significantly improved after dose reduction. Continue with supportive care-Claritin, Aveeno moisturizer and Singulair ; Kenalog  0.5% twice daily as needed to the affected areas- stable.  # Mild lower abdominal pain- sec to malignancy- recommend tramadol  as needed. Stable.    # Hematuria: chronic likely due to bladder mass Moderate left hydroureteronephrosis s/p left ureteral stent- Left ureteral stent last replaced on 01/30/24- awaiting in oct 2025.  Continue follow-up with Dr. Alvaro.stable.   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  Order Thyroid  profile in OCT  PS- # DISPOSITION: # chemo today- WAIT FOR LABS # as per IS- in 1 week-chemo/labs- # Follow up in 3 weeks- MD: labs- cbc/cmp; chemo- # in 4 weeks- labs- cbc/bmp; chemo- Dr.B  Above plan of care was discussed with patient/family in detail.  My contact information was given to the patient/family.      Cindy JONELLE Joe, MD 06/24/2024 9:36 AM

## 2024-06-24 NOTE — Assessment & Plan Note (Addendum)
#   Stage/metastatic urothelial cancer; Tempus testing was sent on peritoneal biopsies - no targets currently on Pembro+ Erfortumab-   # PET July 16th, 2025-. Compared with previous PET-CT, generally improved hypermetabolic retroperitoneal and pelvic lymph nodes, consistent with response to treatment. There is a single right pelvic sidewall node which has slightly enlarged compared with the most recent CT and demonstrates mild hypermetabolic activity, suspicious for residual metastatic disease;  New small presacral nodule with mild hypermetabolic activity, suspicious for metastatic disease;  No evidence of metastatic disease in the chest. Will repeat scan in NOV- 2025.   #  Currently on proceed with Pem+Enfor 1mg /kg- day1 today. Labs-CBC/chemistries were reviewed with the patient.  Patient tolerating well except for skin rash see below.     # Renal/Hypokalemia- -Potassium 3.4.  Declined oral and IV potassium supplements; supp- potassium rich food.  Continue monitoring.  CKD stage III -however GFR today is lower at 51  Likely secondary to poor hydration.  Increase fluid intake- stable.  # Elevated TSH but normal- T4- discussed with patient- will monitor for now.  Hypothyroidism sec to fransico SEED 2025]-RECOMMEND  start synthroid  50 mcg/day-  stable.    # Weight loss: -Continue to follow with nutrition.  Poor taste- wants to HOLD off Zyprexa- stable.    # Maculopapular rash: Likely secondary to enfortumab vedotin - Initially, started with grade 3 involving more than 30% BSA with severe pruritus, mild fever.  Significantly improved after dose reduction. Continue with supportive care-Claritin, Aveeno moisturizer and Singulair ; Kenalog  0.5% twice daily as needed to the affected areas- stable.  # Mild lower abdominal pain- sec to malignancy- recommend tramadol  as needed. Stable.    # Hematuria: chronic likely due to bladder mass Moderate left hydroureteronephrosis s/p left ureteral stent- Left ureteral  stent last replaced on 01/30/24- awaiting in oct 2025.  Continue follow-up with Dr. Alvaro.stable.   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  Order Thyroid  profile in OCT  PS- # DISPOSITION: # chemo today- WAIT FOR LABS # as per IS- in 1 week-chemo/labs- # Follow up in 3 weeks- MD: labs- cbc/cmp; chemo- # in 4 weeks- labs- cbc/bmp; chemo- Dr.B

## 2024-06-30 ENCOUNTER — Inpatient Hospital Stay

## 2024-06-30 VITALS — BP 112/65 | HR 58 | Temp 96.7°F | Resp 18 | Wt 106.0 lb

## 2024-06-30 DIAGNOSIS — Z5112 Encounter for antineoplastic immunotherapy: Secondary | ICD-10-CM | POA: Diagnosis not present

## 2024-06-30 DIAGNOSIS — C679 Malignant neoplasm of bladder, unspecified: Secondary | ICD-10-CM

## 2024-06-30 LAB — CMP (CANCER CENTER ONLY)
ALT: 9 U/L (ref 0–44)
AST: 20 U/L (ref 15–41)
Albumin: 3.6 g/dL (ref 3.5–5.0)
Alkaline Phosphatase: 51 U/L (ref 38–126)
Anion gap: 9 (ref 5–15)
BUN: 19 mg/dL (ref 8–23)
CO2: 23 mmol/L (ref 22–32)
Calcium: 8.9 mg/dL (ref 8.9–10.3)
Chloride: 104 mmol/L (ref 98–111)
Creatinine: 1.24 mg/dL — ABNORMAL HIGH (ref 0.44–1.00)
GFR, Estimated: 49 mL/min — ABNORMAL LOW (ref >60)
Glucose, Bld: 87 mg/dL (ref 70–99)
Potassium: 3.4 mmol/L — ABNORMAL LOW (ref 3.5–5.1)
Sodium: 136 mmol/L (ref 135–145)
Total Bilirubin: 0.6 mg/dL (ref 0.0–1.2)
Total Protein: 7.4 g/dL (ref 6.5–8.1)

## 2024-06-30 LAB — CBC WITH DIFFERENTIAL (CANCER CENTER ONLY)
Abs Immature Granulocytes: 0.01 K/uL (ref 0.00–0.07)
Basophils Absolute: 0 K/uL (ref 0.0–0.1)
Basophils Relative: 1 %
Eosinophils Absolute: 0.5 K/uL (ref 0.0–0.5)
Eosinophils Relative: 8 %
HCT: 35 % — ABNORMAL LOW (ref 36.0–46.0)
Hemoglobin: 11.9 g/dL — ABNORMAL LOW (ref 12.0–15.0)
Immature Granulocytes: 0 %
Lymphocytes Relative: 42 %
Lymphs Abs: 2.6 K/uL (ref 0.7–4.0)
MCH: 32.5 pg (ref 26.0–34.0)
MCHC: 34 g/dL (ref 30.0–36.0)
MCV: 95.6 fL (ref 80.0–100.0)
Monocytes Absolute: 0.6 K/uL (ref 0.1–1.0)
Monocytes Relative: 10 %
Neutro Abs: 2.4 K/uL (ref 1.7–7.7)
Neutrophils Relative %: 39 %
Platelet Count: 224 K/uL (ref 150–400)
RBC: 3.66 MIL/uL — ABNORMAL LOW (ref 3.87–5.11)
RDW: 14 % (ref 11.5–15.5)
WBC Count: 6.2 K/uL (ref 4.0–10.5)
nRBC: 0 % (ref 0.0–0.2)

## 2024-06-30 MED ORDER — PROCHLORPERAZINE MALEATE 10 MG PO TABS
10.0000 mg | ORAL_TABLET | Freq: Once | ORAL | Status: AC
  Administered 2024-06-30: 10 mg via ORAL
  Filled 2024-06-30: qty 1

## 2024-06-30 MED ORDER — SODIUM CHLORIDE 0.9 % IV SOLN
1.0000 mg/kg | Freq: Once | INTRAVENOUS | Status: AC
  Administered 2024-06-30: 50 mg via INTRAVENOUS
  Filled 2024-06-30: qty 3

## 2024-06-30 MED ORDER — SODIUM CHLORIDE 0.9 % IV SOLN
INTRAVENOUS | Status: DC
  Filled 2024-06-30: qty 250

## 2024-06-30 NOTE — Patient Instructions (Signed)

## 2024-07-15 ENCOUNTER — Encounter: Payer: Self-pay | Admitting: Internal Medicine

## 2024-07-15 ENCOUNTER — Inpatient Hospital Stay

## 2024-07-15 ENCOUNTER — Inpatient Hospital Stay: Admitting: Internal Medicine

## 2024-07-15 ENCOUNTER — Other Ambulatory Visit: Payer: Self-pay

## 2024-07-15 VITALS — BP 106/61 | HR 59

## 2024-07-15 VITALS — BP 113/69 | HR 64 | Temp 95.5°F | Resp 16 | Ht 65.5 in | Wt 105.6 lb

## 2024-07-15 DIAGNOSIS — C679 Malignant neoplasm of bladder, unspecified: Secondary | ICD-10-CM

## 2024-07-15 DIAGNOSIS — Z5112 Encounter for antineoplastic immunotherapy: Secondary | ICD-10-CM | POA: Diagnosis not present

## 2024-07-15 LAB — CMP (CANCER CENTER ONLY)
ALT: 9 U/L (ref 0–44)
AST: 19 U/L (ref 15–41)
Albumin: 3.7 g/dL (ref 3.5–5.0)
Alkaline Phosphatase: 49 U/L (ref 38–126)
Anion gap: 7 (ref 5–15)
BUN: 20 mg/dL (ref 8–23)
CO2: 23 mmol/L (ref 22–32)
Calcium: 8.7 mg/dL — ABNORMAL LOW (ref 8.9–10.3)
Chloride: 105 mmol/L (ref 98–111)
Creatinine: 1.33 mg/dL — ABNORMAL HIGH (ref 0.44–1.00)
GFR, Estimated: 45 mL/min — ABNORMAL LOW (ref >60)
Glucose, Bld: 84 mg/dL (ref 70–99)
Potassium: 3.4 mmol/L — ABNORMAL LOW (ref 3.5–5.1)
Sodium: 135 mmol/L (ref 135–145)
Total Bilirubin: 0.2 mg/dL (ref 0.0–1.2)
Total Protein: 7.5 g/dL (ref 6.5–8.1)

## 2024-07-15 LAB — TSH: TSH: 8.569 u[IU]/mL — ABNORMAL HIGH (ref 0.350–4.500)

## 2024-07-15 LAB — CBC WITH DIFFERENTIAL (CANCER CENTER ONLY)
Abs Immature Granulocytes: 0.01 K/uL (ref 0.00–0.07)
Basophils Absolute: 0 K/uL (ref 0.0–0.1)
Basophils Relative: 1 %
Eosinophils Absolute: 0.7 K/uL — ABNORMAL HIGH (ref 0.0–0.5)
Eosinophils Relative: 11 %
HCT: 33.6 % — ABNORMAL LOW (ref 36.0–46.0)
Hemoglobin: 11.5 g/dL — ABNORMAL LOW (ref 12.0–15.0)
Immature Granulocytes: 0 %
Lymphocytes Relative: 40 %
Lymphs Abs: 2.4 K/uL (ref 0.7–4.0)
MCH: 32.9 pg (ref 26.0–34.0)
MCHC: 34.2 g/dL (ref 30.0–36.0)
MCV: 96 fL (ref 80.0–100.0)
Monocytes Absolute: 0.6 K/uL (ref 0.1–1.0)
Monocytes Relative: 9 %
Neutro Abs: 2.3 K/uL (ref 1.7–7.7)
Neutrophils Relative %: 39 %
Platelet Count: 199 K/uL (ref 150–400)
RBC: 3.5 MIL/uL — ABNORMAL LOW (ref 3.87–5.11)
RDW: 14.2 % (ref 11.5–15.5)
WBC Count: 6 K/uL (ref 4.0–10.5)
nRBC: 0 % (ref 0.0–0.2)

## 2024-07-15 LAB — IRON AND TIBC
Iron: 49 ug/dL (ref 28–170)
Saturation Ratios: 22 % (ref 10.4–31.8)
TIBC: 228 ug/dL — ABNORMAL LOW (ref 250–450)
UIBC: 179 ug/dL

## 2024-07-15 LAB — FERRITIN: Ferritin: 83 ng/mL (ref 11–307)

## 2024-07-15 MED ORDER — SODIUM CHLORIDE 0.9 % IV SOLN
1.0000 mg/kg | Freq: Once | INTRAVENOUS | Status: AC
  Administered 2024-07-15: 50 mg via INTRAVENOUS
  Filled 2024-07-15: qty 2

## 2024-07-15 MED ORDER — PROCHLORPERAZINE MALEATE 10 MG PO TABS
10.0000 mg | ORAL_TABLET | Freq: Once | ORAL | Status: AC
  Administered 2024-07-15: 10 mg via ORAL
  Filled 2024-07-15: qty 1

## 2024-07-15 MED ORDER — SODIUM CHLORIDE 0.9 % IV SOLN
INTRAVENOUS | Status: DC
  Filled 2024-07-15: qty 250

## 2024-07-15 MED ORDER — SODIUM CHLORIDE 0.9 % IV SOLN
200.0000 mg | Freq: Once | INTRAVENOUS | Status: AC
  Administered 2024-07-15: 200 mg via INTRAVENOUS
  Filled 2024-07-15: qty 8

## 2024-07-15 NOTE — Progress Notes (Signed)
 Bleeding some with urination. She is thinking stents needs to be replaced, seeing Dr. Alvaro in Oct.

## 2024-07-15 NOTE — Progress Notes (Signed)
 [facility] CONSULT NOTE  Patient Care Team: McDonough, Tinnie Barbara [name] as PCP - General (Physician Assistant) Rennie Cindy SAUNDERS, MD as Consulting Physician (Oncology)  CHIEF COMPLAINTS/PURPOSE OF CONSULTATION: Bladder cancer  Oncology History Overview Note  # Metastatic urothelial carcinoma - Patient developed urinary symptoms around September or October 2023.  Was treated with multiple rounds of antibiotics with no improvement.  She was then referred to Dr. Penne for further evaluation.   - s/p TURBT with left ureteral stent placement by Dr. Penne.  IntraOp findings showed large irregular mucosal submucosal extensive tumor from the left dome, left lateral bladder wall majority of trigone including the left UO and bladder neck.  Measures at least 5 x 5 cm. Pathology showed invasive high-grade urothelial carcinoma. Invades muscularis propria.   - Completed neoadjuvant cycle 4-day 1 of split dose cisplatin  and gemcitabine  (07/23/2023). Pt declined C4D8 due to poor tolerance/nausea/flank pain.  Split dose cisplatin  considered for CKD stage III.   - s/p robotic adhesiolysis + peritoneal biopsies 09/25/23 showed urothelial cancer with peritoneal carcinomatosis and small + large bowel serosal involvement. Original plan was cystectomy and urinary diversion but this was aborted based on intra-op findings.   # JAN 14th, 2025- Start Pem+Erfo   Urothelial cancer (HCC)  04/12/2023 Initial Diagnosis   Urothelial cancer (HCC)   04/12/2023 Cancer Staging   Staging form: Kidney, AJCC 8th Edition - Clinical: Stage III (cT3, cN0, cM0) - Signed by Agrawal, Kavita, MD on 05/07/2023 Stage prefix: Initial diagnosis   09/25/2023 Cancer Staging   Staging form: Kidney, AJCC 8th Edition - Pathologic stage from 09/25/2023: Stage IV (pM1) - Signed by Clista Bimler, MD on 10/25/2023   Malignant neoplasm of urinary bladder (HCC)  04/22/2023 Initial Diagnosis   Malignant neoplasm of urinary bladder  (HCC)   05/07/2023 - 07/23/2023 Chemotherapy   Patient is on Treatment Plan : BLADDER Gemcitabine  D1,8 + Cisplatin  (split dose) D1,8 q21d x 4 cycles     11/05/2023 -  Chemotherapy   Patient is on Treatment Plan : UROTHELIAL ADVANCED, METASTATIC ENFORTUMAB D1, D8 + PEMBROLIZUMAB  (200) D1 Q21D       HISTORY OF PRESENTING ILLNESS: Patient ambulating-independently.   Alone.   Barbara [name] 63 y.o.  female pleasant patient with stage IV/metastatic urothelial cancer-currently on pembrolizumab  plus Enfortumab is here for follow-up.  Intermittent blood in urine.  Chronic.  Not any worse.  Appetite-up-and-down.  Continues to have mild nausea, using the compazine .  Mild weight loss.  Patient continues eye moisturizing/drops advised.  Rash improved on current regimen. Complains of fatigue.  Review of Systems  Constitutional:  Positive for malaise/fatigue. Negative for chills, diaphoresis, fever and weight loss.  HENT:  Negative for nosebleeds and sore throat.   Eyes:  Negative for double vision.  Respiratory:  Negative for cough, hemoptysis, sputum production, shortness of breath and wheezing.   Cardiovascular:  Negative for chest pain, palpitations, orthopnea and leg swelling.  Gastrointestinal:  Negative for abdominal pain, blood in stool, constipation, diarrhea, heartburn, melena, nausea and vomiting.  Genitourinary:  Negative for dysuria, frequency and urgency.  Musculoskeletal:  Negative for back pain and joint pain.  Skin:  Positive for itching and rash.  Neurological:  Negative for dizziness, tingling, focal weakness, weakness and headaches.  Endo/Heme/Allergies:  Does not bruise/bleed easily.  Psychiatric/Behavioral:  Negative for depression. The patient is not nervous/anxious and does not have insomnia.     MEDICAL HISTORY:  Past Medical History:  Diagnosis Date   Adopted  Allergic rhinitis    Anemia    h/o   Anxiety    Arthritis    B12 deficiency    Bilateral carotid artery  stenosis    Bilateral carotid bruits    Bladder tumor    Cancer (HCC)    bladder cancer   Depression    Gallbladder polyp    GERD (gastroesophageal reflux disease)    Heart murmur    Hydronephrosis of left kidney    Migraines    Tobacco abuse    UTI (urinary tract infection)     SURGICAL HISTORY: Past Surgical History:  Procedure Laterality Date   ABDOMINAL HYSTERECTOMY     BLADDER SURGERY     BREAST BIOPSY Left 2014   neg   CYSTOSCOPY W/ RETROGRADES Bilateral 01/29/2024   Procedure: CYSTOSCOPY, WITH RETROGRADE PYELOGRAM;  Surgeon: Alvaro Ricardo KATHEE Mickey., MD;  Location: [facility];  Service: Urology;  Laterality: Bilateral;   CYSTOSCOPY W/ URETERAL STENT PLACEMENT Left 04/04/2023   Procedure: CYSTOSCOPY WITH RETROGRADE PYELOGRAM/URETERAL STENT PLACEMENT;  Surgeon: Penne Knee, MD;  Location: [facility];  Service: Urology;  Laterality: Left;   CYSTOSCOPY WITH STENT PLACEMENT Left 01/29/2024   Procedure: CYSTOSCOPY, WITH STENT INSERTION;  Surgeon: Alvaro Ricardo KATHEE Mickey., MD;  Location: [facility];  Service: Urology;  Laterality: Left;   FOOT SURGERY Right    2009   IR IMAGING GUIDED PORT INSERTION  05/03/2023   LAPAROSCOPIC APPENDECTOMY  12/05/2012   NASAL SEPTUM SURGERY     ROBOT ASSISTED LAPAROSCOPIC COMPLETE CYSTECT ILEAL CONDUIT N/A 09/25/2023   Procedure: XI ROBOTIC ASSISTED LAPAROSCOPIC EXTENSIVE LYSIS OF ADHESIONS, PERITONEAL BIOPSIES;  Surgeon: Alvaro Ricardo KATHEE Mickey., MD;  Location: [facility];  Service: Urology;  Laterality: N/A;   SISTRUNK PROCEDURE  03/03/2012   Thyroglossal duct cyst excision   TONSILLECTOMY     TRANSURETHRAL RESECTION OF BLADDER TUMOR N/A 04/04/2023   Procedure: TRANSURETHRAL RESECTION OF BLADDER TUMOR (TURBT);  Surgeon: Penne Knee, MD;  Location: [facility];  Service: Urology;  Laterality: N/A;    SOCIAL HISTORY: Social History   Socioeconomic History   Marital status: Married    Spouse name: Not on file   Number of children: Not on file   Years of  education: Not on file   Highest education level: Not on file  Occupational History   Not on file  Tobacco Use   Smoking status: Former    Current packs/day: 0.25    Average packs/day: 0.3 packs/day for 30.0 years (7.5 ttl pk-yrs)    Types: Cigarettes    Passive exposure: Past   Smokeless tobacco: Never  Vaping Use   Vaping status: Never Used  Substance and Sexual Activity   Alcohol  use: Never   Drug use: Never   Sexual activity: Not on file  Other Topics Concern   Not on file  Social History Narrative   Not on file   Social Drivers of Health   Financial Resource Strain: Not on file  Food Insecurity: No Food Insecurity (09/25/2023)   Hunger Vital Sign    Worried About Running Out of Food in the Last Year: Never true    Ran Out of Food in the Last Year: Never true  Transportation Needs: No Transportation Needs (09/25/2023)   PRAPARE - Administrator, Civil Service (Medical): No    Lack of Transportation (Non-Medical): No  Physical Activity: Not on file  Stress: Not on file  Social Connections: Not on file  Intimate Partner Violence: Not At  Risk (09/25/2023)   Humiliation, Afraid, Rape, and Kick questionnaire    Fear of Current or Ex-Partner: No    Emotionally Abused: No    Physically Abused: No    Sexually Abused: No    FAMILY HISTORY: Family History  Adopted: Yes    ALLERGIES:  is allergic to pseudoephedrine hcl.  MEDICATIONS:  Current Outpatient Medications  Medication Sig Dispense Refill   acetaminophen  (TYLENOL ) 500 MG tablet Take 1,000 mg by mouth every 6 (six) hours as needed for moderate pain (pain score 4-6).     carboxymethylcellulose (REFRESH PLUS) 0.5 % SOLN Place 1 drop into both eyes 3 (three) times daily as needed (dry eyes).     carisoprodol  (SOMA ) 350 MG tablet Take 1 tablet (350 mg total) by mouth at bedtime as needed for muscle spasms. 90 tablet 0   escitalopram  (LEXAPRO ) 5 MG tablet Take 1 tablet (5 mg total) by mouth daily. 90 tablet  1   estradiol  (ESTRACE ) 1 MG tablet TAKE 1 TABLET BY MOUTH DAILY 90 tablet 3   famotidine  (PEPCID ) 10 MG tablet Take 10 mg by mouth 2 (two) times daily.     levothyroxine  (SYNTHROID ) 50 MCG tablet Take 1 tablet (50 mcg total) by mouth daily before breakfast. Do not eat or drink until 30 mins- 1 hour after taking the pill. 30 tablet 3   lidocaine -prilocaine  (EMLA ) cream Apply 1 Application topically as needed. 30 g 0   montelukast  (SINGULAIR ) 10 MG tablet Take 1 tablet (10 mg total) by mouth at bedtime. As needed if breaks out into rash 90 tablet 1   ondansetron  (ZOFRAN ) 8 MG tablet Take 1 tablet (8 mg total) by mouth every 8 (eight) hours as needed for up to 30 doses for nausea or vomiting. 30 tablet 0   prochlorperazine  (COMPAZINE ) 10 MG tablet TAKE 1 TABLET BY MOUTH EVERY 6 HOURS AS NEEDED FOR NAUSEA OR VOMITING. 90 tablet 1   topiramate  (TOPAMAX ) 50 MG tablet TAKE 2 TABLETS BY MOUTH AT  BEDTIME 180 tablet 3   traMADol  (ULTRAM ) 50 MG tablet Take 1 tablet (50 mg total) by mouth every 6 (six) hours as needed for moderate pain (pain score 4-6) or severe pain (pain score 7-10) (from bladder cancer). 30 tablet 0   triamcinolone  ointment (KENALOG ) 0.5 % Apply 1 Application topically 2 (two) times daily as needed (on flared up areas of arms and legs). Do not apply on face, armpit and groin. 30 g 0   dicyclomine  (BENTYL ) 10 MG capsule Take 1 capsule (10 mg total) by mouth 3 (three) times daily as needed for up to 30 doses for spasms. 30 capsule 0   No current facility-administered medications for this visit.   Facility-Administered Medications Ordered in Other Visits  Medication Dose Route Frequency Provider Last Rate Last Admin   heparin  lock flush 100 unit/mL  500 Units Intravenous Once Agrawal, Kavita, MD       heparin  lock flush 100 unit/mL  500 Units Intravenous Once Agrawal, Kavita, MD       heparin  lock flush 100 unit/mL  500 Units Intravenous Once Agrawal, Kavita, MD       sodium chloride  flush  (NS) 0.9 % injection 10 mL  10 mL Intravenous Once Agrawal, Kavita, MD       sodium chloride  flush (NS) 0.9 % injection 10 mL  10 mL Intravenous Once Agrawal, Kavita, MD       sodium chloride  flush (NS) 0.9 % injection 10 mL  10 mL Intravenous  Once Agrawal, Kavita, MD        PHYSICAL EXAMINATION:   Vitals:   07/15/24 0835  BP: 113/69  Pulse: 64  Resp: 16  Temp: (!) 95.5 F (35.3 C)  SpO2: 100%   Filed Weights   07/15/24 0835  Weight: 105 lb 9.6 oz (47.9 kg)    Physical Exam Vitals and nursing note reviewed.  HENT:     Head: Normocephalic and atraumatic.     Mouth/Throat:     Pharynx: Oropharynx is clear.  Eyes:     Extraocular Movements: Extraocular movements intact.     Pupils: Pupils are equal, round, and reactive to light.  Cardiovascular:     Rate and Rhythm: Normal rate and regular rhythm.  Pulmonary:     Comments: Decreased breath sounds bilaterally.  Abdominal:     Palpations: Abdomen is soft.  Musculoskeletal:        General: Normal range of motion.     Cervical back: Normal range of motion.  Skin:    General: Skin is warm.  Neurological:     General: No focal deficit present.     Mental Status: She is alert and oriented to person, place, and time.  Psychiatric:        Behavior: Behavior normal.        Judgment: Judgment normal.     LABORATORY DATA:  I have reviewed the data as listed Lab Results  Component Value Date   WBC 6.0 07/15/2024   HGB 11.5 (L) 07/15/2024   HCT 33.6 (L) 07/15/2024   MCV 96.0 07/15/2024   PLT 199 07/15/2024   Recent Labs    06/24/24 0829 06/30/24 0913 07/15/24 0842  NA 135 136 135  K 3.3* 3.4* 3.4*  CL 104 104 105  CO2 23 23 23   GLUCOSE 86 87 84  BUN 20 19 20   CREATININE 1.31* 1.24* 1.33*  CALCIUM  8.6* 8.9 8.7*  GFRNONAA 46* 49* 45*  PROT 7.2 7.4 7.5  ALBUMIN 3.7 3.6 3.7  AST 19 20 19   ALT 7 9 9   ALKPHOS 47 51 49  BILITOT 0.7 0.6 0.2    RADIOGRAPHIC STUDIES: I have personally reviewed the radiological  images as listed and agreed with the findings in the report. No results found.    Malignant neoplasm of urinary bladder [facility]) # Stage/metastatic urothelial cancer; Tempus testing was sent on peritoneal biopsies - no targets currently on Pembro+ Erfortumab-   # PET July 16th, 2025-. Compared with previous PET-CT, generally improved hypermetabolic retroperitoneal and pelvic lymph nodes, consistent with response to treatment. There is a single right pelvic sidewall node which has slightly enlarged compared with the most recent CT and demonstrates mild hypermetabolic activity, suspicious for residual metastatic disease;  New small presacral nodule with mild hypermetabolic activity, suspicious for metastatic disease;  No evidence of metastatic disease in the chest. Will repeat scan in NOV- 2025- stable.   #  Currently on proceed with Pem+Enfor 1mg /kg- day1 today. Labs-CBC/chemistries were reviewed with the patient.  Patient tolerating well except for skin rash see below.     # Renal/Hypokalemia- -Potassium 3.4.   supp- potassium rich food.  Continue monitoring.  CKD stage III - Increase fluid intake- stable.  # Elevated TSH but normal- T4- discussed with patient- will monitor for now.  Hypothyroidism sec to fransico SEED 2025]-RECOMMEND  start synthroid  50 mcg/day-  stable.    # Weight loss: -Continue to follow with nutrition.  Poor taste- wants to HOLD off Zyprexa- stable.    #  Maculopapular rash: Likely secondary to enfortumab vedotin - Initially, started with grade 3 involving more than 30% BSA with severe pruritus, mild fever.  Significantly improved after dose reduction. Continue with supportive care-Claritin, Aveeno moisturizer and Singulair ; Kenalog  0.5% twice daily as needed to the affected areas- stable.  # Mild lower abdominal pain- sec to malignancy- recommend tramadol  as needed. Stable.    # Hematuria: chronic likely due to bladder mass Moderate left hydroureteronephrosis s/p left  ureteral stent- Left ureteral stent last replaced on 01/30/24- awaiting in oct 2025.  Continue follow-up with Dr. Alvaro.stable.    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  Order Thyroid  profile in OCT  PS- # DISPOSITION: # ADD TSH; and Irons tudies/ferriitn to labs today.  # chemo today-  # as per IS- in 1 week-chemo/labs- # as per IS- in 3 weeks- APP # in 6 weeks- MD-# Dr.B  Above plan of care was discussed with patient/family in detail.  My contact information was given to the patient/family.      Cindy JONELLE Joe, MD 07/15/2024 9:18 AM

## 2024-07-15 NOTE — Assessment & Plan Note (Signed)
#   Stage/metastatic urothelial cancer; Tempus testing was sent on peritoneal biopsies - no targets currently on Pembro+ Erfortumab-   # PET July 16th, 2025-. Compared with previous PET-CT, generally improved hypermetabolic retroperitoneal and pelvic lymph nodes, consistent with response to treatment. There is a single right pelvic sidewall node which has slightly enlarged compared with the most recent CT and demonstrates mild hypermetabolic activity, suspicious for residual metastatic disease;  New small presacral nodule with mild hypermetabolic activity, suspicious for metastatic disease;  No evidence of metastatic disease in the chest. Will repeat scan in NOV- 2025- stable.   #  Currently on proceed with Pem+Enfor 1mg /kg- day1 today. Labs-CBC/chemistries were reviewed with the patient.  Patient tolerating well except for skin rash see below.     # Renal/Hypokalemia- -Potassium 3.4.   supp- potassium rich food.  Continue monitoring.  CKD stage III - Increase fluid intake- stable.  # Elevated TSH but normal- T4- discussed with patient- will monitor for now.  Hypothyroidism sec to fransico SEED 2025]-RECOMMEND  start synthroid  50 mcg/day-  stable.    # Weight loss: -Continue to follow with nutrition.  Poor taste- wants to HOLD off Zyprexa- stable.    # Maculopapular rash: Likely secondary to enfortumab vedotin - Initially, started with grade 3 involving more than 30% BSA with severe pruritus, mild fever.  Significantly improved after dose reduction. Continue with supportive care-Claritin, Aveeno moisturizer and Singulair ; Kenalog  0.5% twice daily as needed to the affected areas- stable.  # Mild lower abdominal pain- sec to malignancy- recommend tramadol  as needed. Stable.    # Hematuria: chronic likely due to bladder mass Moderate left hydroureteronephrosis s/p left ureteral stent- Left ureteral stent last replaced on 01/30/24- awaiting in oct 2025.  Continue follow-up with Dr. Alvaro.stable.    # Acid  reflux/gas/bloating-advised to take OTC simethicone, Tums, Pepcid  as needed.stable  # IV Access-port placed on 05/03/2023- functional.  Order Thyroid  profile in OCT  PS- # DISPOSITION: # ADD TSH; and Irons tudies/ferriitn to labs today.  # chemo today-  # as per IS- in 1 week-chemo/labs- # as per IS- in 3 weeks- APP # in 6 weeks- MD-# Dr.B

## 2024-07-21 ENCOUNTER — Inpatient Hospital Stay

## 2024-07-21 VITALS — BP 106/65 | HR 66 | Temp 95.0°F | Resp 18

## 2024-07-21 DIAGNOSIS — C679 Malignant neoplasm of bladder, unspecified: Secondary | ICD-10-CM

## 2024-07-21 DIAGNOSIS — Z5112 Encounter for antineoplastic immunotherapy: Secondary | ICD-10-CM | POA: Diagnosis not present

## 2024-07-21 LAB — CMP (CANCER CENTER ONLY)
ALT: 8 U/L (ref 0–44)
AST: 22 U/L (ref 15–41)
Albumin: 3.7 g/dL (ref 3.5–5.0)
Alkaline Phosphatase: 54 U/L (ref 38–126)
Anion gap: 8 (ref 5–15)
BUN: 24 mg/dL — ABNORMAL HIGH (ref 8–23)
CO2: 22 mmol/L (ref 22–32)
Calcium: 8.8 mg/dL — ABNORMAL LOW (ref 8.9–10.3)
Chloride: 104 mmol/L (ref 98–111)
Creatinine: 1.44 mg/dL — ABNORMAL HIGH (ref 0.44–1.00)
GFR, Estimated: 41 mL/min — ABNORMAL LOW (ref >60)
Glucose, Bld: 126 mg/dL — ABNORMAL HIGH (ref 70–99)
Potassium: 3.2 mmol/L — ABNORMAL LOW (ref 3.5–5.1)
Sodium: 134 mmol/L — ABNORMAL LOW (ref 135–145)
Total Bilirubin: 1 mg/dL (ref 0.0–1.2)
Total Protein: 7.5 g/dL (ref 6.5–8.1)

## 2024-07-21 LAB — CBC WITH DIFFERENTIAL (CANCER CENTER ONLY)
Abs Immature Granulocytes: 0.02 K/uL (ref 0.00–0.07)
Basophils Absolute: 0 K/uL (ref 0.0–0.1)
Basophils Relative: 1 %
Eosinophils Absolute: 0.4 K/uL (ref 0.0–0.5)
Eosinophils Relative: 7 %
HCT: 35.9 % — ABNORMAL LOW (ref 36.0–46.0)
Hemoglobin: 12.2 g/dL (ref 12.0–15.0)
Immature Granulocytes: 0 %
Lymphocytes Relative: 34 %
Lymphs Abs: 1.9 K/uL (ref 0.7–4.0)
MCH: 32.4 pg (ref 26.0–34.0)
MCHC: 34 g/dL (ref 30.0–36.0)
MCV: 95.2 fL (ref 80.0–100.0)
Monocytes Absolute: 0.6 K/uL (ref 0.1–1.0)
Monocytes Relative: 11 %
Neutro Abs: 2.7 K/uL (ref 1.7–7.7)
Neutrophils Relative %: 47 %
Platelet Count: 200 K/uL (ref 150–400)
RBC: 3.77 MIL/uL — ABNORMAL LOW (ref 3.87–5.11)
RDW: 14.2 % (ref 11.5–15.5)
WBC Count: 5.6 K/uL (ref 4.0–10.5)
nRBC: 0 % (ref 0.0–0.2)

## 2024-07-21 MED ORDER — PROCHLORPERAZINE MALEATE 10 MG PO TABS
10.0000 mg | ORAL_TABLET | Freq: Once | ORAL | Status: AC
  Administered 2024-07-21: 10 mg via ORAL
  Filled 2024-07-21: qty 1

## 2024-07-21 MED ORDER — SODIUM CHLORIDE 0.9 % IV SOLN
1.0000 mg/kg | Freq: Once | INTRAVENOUS | Status: AC
  Administered 2024-07-21: 50 mg via INTRAVENOUS
  Filled 2024-07-21: qty 3

## 2024-07-21 MED ORDER — SODIUM CHLORIDE 0.9 % IV SOLN
INTRAVENOUS | Status: DC
  Filled 2024-07-21: qty 250

## 2024-07-29 ENCOUNTER — Other Ambulatory Visit: Payer: Self-pay | Admitting: Internal Medicine

## 2024-07-29 DIAGNOSIS — C679 Malignant neoplasm of bladder, unspecified: Secondary | ICD-10-CM

## 2024-07-30 ENCOUNTER — Encounter: Payer: Self-pay | Admitting: Internal Medicine

## 2024-07-30 ENCOUNTER — Encounter: Payer: Self-pay | Admitting: Nurse Practitioner

## 2024-08-03 ENCOUNTER — Other Ambulatory Visit: Payer: Self-pay | Admitting: Urology

## 2024-08-03 ENCOUNTER — Other Ambulatory Visit: Payer: Self-pay | Admitting: Physician Assistant

## 2024-08-03 DIAGNOSIS — E2839 Other primary ovarian failure: Secondary | ICD-10-CM

## 2024-08-04 ENCOUNTER — Inpatient Hospital Stay: Admitting: Nurse Practitioner

## 2024-08-04 ENCOUNTER — Encounter: Payer: Self-pay | Admitting: Internal Medicine

## 2024-08-04 ENCOUNTER — Encounter: Payer: Self-pay | Admitting: Nurse Practitioner

## 2024-08-04 ENCOUNTER — Inpatient Hospital Stay

## 2024-08-04 ENCOUNTER — Inpatient Hospital Stay: Attending: Internal Medicine

## 2024-08-04 VITALS — BP 103/54 | HR 62 | Resp 16

## 2024-08-04 VITALS — BP 119/70 | HR 68 | Temp 96.4°F | Resp 16 | Ht 65.5 in | Wt 103.6 lb

## 2024-08-04 DIAGNOSIS — C679 Malignant neoplasm of bladder, unspecified: Secondary | ICD-10-CM | POA: Diagnosis present

## 2024-08-04 DIAGNOSIS — Z79899 Other long term (current) drug therapy: Secondary | ICD-10-CM | POA: Insufficient documentation

## 2024-08-04 DIAGNOSIS — E876 Hypokalemia: Secondary | ICD-10-CM

## 2024-08-04 DIAGNOSIS — C786 Secondary malignant neoplasm of retroperitoneum and peritoneum: Secondary | ICD-10-CM | POA: Insufficient documentation

## 2024-08-04 DIAGNOSIS — Z5112 Encounter for antineoplastic immunotherapy: Secondary | ICD-10-CM | POA: Diagnosis not present

## 2024-08-04 DIAGNOSIS — R64 Cachexia: Secondary | ICD-10-CM | POA: Diagnosis not present

## 2024-08-04 LAB — CBC WITH DIFFERENTIAL (CANCER CENTER ONLY)
Abs Immature Granulocytes: 0.01 K/uL (ref 0.00–0.07)
Basophils Absolute: 0 K/uL (ref 0.0–0.1)
Basophils Relative: 1 %
Eosinophils Absolute: 0.5 K/uL (ref 0.0–0.5)
Eosinophils Relative: 10 %
HCT: 32.9 % — ABNORMAL LOW (ref 36.0–46.0)
Hemoglobin: 11.2 g/dL — ABNORMAL LOW (ref 12.0–15.0)
Immature Granulocytes: 0 %
Lymphocytes Relative: 37 %
Lymphs Abs: 1.9 K/uL (ref 0.7–4.0)
MCH: 32.3 pg (ref 26.0–34.0)
MCHC: 34 g/dL (ref 30.0–36.0)
MCV: 94.8 fL (ref 80.0–100.0)
Monocytes Absolute: 0.5 K/uL (ref 0.1–1.0)
Monocytes Relative: 9 %
Neutro Abs: 2.2 K/uL (ref 1.7–7.7)
Neutrophils Relative %: 43 %
Platelet Count: 199 K/uL (ref 150–400)
RBC: 3.47 MIL/uL — ABNORMAL LOW (ref 3.87–5.11)
RDW: 14.5 % (ref 11.5–15.5)
WBC Count: 5.1 K/uL (ref 4.0–10.5)
nRBC: 0 % (ref 0.0–0.2)

## 2024-08-04 LAB — CMP (CANCER CENTER ONLY)
ALT: 8 U/L (ref 0–44)
AST: 19 U/L (ref 15–41)
Albumin: 3.4 g/dL — ABNORMAL LOW (ref 3.5–5.0)
Alkaline Phosphatase: 49 U/L (ref 38–126)
Anion gap: 7 (ref 5–15)
BUN: 20 mg/dL (ref 8–23)
CO2: 22 mmol/L (ref 22–32)
Calcium: 8.4 mg/dL — ABNORMAL LOW (ref 8.9–10.3)
Chloride: 106 mmol/L (ref 98–111)
Creatinine: 1.37 mg/dL — ABNORMAL HIGH (ref 0.44–1.00)
GFR, Estimated: 43 mL/min — ABNORMAL LOW (ref >60)
Glucose, Bld: 87 mg/dL (ref 70–99)
Potassium: 3.1 mmol/L — ABNORMAL LOW (ref 3.5–5.1)
Sodium: 135 mmol/L (ref 135–145)
Total Bilirubin: 0.6 mg/dL (ref 0.0–1.2)
Total Protein: 7.1 g/dL (ref 6.5–8.1)

## 2024-08-04 MED ORDER — POTASSIUM CHLORIDE 10 MEQ/100ML IV SOLN
10.0000 meq | Freq: Once | INTRAVENOUS | Status: AC
  Administered 2024-08-04: 10 meq via INTRAVENOUS
  Filled 2024-08-04: qty 100

## 2024-08-04 MED ORDER — SODIUM CHLORIDE 0.9 % IV SOLN
1.0000 mg/kg | Freq: Once | INTRAVENOUS | Status: AC
  Administered 2024-08-04: 50 mg via INTRAVENOUS
  Filled 2024-08-04: qty 3

## 2024-08-04 MED ORDER — SODIUM CHLORIDE 0.9 % IV SOLN
200.0000 mg | Freq: Once | INTRAVENOUS | Status: AC
  Administered 2024-08-04: 200 mg via INTRAVENOUS
  Filled 2024-08-04: qty 8

## 2024-08-04 MED ORDER — SODIUM CHLORIDE 0.9 % IV SOLN
INTRAVENOUS | Status: DC
  Filled 2024-08-04: qty 250

## 2024-08-04 MED ORDER — PROCHLORPERAZINE MALEATE 10 MG PO TABS
10.0000 mg | ORAL_TABLET | Freq: Once | ORAL | Status: AC
  Administered 2024-08-04: 10 mg via ORAL
  Filled 2024-08-04: qty 1

## 2024-08-04 MED ORDER — OLANZAPINE 5 MG PO TABS: ORAL_TABLET | ORAL | 3 refills | Status: DC

## 2024-08-04 NOTE — Progress Notes (Signed)
 [facility] CONSULT NOTE  Patient Care Team: [name], Barbara Barbara [name] as PCP - General (Physician Assistant) Rennie Barbara SAUNDERS, MD as Consulting Physician (Oncology)  CHIEF COMPLAINTS/PURPOSE OF CONSULTATION: Bladder cancer  Oncology History Overview Note  # Metastatic urothelial carcinoma - Patient developed urinary symptoms around September or October 2023.  Was treated with multiple rounds of antibiotics with no improvement.  She was then referred to Dr. Penne for further evaluation.   - s/p TURBT with left ureteral stent placement by Dr. Penne.  IntraOp findings showed large irregular mucosal submucosal extensive tumor from the left dome, left lateral bladder wall majority of trigone including the left UO and bladder neck.  Measures at least 5 x 5 cm. Pathology showed invasive high-grade urothelial carcinoma. Invades muscularis propria.   - Completed neoadjuvant cycle 4-day 1 of split dose cisplatin  and gemcitabine  (07/23/2023). Pt declined C4D8 due to poor tolerance/nausea/flank pain.  Split dose cisplatin  considered for CKD stage III.   - s/p robotic adhesiolysis + peritoneal biopsies 09/25/23 showed urothelial cancer with peritoneal carcinomatosis and small + large bowel serosal involvement. Original plan was cystectomy and urinary diversion but this was aborted based on intra-op findings.   # JAN 14th, 2025- Start Pem+Erfo   Urothelial cancer (HCC)  04/12/2023 Initial Diagnosis   Urothelial cancer (HCC)   04/12/2023 Cancer Staging   Staging form: Kidney, AJCC 8th Edition - Clinical: Stage III (cT3, cN0, cM0) - Signed by Agrawal, Kavita, MD on 05/07/2023 Stage prefix: Initial diagnosis   09/25/2023 Cancer Staging   Staging form: Kidney, AJCC 8th Edition - Pathologic stage from 09/25/2023: Stage IV (pM1) - Signed by Clista Bimler, MD on 10/25/2023   Malignant neoplasm of urinary bladder (HCC)  04/22/2023 Initial Diagnosis   Malignant neoplasm of urinary bladder  (HCC)   05/07/2023 - 07/23/2023 Chemotherapy   Patient is on Treatment Plan : BLADDER Gemcitabine  D1,8 + Cisplatin  (split dose) D1,8 q21d x 4 cycles     11/05/2023 -  Chemotherapy   Patient is on Treatment Plan : UROTHELIAL ADVANCED, METASTATIC ENFORTUMAB D1, D8 + PEMBROLIZUMAB  (200) D1 Q21D       HISTORY OF PRESENTING ILLNESS: Patient ambulating-independently. Alone.   Barbara [name] 63 y.o. female pleasant patient with stage IV/metastatic urothelial cancer, currently on pembrolizumab  plus enfortumab vedotin , who returns to clinic for follow-up and consideration of continuation of treatment.  She continues to have pruritus but rash has resolved.  Complains of dry skin that has not significantly improved with topicals.  Intermittent blood in her urine which is chronic and not worse.  Appetite is poor and she has lost 2 more pounds.  Has chronic mild nausea and she takes Compazine  nightly.  Reluctant to add new medications.  She does not take any supplements due to poor taste and history of lactose intolerance.  She feels like appetite has decreased somewhat since starting thyroid  medication.  Not on potassium supplements. She has previously met with nutrition.  She is using the eyedrops as advised.  Fatigue is stable.  Endorses constipation with bowel movement every other day which is her baseline.  No fevers or chills.  Review of Systems  Constitutional:  Positive for malaise/fatigue and weight loss. Negative for chills, diaphoresis and fever.  HENT:  Negative for nosebleeds and sore throat.   Eyes:  Negative for double vision.  Respiratory:  Negative for cough, hemoptysis, sputum production, shortness of breath and wheezing.   Cardiovascular:  Negative for chest pain, palpitations, orthopnea and leg  swelling.  Gastrointestinal:  Positive for constipation and nausea. Negative for abdominal pain, blood in stool, diarrhea, heartburn, melena and vomiting.  Genitourinary:  Negative for dysuria,  frequency and urgency.  Musculoskeletal:  Negative for back pain, falls and joint pain.  Skin:  Positive for itching. Negative for rash.  Neurological:  Negative for dizziness, tingling, focal weakness, weakness and headaches.  Endo/Heme/Allergies:  Does not bruise/bleed easily.  Psychiatric/Behavioral:  Negative for depression. The patient is not nervous/anxious and does not have insomnia.    MEDICAL HISTORY:  Past Medical History:  Diagnosis Date   Adopted    Allergic rhinitis    Anemia    h/o   Anxiety    Arthritis    B12 deficiency    Bilateral carotid artery stenosis    Bilateral carotid bruits    Bladder tumor    Cancer (HCC)    bladder cancer   Depression    Gallbladder polyp    GERD (gastroesophageal reflux disease)    Heart murmur    Hydronephrosis of left kidney    Migraines    Tobacco abuse    UTI (urinary tract infection)    SURGICAL HISTORY: Past Surgical History:  Procedure Laterality Date   ABDOMINAL HYSTERECTOMY     BLADDER SURGERY     BREAST BIOPSY Left 2014   neg   CYSTOSCOPY W/ RETROGRADES Bilateral 01/29/2024   Procedure: CYSTOSCOPY, WITH RETROGRADE PYELOGRAM;  Surgeon: Alvaro Ricardo KATHEE Mickey., MD;  Location: [facility];  Service: Urology;  Laterality: Bilateral;   CYSTOSCOPY W/ URETERAL STENT PLACEMENT Left 04/04/2023   Procedure: CYSTOSCOPY WITH RETROGRADE PYELOGRAM/URETERAL STENT PLACEMENT;  Surgeon: Penne Knee, MD;  Location: [facility];  Service: Urology;  Laterality: Left;   CYSTOSCOPY WITH STENT PLACEMENT Left 01/29/2024   Procedure: CYSTOSCOPY, WITH STENT INSERTION;  Surgeon: Alvaro Ricardo KATHEE Mickey., MD;  Location: [facility];  Service: Urology;  Laterality: Left;   FOOT SURGERY Right    2009   IR IMAGING GUIDED PORT INSERTION  05/03/2023   LAPAROSCOPIC APPENDECTOMY  12/05/2012   NASAL SEPTUM SURGERY     ROBOT ASSISTED LAPAROSCOPIC COMPLETE CYSTECT ILEAL CONDUIT N/A 09/25/2023   Procedure: XI ROBOTIC ASSISTED LAPAROSCOPIC EXTENSIVE LYSIS OF  ADHESIONS, PERITONEAL BIOPSIES;  Surgeon: Alvaro Ricardo KATHEE Mickey., MD;  Location: [facility];  Service: Urology;  Laterality: N/A;   SISTRUNK PROCEDURE  03/03/2012   Thyroglossal duct cyst excision   TONSILLECTOMY     TRANSURETHRAL RESECTION OF BLADDER TUMOR N/A 04/04/2023   Procedure: TRANSURETHRAL RESECTION OF BLADDER TUMOR (TURBT);  Surgeon: Penne Knee, MD;  Location: [facility];  Service: Urology;  Laterality: N/A;   SOCIAL HISTORY: Social History   Socioeconomic History   Marital status: Married    Spouse name: Not on file   Number of children: Not on file   Years of education: Not on file   Highest education level: Not on file  Occupational History   Not on file  Tobacco Use   Smoking status: Former    Current packs/day: 0.25    Average packs/day: 0.3 packs/day for 30.0 years (7.5 ttl pk-yrs)    Types: Cigarettes    Passive exposure: Past   Smokeless tobacco: Never  Vaping Use   Vaping status: Never Used  Substance and Sexual Activity   Alcohol  use: Never   Drug use: Never   Sexual activity: Not on file  Other Topics Concern   Not on file  Social History Narrative   Not on file   Social Drivers  of Health   Financial Resource Strain: Not on file  Food Insecurity: No Food Insecurity (09/25/2023)   Hunger Vital Sign    Worried About Running Out of Food in the Last Year: Never true    Ran Out of Food in the Last Year: Never true  Transportation Needs: No Transportation Needs (09/25/2023)   PRAPARE - Administrator, Civil Service (Medical): No    Lack of Transportation (Non-Medical): No  Physical Activity: Not on file  Stress: Not on file  Social Connections: Not on file  Intimate Partner Violence: Not At Risk (09/25/2023)   Humiliation, Afraid, Rape, and Kick questionnaire    Fear of Current or Ex-Partner: No    Emotionally Abused: No    Physically Abused: No    Sexually Abused: No   FAMILY HISTORY: Family History  Adopted: Yes   ALLERGIES:  is  allergic to pseudoephedrine hcl.  MEDICATIONS:  Current Outpatient Medications  Medication Sig Dispense Refill   acetaminophen  (TYLENOL ) 500 MG tablet Take 1,000 mg by mouth every 6 (six) hours as needed for moderate pain (pain score 4-6).     carboxymethylcellulose (REFRESH PLUS) 0.5 % SOLN Place 1 drop into both eyes 3 (three) times daily as needed (dry eyes).     carisoprodol  (SOMA ) 350 MG tablet Take 1 tablet (350 mg total) by mouth at bedtime as needed for muscle spasms. 90 tablet 0   dicyclomine  (BENTYL ) 10 MG capsule Take 1 capsule (10 mg total) by mouth 3 (three) times daily as needed for up to 30 doses for spasms. 30 capsule 0   escitalopram  (LEXAPRO ) 5 MG tablet Take 1 tablet (5 mg total) by mouth daily. 90 tablet 1   estradiol  (ESTRACE ) 1 MG tablet TAKE 1 TABLET BY MOUTH DAILY 90 tablet 3   famotidine  (PEPCID ) 10 MG tablet Take 10 mg by mouth 2 (two) times daily.     levothyroxine  (SYNTHROID ) 50 MCG tablet Take 1 tablet (50 mcg total) by mouth daily before breakfast. Do not eat or drink until 30 mins- 1 hour after taking the pill. 30 tablet 3   lidocaine -prilocaine  (EMLA ) cream Apply 1 Application topically as needed. 30 g 0   montelukast  (SINGULAIR ) 10 MG tablet Take 1 tablet (10 mg total) by mouth at bedtime. As needed if breaks out into rash 90 tablet 1   ondansetron  (ZOFRAN ) 8 MG tablet Take 1 tablet (8 mg total) by mouth every 8 (eight) hours as needed for up to 30 doses for nausea or vomiting. 30 tablet 0   prochlorperazine  (COMPAZINE ) 10 MG tablet TAKE 1 TABLET BY MOUTH EVERY 6 HOURS AS NEEDED FOR NAUSEA OR VOMITING. 90 tablet 1   topiramate  (TOPAMAX ) 50 MG tablet TAKE 2 TABLETS BY MOUTH AT  BEDTIME 180 tablet 3   traMADol  (ULTRAM ) 50 MG tablet Take 1 tablet (50 mg total) by mouth every 6 (six) hours as needed for moderate pain (pain score 4-6) or severe pain (pain score 7-10) (from bladder cancer). 30 tablet 0   triamcinolone  ointment (KENALOG ) 0.5 % Apply 1 Application  topically 2 (two) times daily as needed (on flared up areas of arms and legs). Do not apply on face, armpit and groin. 30 g 0   No current facility-administered medications for this visit.   Facility-Administered Medications Ordered in Other Visits  Medication Dose Route Frequency Provider Last Rate Last Admin   heparin  lock flush 100 unit/mL  500 Units Intravenous Once Agrawal, Kavita, MD  heparin  lock flush 100 unit/mL  500 Units Intravenous Once Agrawal, Kavita, MD       heparin  lock flush 100 unit/mL  500 Units Intravenous Once Agrawal, Kavita, MD       sodium chloride  flush (NS) 0.9 % injection 10 mL  10 mL Intravenous Once Agrawal, Kavita, MD       sodium chloride  flush (NS) 0.9 % injection 10 mL  10 mL Intravenous Once Agrawal, Kavita, MD       sodium chloride  flush (NS) 0.9 % injection 10 mL  10 mL Intravenous Once Agrawal, Kavita, MD        PHYSICAL EXAMINATION: Vitals:   08/04/24 1017  BP: 119/70  Pulse: 68  Resp: 16  Temp: (!) 96.4 F (35.8 C)  SpO2: 100%   Filed Weights   08/04/24 1017  Weight: 103 lb 9.6 oz (47 kg)   Physical Exam Vitals reviewed.  Constitutional:      Appearance: She is not ill-appearing.     Comments: Thin build  HENT:     Head: Normocephalic and atraumatic.     Mouth/Throat:     Pharynx: Oropharynx is clear.  Cardiovascular:     Rate and Rhythm: Normal rate and regular rhythm.  Pulmonary:     Effort: No respiratory distress.  Abdominal:     General: There is no distension.     Palpations: Abdomen is soft.  Musculoskeletal:        General: No deformity.     Right lower leg: No edema.     Left lower leg: No edema.  Skin:    General: Skin is warm.     Findings: No rash.     Comments: Dry skin  Neurological:     Mental Status: She is alert and oriented to person, place, and time.  Psychiatric:        Mood and Affect: Mood normal.        Behavior: Behavior normal.    LABORATORY DATA:  I have reviewed the data as listed Lab  Results  Component Value Date   WBC 5.1 08/04/2024   HGB 11.2 (L) 08/04/2024   HCT 32.9 (L) 08/04/2024   MCV 94.8 08/04/2024   PLT 199 08/04/2024   Recent Labs    07/15/24 0842 07/21/24 1206 08/04/24 1022  NA 135 134* 135  K 3.4* 3.2* 3.1*  CL 105 104 106  CO2 23 22 22   GLUCOSE 84 126* 87  BUN 20 24* 20  CREATININE 1.33* 1.44* 1.37*  CALCIUM  8.7* 8.8* 8.4*  GFRNONAA 45* 41* 43*  PROT 7.5 7.5 7.1  ALBUMIN 3.7 3.7 3.4*  AST 19 22 19   ALT 9 8 8   ALKPHOS 49 54 49  BILITOT 0.2 1.0 0.6    RADIOGRAPHIC STUDIES: I have personally reviewed the radiological images as listed and agreed with the findings in the report. No results found.  Assessment & Plan:  Malignant neoplasm of urinary bladder (HCC) # Stage/metastatic urothelial cancer; Tempus testing was sent on peritoneal biopsies- no targets.  Currently on pembrolizumab  plus enfortumab vedotin .  # PET scan May 06, 2024-compared with previous PET/CT, generally improved.  Hypermetabolic retroperitoneal and pelvic lymph nodes consistent with response to treatment.  There was a single right pelvic sidewall node which had slightly enlarged compared with most recent CT and demonstrated mild hypermetabolic activity suspicious for metastatic disease.  New small presacral nodule with mild hypermetabolic activity, suspicious for metastatic disease.  No evidence of metastatic disease in the chest.  Plan to repeat PET/CT in November 2025 after cycle 13, prior to cycle 14.   #  Labs today reviewed and acceptable for treatment. Overall tolerating well except for rash (see below). Proceed with D1C12 pembro + enfortumab today.  She will return to clinic in 1 week for labs and day 8 cycle 12.  # Maculopapular Rash- likely secondary to enfortumab. Initially grade 3, involving > 30% BSA with severe pruritus and mild fever. Significantly improved after dose reduction. Today, rash has resolved and she has some ongoing itching which is likely related to  her dry skin.  Refractory to emollients.  Advised to continue supportive care including Claritin, topical emollients, Singulair , Kenalog  0.5% twice daily as needed.  Monitor.  # Hypokalemia-potassium 3.1.  No diarrhea.  Likely secondary to poor oral intake.  Reluctant to start new medications.  Okay to hold oral potassium for now.  Will plan to give 10 mill equivalents KCl IV today and again next week.  # CKD- stage III.  GFR slightly improved compared to last cycle.  Continue to improve intake, particularly in fluids.     # Elevated TSH but normal- T4.  Secondary to Keytruda .  August 2025. She was started on Synthroid  50 mcg/day.  Plan to check TSH and T4 with each cycle.   # Weight loss: 2 lbs today. Encouraged supplements- provided sample of ensure complete and can mix with fairlife milk for once a day calorie boost. Today she is inagreement to trial olanzapine 5 mg at night.      # Mild lower abdominal pain- sec to malignancy- recommend tramadol  as needed. Stable.    # Hematuria: chronic likely due to bladder mass Moderate left hydroureteronephrosis s/p left ureteral stent- Left ureteral stent last replaced on 01/30/24-she is scheduled with Dr. Alvaro on 08/12/2024.   # Acid reflux/gas/bloating- Previously advised to take OTC simethicone, Tums, Pepcid  as needed. She does not complain of this today.    # Constipation- encouraged miralax  1-3 times a day to maintain stool every to every other day.   # Port a cath- placed on 05/03/23. Functioning appropriately   Order Thyroid  profile monthly  PS-  # DISPOSITION: # chemo today +/- potassium # 1 week- chemo per IS +/- potassium  # 3 weeks- lab (cbc, cmp, tsh, t4), Dr Rennie, +/- [phone] pembro-enfortumab +/- potassium # week later- [id number] +/- potassium - la  No problem-specific Assessment & Plan notes found for this encounter.  Above plan of care was discussed with patient/family in detail.  My contact information was given to the  patient/family.   Barbara KANDICE Dawn, NP 08/04/2024

## 2024-08-04 NOTE — Progress Notes (Signed)
 Appetite 50% normal, nausea. Pain in lt flank 4/10.

## 2024-08-05 ENCOUNTER — Telehealth: Payer: Self-pay | Admitting: *Deleted

## 2024-08-05 ENCOUNTER — Encounter: Payer: Self-pay | Admitting: Nurse Practitioner

## 2024-08-05 NOTE — Telephone Encounter (Signed)
 Spoke with patient concerning her surgery next week on the 22nd. She is scheduled for TURT. Pt states she has urinary obstruction and they will be exchanging her stents. She is having chemo on Oct 21 with enfortumab-Padcev . Reviewed labs with Dr Melanee who states patient should be fine with procedure. Notified pt and told her to please notify our office for any problems after procedure, ie bleeding, fever etc. Pt in agreement to call us .

## 2024-08-06 NOTE — Progress Notes (Signed)
 Sent message, via epic in basket, requesting orders in epic from Careers adviser.

## 2024-08-06 NOTE — Progress Notes (Addendum)
 Date of COVID positive in last 90 days:  No  PCP - Tinnie Pro, PA-C Cardiologist -  N/A Oncologist - Genevia Rous, MD  Chest x-ray - N/A EKG - N/A Stress Test - N/A ECHO - N/A Cardiac Cath - N/A Pacemaker/ICD device last checked:N/A Spinal Cord Stimulator:N/A  Bowel Prep - N/A  Sleep Study - N/A CPAP -   Fasting Blood Sugar - N/A Checks Blood Sugar _____ times a day  Last dose of GLP1 agonist-  N/A GLP1 instructions:  Do not take after     Last dose of SGLT-2 inhibitors-  N/A SGLT-2 instructions:  Do not take after    Blood Thinner Instructions: N/A Last dose:   Time: Aspirin Instructions:N/A Last Dose:  Activity level:  Can go up a flight of stairs and perform activities of daily living without stopping and without symptoms of chest pain or shortness of breath.  Anesthesia review:  Murmur  Patient denies shortness of breath, fever, cough and chest pain at PAT appointment  Patient verbalized understanding of instructions that were given to them at the PAT appointment. Patient was also instructed that they will need to review over the PAT instructions again at home before surgery.

## 2024-08-06 NOTE — Patient Instructions (Addendum)
 SURGICAL WAITING ROOM VISITATION Patients having surgery or a procedure may have no more than 2 support people in the waiting area - these visitors may rotate.    Children under the age of 80 must have an adult with them who is not the patient.  If the patient needs to stay at the hospital during part of their recovery, the visitor guidelines for inpatient rooms apply. Pre-op nurse will coordinate an appropriate time for 1 support person to accompany patient in pre-op.  This support person may not rotate.    Please refer to the [facility] website for the visitor guidelines for Inpatients (after your surgery is over and you are in a regular room).       Your procedure is scheduled on: 08-12-24   Report to [facility] Main Entrance    Report to admitting at 11:10 AM   Call this number if you have problems the morning of surgery [phone]   Do not eat food or drink liquids :After Midnight.           If you have questions, please contact your surgeon's office.   FOLLOW  ANY ADDITIONAL PRE OP INSTRUCTIONS YOU RECEIVED FROM YOUR SURGEON'S OFFICE!!!     Oral Hygiene is also important to reduce your risk of infection.                                    Remember - BRUSH YOUR TEETH THE MORNING OF SURGERY WITH YOUR REGULAR TOOTHPASTE   Do NOT smoke after Midnight   Take these medicines the morning of surgery with A SIP OF WATER :    Levothyroxine    Tylenol , Tramadol  if needed  Stop all vitamins and herbal supplements 7 days before surgery                              You may not have any metal on your body including hair pins, jewelry, and body piercing             Do not wear make-up, lotions, powders, perfumes or deodorant  Do not wear nail polish including gel and S&S, artificial/acrylic nails, or any other type of covering on natural nails including finger and toenails. If you have artificial nails, gel coating, etc. that needs to be removed by a nail salon please have  this removed prior to surgery or surgery may need to be canceled/ delayed if the surgeon/ anesthesia feels like they are unable to be safely monitored.   Do not shave  48 hours prior to surgery.      Do not bring valuables to the hospital. [facility] IS NOT RESPONSIBLE   FOR VALUABLES.   Contacts, dentures or bridgework may not be worn into surgery.  DO NOT BRING YOUR HOME MEDICATIONS TO THE HOSPITAL. PHARMACY WILL DISPENSE MEDICATIONS LISTED ON YOUR MEDICATION LIST TO YOU DURING YOUR ADMISSION IN THE HOSPITAL!    Patients discharged on the day of surgery will not be allowed to drive home.  Someone NEEDS to stay with you for the first 24 hours after anesthesia.              Please read over the following fact sheets you were given: IF YOU HAVE QUESTIONS ABOUT YOUR PRE-OP INSTRUCTIONS PLEASE CALL [phone] [facility]  If you received a COVID test during your pre-op visit  it  is requested that you wear a mask when out in public, stay away from anyone that may not be feeling well and notify your surgeon if you develop symptoms. If you test positive for Covid or have been in contact with anyone that has tested positive in the last 10 days please notify you surgeon.  [facility] - Preparing for Surgery Before surgery, you can play an important role.  Because skin is not sterile, your skin needs to be as free of germs as possible.  You can reduce the number of germs on your skin by washing with CHG (chlorahexidine gluconate) soap before surgery.  CHG is an antiseptic cleaner which kills germs and bonds with the skin to continue killing germs even after washing. Please DO NOT use if you have an allergy to CHG or antibacterial soaps.  If your skin becomes reddened/irritated stop using the CHG and inform your nurse when you arrive at Short Stay. Do not shave (including legs and underarms) for at least 48 hours prior to the first CHG shower.  You may shave your face/neck.  Please follow these instructions  carefully:  1.  Shower with CHG Soap the night before surgery and the  morning of surgery.  2.  If you choose to wash your hair, wash your hair first as usual with your normal  shampoo.  3.  After you shampoo, rinse your hair and body thoroughly to remove the shampoo.                             4.  Use CHG as you would any other liquid soap.  You can apply chg directly to the skin and wash.  Gently with a scrungie or clean washcloth.  5.  Apply the CHG Soap to your body ONLY FROM THE NECK DOWN.   Do   not use on face/ open                           Wound or open sores. Avoid contact with eyes, ears mouth and   genitals (private parts).                       Wash face,  Genitals (private parts) with your normal soap.             6.  Wash thoroughly, paying special attention to the area where your    surgery  will be performed.  7.  Thoroughly rinse your body with warm water  from the neck down.  8.  DO NOT shower/wash with your normal soap after using and rinsing off the CHG Soap.                9.  Pat yourself dry with a clean towel.            10.  Wear clean pajamas.            11.  Place clean sheets on your bed the night of your first shower and do not  sleep with pets. Day of Surgery : Do not apply any lotions/deodorants the morning of surgery.  Please wear clean clothes to the hospital/surgery center.  FAILURE TO FOLLOW THESE INSTRUCTIONS MAY RESULT IN THE CANCELLATION OF YOUR SURGERY  PATIENT SIGNATURE_________________________________  NURSE SIGNATURE__________________________________  ________________________________________________________________________

## 2024-08-10 ENCOUNTER — Encounter: Payer: Self-pay | Admitting: Internal Medicine

## 2024-08-10 ENCOUNTER — Encounter: Payer: Self-pay | Admitting: Nurse Practitioner

## 2024-08-10 ENCOUNTER — Encounter (HOSPITAL_COMMUNITY): Payer: Self-pay

## 2024-08-10 ENCOUNTER — Other Ambulatory Visit: Payer: Self-pay

## 2024-08-10 ENCOUNTER — Encounter (HOSPITAL_COMMUNITY)
Admission: RE | Admit: 2024-08-10 | Discharge: 2024-08-10 | Disposition: A | Discharge status: Home / Self Care | Source: Ambulatory Visit | Attending: Urology | Admitting: Urology

## 2024-08-10 VITALS — BP 128/69 | HR 65 | Temp 98.0°F | Resp 14 | Ht 65.0 in | Wt 101.2 lb

## 2024-08-10 DIAGNOSIS — N289 Disorder of kidney and ureter, unspecified: Secondary | ICD-10-CM | POA: Insufficient documentation

## 2024-08-10 DIAGNOSIS — Z01812 Encounter for preprocedural laboratory examination: Secondary | ICD-10-CM | POA: Insufficient documentation

## 2024-08-10 HISTORY — DX: Hypothyroidism, unspecified: E03.9

## 2024-08-10 LAB — BASIC METABOLIC PANEL WITH GFR
Anion gap: 9 (ref 5–15)
BUN: 20 mg/dL (ref 8–23)
CO2: 25 mmol/L (ref 22–32)
Calcium: 9.3 mg/dL (ref 8.9–10.3)
Chloride: 103 mmol/L (ref 98–111)
Creatinine, Ser: 1.33 mg/dL — ABNORMAL HIGH (ref 0.44–1.00)
GFR, Estimated: 45 mL/min — ABNORMAL LOW (ref >60)
Glucose, Bld: 82 mg/dL (ref 70–99)
Potassium: 3.4 mmol/L — ABNORMAL LOW (ref 3.5–5.1)
Sodium: 137 mmol/L (ref 135–145)

## 2024-08-11 ENCOUNTER — Inpatient Hospital Stay

## 2024-08-11 VITALS — BP 94/66 | HR 72 | Temp 96.5°F | Resp 18

## 2024-08-11 DIAGNOSIS — Z5112 Encounter for antineoplastic immunotherapy: Secondary | ICD-10-CM | POA: Diagnosis not present

## 2024-08-11 DIAGNOSIS — C679 Malignant neoplasm of bladder, unspecified: Secondary | ICD-10-CM

## 2024-08-11 LAB — CMP (CANCER CENTER ONLY)
ALT: 8 U/L (ref 0–44)
AST: 19 U/L (ref 15–41)
Albumin: 3.5 g/dL (ref 3.5–5.0)
Alkaline Phosphatase: 51 U/L (ref 38–126)
Anion gap: 8 (ref 5–15)
BUN: 24 mg/dL — ABNORMAL HIGH (ref 8–23)
CO2: 23 mmol/L (ref 22–32)
Calcium: 8.7 mg/dL — ABNORMAL LOW (ref 8.9–10.3)
Chloride: 106 mmol/L (ref 98–111)
Creatinine: 1.21 mg/dL — ABNORMAL HIGH (ref 0.44–1.00)
GFR, Estimated: 50 mL/min — ABNORMAL LOW (ref >60)
Glucose, Bld: 81 mg/dL (ref 70–99)
Potassium: 3.4 mmol/L — ABNORMAL LOW (ref 3.5–5.1)
Sodium: 137 mmol/L (ref 135–145)
Total Bilirubin: 0.6 mg/dL (ref 0.0–1.2)
Total Protein: 7.2 g/dL (ref 6.5–8.1)

## 2024-08-11 LAB — CBC WITH DIFFERENTIAL (CANCER CENTER ONLY)
Abs Immature Granulocytes: 0.02 K/uL (ref 0.00–0.07)
Basophils Absolute: 0 K/uL (ref 0.0–0.1)
Basophils Relative: 1 %
Eosinophils Absolute: 0.4 K/uL (ref 0.0–0.5)
Eosinophils Relative: 8 %
HCT: 32.9 % — ABNORMAL LOW (ref 36.0–46.0)
Hemoglobin: 11.3 g/dL — ABNORMAL LOW (ref 12.0–15.0)
Immature Granulocytes: 0 %
Lymphocytes Relative: 39 %
Lymphs Abs: 1.8 K/uL (ref 0.7–4.0)
MCH: 32.7 pg (ref 26.0–34.0)
MCHC: 34.3 g/dL (ref 30.0–36.0)
MCV: 95.1 fL (ref 80.0–100.0)
Monocytes Absolute: 0.5 K/uL (ref 0.1–1.0)
Monocytes Relative: 11 %
Neutro Abs: 1.9 K/uL (ref 1.7–7.7)
Neutrophils Relative %: 41 %
Platelet Count: 177 K/uL (ref 150–400)
RBC: 3.46 MIL/uL — ABNORMAL LOW (ref 3.87–5.11)
RDW: 14.5 % (ref 11.5–15.5)
WBC Count: 4.6 K/uL (ref 4.0–10.5)
nRBC: 0 % (ref 0.0–0.2)

## 2024-08-11 MED ORDER — SODIUM CHLORIDE 0.9 % IV SOLN
1.0000 mg/kg | Freq: Once | INTRAVENOUS | Status: AC
  Administered 2024-08-11: 50 mg via INTRAVENOUS
  Filled 2024-08-11: qty 3

## 2024-08-11 MED ORDER — SODIUM CHLORIDE 0.9 % IV SOLN
INTRAVENOUS | Status: DC
  Filled 2024-08-11: qty 250

## 2024-08-11 MED ORDER — PROCHLORPERAZINE MALEATE 10 MG PO TABS
10.0000 mg | ORAL_TABLET | Freq: Once | ORAL | Status: AC
  Administered 2024-08-11: 10 mg via ORAL
  Filled 2024-08-11: qty 1

## 2024-08-12 ENCOUNTER — Ambulatory Visit (HOSPITAL_COMMUNITY): Admitting: Anesthesiology

## 2024-08-12 ENCOUNTER — Encounter (HOSPITAL_COMMUNITY): Admission: RE | Disposition: A | Payer: Self-pay | Source: Ambulatory Visit | Attending: Urology

## 2024-08-12 ENCOUNTER — Other Ambulatory Visit: Payer: Self-pay

## 2024-08-12 ENCOUNTER — Ambulatory Visit (HOSPITAL_COMMUNITY)
Admission: RE | Admit: 2024-08-12 | Discharge: 2024-08-12 | Disposition: A | Discharge status: Home / Self Care | Source: Ambulatory Visit | Attending: Urology | Admitting: Urology

## 2024-08-12 ENCOUNTER — Encounter (HOSPITAL_COMMUNITY): Payer: Self-pay | Admitting: Urology

## 2024-08-12 ENCOUNTER — Ambulatory Visit (HOSPITAL_COMMUNITY)

## 2024-08-12 DIAGNOSIS — N131 Hydronephrosis with ureteral stricture, not elsewhere classified: Secondary | ICD-10-CM | POA: Diagnosis not present

## 2024-08-12 DIAGNOSIS — D099 Carcinoma in situ, unspecified: Secondary | ICD-10-CM | POA: Insufficient documentation

## 2024-08-12 DIAGNOSIS — K219 Gastro-esophageal reflux disease without esophagitis: Secondary | ICD-10-CM | POA: Diagnosis not present

## 2024-08-12 DIAGNOSIS — C674 Malignant neoplasm of posterior wall of bladder: Secondary | ICD-10-CM | POA: Diagnosis present

## 2024-08-12 DIAGNOSIS — Z7722 Contact with and (suspected) exposure to environmental tobacco smoke (acute) (chronic): Secondary | ICD-10-CM | POA: Insufficient documentation

## 2024-08-12 DIAGNOSIS — E039 Hypothyroidism, unspecified: Secondary | ICD-10-CM | POA: Diagnosis not present

## 2024-08-12 DIAGNOSIS — C679 Malignant neoplasm of bladder, unspecified: Secondary | ICD-10-CM | POA: Insufficient documentation

## 2024-08-12 HISTORY — PX: CYSTOSCOPY W/ RETROGRADES: SHX1426

## 2024-08-12 HISTORY — PX: CYSTOSCOPY W/ URETERAL STENT PLACEMENT: SHX1429

## 2024-08-12 HISTORY — PX: TRANSURETHRAL RESECTION OF BLADDER TUMOR: SHX2575

## 2024-08-12 SURGERY — TURBT (TRANSURETHRAL RESECTION OF BLADDER TUMOR)
Anesthesia: General

## 2024-08-12 MED ORDER — LIDOCAINE HCL (PF) 2 % IJ SOLN
INTRAMUSCULAR | Status: AC
  Filled 2024-08-12: qty 5

## 2024-08-12 MED ORDER — DEXAMETHASONE SOD PHOSPHATE PF 10 MG/ML IJ SOLN
INTRAMUSCULAR | Status: DC | PRN
  Administered 2024-08-12: 10 mg via INTRAVENOUS

## 2024-08-12 MED ORDER — LACTATED RINGERS IV SOLN: INTRAVENOUS | Status: DC

## 2024-08-12 MED ORDER — CHLORHEXIDINE GLUCONATE 0.12 % MT SOLN
15.0000 mL | Freq: Once | OROMUCOSAL | Status: AC
  Administered 2024-08-12: 15 mL via OROMUCOSAL

## 2024-08-12 MED ORDER — FENTANYL CITRATE (PF) 100 MCG/2ML IJ SOLN
INTRAMUSCULAR | Status: AC
  Filled 2024-08-12: qty 2

## 2024-08-12 MED ORDER — ONDANSETRON HCL 4 MG/2ML IJ SOLN
INTRAMUSCULAR | Status: AC
  Filled 2024-08-12: qty 2

## 2024-08-12 MED ORDER — EPHEDRINE 5 MG/ML INJ
INTRAVENOUS | Status: AC
  Filled 2024-08-12: qty 5

## 2024-08-12 MED ORDER — ONDANSETRON HCL 4 MG/2ML IJ SOLN
INTRAMUSCULAR | Status: DC | PRN
  Administered 2024-08-12: 4 mg via INTRAVENOUS

## 2024-08-12 MED ORDER — TRAMADOL HCL 50 MG PO TABS
ORAL_TABLET | ORAL | Status: AC
  Filled 2024-08-12: qty 1

## 2024-08-12 MED ORDER — FENTANYL CITRATE (PF) 50 MCG/ML IJ SOSY: 25.0000 ug | PREFILLED_SYRINGE | INTRAMUSCULAR | Status: DC | PRN

## 2024-08-12 MED ORDER — PROPOFOL 10 MG/ML IV BOLUS
INTRAVENOUS | Status: AC
  Filled 2024-08-12: qty 20

## 2024-08-12 MED ORDER — MIDAZOLAM HCL 2 MG/2ML IJ SOLN
INTRAMUSCULAR | Status: AC
  Filled 2024-08-12: qty 2

## 2024-08-12 MED ORDER — ONDANSETRON HCL 4 MG/2ML IJ SOLN: 4.0000 mg | Freq: Once | INTRAMUSCULAR | Status: DC | PRN

## 2024-08-12 MED ORDER — ORAL CARE MOUTH RINSE: 15.0000 mL | Freq: Once | OROMUCOSAL | Status: AC

## 2024-08-12 MED ORDER — ACETAMINOPHEN 325 MG PO TABS
650.0000 mg | ORAL_TABLET | Freq: Once | ORAL | Status: AC
  Administered 2024-08-12: 650 mg via ORAL
  Filled 2024-08-12: qty 2

## 2024-08-12 MED ORDER — DEXTROSE 5 % IV SOLN
5.0000 mg/kg | INTRAVENOUS | Status: AC
  Administered 2024-08-12: 240 mg via INTRAVENOUS
  Filled 2024-08-12: qty 6

## 2024-08-12 MED ORDER — IOHEXOL 300 MG/ML  SOLN
INTRAMUSCULAR | Status: DC | PRN
  Administered 2024-08-12: 10 mL

## 2024-08-12 MED ORDER — EPHEDRINE SULFATE (PRESSORS) 25 MG/5ML IV SOSY
PREFILLED_SYRINGE | INTRAVENOUS | Status: DC | PRN
  Administered 2024-08-12: 10 mg via INTRAVENOUS

## 2024-08-12 MED ORDER — FENTANYL CITRATE (PF) 100 MCG/2ML IJ SOLN
INTRAMUSCULAR | Status: DC | PRN
  Administered 2024-08-12 (×2): 25 ug via INTRAVENOUS

## 2024-08-12 MED ORDER — TRAMADOL HCL 50 MG PO TABS
50.0000 mg | ORAL_TABLET | Freq: Once | ORAL | Status: AC
  Administered 2024-08-12: 50 mg via ORAL

## 2024-08-12 MED ORDER — LIDOCAINE HCL (PF) 2 % IJ SOLN
INTRAMUSCULAR | Status: DC | PRN
  Administered 2024-08-12: 60 mg via INTRADERMAL

## 2024-08-12 MED ORDER — PROPOFOL 10 MG/ML IV BOLUS
INTRAVENOUS | Status: DC | PRN
  Administered 2024-08-12: 80 mg via INTRAVENOUS

## 2024-08-12 MED ORDER — SODIUM CHLORIDE 0.9 % IR SOLN
Status: DC | PRN
  Administered 2024-08-12: 3000 mL
  Administered 2024-08-12: 1000 mL
  Administered 2024-08-12: 6000 mL

## 2024-08-12 MED ORDER — TRAMADOL HCL 50 MG PO TABS: 50.0000 mg | ORAL_TABLET | Freq: Four times a day (QID) | ORAL | 0 refills | Status: AC | PRN

## 2024-08-12 MED ORDER — ACETAMINOPHEN 10 MG/ML IV SOLN: 650.0000 mg | Freq: Once | INTRAVENOUS | Status: DC | PRN

## 2024-08-12 MED ORDER — MIDAZOLAM HCL 5 MG/5ML IJ SOLN
INTRAMUSCULAR | Status: DC | PRN
  Administered 2024-08-12: 2 mg via INTRAVENOUS

## 2024-08-12 SURGICAL SUPPLY — 21 items
BAG URINE DRAIN 2000ML AR STRL (UROLOGICAL SUPPLIES) IMPLANT
BAG URO CATCHER STRL LF (MISCELLANEOUS) ×2 IMPLANT
BASKET ZERO TIP NITINOL 2.4FR (BASKET) IMPLANT
CATH URETL OPEN END 6FR 70 (CATHETERS) IMPLANT
CLOTH BEACON ORANGE TIMEOUT ST (SAFETY) ×2 IMPLANT
DRAPE FOOT SWITCH (DRAPES) ×2 IMPLANT
ELECT REM PT RETURN 15FT ADLT (MISCELLANEOUS) ×2 IMPLANT
GLOVE SURG LX STRL 7.5 STRW (GLOVE) ×2 IMPLANT
GOWN STRL REUS W/ TWL XL LVL3 (GOWN DISPOSABLE) ×2 IMPLANT
GUIDEWIRE ANG ZIPWIRE 038X150 (WIRE) ×2 IMPLANT
GUIDEWIRE STR DUAL SENSOR (WIRE) ×2 IMPLANT
KIT TURNOVER KIT A (KITS) ×2 IMPLANT
LOOP CUT BIPOLAR 24F LRG (ELECTROSURGICAL) IMPLANT
MANIFOLD NEPTUNE II (INSTRUMENTS) ×2 IMPLANT
NS IRRIG 1000ML POUR BTL (IV SOLUTION) IMPLANT
PACK CYSTO (CUSTOM PROCEDURE TRAY) ×2 IMPLANT
STENT CONTOUR 8FR X 24 (STENTS) IMPLANT
SYRINGE TOOMEY IRRIG 70ML (MISCELLANEOUS) IMPLANT
TUBE PU 8FR 16IN ENFIT (TUBING) IMPLANT
TUBING CONNECTING 10 (TUBING) ×2 IMPLANT
TUBING UROLOGY SET (TUBING) ×2 IMPLANT

## 2024-08-12 NOTE — Discharge Instructions (Signed)
 1 - You may have urinary urgency (bladder spasms) and bloody urine on / off with stent in place. This is normal.  2 - Call MD or go to ER for fever >102, severe pain / nausea / vomiting not relieved by medications, or acute change in medical status

## 2024-08-12 NOTE — Anesthesia Procedure Notes (Signed)
 Procedure Name: LMA Insertion Date/Time: 08/12/2024 1:36 PM  Performed by: Carleton Garnette SAUNDERS, CRNAPre-anesthesia Checklist: Patient identified, Emergency Drugs available, Suction available, Timeout performed and Patient being monitored Patient Re-evaluated:Patient Re-evaluated prior to induction Oxygen Delivery Method: Circle system utilized Preoxygenation: Pre-oxygenation with 100% oxygen Induction Type: IV induction LMA: LMA inserted LMA Size: 4.0 Tube type: Oral Number of attempts: 1 Placement Confirmation: positive ETCO2 and breath sounds checked- equal and bilateral Tube secured with: Tape Dental Injury: Teeth and Oropharynx as per pre-operative assessment

## 2024-08-12 NOTE — Transfer of Care (Signed)
 Immediate Anesthesia Transfer of Care Note  Patient: Barbara [name]  Procedure(s) Performed: TURBT (TRANSURETHRAL RESECTION OF BLADDER TUMOR) STENT EXCHANGE (Left) CYSTOSCOPY, WITH RETROGRADE PYELOGRAM (Left)  Patient Location: PACU  Anesthesia Type:General  Level of Consciousness: sedated  Airway & Oxygen Therapy: Patient Spontanous Breathing and Patient connected to face mask oxygen  Post-op Assessment: Report given to RN and Post -op Vital signs reviewed and stable  Post vital signs: Reviewed and stable  Last Vitals:  Vitals Value Taken Time  BP    Temp    Pulse 78 08/12/24 14:20  Resp 9 08/12/24 14:20  SpO2 100 % 08/12/24 14:20  Vitals shown include unfiled device data.  Last Pain:  Vitals:   08/12/24 1156  TempSrc:   PainSc: 3       Patients Stated Pain Goal: 5 (08/12/24 1151)  Complications: No notable events documented.

## 2024-08-12 NOTE — H&P (Signed)
 Barbara [name] is an 63 y.o. female.    Chief Complaint: Pre-OP LEFT Ureteral STent Exchange  HPI:   1 - Metastatic, Unresectable Bladder Cancer - s/p aborted cystectomy and left ureteral stent exchange (8x24 contour) 09/2023 for intraoperatively metastatic (dense malignant adhesions, peritoneal caricnomatosis, BX proven). Left JJ stent in place. Single ureters bilaterally. S/p 4 cycles neoadjuvant chemo with Kavita Agrawal MD from med-onc at [facility] finishing around 06/2023.  Recent Course: 12/2023 - PET and CT - stable small voluem nodal and extravescial disease on enfortumab with Dr. Clista, Cr 1.2 01/2024 - OR exchange left ureteral stent; 04/2024 - PET - decreased / stable nodal disease ==> stray Pem/Enfor with Dr. 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 Her friend Arlean is very involved. Her PCP is Leita Pro.  Today  Barbara [name]  is seen to proceed with LEFT ureteral stent exchange for malignant hydro. No interval fevers. Cr 1.2, Hgb 11.3 most recently.      Past Medical History:  Diagnosis Date   Adopted    Allergic rhinitis    Anemia    h/o   Anxiety    Arthritis    B12 deficiency    Bilateral carotid artery stenosis    Bilateral carotid bruits    Bladder tumor    Cancer (HCC)    bladder cancer   Depression    Gallbladder polyp    GERD (gastroesophageal reflux disease)    Heart murmur    Hydronephrosis of left kidney    Hypothyroidism    Migraines    Tobacco abuse    UTI (urinary tract infection)     Past Surgical History:  Procedure Laterality Date   ABDOMINAL HYSTERECTOMY     BLADDER SURGERY     BREAST BIOPSY Left 2014   neg   CYSTOSCOPY W/ RETROGRADES Bilateral 01/29/2024   Procedure: CYSTOSCOPY, WITH RETROGRADE PYELOGRAM;  Surgeon: Alvaro Ricardo KATHEE Mickey., MD;  Location: [facility];  Service: Urology;   Laterality: Bilateral;   CYSTOSCOPY W/ URETERAL STENT PLACEMENT Left 04/04/2023   Procedure: CYSTOSCOPY WITH RETROGRADE PYELOGRAM/URETERAL STENT PLACEMENT;  Surgeon: Penne Knee, MD;  Location: [facility];  Service: Urology;  Laterality: Left;   CYSTOSCOPY WITH STENT PLACEMENT Left 01/29/2024   Procedure: CYSTOSCOPY, WITH STENT INSERTION;  Surgeon: Alvaro Ricardo KATHEE Mickey., MD;  Location: [facility];  Service: Urology;  Laterality: Left;   FOOT SURGERY Right    2009   IR IMAGING GUIDED PORT INSERTION  05/03/2023   LAPAROSCOPIC APPENDECTOMY  12/05/2012   NASAL SEPTUM SURGERY     ROBOT ASSISTED LAPAROSCOPIC COMPLETE CYSTECT ILEAL CONDUIT N/A 09/25/2023   Procedure: XI ROBOTIC ASSISTED LAPAROSCOPIC EXTENSIVE LYSIS OF ADHESIONS, PERITONEAL BIOPSIES;  Surgeon: Alvaro Ricardo KATHEE Mickey., MD;  Location: [facility];  Service: Urology;  Laterality: N/A;   SISTRUNK PROCEDURE  03/03/2012   Thyroglossal duct cyst excision   TONSILLECTOMY     TRANSURETHRAL RESECTION OF BLADDER TUMOR N/A 04/04/2023   Procedure: TRANSURETHRAL RESECTION OF BLADDER TUMOR (TURBT);  Surgeon: Penne Knee, MD;  Location: [facility];  Service: Urology;  Laterality: N/A;    Family History  Adopted: Yes   Social History:  reports that she has quit smoking. Her smoking use included cigarettes. She has a 7.5 pack-year smoking history. She has been exposed to tobacco smoke. She has never used smokeless tobacco.  She reports that she does not drink alcohol  and does not use drugs.  Allergies:  Allergies  Allergen Reactions   Pseudoephedrine Hcl Other (See Comments)    Jittery    No medications prior to admission.    Results for orders placed or performed in visit on 08/11/24 (from the past 48 hours)  CMP (Cancer Center only)     Status: Abnormal   Collection Time: 08/11/24 12:05 PM  Result Value Ref Range   Sodium 137 135 - 145 mmol/L   Potassium 3.4 (L) 3.5 - 5.1 mmol/L   Chloride 106 98 - 111 mmol/L   CO2 23 22 - 32 mmol/L    Glucose, Bld 81 70 - 99 mg/dL    Comment: Glucose reference range applies only to samples taken after fasting for at least 8 hours.   BUN 24 (H) 8 - 23 mg/dL   Creatinine 8.78 (H) 9.55 - 1.00 mg/dL   Calcium  8.7 (L) 8.9 - 10.3 mg/dL   Total Protein 7.2 6.5 - 8.1 g/dL   Albumin 3.5 3.5 - 5.0 g/dL   AST 19 15 - 41 U/L   ALT 8 0 - 44 U/L   Alkaline Phosphatase 51 38 - 126 U/L   Total Bilirubin 0.6 0.0 - 1.2 mg/dL   GFR, Estimated 50 (L) >60 mL/min    Comment: (NOTE) Calculated using the CKD-EPI Creatinine Equation (2021)    Anion gap 8 5 - 15    Comment: Performed at [facility], [address] [place], [place] [address]  CBC with Differential (Cancer Center Only)     Status: Abnormal   Collection Time: 08/11/24 12:05 PM  Result Value Ref Range   WBC Count 4.6 4.0 - 10.5 K/uL   RBC 3.46 (L) 3.87 - 5.11 MIL/uL   Hemoglobin 11.3 (L) 12.0 - 15.0 g/dL   HCT 67.0 (L) 63.9 - 53.9 %   MCV 95.1 80.0 - 100.0 fL   MCH 32.7 26.0 - 34.0 pg   MCHC 34.3 30.0 - 36.0 g/dL   RDW 85.4 88.4 - 84.4 %   Platelet Count 177 150 - 400 K/uL   nRBC 0.0 0.0 - 0.2 %   Neutrophils Relative % 41 %   Neutro Abs 1.9 1.7 - 7.7 K/uL   Lymphocytes Relative 39 %   Lymphs Abs 1.8 0.7 - 4.0 K/uL   Monocytes Relative 11 %   Monocytes Absolute 0.5 0.1 - 1.0 K/uL   Eosinophils Relative 8 %   Eosinophils Absolute 0.4 0.0 - 0.5 K/uL   Basophils Relative 1 %   Basophils Absolute 0.0 0.0 - 0.1 K/uL   Immature Granulocytes 0 %   Abs Immature Granulocytes 0.02 0.00 - 0.07 K/uL    Comment: Performed at [facility], [address] [place], [place] [address]   No results found.  Review of Systems  Constitutional:  Negative for chills and fever.  All other systems reviewed and are negative.   There were no vitals taken for this visit. Physical Exam Vitals reviewed.  Constitutional:      Comments: Very pleasant. Stable alopecia of treatment  HENT:     Head: Normocephalic.  Eyes:     Pupils:  Pupils are equal, round, and reactive to light.  Cardiovascular:     Rate and Rhythm: Normal rate.  Pulmonary:     Effort: Pulmonary effort is normal.  Abdominal:     General: Abdomen is flat.     Comments: Prior scars w/o  hernias  Genitourinary:    Comments: No CVAT at present Musculoskeletal:        General: Normal range of motion.     Cervical back: Normal range of motion.  Neurological:     General: No focal deficit present.     Mental Status: She is alert.  Psychiatric:        Mood and Affect: Mood normal.      Assessment/Plan  Proceed as planned with cysto, left retrograde and stent exchange for malignant hydro with goal of maintaioning GFR and remaining immune therapy candidate. Risks,  benefits, alternatives, expected peri-op course discussed previously and reiterated today.   Ricardo KATHEE Alvaro Mickey., MD 08/12/2024, 6:54 AM

## 2024-08-12 NOTE — Brief Op Note (Signed)
 08/12/2024  2:11 PM  PATIENT:  Barbara [name]  63 y.o. female  PRE-OPERATIVE DIAGNOSIS:  bladder cancer  POST-OPERATIVE DIAGNOSIS:  bladder cancer  PROCEDURE:  Procedure(s): TURBT (TRANSURETHRAL RESECTION OF BLADDER TUMOR) (N/A) STENT EXCHANGE (Left) CYSTOSCOPY, WITH RETROGRADE PYELOGRAM (Left)  SURGEON:  Surgeons and Role:    * Manny, Ricardo KATHEE Raddle., MD - Primary  PHYSICIAN ASSISTANT:   ASSISTANTS: none   ANESTHESIA:   general  EBL:  20mL   BLOOD ADMINISTERED:none  DRAINS: none   LOCAL MEDICATIONS USED:  NONE  SPECIMEN:  Source of Specimen:  bladder tumor  DISPOSITION OF SPECIMEN:  PATHOLOGY  COUNTS:  YES  TOURNIQUET:  * No tourniquets in log *  DICTATION: .Other Dictation: Dictation Number [id number]  PLAN OF CARE: Discharge to home after PACU  PATIENT DISPOSITION:  PACU - hemodynamically stable.   Delay start of Pharmacological VTE agent (>24hrs) due to surgical blood loss or risk of bleeding: yes

## 2024-08-12 NOTE — Anesthesia Preprocedure Evaluation (Addendum)
 Anesthesia Evaluation  Patient identified by MRN, date of birth, ID band Patient awake    Reviewed: Allergy & Precautions, NPO status , Patient's Chart, lab work & pertinent test results  History of Anesthesia Complications Negative for: history of anesthetic complications  Airway Mallampati: I  TM Distance: >3 FB Neck ROM: Full    Dental no notable dental hx. (+) Teeth Intact, Dental Advisory Given   Pulmonary former smoker   Pulmonary exam normal breath sounds clear to auscultation       Cardiovascular (-) hypertension(-) angina (-) Past MI negative cardio ROS Normal cardiovascular exam Rhythm:Regular Rate:Normal     Neuro/Psych  Headaches  Anxiety Depression       GI/Hepatic Neg liver ROS,GERD  ,,  Endo/Other  Hypothyroidism    Renal/GU Renal diseaseMetastatic, Unresectable Bladder Cancer Lab Results      Component                Value               Date                            K                        3.4 (L)             08/11/2024                CO2                      23                  08/11/2024                BUN                      24 (H)              08/11/2024                CREATININE               1.21 (H)            08/11/2024                GFRNONAA                 50 (L)              08/11/2024                           negative genitourinary   Musculoskeletal  (+) Arthritis ,    Abdominal   Peds  Hematology  (+) Blood dyscrasia (Hgb 10.4), anemia Lab Results      Component                Value               Date                      WBC                      4.6                 08/11/2024  HGB                      11.3 (L)            08/11/2024                HCT                      32.9 (L)            08/11/2024                MCV                      95.1                08/11/2024                PLT                      177                 08/11/2024               Anesthesia Other Findings Day of surgery medications reviewed with patient.  Reproductive/Obstetrics negative OB ROS                              Anesthesia Physical Anesthesia Plan  ASA: 2  Anesthesia Plan: General   Post-op Pain Management: Tylenol  PO (pre-op)*   Induction: Intravenous  PONV Risk Score and Plan: 3 and Treatment may vary due to age or medical condition, Ondansetron , Dexamethasone  and Midazolam   Airway Management Planned: LMA  Additional Equipment: None  Intra-op Plan:   Post-operative Plan: Extubation in OR  Informed Consent: I have reviewed the patients History and Physical, chart, labs and discussed the procedure including the risks, benefits and alternatives for the proposed anesthesia with the patient or authorized representative who has indicated his/her understanding and acceptance.     Dental advisory given  Plan Discussed with: CRNA and Surgeon  Anesthesia Plan Comments:         Anesthesia Quick Evaluation

## 2024-08-12 NOTE — Op Note (Signed)
 NAMEPEMA, THOMURE MEDICAL RECORD NO: [id number] ACCOUNT NO: [id number] DATE OF BIRTH: 11/19/1960 FACILITY: [facility] LOCATION: [facility] PHYSICIAN: Ricardo Likens, MD  Operative Report   DATE OF PROCEDURE: 08/12/2024  SURGEON: Ricardo Likens, MD  PREOPERATIVE DIAGNOSIS: Metastatic bladder cancer with left malignant ureteral obstruction.  PROCEDURE PERFORMED: 1.   Cystoscopy with left retrograde pyelograms with interpretation. 2.  Exchange of left ureteral stent. 3.  Transurethral resection of bladder tumor, volume medium.  ESTIMATED BLOOD LOSS: 20 mL.  COMPLICATIONS: None.  SPECIMENS:  Bladder tumor fragments for pathology.  FINDINGS: 1.  Some intraluminal recurrence of bladder tumor, mostly nodular in the trigone and left hemitrigone area. 2.  Mild left hydronephrosis to the left distal ureter where there is frank tumor overlying the UO. 3.  Successful exchange of left ureteral stent, proximal end in the renal pelvis and distal end in the urinary bladder. 4.  Approximately 3 cm area of resection of papillary tumor.  INDICATIONS: The patient is a very pleasant but unfortunate 63 year old lady with a history of metastatic bladder cancer, now on immunotherapy with a quite impressive response.  She also has mild hydronephrosis managed with chronic stenting, which she  tolerates well.  It has been approximately 6 months since her most recent stent exchange.  As she remains on immunotherapy with good functional status, we agreed upon the path of stent exchange to maintain patency of this and keep her as an immunotherapy  candidate.  And she presents for this today.  Informed consent was obtained and placed in the medical record.   DESCRIPTION OF PROCEDURE:  The patient being Barbara [name] was verified and the procedure being was taken to left ureteral stent exchange and possible transurethral resection of bladder tumor was confirmed.  Procedure time out was performed.  Intravenous   antibiotics were administered.  General anesthesia was induced.  The patient was placed into low lithotomy position and a sterile field was created, prepped and draped the patient's vagina, introitus and proximal thighs using iodine.  Cystourethroscopy  was performed using a 21-French rigid cystoscope with offset lens.  Inspection of bladder revealed no calcifications, but there was some recurrent papillary and nodular tumor in the trigone and left hemitrigone area in the region of the in situ left  ureteral stent. At this portion, I felt that I could visualize the left ureteral orifice fairly well. Therefore, the stent was removed and set aside for discard.  Attempts were made to recannulate the left ureter; however, this was not successful.  There  was some nodular and papillary tumor in the area, and it was felt that likely tumor effect was obliterating the ureteral orifice.  The cystoscope was then exchanged for a 26-French resectoscope sheath with a visual obturator, and using a medium-sized loop, resection was performed down to the superficial fibromuscular stroma of the urinary bladder of some recurrent nodular and  papillary tissue in the anterior trigone and left ureteral orifice area.  This allowed better visualization of the area, and the true ureteral orifice was much easier to identify. It was cannulated with an open-ended catheter, and a left retrograde  pyelogram was obtained.  Left retrograde pyelogram demonstrated single left ureter and single system left kidney.  There was mild hydronephrosis in the distal ureter consistent with known tumor and partial obstruction.  Given the visual findings, it was clearly felt that stent  exchange was warranted as her tumor was directly invading the area of the ureteral orifice.   A new 8  x 24 Contour-type stent was carefully placed using cystoscopic and fluoroscopic guidance.  Good proximal and distal plane were noted.  The bladder was emptied of  tumor fragments. Hemostasis appeared acceptable, and the procedure was terminated.  The patient tolerated the procedure well.  No immediate periprocedural complications.  The patient was taken to postanesthesia care unit in stable condition.   MUK D: 08/12/2024 2:16:35 pm T: 08/12/2024 11:23:00 pm  JOB: [id number]/ [id number]

## 2024-08-13 ENCOUNTER — Encounter (HOSPITAL_COMMUNITY): Payer: Self-pay | Admitting: Urology

## 2024-08-13 LAB — SURGICAL PATHOLOGY

## 2024-08-13 NOTE — Anesthesia Postprocedure Evaluation (Signed)
 Anesthesia Post Note  Patient: Barbara [name]  Procedure(s) Performed: TURBT (TRANSURETHRAL RESECTION OF BLADDER TUMOR) STENT EXCHANGE (Left) CYSTOSCOPY, WITH RETROGRADE PYELOGRAM (Left)     Patient location during evaluation: PACU Anesthesia Type: General Level of consciousness: awake and alert Pain management: pain level controlled Vital Signs Assessment: post-procedure vital signs reviewed and stable Respiratory status: spontaneous breathing, nonlabored ventilation, respiratory function stable and patient connected to nasal cannula oxygen Cardiovascular status: blood pressure returned to baseline and stable Postop Assessment: no apparent nausea or vomiting Anesthetic complications: no   No notable events documented.  Last Vitals:  Vitals:   08/12/24 1500 08/12/24 1515  BP: 139/76 134/70  Pulse: 81 71  Resp: 10 13  Temp:    SpO2: 99% 96%    Last Pain:  Vitals:   08/12/24 1536  TempSrc:   PainSc: 4                  Garnette DELENA Gab

## 2024-08-25 ENCOUNTER — Encounter: Payer: Self-pay | Admitting: Internal Medicine

## 2024-08-26 ENCOUNTER — Other Ambulatory Visit: Payer: Self-pay | Admitting: Nurse Practitioner

## 2024-08-26 ENCOUNTER — Inpatient Hospital Stay: Attending: Internal Medicine

## 2024-08-26 ENCOUNTER — Encounter: Payer: Self-pay | Admitting: Internal Medicine

## 2024-08-26 ENCOUNTER — Inpatient Hospital Stay: Admitting: Internal Medicine

## 2024-08-26 ENCOUNTER — Inpatient Hospital Stay

## 2024-08-26 VITALS — BP 116/75 | HR 73 | Temp 95.6°F | Resp 16 | Ht 65.0 in | Wt 99.5 lb

## 2024-08-26 VITALS — BP 108/60 | HR 67 | Resp 16

## 2024-08-26 DIAGNOSIS — C679 Malignant neoplasm of bladder, unspecified: Secondary | ICD-10-CM

## 2024-08-26 DIAGNOSIS — Z79620 Long term (current) use of immunosuppressive biologic: Secondary | ICD-10-CM | POA: Diagnosis not present

## 2024-08-26 DIAGNOSIS — Z5112 Encounter for antineoplastic immunotherapy: Secondary | ICD-10-CM | POA: Diagnosis present

## 2024-08-26 DIAGNOSIS — C786 Secondary malignant neoplasm of retroperitoneum and peritoneum: Secondary | ICD-10-CM | POA: Diagnosis present

## 2024-08-26 LAB — T4, FREE: Free T4: 0.93 ng/dL (ref 0.61–1.12)

## 2024-08-26 LAB — CMP (CANCER CENTER ONLY)
ALT: 7 U/L (ref 0–44)
AST: 19 U/L (ref 15–41)
Albumin: 3.4 g/dL — ABNORMAL LOW (ref 3.5–5.0)
Alkaline Phosphatase: 56 U/L (ref 38–126)
Anion gap: 8 (ref 5–15)
BUN: 18 mg/dL (ref 8–23)
CO2: 22 mmol/L (ref 22–32)
Calcium: 8.5 mg/dL — ABNORMAL LOW (ref 8.9–10.3)
Chloride: 106 mmol/L (ref 98–111)
Creatinine: 1.32 mg/dL — ABNORMAL HIGH (ref 0.44–1.00)
GFR, Estimated: 45 mL/min — ABNORMAL LOW (ref >60)
Glucose, Bld: 89 mg/dL (ref 70–99)
Potassium: 3.2 mmol/L — ABNORMAL LOW (ref 3.5–5.1)
Sodium: 136 mmol/L (ref 135–145)
Total Bilirubin: 0.6 mg/dL (ref 0.0–1.2)
Total Protein: 7.1 g/dL (ref 6.5–8.1)

## 2024-08-26 LAB — CBC WITH DIFFERENTIAL (CANCER CENTER ONLY)
Abs Immature Granulocytes: 0.02 K/uL (ref 0.00–0.07)
Basophils Absolute: 0 K/uL (ref 0.0–0.1)
Basophils Relative: 1 %
Eosinophils Absolute: 0.5 K/uL (ref 0.0–0.5)
Eosinophils Relative: 9 %
HCT: 34.4 % — ABNORMAL LOW (ref 36.0–46.0)
Hemoglobin: 11.6 g/dL — ABNORMAL LOW (ref 12.0–15.0)
Immature Granulocytes: 0 %
Lymphocytes Relative: 41 %
Lymphs Abs: 2.3 K/uL (ref 0.7–4.0)
MCH: 32.5 pg (ref 26.0–34.0)
MCHC: 33.7 g/dL (ref 30.0–36.0)
MCV: 96.4 fL (ref 80.0–100.0)
Monocytes Absolute: 0.5 K/uL (ref 0.1–1.0)
Monocytes Relative: 9 %
Neutro Abs: 2.3 K/uL (ref 1.7–7.7)
Neutrophils Relative %: 40 %
Platelet Count: 211 K/uL (ref 150–400)
RBC: 3.57 MIL/uL — ABNORMAL LOW (ref 3.87–5.11)
RDW: 14.4 % (ref 11.5–15.5)
WBC Count: 5.7 K/uL (ref 4.0–10.5)
nRBC: 0 % (ref 0.0–0.2)

## 2024-08-26 LAB — TSH: TSH: 9.308 u[IU]/mL — ABNORMAL HIGH (ref 0.350–4.500)

## 2024-08-26 MED ORDER — SODIUM CHLORIDE 0.9 % IV SOLN
200.0000 mg | Freq: Once | INTRAVENOUS | Status: AC
  Administered 2024-08-26: 200 mg via INTRAVENOUS
  Filled 2024-08-26: qty 8

## 2024-08-26 MED ORDER — SODIUM CHLORIDE 0.9 % IV SOLN
INTRAVENOUS | Status: DC
  Filled 2024-08-26: qty 250

## 2024-08-26 MED ORDER — SODIUM CHLORIDE 0.9 % IV SOLN
1.0000 mg/kg | Freq: Once | INTRAVENOUS | Status: AC
  Administered 2024-08-26: 50 mg via INTRAVENOUS
  Filled 2024-08-26: qty 3

## 2024-08-26 MED ORDER — PROCHLORPERAZINE MALEATE 10 MG PO TABS
10.0000 mg | ORAL_TABLET | Freq: Once | ORAL | Status: AC
  Administered 2024-08-26: 10 mg via ORAL
  Filled 2024-08-26: qty 1

## 2024-08-26 NOTE — Patient Instructions (Signed)
 [facility] - A DEPT OF [facility]  Discharge Instructions: Thank you for choosing [facility] to provide your oncology and hematology care.  If you have a lab appointment with the Cancer Center, please go directly to the Cancer Center and check in at the registration area.  Wear comfortable clothing and clothing appropriate for easy access to any Portacath or PICC line.   We strive to give you quality time with your provider. You may need to reschedule your appointment if you arrive late (15 or more minutes).  Arriving late affects you and other patients whose appointments are after yours.  Also, if you miss three or more appointments without notifying the office, you may be dismissed from the clinic at the provider's discretion.      For prescription refill requests, have your pharmacy contact our office and allow 72 hours for refills to be completed.    Today you received the following chemotherapy and/or immunotherapy agents Padcev , Keytruda       To help prevent nausea and vomiting after your treatment, we encourage you to take your nausea medication as directed.  BELOW ARE SYMPTOMS THAT SHOULD BE REPORTED IMMEDIATELY: *FEVER GREATER THAN 100.4 F (38 C) OR HIGHER *CHILLS OR SWEATING *NAUSEA AND VOMITING THAT IS NOT CONTROLLED WITH YOUR NAUSEA MEDICATION *UNUSUAL SHORTNESS OF BREATH *UNUSUAL BRUISING OR BLEEDING *URINARY PROBLEMS (pain or burning when urinating, or frequent urination) *BOWEL PROBLEMS (unusual diarrhea, constipation, pain near the anus) TENDERNESS IN MOUTH AND THROAT WITH OR WITHOUT PRESENCE OF ULCERS (sore throat, sores in mouth, or a toothache) UNUSUAL RASH, SWELLING OR PAIN  UNUSUAL VAGINAL DISCHARGE OR ITCHING   Items with * indicate a potential emergency and should be followed up as soon as possible or go to the Emergency Department if any problems should occur.  Please show the CHEMOTHERAPY ALERT CARD or  IMMUNOTHERAPY ALERT CARD at check-in to the Emergency Department and triage nurse.  Should you have questions after your visit or need to cancel or reschedule your appointment, please contact [facility] - A DEPT OF [facility]  [phone] and follow the prompts.  Office hours are 8:00 a.m. to 4:30 p.m. Monday - Friday. Please note that voicemails left after 4:00 p.m. may not be returned until the following business day.  We are closed weekends and major holidays. You have access to a nurse at all times for urgent questions. Please call the main number to the clinic [phone] and follow the prompts.  For any non-urgent questions, you may also contact your provider using MyChart. We now offer e-Visits for anyone 94 and older to request care online for non-urgent symptoms. For details visit [email or web address].   Also download the MyChart app! Go to the app store, search MyChart, open the app, select [facility], and log in with your MyChart username and password.

## 2024-08-26 NOTE — Assessment & Plan Note (Addendum)
#   Stage/metastatic urothelial cancer; Tempus testing was sent on peritoneal biopsies - no targets currently on Pembro+ Erfortumab-   # PET July 16th, 2025-. Compared with previous PET-CT, generally improved hypermetabolic retroperitoneal and pelvic lymph nodes, consistent with response to treatment. There is a single right pelvic sidewall node which has slightly enlarged compared with the most recent CT and demonstrates mild hypermetabolic activity, suspicious for residual metastatic disease;  New small presacral nodule with mild hypermetabolic activity, suspicious for metastatic disease;  No evidence of metastatic disease in the chest.   #  Currently on proceed with Pem+Enfor 1mg /kg- day1 today. Labs-CBC/chemistries were reviewed with the patient.  Will order PET scan after this cycle.  Ordered today.  # Renal/Hypokalemia- -Potassium 3.2 .   supp- potassium rich food.  Continue monitoring.  CKD stage III - Increase fluid intake- stable.  # Elevated TSH but normal- T4- discussed with patient- will monitor for now.  Hypothyroidism sec to jerome- [AUG 2025]-RECOMMEND  COMPLAINCE with Synthroid  50 mcg/day-    # Weight loss: -Continue to follow with nutrition. Recommend starting Zyprexa-    # Maculopapular rash: Likely secondary to enfortumab vedotin - Initially, started with grade 3 involving more than 30% BSA with severe pruritus, mild fever.  Significantly improved after dose reduction. Continue with supportive care-Claritin, Aveeno moisturizer and Singulair ; Kenalog  0.5% twice daily as needed to the affected areas- stable.  # Mild lower abdominal pain- sec to malignancy- recommend tramadol  as needed. Stable.    # Hematuria: [Dr. 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  Continue follow-up with Dr.  Alvaro- again in 6-9 months.     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  Order Thyroid  profile in OCT  PS- # DISPOSITION: # PET scan in 3 weeks- ordered # chemo today-  # as per IS- in 1 week-chemo/labs- # as per IS in 4 weeks- MD; labs-;chemo; in 5 weeks- labs- chemo Dr.B

## 2024-08-26 NOTE — Progress Notes (Signed)
 [facility] CONSULT NOTE  Patient Care Team: McDonough, Tinnie [name] as PCP - General (Physician Assistant) Rennie Cindy SAUNDERS, MD as Consulting Physician (Oncology)  CHIEF COMPLAINTS/PURPOSE OF CONSULTATION: Bladder cancer  Oncology History Overview Note  # Metastatic urothelial carcinoma - Patient developed urinary symptoms around September or October 2023.  Was treated with multiple rounds of antibiotics with no improvement.  She was then referred to Dr. Penne for further evaluation.   - s/p TURBT with left ureteral stent placement by Dr. Penne.  IntraOp findings showed large irregular mucosal submucosal extensive tumor from the left dome, left lateral bladder wall majority of trigone including the left UO and bladder neck.  Measures at least 5 x 5 cm. Pathology showed invasive high-grade urothelial carcinoma. Invades muscularis propria.   - Completed neoadjuvant cycle 4-day 1 of split dose cisplatin  and gemcitabine  (07/23/2023). Pt declined C4D8 due to poor tolerance/nausea/flank pain.  Split dose cisplatin  considered for CKD stage III.   - s/p robotic adhesiolysis + peritoneal biopsies 09/25/23 showed urothelial cancer with peritoneal carcinomatosis and small + large bowel serosal involvement. Original plan was cystectomy and urinary diversion but this was aborted based on intra-op findings.   # JAN 14th, 2025- Start Pem+Erfo   Urothelial cancer (HCC)  04/12/2023 Initial Diagnosis   Urothelial cancer (HCC)   04/12/2023 Cancer Staging   Staging form: Kidney, AJCC 8th Edition - Clinical: Stage III (cT3, cN0, cM0) - Signed by Agrawal, Kavita, MD on 05/07/2023 Stage prefix: Initial diagnosis   09/25/2023 Cancer Staging   Staging form: Kidney, AJCC 8th Edition - Pathologic stage from 09/25/2023: Stage IV (pM1) - Signed by Clista Bimler, MD on 10/25/2023   Malignant neoplasm of urinary bladder (HCC)  04/22/2023 Initial Diagnosis   Malignant neoplasm of urinary bladder  (HCC)   05/07/2023 - 07/23/2023 Chemotherapy   Patient is on Treatment Plan : BLADDER Gemcitabine  D1,8 + Cisplatin  (split dose) D1,8 q21d x 4 cycles     11/05/2023 -  Chemotherapy   Patient is on Treatment Plan : UROTHELIAL ADVANCED, METASTATIC ENFORTUMAB D1, D8 + PEMBROLIZUMAB  (200) D1 Q21D       Patient ambulating-independently.   Alone.   Discussed the use of AI scribe software for clinical note transcription with the patient, who gave verbal consent to proceed.  History of Present Illness   Barbara [name] is a 63 year old female with bladder cancer who presents for follow-up.  She recently underwent a procedure to replace her stent, during which tumor scraping was performed to open the area where the stent exits. Post-surgery, she experienced hematuria, which has been clearing up.  She has a history of weight loss, which she attributes to her thyroid  medication causing a lack of appetite and constipation. She has stopped taking the thyroid  medication due to these side effects. Her recent thyroid  levels were nine, with previous values of six and eight.  She continues to experience abdominal pain, which she associates with the recent surgery. No diarrhea is present, and she continues to use eye drops as prescribed.  She was prescribed olanzapine by another provider to help with nausea and appetite, but she has not yet started taking it. She is also taking Compazine  at night for nausea.  Her potassium levels are low at 3.2, and she attempts to manage this by consuming bananas and avocados.      Review of Systems  Constitutional:  Positive for malaise/fatigue. Negative for chills, diaphoresis, fever and weight loss.  HENT:  Negative for nosebleeds and sore throat.   Eyes:  Negative for double vision.  Respiratory:  Negative for cough, hemoptysis, sputum production, shortness of breath and wheezing.   Cardiovascular:  Negative for chest pain, palpitations, orthopnea and leg swelling.   Gastrointestinal:  Negative for abdominal pain, blood in stool, constipation, diarrhea, heartburn, melena, nausea and vomiting.  Genitourinary:  Negative for dysuria, frequency and urgency.  Musculoskeletal:  Negative for back pain and joint pain.  Skin:  Positive for itching and rash.  Neurological:  Negative for dizziness, tingling, focal weakness, weakness and headaches.  Endo/Heme/Allergies:  Does not bruise/bleed easily.  Psychiatric/Behavioral:  Negative for depression. The patient is not nervous/anxious and does not have insomnia.     MEDICAL HISTORY:  Past Medical History:  Diagnosis Date   Adopted    Allergic rhinitis    Anemia    h/o   Anxiety    Arthritis    B12 deficiency    Bilateral carotid artery stenosis    Bilateral carotid bruits    Bladder tumor    Cancer (HCC)    bladder cancer   Depression    Gallbladder polyp    GERD (gastroesophageal reflux disease)    Heart murmur    Hydronephrosis of left kidney    Hypothyroidism    Migraines    Tobacco abuse    UTI (urinary tract infection)     SURGICAL HISTORY: Past Surgical History:  Procedure Laterality Date   ABDOMINAL HYSTERECTOMY     BLADDER SURGERY     BREAST BIOPSY Left 2014   neg   CYSTOSCOPY W/ RETROGRADES Bilateral 01/29/2024   Procedure: CYSTOSCOPY, WITH RETROGRADE PYELOGRAM;  Surgeon: Alvaro Ricardo KATHEE Mickey., MD;  Location: [facility];  Service: Urology;  Laterality: Bilateral;   CYSTOSCOPY W/ RETROGRADES Left 08/12/2024   Procedure: CYSTOSCOPY, WITH RETROGRADE PYELOGRAM;  Surgeon: Alvaro Ricardo KATHEE Mickey., MD;  Location: [facility];  Service: Urology;  Laterality: Left;   CYSTOSCOPY W/ URETERAL STENT PLACEMENT Left 04/04/2023   Procedure: CYSTOSCOPY WITH RETROGRADE PYELOGRAM/URETERAL STENT PLACEMENT;  Surgeon: Penne Knee, MD;  Location: [facility];  Service: Urology;  Laterality: Left;   CYSTOSCOPY W/ URETERAL STENT PLACEMENT Left 08/12/2024   Procedure: STENT EXCHANGE;  Surgeon: Alvaro Ricardo KATHEE Mickey.,  MD;  Location: [facility];  Service: Urology;  Laterality: Left;   CYSTOSCOPY WITH STENT PLACEMENT Left 01/29/2024   Procedure: CYSTOSCOPY, WITH STENT INSERTION;  Surgeon: Alvaro Ricardo KATHEE Mickey., MD;  Location: [facility];  Service: Urology;  Laterality: Left;   FOOT SURGERY Right    2009   IR IMAGING GUIDED PORT INSERTION  05/03/2023   LAPAROSCOPIC APPENDECTOMY  12/05/2012   NASAL SEPTUM SURGERY     ROBOT ASSISTED LAPAROSCOPIC COMPLETE CYSTECT ILEAL CONDUIT N/A 09/25/2023   Procedure: XI ROBOTIC ASSISTED LAPAROSCOPIC EXTENSIVE LYSIS OF ADHESIONS, PERITONEAL BIOPSIES;  Surgeon: Alvaro Ricardo KATHEE Mickey., MD;  Location: [facility];  Service: Urology;  Laterality: N/A;   SISTRUNK PROCEDURE  03/03/2012   Thyroglossal duct cyst excision   TONSILLECTOMY     TRANSURETHRAL RESECTION OF BLADDER TUMOR N/A 04/04/2023   Procedure: TRANSURETHRAL RESECTION OF BLADDER TUMOR (TURBT);  Surgeon: Penne Knee, MD;  Location: [facility];  Service: Urology;  Laterality: N/A;   TRANSURETHRAL RESECTION OF BLADDER TUMOR N/A 08/12/2024   Procedure: TURBT (TRANSURETHRAL RESECTION OF BLADDER TUMOR);  Surgeon: Alvaro Ricardo KATHEE Mickey., MD;  Location: [facility];  Service: Urology;  Laterality: N/A;    SOCIAL HISTORY: Social History   Socioeconomic History  Marital status: Married    Spouse name: Not on file   Number of children: Not on file   Years of education: Not on file   Highest education level: Not on file  Occupational History   Not on file  Tobacco Use   Smoking status: Former    Current packs/day: 0.25    Average packs/day: 0.3 packs/day for 30.0 years (7.5 ttl pk-yrs)    Types: Cigarettes    Passive exposure: Past   Smokeless tobacco: Never  Vaping Use   Vaping status: Never Used  Substance and Sexual Activity   Alcohol  use: Never   Drug use: Never   Sexual activity: Not on file  Other Topics Concern   Not on file  Social History Narrative   Not on file   Social Drivers of Health   Financial Resource  Strain: Not on file  Food Insecurity: No Food Insecurity (09/25/2023)   Hunger Vital Sign    Worried About Running Out of Food in the Last Year: Never true    Ran Out of Food in the Last Year: Never true  Transportation Needs: No Transportation Needs (09/25/2023)   PRAPARE - Administrator, Civil Service (Medical): No    Lack of Transportation (Non-Medical): No  Physical Activity: Not on file  Stress: Not on file  Social Connections: Not on file  Intimate Partner Violence: Not At Risk (09/25/2023)   Humiliation, Afraid, Rape, and Kick questionnaire    Fear of Current or Ex-Partner: No    Emotionally Abused: No    Physically Abused: No    Sexually Abused: No    FAMILY HISTORY: Family History  Adopted: Yes    ALLERGIES:  is allergic to pseudoephedrine hcl.  MEDICATIONS:  Current Outpatient Medications  Medication Sig Dispense Refill   carboxymethylcellulose (REFRESH PLUS) 0.5 % SOLN Place 1 drop into both eyes 3 (three) times daily as needed (dry eyes).     escitalopram  (LEXAPRO ) 5 MG tablet Take 1 tablet (5 mg total) by mouth daily. (Patient taking differently: Take 5 mg by mouth at bedtime.) 90 tablet 1   estradiol  (ESTRACE ) 1 MG tablet TAKE 1 TABLET BY MOUTH DAILY (Patient taking differently: Take 1 mg by mouth at bedtime.) 90 tablet 3   famotidine  (PEPCID ) 20 MG tablet Take 20 mg by mouth at bedtime.     prochlorperazine  (COMPAZINE ) 10 MG tablet TAKE 1 TABLET BY MOUTH EVERY 6 HOURS AS NEEDED FOR NAUSEA OR VOMITING. (Patient taking differently: Take 10 mg by mouth See admin instructions. Take 1 tablet (10 mg) by mouth scheduled at bedtime & take every 6 hours if needed for nausea/vomiting.) 90 tablet 1   topiramate  (TOPAMAX ) 50 MG tablet TAKE 2 TABLETS BY MOUTH AT  BEDTIME (Patient taking differently: Take 50-100 mg by mouth at bedtime.) 180 tablet 3   traMADol  (ULTRAM ) 50 MG tablet Take 1-2 tablets (50-100 mg total) by mouth every 6 (six) hours as needed for moderate  pain (pain score 4-6) or severe pain (pain score 7-10) (from bladder cancer). 30 tablet 0   levothyroxine  (SYNTHROID ) 50 MCG tablet Take 1 tablet (50 mcg total) by mouth daily before breakfast. Do not eat or drink until 30 mins- 1 hour after taking the pill. (Patient not taking: Reported on 08/26/2024) 30 tablet 3   OLANZapine (ZYPREXA) 5 MG tablet Take one pill at night. (Patient not taking: Reported on 08/26/2024) 30 tablet 3   No current facility-administered medications for this visit.   Facility-Administered  Medications Ordered in Other Visits  Medication Dose Route Frequency Provider Last Rate Last Admin   0.9 %  sodium chloride  infusion   Intravenous Continuous Kenzy Campoverde R, MD 10 mL/hr at 08/26/24 1024 New Bag at 08/26/24 1024   heparin  lock flush 100 unit/mL  500 Units Intravenous Once Agrawal, Kavita, MD       heparin  lock flush 100 unit/mL  500 Units Intravenous Once Agrawal, Kavita, MD       heparin  lock flush 100 unit/mL  500 Units Intravenous Once Agrawal, Kavita, MD       sodium chloride  flush (NS) 0.9 % injection 10 mL  10 mL Intravenous Once Agrawal, Kavita, MD       sodium chloride  flush (NS) 0.9 % injection 10 mL  10 mL Intravenous Once Agrawal, Kavita, MD       sodium chloride  flush (NS) 0.9 % injection 10 mL  10 mL Intravenous Once Agrawal, Kavita, MD        PHYSICAL EXAMINATION:   Vitals:   08/26/24 0901  BP: 116/75  Pulse: 73  Resp: 16  Temp: (!) 95.6 F (35.3 C)  SpO2: 100%    Filed Weights   08/26/24 0901  Weight: 99 lb 8 oz (45.1 kg)     Physical Exam Vitals and nursing note reviewed.  HENT:     Head: Normocephalic and atraumatic.     Mouth/Throat:     Pharynx: Oropharynx is clear.  Eyes:     Extraocular Movements: Extraocular movements intact.     Pupils: Pupils are equal, round, and reactive to light.  Cardiovascular:     Rate and Rhythm: Normal rate and regular rhythm.  Pulmonary:     Comments: Decreased breath sounds bilaterally.   Abdominal:     Palpations: Abdomen is soft.  Musculoskeletal:        General: Normal range of motion.     Cervical back: Normal range of motion.  Skin:    General: Skin is warm.  Neurological:     General: No focal deficit present.     Mental Status: She is alert and oriented to person, place, and time.  Psychiatric:        Behavior: Behavior normal.        Judgment: Judgment normal.     LABORATORY DATA:  I have reviewed the data as listed Lab Results  Component Value Date   WBC 5.7 08/26/2024   HGB 11.6 (L) 08/26/2024   HCT 34.4 (L) 08/26/2024   MCV 96.4 08/26/2024   PLT 211 08/26/2024   Recent Labs    08/04/24 1022 08/10/24 1311 08/11/24 1205 08/26/24 0857  NA 135 137 137 136  K 3.1* 3.4* 3.4* 3.2*  CL 106 103 106 106  CO2 22 25 23 22   GLUCOSE 87 82 81 89  BUN 20 20 24* 18  CREATININE 1.37* 1.33* 1.21* 1.32*  CALCIUM  8.4* 9.3 8.7* 8.5*  GFRNONAA 43* 45* 50* 45*  PROT 7.1  --  7.2 7.1  ALBUMIN 3.4*  --  3.5 3.4*  AST 19  --  19 19  ALT 8  --  8 7  ALKPHOS 49  --  51 56  BILITOT 0.6  --  0.6 0.6    RADIOGRAPHIC STUDIES: I have personally reviewed the radiological images as listed and agreed with the findings in the report. DG C-Arm 1-60 Min-No Report Result Date: 08/12/2024 Fluoroscopy was utilized by the requesting physician.  No radiographic interpretation.  Malignant neoplasm of urinary bladder [facility]) # Stage/metastatic urothelial cancer; Tempus testing was sent on peritoneal biopsies - no targets currently on Pembro+ Erfortumab-   # PET July 16th, 2025-. Compared with previous PET-CT, generally improved hypermetabolic retroperitoneal and pelvic lymph nodes, consistent with response to treatment. There is a single right pelvic sidewall node which has slightly enlarged compared with the most recent CT and demonstrates mild hypermetabolic activity, suspicious for residual metastatic disease;  New small presacral nodule with mild hypermetabolic  activity, suspicious for metastatic disease;  No evidence of metastatic disease in the chest.   #  Currently on proceed with Pem+Enfor 1mg /kg- day1 today. Labs-CBC/chemistries were reviewed with the patient.  Will order PET scan after this cycle.  Ordered today.  # Renal/Hypokalemia- -Potassium 3.2 .   supp- potassium rich food.  Continue monitoring.  CKD stage III - Increase fluid intake- stable.  # Elevated TSH but normal- T4- discussed with patient- will monitor for now.  Hypothyroidism sec to jerome- [AUG 2025]-RECOMMEND  COMPLAINCE with Synthroid  50 mcg/day-    # Weight loss: -Continue to follow with nutrition. Recommend starting Zyprexa-    # Maculopapular rash: Likely secondary to enfortumab vedotin - Initially, started with grade 3 involving more than 30% BSA with severe pruritus, mild fever.  Significantly improved after dose reduction. Continue with supportive care-Claritin, Aveeno moisturizer and Singulair ; Kenalog  0.5% twice daily as needed to the affected areas- stable.  # Mild lower abdominal pain- sec to malignancy- recommend tramadol  as needed. Stable.    # Hematuria: [Dr. 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  Continue follow-up with Dr. Alvaro- again in 6-9 months.     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  Order Thyroid  profile in OCT  PS- # DISPOSITION: # PET scan in 3 weeks- ordered # chemo today-  # as per IS- in 1 week-chemo/labs- # as per IS in 4 weeks- MD; labs-;chemo; in 5 weeks- labs- chemo Dr.B  Above plan of care was discussed with patient/family in detail.  My contact information was given to the patient/family.      Cindy JONELLE Joe, MD 08/26/2024  1:07 PM

## 2024-08-26 NOTE — Progress Notes (Signed)
 Stent replacement, removed some of the tumor, 08/12/24 Barbara [name].  She is having more sadness right now due to losing her dog.

## 2024-08-27 ENCOUNTER — Encounter: Payer: Self-pay | Admitting: Internal Medicine

## 2024-08-27 ENCOUNTER — Telehealth: Payer: Self-pay

## 2024-08-27 NOTE — Telephone Encounter (Signed)
 Patient's mother in law, Gaetana, called with concerns of patient's wt and nutrition.  Gaetana is not on DPR or contact list for patient.  Called patient to let her know that Gaetana called the office but we are not able to discuss medical care with her.    Patient states that she saw MD yesterday and she is fine.  Patient will call Gaetana to discuss her care.

## 2024-09-01 ENCOUNTER — Inpatient Hospital Stay

## 2024-09-01 ENCOUNTER — Ambulatory Visit

## 2024-09-01 VITALS — BP 100/67 | HR 69 | Temp 96.2°F | Resp 19 | Wt 100.6 lb

## 2024-09-01 DIAGNOSIS — C679 Malignant neoplasm of bladder, unspecified: Secondary | ICD-10-CM

## 2024-09-01 DIAGNOSIS — Z5112 Encounter for antineoplastic immunotherapy: Secondary | ICD-10-CM | POA: Diagnosis not present

## 2024-09-01 LAB — CMP (CANCER CENTER ONLY)
ALT: 8 U/L (ref 0–44)
AST: 21 U/L (ref 15–41)
Albumin: 3.4 g/dL — ABNORMAL LOW (ref 3.5–5.0)
Alkaline Phosphatase: 52 U/L (ref 38–126)
Anion gap: 8 (ref 5–15)
BUN: 19 mg/dL (ref 8–23)
CO2: 22 mmol/L (ref 22–32)
Calcium: 8.4 mg/dL — ABNORMAL LOW (ref 8.9–10.3)
Chloride: 105 mmol/L (ref 98–111)
Creatinine: 1.36 mg/dL — ABNORMAL HIGH (ref 0.44–1.00)
GFR, Estimated: 44 mL/min — ABNORMAL LOW (ref >60)
Glucose, Bld: 103 mg/dL — ABNORMAL HIGH (ref 70–99)
Potassium: 3.2 mmol/L — ABNORMAL LOW (ref 3.5–5.1)
Sodium: 135 mmol/L (ref 135–145)
Total Bilirubin: 0.8 mg/dL (ref 0.0–1.2)
Total Protein: 7.1 g/dL (ref 6.5–8.1)

## 2024-09-01 LAB — CBC WITH DIFFERENTIAL (CANCER CENTER ONLY)
Abs Immature Granulocytes: 0.02 K/uL (ref 0.00–0.07)
Basophils Absolute: 0 K/uL (ref 0.0–0.1)
Basophils Relative: 0 %
Eosinophils Absolute: 0.5 K/uL (ref 0.0–0.5)
Eosinophils Relative: 8 %
HCT: 35.4 % — ABNORMAL LOW (ref 36.0–46.0)
Hemoglobin: 11.9 g/dL — ABNORMAL LOW (ref 12.0–15.0)
Immature Granulocytes: 0 %
Lymphocytes Relative: 24 %
Lymphs Abs: 1.5 K/uL (ref 0.7–4.0)
MCH: 32.3 pg (ref 26.0–34.0)
MCHC: 33.6 g/dL (ref 30.0–36.0)
MCV: 96.2 fL (ref 80.0–100.0)
Monocytes Absolute: 0.5 K/uL (ref 0.1–1.0)
Monocytes Relative: 8 %
Neutro Abs: 3.7 K/uL (ref 1.7–7.7)
Neutrophils Relative %: 60 %
Platelet Count: 200 K/uL (ref 150–400)
RBC: 3.68 MIL/uL — ABNORMAL LOW (ref 3.87–5.11)
RDW: 14.4 % (ref 11.5–15.5)
WBC Count: 6.2 K/uL (ref 4.0–10.5)
nRBC: 0 % (ref 0.0–0.2)

## 2024-09-01 MED ORDER — SODIUM CHLORIDE 0.9 % IV SOLN
INTRAVENOUS | Status: DC
  Filled 2024-09-01: qty 250

## 2024-09-01 MED ORDER — SODIUM CHLORIDE 0.9 % IV SOLN
1.0000 mg/kg | Freq: Once | INTRAVENOUS | Status: AC
  Administered 2024-09-01: 50 mg via INTRAVENOUS
  Filled 2024-09-01: qty 3

## 2024-09-01 MED ORDER — PROCHLORPERAZINE MALEATE 10 MG PO TABS
10.0000 mg | ORAL_TABLET | Freq: Once | ORAL | Status: AC
  Administered 2024-09-01: 10 mg via ORAL
  Filled 2024-09-01: qty 1

## 2024-09-15 ENCOUNTER — Ambulatory Visit
Admission: RE | Admit: 2024-09-15 | Discharge: 2024-09-15 | Disposition: A | Discharge status: Home / Self Care | Source: Ambulatory Visit | Attending: Internal Medicine | Admitting: Internal Medicine

## 2024-09-15 DIAGNOSIS — C775 Secondary and unspecified malignant neoplasm of intrapelvic lymph nodes: Secondary | ICD-10-CM | POA: Insufficient documentation

## 2024-09-15 DIAGNOSIS — K8020 Calculus of gallbladder without cholecystitis without obstruction: Secondary | ICD-10-CM | POA: Insufficient documentation

## 2024-09-15 DIAGNOSIS — I700 Atherosclerosis of aorta: Secondary | ICD-10-CM | POA: Diagnosis not present

## 2024-09-15 DIAGNOSIS — I2510 Atherosclerotic heart disease of native coronary artery without angina pectoris: Secondary | ICD-10-CM | POA: Diagnosis not present

## 2024-09-15 DIAGNOSIS — C679 Malignant neoplasm of bladder, unspecified: Secondary | ICD-10-CM | POA: Insufficient documentation

## 2024-09-15 LAB — GLUCOSE, CAPILLARY: Glucose-Capillary: 80 mg/dL (ref 70–99)

## 2024-09-15 MED ORDER — FLUDEOXYGLUCOSE F - 18 (FDG) INJECTION
5.2000 | Freq: Once | INTRAVENOUS | Status: AC | PRN
  Administered 2024-09-15: 5.29 via INTRAVENOUS

## 2024-09-16 ENCOUNTER — Other Ambulatory Visit: Payer: Self-pay | Admitting: Internal Medicine

## 2024-09-16 DIAGNOSIS — C679 Malignant neoplasm of bladder, unspecified: Secondary | ICD-10-CM

## 2024-09-23 ENCOUNTER — Encounter: Payer: Self-pay | Admitting: Internal Medicine

## 2024-09-23 ENCOUNTER — Inpatient Hospital Stay: Attending: Internal Medicine

## 2024-09-23 ENCOUNTER — Inpatient Hospital Stay

## 2024-09-23 ENCOUNTER — Inpatient Hospital Stay: Attending: Internal Medicine | Admitting: Internal Medicine

## 2024-09-23 VITALS — BP 133/76 | HR 90 | Temp 96.9°F | Resp 16 | Ht 65.0 in | Wt 99.3 lb

## 2024-09-23 VITALS — BP 101/59 | HR 72 | Temp 97.5°F | Resp 18

## 2024-09-23 DIAGNOSIS — Z5112 Encounter for antineoplastic immunotherapy: Secondary | ICD-10-CM | POA: Diagnosis present

## 2024-09-23 DIAGNOSIS — C679 Malignant neoplasm of bladder, unspecified: Secondary | ICD-10-CM | POA: Diagnosis not present

## 2024-09-23 DIAGNOSIS — Z79899 Other long term (current) drug therapy: Secondary | ICD-10-CM | POA: Insufficient documentation

## 2024-09-23 DIAGNOSIS — C775 Secondary and unspecified malignant neoplasm of intrapelvic lymph nodes: Secondary | ICD-10-CM | POA: Insufficient documentation

## 2024-09-23 LAB — CBC WITH DIFFERENTIAL (CANCER CENTER ONLY)
Abs Immature Granulocytes: 0.03 K/uL (ref 0.00–0.07)
Basophils Absolute: 0 K/uL (ref 0.0–0.1)
Basophils Relative: 1 %
Eosinophils Absolute: 0.4 K/uL (ref 0.0–0.5)
Eosinophils Relative: 8 %
HCT: 33.7 % — ABNORMAL LOW (ref 36.0–46.0)
Hemoglobin: 11.5 g/dL — ABNORMAL LOW (ref 12.0–15.0)
Immature Granulocytes: 1 %
Lymphocytes Relative: 36 %
Lymphs Abs: 1.9 K/uL (ref 0.7–4.0)
MCH: 33 pg (ref 26.0–34.0)
MCHC: 34.1 g/dL (ref 30.0–36.0)
MCV: 96.8 fL (ref 80.0–100.0)
Monocytes Absolute: 0.6 K/uL (ref 0.1–1.0)
Monocytes Relative: 10 %
Neutro Abs: 2.4 K/uL (ref 1.7–7.7)
Neutrophils Relative %: 44 %
Platelet Count: 191 K/uL (ref 150–400)
RBC: 3.48 MIL/uL — ABNORMAL LOW (ref 3.87–5.11)
RDW: 15.1 % (ref 11.5–15.5)
WBC Count: 5.4 K/uL (ref 4.0–10.5)
nRBC: 0 % (ref 0.0–0.2)

## 2024-09-23 LAB — CMP (CANCER CENTER ONLY)
ALT: 6 U/L (ref 0–44)
AST: 22 U/L (ref 15–41)
Albumin: 3.7 g/dL (ref 3.5–5.0)
Alkaline Phosphatase: 60 U/L (ref 38–126)
Anion gap: 10 (ref 5–15)
BUN: 15 mg/dL (ref 8–23)
CO2: 25 mmol/L (ref 22–32)
Calcium: 9.2 mg/dL (ref 8.9–10.3)
Chloride: 106 mmol/L (ref 98–111)
Creatinine: 1.47 mg/dL — ABNORMAL HIGH (ref 0.44–1.00)
GFR, Estimated: 40 mL/min — ABNORMAL LOW (ref >60)
Glucose, Bld: 89 mg/dL (ref 70–99)
Potassium: 3.5 mmol/L (ref 3.5–5.1)
Sodium: 141 mmol/L (ref 135–145)
Total Bilirubin: 0.4 mg/dL (ref 0.0–1.2)
Total Protein: 6.9 g/dL (ref 6.5–8.1)

## 2024-09-23 MED ORDER — SODIUM CHLORIDE 0.9 % IV SOLN
1.0000 mg/kg | Freq: Once | INTRAVENOUS | Status: AC
  Administered 2024-09-23: 50 mg via INTRAVENOUS
  Filled 2024-09-23: qty 3

## 2024-09-23 MED ORDER — SODIUM CHLORIDE 0.9 % IV SOLN
INTRAVENOUS | Status: DC
  Filled 2024-09-23: qty 250

## 2024-09-23 MED ORDER — SODIUM CHLORIDE 0.9 % IV SOLN
200.0000 mg | Freq: Once | INTRAVENOUS | Status: AC
  Administered 2024-09-23: 200 mg via INTRAVENOUS
  Filled 2024-09-23: qty 8

## 2024-09-23 MED ORDER — PROCHLORPERAZINE MALEATE 10 MG PO TABS
10.0000 mg | ORAL_TABLET | Freq: Once | ORAL | Status: AC
  Administered 2024-09-23: 10 mg via ORAL
  Filled 2024-09-23: qty 1

## 2024-09-23 NOTE — Progress Notes (Signed)
 Keep padcev  dose at 50mg  rounded per Dr Onnie

## 2024-09-23 NOTE — Progress Notes (Signed)
 PET 09/15/24.

## 2024-09-23 NOTE — Progress Notes (Signed)
 [facility] CONSULT NOTE  Patient Care Team: McDonough, Tinnie Barbara [name] as PCP - General (Physician Assistant) Rennie Cindy SAUNDERS, MD as Consulting Physician (Oncology)  CHIEF COMPLAINTS/PURPOSE OF CONSULTATION: Bladder cancer  Oncology History Overview Note  # Metastatic urothelial carcinoma - Patient developed urinary symptoms around September or October 2023.  Was treated with multiple rounds of antibiotics with no improvement.  She was then referred to Dr. Penne for further evaluation.   - s/p TURBT with left ureteral stent placement by Dr. Penne.  IntraOp findings showed large irregular mucosal submucosal extensive tumor from the left dome, left lateral bladder wall majority of trigone including the left UO and bladder neck.  Measures at least 5 x 5 cm. Pathology showed invasive high-grade urothelial carcinoma. Invades muscularis propria.   - Completed neoadjuvant cycle 4-day 1 of split dose cisplatin  and gemcitabine  (07/23/2023). Pt declined C4D8 due to poor tolerance/nausea/flank pain.  Split dose cisplatin  considered for CKD stage III.   - s/p robotic adhesiolysis + peritoneal biopsies 09/25/23 showed urothelial cancer with peritoneal carcinomatosis and small + large bowel serosal involvement. Original plan was cystectomy and urinary diversion but this was aborted based on intra-op findings.   # JAN 14th, 2025- Start Pem+Erfo   Urothelial cancer (HCC)  04/12/2023 Initial Diagnosis   Urothelial cancer (HCC)   04/12/2023 Cancer Staging   Staging form: Kidney, AJCC 8th Edition - Clinical: Stage III (cT3, cN0, cM0) - Signed by Agrawal, Kavita, MD on 05/07/2023 Stage prefix: Initial diagnosis   09/25/2023 Cancer Staging   Staging form: Kidney, AJCC 8th Edition - Pathologic stage from 09/25/2023: Stage IV (pM1) - Signed by Clista Bimler, MD on 10/25/2023   Malignant neoplasm of urinary bladder (HCC)  04/22/2023 Initial Diagnosis   Malignant neoplasm of urinary bladder  (HCC)   05/07/2023 - 07/23/2023 Chemotherapy   Patient is on Treatment Plan : BLADDER Gemcitabine  D1,8 + Cisplatin  (split dose) D1,8 q21d x 4 cycles     11/05/2023 -  Chemotherapy   Patient is on Treatment Plan : UROTHELIAL ADVANCED, METASTATIC ENFORTUMAB D1, D8 + PEMBROLIZUMAB  (200) D1 Q21D       Patient ambulating-independently.   Alone.   # 63 year old female patient with a history of metastatic urothelial carcinoma currently on pembrolizumab  plus Enfor is here for follow-up.   Discussed the use of AI scribe software for clinical note transcription with the patient, who gave verbal consent to proceed.  History of Present Illness   Barbara [name] is a 63 year old female with urethral carcinoma who presents for follow-up.  She has been undergoing treatment for urethral carcinoma for nearly a year, having started on January 14th. Her current regimen includes chemotherapy and immunotherapy. Recent PET scan results show a slight increase in size of a lymph node in the pelvis, from 9 mm to 11 mm, but no new lymph nodes in the lungs or other areas. There are no spots in the bones, liver, spleen, or pancreas.  No worsening of a rash and she has not been taking olanzapine  prescribed for appetite and nausea due to concerns about side effects. Her weight is stable, and her reflux is 'good'.  She has recently restarted thyroid  medication, and her blood pressure and weight are stable. There is a mention of calcium  buildup in her heart valves, but she has not seen a cardiologist or had an echocardiogram. No swelling in her legs.          Review of Systems  Constitutional:  Positive for malaise/fatigue. Negative for chills, diaphoresis, fever and weight loss.  HENT:  Negative for nosebleeds and sore throat.   Eyes:  Negative for double vision.  Respiratory:  Negative for cough, hemoptysis, sputum production, shortness of breath and wheezing.   Cardiovascular:  Negative for chest pain,  palpitations, orthopnea and leg swelling.  Gastrointestinal:  Negative for abdominal pain, blood in stool, constipation, diarrhea, heartburn, melena, nausea and vomiting.  Genitourinary:  Negative for dysuria, frequency and urgency.  Musculoskeletal:  Negative for back pain and joint pain.  Skin:  Positive for itching and rash.  Neurological:  Negative for dizziness, tingling, focal weakness, weakness and headaches.  Endo/Heme/Allergies:  Does not bruise/bleed easily.  Psychiatric/Behavioral:  Negative for depression. The patient is not nervous/anxious and does not have insomnia.     MEDICAL HISTORY:  Past Medical History:  Diagnosis Date   Adopted    Allergic rhinitis    Anemia    h/o   Anxiety    Arthritis    B12 deficiency    Bilateral carotid artery stenosis    Bilateral carotid bruits    Bladder tumor    Cancer (HCC)    bladder cancer   Depression    Gallbladder polyp    GERD (gastroesophageal reflux disease)    Heart murmur    Hydronephrosis of left kidney    Hypothyroidism    Migraines    Tobacco abuse    UTI (urinary tract infection)     SURGICAL HISTORY: Past Surgical History:  Procedure Laterality Date   ABDOMINAL HYSTERECTOMY     BLADDER SURGERY     BREAST BIOPSY Left 2014   neg   CYSTOSCOPY W/ RETROGRADES Bilateral 01/29/2024   Procedure: CYSTOSCOPY, WITH RETROGRADE PYELOGRAM;  Surgeon: Alvaro Ricardo KATHEE Mickey., MD;  Location: [facility];  Service: Urology;  Laterality: Bilateral;   CYSTOSCOPY W/ RETROGRADES Left 08/12/2024   Procedure: CYSTOSCOPY, WITH RETROGRADE PYELOGRAM;  Surgeon: Alvaro Ricardo KATHEE Mickey., MD;  Location: [facility];  Service: Urology;  Laterality: Left;   CYSTOSCOPY W/ URETERAL STENT PLACEMENT Left 04/04/2023   Procedure: CYSTOSCOPY WITH RETROGRADE PYELOGRAM/URETERAL STENT PLACEMENT;  Surgeon: Penne Knee, MD;  Location: [facility];  Service: Urology;  Laterality: Left;   CYSTOSCOPY W/ URETERAL STENT PLACEMENT Left 08/12/2024   Procedure: STENT  EXCHANGE;  Surgeon: Alvaro Ricardo KATHEE Mickey., MD;  Location: [facility];  Service: Urology;  Laterality: Left;   CYSTOSCOPY WITH STENT PLACEMENT Left 01/29/2024   Procedure: CYSTOSCOPY, WITH STENT INSERTION;  Surgeon: Alvaro Ricardo KATHEE Mickey., MD;  Location: [facility];  Service: Urology;  Laterality: Left;   FOOT SURGERY Right    2009   IR IMAGING GUIDED PORT INSERTION  05/03/2023   LAPAROSCOPIC APPENDECTOMY  12/05/2012   NASAL SEPTUM SURGERY     ROBOT ASSISTED LAPAROSCOPIC COMPLETE CYSTECT ILEAL CONDUIT N/A 09/25/2023   Procedure: XI ROBOTIC ASSISTED LAPAROSCOPIC EXTENSIVE LYSIS OF ADHESIONS, PERITONEAL BIOPSIES;  Surgeon: Alvaro Ricardo KATHEE Mickey., MD;  Location: [facility];  Service: Urology;  Laterality: N/A;   SISTRUNK PROCEDURE  03/03/2012   Thyroglossal duct cyst excision   TONSILLECTOMY     TRANSURETHRAL RESECTION OF BLADDER TUMOR N/A 04/04/2023   Procedure: TRANSURETHRAL RESECTION OF BLADDER TUMOR (TURBT);  Surgeon: Penne Knee, MD;  Location: [facility];  Service: Urology;  Laterality: N/A;   TRANSURETHRAL RESECTION OF BLADDER TUMOR N/A 08/12/2024   Procedure: TURBT (TRANSURETHRAL RESECTION OF BLADDER TUMOR);  Surgeon: Alvaro Ricardo KATHEE Mickey., MD;  Location: [facility];  Service: Urology;  Laterality:  N/A;    SOCIAL HISTORY: Social History   Socioeconomic History   Marital status: Married    Spouse name: Not on file   Number of children: Not on file   Years of education: Not on file   Highest education level: Not on file  Occupational History   Not on file  Tobacco Use   Smoking status: Former    Current packs/day: 0.25    Average packs/day: 0.3 packs/day for 30.0 years (7.5 ttl pk-yrs)    Types: Cigarettes    Passive exposure: Past   Smokeless tobacco: Never  Vaping Use   Vaping status: Never Used  Substance and Sexual Activity   Alcohol  use: Never   Drug use: Never   Sexual activity: Not on file  Other Topics Concern   Not on file  Social History Narrative   Not on file   Social  Drivers of Health   Financial Resource Strain: Not on file  Food Insecurity: No Food Insecurity (09/25/2023)   Hunger Vital Sign    Worried About Running Out of Food in the Last Year: Never true    Ran Out of Food in the Last Year: Never true  Transportation Needs: No Transportation Needs (09/25/2023)   PRAPARE - Administrator, Civil Service (Medical): No    Lack of Transportation (Non-Medical): No  Physical Activity: Not on file  Stress: Not on file  Social Connections: Not on file  Intimate Partner Violence: Not At Risk (09/25/2023)   Humiliation, Afraid, Rape, and Kick questionnaire    Fear of Current or Ex-Partner: No    Emotionally Abused: No    Physically Abused: No    Sexually Abused: No    FAMILY HISTORY: Family History  Adopted: Yes    ALLERGIES:  is allergic to pseudoephedrine hcl.  MEDICATIONS:  Current Outpatient Medications  Medication Sig Dispense Refill   carboxymethylcellulose (REFRESH PLUS) 0.5 % SOLN Place 1 drop into both eyes 3 (three) times daily as needed (dry eyes).     escitalopram  (LEXAPRO ) 5 MG tablet Take 1 tablet (5 mg total) by mouth daily. (Patient taking differently: Take 5 mg by mouth at bedtime.) 90 tablet 1   estradiol  (ESTRACE ) 1 MG tablet TAKE 1 TABLET BY MOUTH DAILY (Patient taking differently: Take 1 mg by mouth at bedtime.) 90 tablet 3   famotidine  (PEPCID ) 20 MG tablet Take 20 mg by mouth at bedtime.     levothyroxine  (SYNTHROID ) 50 MCG tablet Take 1 tablet (50 mcg total) by mouth daily before breakfast. Do not eat or drink until 30 mins- 1 hour after taking the pill. 30 tablet 3   OLANZapine  (ZYPREXA ) 5 MG tablet TAKE 1 TABLET BY MOUTH AT NIGHT 90 tablet 2   prochlorperazine  (COMPAZINE ) 10 MG tablet TAKE 1 TABLET BY MOUTH EVERY 6 HOURS AS NEEDED FOR NAUSEA OR VOMITING. 90 tablet 1   topiramate  (TOPAMAX ) 50 MG tablet TAKE 2 TABLETS BY MOUTH AT  BEDTIME (Patient taking differently: Take 50-100 mg by mouth at bedtime.) 180 tablet 3    traMADol  (ULTRAM ) 50 MG tablet Take 1-2 tablets (50-100 mg total) by mouth every 6 (six) hours as needed for moderate pain (pain score 4-6) or severe pain (pain score 7-10) (from bladder cancer). 30 tablet 0   No current facility-administered medications for this visit.   Facility-Administered Medications Ordered in Other Visits  Medication Dose Route Frequency Provider Last Rate Last Admin   0.9 %  sodium chloride  infusion   Intravenous  Continuous Amiree No R, MD       enfortumab vedotin -ejfv (PADCEV ) 50 mg in sodium chloride  0.9 % 50 mL (0.9091 mg/mL) chemo infusion  1 mg/kg (Treatment Plan Recorded) Intravenous Once Liah Morr R, MD       heparin  lock flush 100 unit/mL  500 Units Intravenous Once Agrawal, Kavita, MD       heparin  lock flush 100 unit/mL  500 Units Intravenous Once Agrawal, Kavita, MD       heparin  lock flush 100 unit/mL  500 Units Intravenous Once Agrawal, Kavita, MD       pembrolizumab  (KEYTRUDA ) 200 mg in sodium chloride  0.9 % 50 mL chemo infusion  200 mg Intravenous Once Jeniyah Menor R, MD       prochlorperazine  (COMPAZINE ) tablet 10 mg  10 mg Oral Once Darcia Lampi R, MD       sodium chloride  flush (NS) 0.9 % injection 10 mL  10 mL Intravenous Once Agrawal, Kavita, MD       sodium chloride  flush (NS) 0.9 % injection 10 mL  10 mL Intravenous Once Agrawal, Kavita, MD       sodium chloride  flush (NS) 0.9 % injection 10 mL  10 mL Intravenous Once Agrawal, Kavita, MD        PHYSICAL EXAMINATION:   Vitals:   09/23/24 0851  BP: 133/76  Pulse: 90  Resp: 16  Temp: (!) 96.9 F (36.1 C)  SpO2: 95%    Filed Weights   09/23/24 0851  Weight: 99 lb 4.8 oz (45 kg)     Physical Exam Vitals and nursing note reviewed.  HENT:     Head: Normocephalic and atraumatic.     Mouth/Throat:     Pharynx: Oropharynx is clear.  Eyes:     Extraocular Movements: Extraocular movements intact.     Pupils: Pupils are equal, round, and reactive to  light.  Cardiovascular:     Rate and Rhythm: Normal rate and regular rhythm.  Pulmonary:     Comments: Decreased breath sounds bilaterally.  Abdominal:     Palpations: Abdomen is soft.  Musculoskeletal:        General: Normal range of motion.     Cervical back: Normal range of motion.  Skin:    General: Skin is warm.  Neurological:     General: No focal deficit present.     Mental Status: She is alert and oriented to person, place, and time.  Psychiatric:        Behavior: Behavior normal.        Judgment: Judgment normal.     LABORATORY DATA:  I have reviewed the data as listed Lab Results  Component Value Date   WBC 5.4 09/23/2024   HGB 11.5 (L) 09/23/2024   HCT 33.7 (L) 09/23/2024   MCV 96.8 09/23/2024   PLT 191 09/23/2024   Recent Labs    08/26/24 0857 09/01/24 1226 09/23/24 0843  NA 136 135 141  K 3.2* 3.2* 3.5  CL 106 105 106  CO2 22 22 25   GLUCOSE 89 103* 89  BUN 18 19 15   CREATININE 1.32* 1.36* 1.47*  CALCIUM  8.5* 8.4* 9.2  GFRNONAA 45* 44* 40*  PROT 7.1 7.1 6.9  ALBUMIN 3.4* 3.4* 3.7  AST 19 21 22   ALT 7 8 6   ALKPHOS 56 52 60  BILITOT 0.6 0.8 0.4    RADIOGRAPHIC STUDIES: I have personally reviewed the radiological images as listed and agreed with the findings in the report. NM PET  Image Restage (PS) Skull Base to Thigh (F-18 FDG) Result Date: 09/22/2024 EXAM: PET AND CT SKULL BASE TO MID THIGH 09/15/2024 11:00:47 AM TECHNIQUE: RADIOPHARMACEUTICAL: 5.29 mCi F-18 FDG. Uptake time 60 minutes. Glucose level 80 mg/dl. Blood pool SUV 2.3. PET imaging was acquired from the base of the skull to the mid thighs. Non-contrast enhanced computed tomography was obtained for attenuation correction and anatomic localization. COMPARISON: 05/06/2024 CLINICAL HISTORY: bladder cancer FINDINGS: HEAD AND NECK: No hypermetabolic cervical lymph nodes are identified. CHEST: Mediastinal nodes demonstrate low level activity. Example right paratracheal node at 6 mm and SUV 2.4  versus similar in size and SUV 2.3 on the prior exam. Likely reactive. There are no hypermetabolic hilar or axillary lymph nodes. No hypermetabolic pulmonary activity or suspicious nodularity. Right port-a-cath tip at superior cavoatrial junction. Aortic and coronary artery calcification. Aortic valve calcification. Centrilobular emphysema. ABDOMEN AND PELVIS: There is no hypermetabolic activity within the liver, adrenal glands, spleen or pancreas. Bilateral hypermetabolic pelvic adenopathy. Example right external iliac node at 11 mm and SUV 4.5 on image 123/6 compared to 9 mm and SUV 3.9 on the prior exam. Left external iliac node measures 12 mm and SUV 3.8 on image 126/6 versus 9 mm and SUV 2.7 on the prior exam (when measured). New presacral nodularity and mild hypermetabolism. Example at 5 mm and suv 3.4 on image 131 / 6. left sided ureteric stent with mild left renal cortical thinning. Eccentric left bladder wall thickening is relatively similar, suboptimally evaluated secondary to underdistention. Abdominal aortic atherosclerosis. Dependent 1.0 cm gallstone. Physiologic activity within the gastrointestinal and genitourinary systems. BONES AND SOFT TISSUE: There is no hypermetabolic activity to suggest osseous metastatic disease. IMPRESSION: 1. Mild progression of pelvic nodal metastasis. 2. No extrapelvic metastatic disease identified. 3. Age advanced coronary artery atherosclerosis. Consider risk assessment and medical therapy. 4. Incidental findings, including: Aortic atherosclerosis (ICD10-I70.0) and emphysema (ICD10-J43.9). Cholelithiasis. Aortic valvular calcifications. Consider echocardiography to evaluate for valvular dysfunction. Electronically signed by: Rockey Kilts MD 09/22/2024 08:44 AM EST RP Workstation: [id number]      Malignant neoplasm of urinary bladder (HCC) # Stage/metastatic urothelial cancer; [facility] testing was sent on peritoneal biopsies - no targets currently on Pembro+ Erfortumab-    # PET NOV 21- 2025-.  Mild progression of pelvic nodal metastasis;  No extrapelvic metastatic disease identified.   #  Currently on proceed with Pem+Enfor 1mg /kg- day1 today. Labs-CBC/chemistries were reviewed with the patient.    # Renal/Hypokalemia- -Potassium 3.2 .   supp- potassium rich food.  Continue monitoring.  CKD stage III - Increase fluid intake- stable.  # Elevated TSH but normal- T4- discussed with patient- will monitor for now.  Hypothyroidism sec to jerome- [AUG 2025]-Continue with Synthroid  50 mcg/day-    # Weight loss: -Continue to follow with nutrition. Declines Zyprexa -  stable.   # Maculopapular rash: Likely secondary to enfortumab vedotin - Initially, started with grade 3 involving more than 30% BSA with severe pruritus, mild fever.  Significantly improved after dose reduction. Continue with supportive care-Claritin, Aveeno moisturizer and Singulair ; Kenalog  0.5% twice daily as needed to the affected areas- stable.  # Mild lower abdominal pain- sec to malignancy- recommend tramadol  as needed. Stable.    # Hematuria: [Dr. 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  Continue follow-up with Dr. Alvaro- again in 6-9 months.  stable.   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   #Incidental findings on Imaging  CT , 2025: Age advanced coronary artery atherosclerosis;  Incidental findings, including: Aortic atherosclerosis (ICD10-I70.0) and emphysema (ICD10-J43.9). Cholelithiasis. Aortic valvular calcifications. Consider echocardiography to evaluate for valvular dysfunction- reluctant. I reviewed/discussed/counseled the patient.   PS- # DISPOSITION: # chemo today-  # as per  IS- in 1 week-chemo/labs- # as per IS  on 12/29 weeks- MD; labs-cbc/cmp; Thyroid  profile-;chemo; on 1/05- labs- chemo Dr.B  # I reviewed the blood work- with the patient in detail; also reviewed the imaging independently [as summarized above]; and with the patient in detail.   Above plan of care was discussed with patient/family in detail.  My contact information was given to the patient/family.      Cindy JONELLE Joe, MD 09/23/2024 9:37 AM

## 2024-09-23 NOTE — Assessment & Plan Note (Addendum)
#   Stage/metastatic urothelial cancer; [facility] testing was sent on peritoneal biopsies - no targets currently on Pembro+ Erfortumab-   # PET NOV 21- 2025-.  Mild progression of pelvic nodal metastasis;  No extrapelvic metastatic disease identified.   #  Currently on proceed with Pem+Enfor 1mg /kg- day1 today. Labs-CBC/chemistries were reviewed with the patient.    # Renal/Hypokalemia- -Potassium 3.2 .   supp- potassium rich food.  Continue monitoring.  CKD stage III - Increase fluid intake- stable.  # Elevated TSH but normal- T4- discussed with patient- will monitor for now.  Hypothyroidism sec to jerome- [AUG 2025]-Continue with Synthroid  50 mcg/day-    # Weight loss: -Continue to follow with nutrition. Declines Zyprexa -  stable.   # Maculopapular rash: Likely secondary to enfortumab vedotin - Initially, started with grade 3 involving more than 30% BSA with severe pruritus, mild fever.  Significantly improved after dose reduction. Continue with supportive care-Claritin, Aveeno moisturizer and Singulair ; Kenalog  0.5% twice daily as needed to the affected areas- stable.  # Mild lower abdominal pain- sec to malignancy- recommend tramadol  as needed. Stable.    # Hematuria: [Dr. 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  Continue follow-up with Dr. Alvaro- again in 6-9 months.   stable.   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   #Incidental findings on Imaging  CT , 2025: Age advanced coronary artery atherosclerosis;  Incidental findings, including: Aortic atherosclerosis (ICD10-I70.0) and emphysema (ICD10-J43.9). Cholelithiasis. Aortic valvular calcifications. Consider echocardiography to evaluate  for valvular dysfunction- reluctant. I reviewed/discussed/counseled the patient.   PS- # DISPOSITION: # chemo today-  # as per IS- in 1 week-chemo/labs- # as per IS  on 12/29 weeks- MD; labs-cbc/cmp; Thyroid  profile-;chemo; on 1/05- labs- chemo Dr.B  # I reviewed the blood work- with the patient in detail; also reviewed the imaging independently [as summarized above]; and with the patient in detail.

## 2024-09-23 NOTE — Patient Instructions (Signed)
 [facility] - A DEPT OF [facility]  Discharge Instructions: Thank you for choosing [facility] to provide your oncology and hematology care.  If you have a lab appointment with the Cancer Center, please go directly to the Cancer Center and check in at the registration area.  Wear comfortable clothing and clothing appropriate for easy access to any Portacath or PICC line.   We strive to give you quality time with your provider. You may need to reschedule your appointment if you arrive late (15 or more minutes).  Arriving late affects you and other patients whose appointments are after yours.  Also, if you miss three or more appointments without notifying the office, you may be dismissed from the clinic at the provider's discretion.      For prescription refill requests, have your pharmacy contact our office and allow 72 hours for refills to be completed.    Today you received the following chemotherapy and/or immunotherapy agents Padcev , Keytruda       To help prevent nausea and vomiting after your treatment, we encourage you to take your nausea medication as directed.  BELOW ARE SYMPTOMS THAT SHOULD BE REPORTED IMMEDIATELY: *FEVER GREATER THAN 100.4 F (38 C) OR HIGHER *CHILLS OR SWEATING *NAUSEA AND VOMITING THAT IS NOT CONTROLLED WITH YOUR NAUSEA MEDICATION *UNUSUAL SHORTNESS OF BREATH *UNUSUAL BRUISING OR BLEEDING *URINARY PROBLEMS (pain or burning when urinating, or frequent urination) *BOWEL PROBLEMS (unusual diarrhea, constipation, pain near the anus) TENDERNESS IN MOUTH AND THROAT WITH OR WITHOUT PRESENCE OF ULCERS (sore throat, sores in mouth, or a toothache) UNUSUAL RASH, SWELLING OR PAIN  UNUSUAL VAGINAL DISCHARGE OR ITCHING   Items with * indicate a potential emergency and should be followed up as soon as possible or go to the Emergency Department if any problems should occur.  Please show the CHEMOTHERAPY ALERT CARD or  IMMUNOTHERAPY ALERT CARD at check-in to the Emergency Department and triage nurse.  Should you have questions after your visit or need to cancel or reschedule your appointment, please contact [facility] - A DEPT OF [facility]  [phone] and follow the prompts.  Office hours are 8:00 a.m. to 4:30 p.m. Monday - Friday. Please note that voicemails left after 4:00 p.m. may not be returned until the following business day.  We are closed weekends and major holidays. You have access to a nurse at all times for urgent questions. Please call the main number to the clinic [phone] and follow the prompts.  For any non-urgent questions, you may also contact your provider using MyChart. We now offer e-Visits for anyone 94 and older to request care online for non-urgent symptoms. For details visit [email or web address].   Also download the MyChart app! Go to the app store, search MyChart, open the app, select [facility], and log in with your MyChart username and password.

## 2024-09-24 ENCOUNTER — Encounter: Payer: Self-pay | Admitting: Internal Medicine

## 2024-09-24 ENCOUNTER — Other Ambulatory Visit: Payer: Self-pay

## 2024-09-30 ENCOUNTER — Inpatient Hospital Stay

## 2024-09-30 VITALS — BP 118/77 | HR 83 | Temp 96.5°F | Resp 18

## 2024-09-30 DIAGNOSIS — Z5112 Encounter for antineoplastic immunotherapy: Secondary | ICD-10-CM | POA: Diagnosis not present

## 2024-09-30 DIAGNOSIS — C679 Malignant neoplasm of bladder, unspecified: Secondary | ICD-10-CM

## 2024-09-30 LAB — CBC WITH DIFFERENTIAL (CANCER CENTER ONLY)
Abs Immature Granulocytes: 0.02 K/uL (ref 0.00–0.07)
Basophils Absolute: 0 K/uL (ref 0.0–0.1)
Basophils Relative: 1 %
Eosinophils Absolute: 0.7 K/uL — ABNORMAL HIGH (ref 0.0–0.5)
Eosinophils Relative: 13 %
HCT: 34.9 % — ABNORMAL LOW (ref 36.0–46.0)
Hemoglobin: 11.8 g/dL — ABNORMAL LOW (ref 12.0–15.0)
Immature Granulocytes: 0 %
Lymphocytes Relative: 33 %
Lymphs Abs: 1.6 K/uL (ref 0.7–4.0)
MCH: 32.7 pg (ref 26.0–34.0)
MCHC: 33.8 g/dL (ref 30.0–36.0)
MCV: 96.7 fL (ref 80.0–100.0)
Monocytes Absolute: 0.5 K/uL (ref 0.1–1.0)
Monocytes Relative: 10 %
Neutro Abs: 2.1 K/uL (ref 1.7–7.7)
Neutrophils Relative %: 43 %
Platelet Count: 197 K/uL (ref 150–400)
RBC: 3.61 MIL/uL — ABNORMAL LOW (ref 3.87–5.11)
RDW: 14.2 % (ref 11.5–15.5)
WBC Count: 5 K/uL (ref 4.0–10.5)
nRBC: 0 % (ref 0.0–0.2)

## 2024-09-30 LAB — CMP (CANCER CENTER ONLY)
ALT: 7 U/L (ref 0–44)
AST: 24 U/L (ref 15–41)
Albumin: 3.8 g/dL (ref 3.5–5.0)
Alkaline Phosphatase: 64 U/L (ref 38–126)
Anion gap: 14 (ref 5–15)
BUN: 17 mg/dL (ref 8–23)
CO2: 21 mmol/L — ABNORMAL LOW (ref 22–32)
Calcium: 8.9 mg/dL (ref 8.9–10.3)
Chloride: 103 mmol/L (ref 98–111)
Creatinine: 1.54 mg/dL — ABNORMAL HIGH (ref 0.44–1.00)
GFR, Estimated: 38 mL/min — ABNORMAL LOW (ref >60)
Glucose, Bld: 158 mg/dL — ABNORMAL HIGH (ref 70–99)
Potassium: 3.2 mmol/L — ABNORMAL LOW (ref 3.5–5.1)
Sodium: 137 mmol/L (ref 135–145)
Total Bilirubin: 0.5 mg/dL (ref 0.0–1.2)
Total Protein: 7.1 g/dL (ref 6.5–8.1)

## 2024-09-30 MED ORDER — PROCHLORPERAZINE MALEATE 10 MG PO TABS
10.0000 mg | ORAL_TABLET | Freq: Once | ORAL | Status: AC
  Administered 2024-09-30: 10 mg via ORAL
  Filled 2024-09-30: qty 1

## 2024-09-30 MED ORDER — SODIUM CHLORIDE 0.9 % IV SOLN
1.0000 mg/kg | Freq: Once | INTRAVENOUS | Status: AC
  Administered 2024-09-30: 50 mg via INTRAVENOUS
  Filled 2024-09-30: qty 2

## 2024-09-30 MED ORDER — SODIUM CHLORIDE 0.9 % IV SOLN
INTRAVENOUS | Status: DC
  Filled 2024-09-30: qty 250

## 2024-09-30 NOTE — Patient Instructions (Signed)
 [facility] - A DEPT OF [facility]  Discharge Instructions: Thank you for choosing [facility] to provide your oncology and hematology care.  If you have a lab appointment with the Cancer Center, please go directly to the Cancer Center and check in at the registration area.  Wear comfortable clothing and clothing appropriate for easy access to any Portacath or PICC line.   We strive to give you quality time with your provider. You may need to reschedule your appointment if you arrive late (15 or more minutes).  Arriving late affects you and other patients whose appointments are after yours.  Also, if you miss three or more appointments without notifying the office, you may be dismissed from the clinic at the provider's discretion.      For prescription refill requests, have your pharmacy contact our office and allow 72 hours for refills to be completed.    Today you received the following chemotherapy and/or immunotherapy agents Padcev      To help prevent nausea and vomiting after your treatment, we encourage you to take your nausea medication as directed.  BELOW ARE SYMPTOMS THAT SHOULD BE REPORTED IMMEDIATELY: *FEVER GREATER THAN 100.4 F (38 C) OR HIGHER *CHILLS OR SWEATING *NAUSEA AND VOMITING THAT IS NOT CONTROLLED WITH YOUR NAUSEA MEDICATION *UNUSUAL SHORTNESS OF BREATH *UNUSUAL BRUISING OR BLEEDING *URINARY PROBLEMS (pain or burning when urinating, or frequent urination) *BOWEL PROBLEMS (unusual diarrhea, constipation, pain near the anus) TENDERNESS IN MOUTH AND THROAT WITH OR WITHOUT PRESENCE OF ULCERS (sore throat, sores in mouth, or a toothache) UNUSUAL RASH, SWELLING OR PAIN  UNUSUAL VAGINAL DISCHARGE OR ITCHING   Items with * indicate a potential emergency and should be followed up as soon as possible or go to the Emergency Department if any problems should occur.  Please show the CHEMOTHERAPY ALERT CARD or IMMUNOTHERAPY ALERT  CARD at check-in to the Emergency Department and triage nurse.  Should you have questions after your visit or need to cancel or reschedule your appointment, please contact [facility] - A DEPT OF [facility]  [phone] and follow the prompts.  Office hours are 8:00 a.m. to 4:30 p.m. Monday - Friday. Please note that voicemails left after 4:00 p.m. may not be returned until the following business day.  We are closed weekends and major holidays. You have access to a nurse at all times for urgent questions. Please call the main number to the clinic [phone] and follow the prompts.  For any non-urgent questions, you may also contact your provider using MyChart. We now offer e-Visits for anyone 78 and older to request care online for non-urgent symptoms. For details visit [email or web address].   Also download the MyChart app! Go to the app store, search "MyChart", open the app, select [facility], and log in with your MyChart username and password.

## 2024-09-30 NOTE — Progress Notes (Addendum)
 Nutrition Follow-up:  Patient with high grade urothelial carcinoma.  Receiving enfortumab and keytruda .   Met with patient during infusion.  Reports that she eats but only 2 meals a day.  Drinks coffee in the am and has never been a breakfast eater.  Wakes up around noon but does not go to bed until early am 1-3am.  Reports many of her favorite foods taste horrible (ie peanut butter, nuts).  Drinks Fairlife milk.  Does not drink oral nutrition supplements. Said that she tried a boost but did not like it.  Yesterday ate 3 scrambled eggs and chicken salad sandwich.      Medications: olanzapine , synthroid , compazine , pepcid   Labs: K 3.2, glucose 158, creatinine 1.54  Anthropometrics:   Weight 98 lb 1.4 oz 99 lb 8 oz on 11/5 112 lb 3.2 oz on 4/1 120 lb 10/21/23   NUTRITION DIAGNOSIS: Inadequate oral intake ongoing   INTERVENTION:  Encouraged setting alarm on phone to remind her to eat q 2-3 hours.   Discussed ways to add calories in foods Recommend trial of metaqil to see if helps with taste change.  Information written for patient.  Samples of ensure complete, boost [employer], [employer] 1.4, boost breeze shake given for patient to try. Discussed Fairlife shakes as she drinks Fairlife milk Previously was making a banana milkshake with ice cream but has not recently.  Will start again Contact information provided    MONITORING, EVALUATION, GOAL: weight trends, intake   NEXT VISIT: Wed, Jan 7 phone call  Deontay Ladnier B. Dasie SOLON, CSO, LDN Registered Dietitian [phone]

## 2024-10-02 ENCOUNTER — Other Ambulatory Visit: Payer: Self-pay

## 2024-10-06 ENCOUNTER — Other Ambulatory Visit: Payer: Self-pay | Admitting: Physician Assistant

## 2024-10-09 ENCOUNTER — Ambulatory Visit: Admitting: Physician Assistant

## 2024-10-09 ENCOUNTER — Encounter: Payer: Self-pay | Admitting: Physician Assistant

## 2024-10-09 VITALS — BP 112/65 | HR 71 | Temp 98.0°F | Resp 16 | Ht 65.0 in | Wt 100.0 lb

## 2024-10-09 DIAGNOSIS — Z0001 Encounter for general adult medical examination with abnormal findings: Secondary | ICD-10-CM

## 2024-10-09 DIAGNOSIS — R300 Dysuria: Secondary | ICD-10-CM | POA: Diagnosis not present

## 2024-10-09 DIAGNOSIS — C679 Malignant neoplasm of bladder, unspecified: Secondary | ICD-10-CM

## 2024-10-09 DIAGNOSIS — E782 Mixed hyperlipidemia: Secondary | ICD-10-CM | POA: Diagnosis not present

## 2024-10-09 DIAGNOSIS — R5383 Other fatigue: Secondary | ICD-10-CM | POA: Diagnosis not present

## 2024-10-09 DIAGNOSIS — E538 Deficiency of other specified B group vitamins: Secondary | ICD-10-CM | POA: Diagnosis not present

## 2024-10-09 DIAGNOSIS — E039 Hypothyroidism, unspecified: Secondary | ICD-10-CM | POA: Diagnosis not present

## 2024-10-09 DIAGNOSIS — Z1231 Encounter for screening mammogram for malignant neoplasm of breast: Secondary | ICD-10-CM | POA: Diagnosis not present

## 2024-10-09 DIAGNOSIS — E559 Vitamin D deficiency, unspecified: Secondary | ICD-10-CM | POA: Diagnosis not present

## 2024-10-09 NOTE — Progress Notes (Signed)
 " [facility] [address] [place], [place] [address]  Internal MEDICINE  Office Visit Note  Patient Name: Barbara [name]  [id number]  [id number]  Date of Service: 10/09/2024  Chief Complaint  Patient presents with   Annual Exam     HPI Pt is here for routine health maintenance examination -Ongoing cancer treatment, will have repeat scan in 4 month. -States found to have elevated TSH after treatments started and oncology put her on synthroid  and are monitoring -appetite poor due to treatments, states oncology has been monitoring and encouraging protein shakes to help -unsure if she wants mammogram, but will order and allow patient to schedule if/when ready -Doing well with meds  Current Medication: Outpatient Encounter Medications as of 10/09/2024  Medication Sig   carboxymethylcellulose (REFRESH PLUS) 0.5 % SOLN Place 1 drop into both eyes 3 (three) times daily as needed (dry eyes).   escitalopram  (LEXAPRO ) 5 MG tablet Take 1 tablet (5 mg total) by mouth at bedtime.   estradiol  (ESTRACE ) 1 MG tablet TAKE 1 TABLET BY MOUTH DAILY (Patient taking differently: Take 1 mg by mouth at bedtime.)   famotidine  (PEPCID ) 20 MG tablet Take 20 mg by mouth at bedtime.   levothyroxine  (SYNTHROID ) 50 MCG tablet Take 1 tablet (50 mcg total) by mouth daily before breakfast. Do not eat or drink until 30 mins- 1 hour after taking the pill.   prochlorperazine  (COMPAZINE ) 10 MG tablet TAKE 1 TABLET BY MOUTH EVERY 6 HOURS AS NEEDED FOR NAUSEA OR VOMITING.   topiramate  (TOPAMAX ) 50 MG tablet TAKE 2 TABLETS BY MOUTH AT  BEDTIME (Patient taking differently: Take 50-100 mg by mouth at bedtime.)   traMADol  (ULTRAM ) 50 MG tablet Take 1-2 tablets (50-100 mg total) by mouth every 6 (six) hours as needed for moderate pain (pain score 4-6) or severe pain (pain score 7-10) (from bladder cancer).   [DISCONTINUED] OLANZapine  (ZYPREXA ) 5 MG tablet TAKE 1 TABLET BY MOUTH AT NIGHT   Facility-Administered  Encounter Medications as of 10/09/2024  Medication   heparin  lock flush 100 unit/mL   heparin  lock flush 100 unit/mL   heparin  lock flush 100 unit/mL   sodium chloride  flush (NS) 0.9 % injection 10 mL   sodium chloride  flush (NS) 0.9 % injection 10 mL   sodium chloride  flush (NS) 0.9 % injection 10 mL    Surgical History: Past Surgical History:  Procedure Laterality Date   ABDOMINAL HYSTERECTOMY     BLADDER SURGERY     BREAST BIOPSY Left 2014   neg   CYSTOSCOPY W/ RETROGRADES Bilateral 01/29/2024   Procedure: CYSTOSCOPY, WITH RETROGRADE PYELOGRAM;  Surgeon: Alvaro Ricardo KATHEE Mickey., MD;  Location: [facility];  Service: Urology;  Laterality: Bilateral;   CYSTOSCOPY W/ RETROGRADES Left 08/12/2024   Procedure: CYSTOSCOPY, WITH RETROGRADE PYELOGRAM;  Surgeon: Alvaro Ricardo KATHEE Mickey., MD;  Location: [facility];  Service: Urology;  Laterality: Left;   CYSTOSCOPY W/ URETERAL STENT PLACEMENT Left 04/04/2023   Procedure: CYSTOSCOPY WITH RETROGRADE PYELOGRAM/URETERAL STENT PLACEMENT;  Surgeon: Penne Knee, MD;  Location: [facility];  Service: Urology;  Laterality: Left;   CYSTOSCOPY W/ URETERAL STENT PLACEMENT Left 08/12/2024   Procedure: STENT EXCHANGE;  Surgeon: Alvaro Ricardo KATHEE Mickey., MD;  Location: [facility];  Service: Urology;  Laterality: Left;   CYSTOSCOPY WITH STENT PLACEMENT Left 01/29/2024   Procedure: CYSTOSCOPY, WITH STENT INSERTION;  Surgeon: Alvaro Ricardo KATHEE Mickey., MD;  Location: [facility];  Service: Urology;  Laterality: Left;   FOOT SURGERY Right  2009   IR IMAGING GUIDED PORT INSERTION  05/03/2023   LAPAROSCOPIC APPENDECTOMY  12/05/2012   NASAL SEPTUM SURGERY     ROBOT ASSISTED LAPAROSCOPIC COMPLETE CYSTECT ILEAL CONDUIT N/A 09/25/2023   Procedure: XI ROBOTIC ASSISTED LAPAROSCOPIC EXTENSIVE LYSIS OF ADHESIONS, PERITONEAL BIOPSIES;  Surgeon: Alvaro Ricardo KATHEE Mickey., MD;  Location: [facility];  Service: Urology;  Laterality: N/A;   SISTRUNK PROCEDURE  03/03/2012   Thyroglossal duct cyst excision    TONSILLECTOMY     TRANSURETHRAL RESECTION OF BLADDER TUMOR N/A 04/04/2023   Procedure: TRANSURETHRAL RESECTION OF BLADDER TUMOR (TURBT);  Surgeon: Penne Knee, MD;  Location: [facility];  Service: Urology;  Laterality: N/A;   TRANSURETHRAL RESECTION OF BLADDER TUMOR N/A 08/12/2024   Procedure: TURBT (TRANSURETHRAL RESECTION OF BLADDER TUMOR);  Surgeon: Alvaro Ricardo KATHEE Mickey., MD;  Location: [facility];  Service: Urology;  Laterality: N/A;    Medical History: Past Medical History:  Diagnosis Date   Adopted    Allergic rhinitis    Anemia    h/o   Anxiety    Arthritis    B12 deficiency    Bilateral carotid artery stenosis    Bilateral carotid bruits    Bladder tumor    Cancer (HCC)    bladder cancer   Depression    Gallbladder polyp    GERD (gastroesophageal reflux disease)    Heart murmur    Hydronephrosis of left kidney    Hypothyroidism    Migraines    Tobacco abuse    UTI (urinary tract infection)     Family History: Family History  Adopted: Yes      Review of Systems  Constitutional:  Positive for fatigue. Negative for chills and unexpected weight change.  HENT:  Negative for congestion, rhinorrhea, sneezing and sore throat.   Eyes:  Negative for redness.  Respiratory:  Negative for cough, chest tightness and shortness of breath.   Cardiovascular:  Negative for chest pain and palpitations.  Gastrointestinal:  Negative for abdominal pain, constipation, diarrhea, nausea and vomiting.  Genitourinary:  Negative for frequency.       Urinary incontinence  Musculoskeletal:  Negative for back pain, joint swelling and neck pain.  Skin:  Negative for wound.  Neurological: Negative.  Negative for tremors and numbness.  Hematological:  Negative for adenopathy. Does not bruise/bleed easily.  Psychiatric/Behavioral:  Negative for behavioral problems (Depression), sleep disturbance and suicidal ideas.      Vital Signs: BP 112/65   Pulse 71   Temp 98 F (36.7 C)   Resp 16    Ht 5' 5 (1.651 m)   Wt 100 lb (45.4 kg)   SpO2 98%   BMI 16.64 kg/m    Physical Exam Vitals and nursing note reviewed.  Constitutional:      General: She is not in acute distress.    Appearance: Normal appearance. She is well-developed and normal weight. She is not diaphoretic.  HENT:     Head: Normocephalic and atraumatic.  Eyes:     Extraocular Movements: Extraocular movements intact.  Neck:     Thyroid : No thyromegaly.     Vascular: No JVD.     Trachea: No tracheal deviation.  Cardiovascular:     Rate and Rhythm: Normal rate and regular rhythm.     Heart sounds: Normal heart sounds. No murmur heard.    No friction rub. No gallop.  Pulmonary:     Effort: Pulmonary effort is normal. No respiratory distress.     Breath sounds: No  wheezing or rales.  Chest:     Chest wall: No tenderness.  Abdominal:     General: Bowel sounds are normal.  Musculoskeletal:        General: Normal range of motion.  Skin:    General: Skin is warm and dry.  Neurological:     Mental Status: She is alert and oriented to person, place, and time.  Psychiatric:        Behavior: Behavior normal.        Thought Content: Thought content normal.        Judgment: Judgment normal.      LABS: Recent Results (from the past 2160 hours)  CMP (Cancer Center only)     Status: Abnormal   Collection Time: 07/15/24  8:42 AM  Result Value Ref Range   Sodium 135 135 - 145 mmol/L   Potassium 3.4 (L) 3.5 - 5.1 mmol/L   Chloride 105 98 - 111 mmol/L   CO2 23 22 - 32 mmol/L   Glucose, Bld 84 70 - 99 mg/dL    Comment: Glucose reference range applies only to samples taken after fasting for at least 8 hours.   BUN 20 8 - 23 mg/dL   Creatinine 8.66 (H) 9.55 - 1.00 mg/dL   Calcium  8.7 (L) 8.9 - 10.3 mg/dL   Total Protein 7.5 6.5 - 8.1 g/dL   Albumin 3.7 3.5 - 5.0 g/dL   AST 19 15 - 41 U/L   ALT 9 0 - 44 U/L   Alkaline Phosphatase 49 38 - 126 U/L   Total Bilirubin 0.2 0.0 - 1.2 mg/dL   GFR, Estimated 45  (L) >60 mL/min    Comment: (NOTE) Calculated using the CKD-EPI Creatinine Equation (2021)    Anion gap 7 5 - 15    Comment: Performed at [facility], [address] [place], [place] [address]  CBC with Differential (Cancer Center Only)     Status: Abnormal   Collection Time: 07/15/24  8:42 AM  Result Value Ref Range   WBC Count 6.0 4.0 - 10.5 K/uL   RBC 3.50 (L) 3.87 - 5.11 MIL/uL   Hemoglobin 11.5 (L) 12.0 - 15.0 g/dL   HCT 66.3 (L) 63.9 - 53.9 %   MCV 96.0 80.0 - 100.0 fL   MCH 32.9 26.0 - 34.0 pg   MCHC 34.2 30.0 - 36.0 g/dL   RDW 85.7 88.4 - 84.4 %   Platelet Count 199 150 - 400 K/uL   nRBC 0.0 0.0 - 0.2 %   Neutrophils Relative % 39 %   Neutro Abs 2.3 1.7 - 7.7 K/uL   Lymphocytes Relative 40 %   Lymphs Abs 2.4 0.7 - 4.0 K/uL   Monocytes Relative 9 %   Monocytes Absolute 0.6 0.1 - 1.0 K/uL   Eosinophils Relative 11 %   Eosinophils Absolute 0.7 (H) 0.0 - 0.5 K/uL   Basophils Relative 1 %   Basophils Absolute 0.0 0.0 - 0.1 K/uL   Immature Granulocytes 0 %   Abs Immature Granulocytes 0.01 0.00 - 0.07 K/uL    Comment: Performed at [facility], [address] [place], [place] [address]  TSH     Status: Abnormal   Collection Time: 07/15/24  8:42 AM  Result Value Ref Range   TSH 8.569 (H) 0.350 - 4.500 uIU/mL    Comment: Performed by a 3rd Generation assay with a functional sensitivity of <=0.01 uIU/mL. Performed at [facility], [address], [place], [place]  [address]   Iron and TIBC     Status: Abnormal   Collection Time: 07/15/24  8:42 AM  Result Value Ref Range   Iron 49 28 - 170 ug/dL   TIBC 771 (L) 749 - 549 ug/dL   Saturation Ratios 22 10.4 - 31.8 %   UIBC 179 ug/dL    Comment: Performed at [facility], [address] [place], [place] [address]  Ferritin     Status: None   Collection Time: 07/15/24  8:42 AM  Result Value Ref Range   Ferritin 83 11 - 307 ng/mL    Comment: Performed at [facility], [address] [place], [place] [address]  CMP (Cancer Center only)     Status: Abnormal   Collection Time: 07/21/24 12:06 PM  Result Value Ref Range   Sodium 134 (L) 135 - 145 mmol/L   Potassium 3.2 (L) 3.5 - 5.1 mmol/L   Chloride 104 98 - 111 mmol/L   CO2 22 22 - 32 mmol/L   Glucose, Bld 126 (H) 70 - 99 mg/dL    Comment: Glucose reference range applies only to samples taken after fasting for at least 8 hours.   BUN 24 (H) 8 - 23 mg/dL   Creatinine 8.55 (H) 9.55 - 1.00 mg/dL   Calcium  8.8 (L) 8.9 - 10.3 mg/dL   Total Protein 7.5 6.5 - 8.1 g/dL   Albumin 3.7 3.5 - 5.0 g/dL   AST 22 15 - 41 U/L   ALT 8 0 - 44 U/L   Alkaline Phosphatase 54 38 - 126 U/L   Total Bilirubin 1.0 0.0 - 1.2 mg/dL   GFR, Estimated 41 (L) >60 mL/min    Comment: (NOTE) Calculated using the CKD-EPI Creatinine Equation (2021)    Anion gap 8 5 - 15    Comment: Performed at [facility], [address] [place], [place] [address]  CBC with Differential (Cancer Center Only)     Status: Abnormal   Collection Time: 07/21/24 12:06 PM  Result Value Ref Range   WBC Count 5.6 4.0 - 10.5 K/uL   RBC 3.77 (L) 3.87 - 5.11 MIL/uL   Hemoglobin 12.2 12.0 - 15.0 g/dL   HCT 64.0 (L) 63.9 - 53.9 %   MCV 95.2 80.0 - 100.0 fL   MCH 32.4 26.0 - 34.0 pg   MCHC 34.0 30.0 - 36.0 g/dL   RDW 85.7 88.4 - 84.4 %   Platelet Count 200 150 - 400 K/uL   nRBC 0.0 0.0 - 0.2 %   Neutrophils Relative % 47 %   Neutro Abs 2.7 1.7 - 7.7 K/uL   Lymphocytes Relative 34 %   Lymphs Abs 1.9 0.7 - 4.0 K/uL   Monocytes Relative 11 %   Monocytes Absolute 0.6 0.1 - 1.0 K/uL   Eosinophils Relative 7 %   Eosinophils Absolute 0.4 0.0 - 0.5 K/uL   Basophils Relative 1 %   Basophils Absolute 0.0 0.0 - 0.1 K/uL   Immature Granulocytes 0 %   Abs Immature Granulocytes 0.02 0.00 - 0.07 K/uL    Comment: Performed at [facility], [address] [place], [place] [address]  CMP (Cancer Center only)     Status: Abnormal   Collection Time: 08/04/24  10:22 AM  Result Value Ref Range   Sodium 135 135 - 145 mmol/L   Potassium 3.1 (L) 3.5 - 5.1 mmol/L   Chloride 106 98 - 111 mmol/L   CO2 22 22 - 32  mmol/L   Glucose, Bld 87 70 - 99 mg/dL    Comment: Glucose reference range applies only to samples taken after fasting for at least 8 hours.   BUN 20 8 - 23 mg/dL   Creatinine 8.62 (H) 9.55 - 1.00 mg/dL   Calcium  8.4 (L) 8.9 - 10.3 mg/dL   Total Protein 7.1 6.5 - 8.1 g/dL   Albumin 3.4 (L) 3.5 - 5.0 g/dL   AST 19 15 - 41 U/L   ALT 8 0 - 44 U/L   Alkaline Phosphatase 49 38 - 126 U/L   Total Bilirubin 0.6 0.0 - 1.2 mg/dL   GFR, Estimated 43 (L) >60 mL/min    Comment: (NOTE) Calculated using the CKD-EPI Creatinine Equation (2021)    Anion gap 7 5 - 15    Comment: Performed at [facility], [address] [place], [place] [address]  CBC with Differential (Cancer Center Only)     Status: Abnormal   Collection Time: 08/04/24 10:22 AM  Result Value Ref Range   WBC Count 5.1 4.0 - 10.5 K/uL   RBC 3.47 (L) 3.87 - 5.11 MIL/uL   Hemoglobin 11.2 (L) 12.0 - 15.0 g/dL   HCT 67.0 (L) 63.9 - 53.9 %   MCV 94.8 80.0 - 100.0 fL   MCH 32.3 26.0 - 34.0 pg   MCHC 34.0 30.0 - 36.0 g/dL   RDW 85.4 88.4 - 84.4 %   Platelet Count 199 150 - 400 K/uL   nRBC 0.0 0.0 - 0.2 %   Neutrophils Relative % 43 %   Neutro Abs 2.2 1.7 - 7.7 K/uL   Lymphocytes Relative 37 %   Lymphs Abs 1.9 0.7 - 4.0 K/uL   Monocytes Relative 9 %   Monocytes Absolute 0.5 0.1 - 1.0 K/uL   Eosinophils Relative 10 %   Eosinophils Absolute 0.5 0.0 - 0.5 K/uL   Basophils Relative 1 %   Basophils Absolute 0.0 0.0 - 0.1 K/uL   Immature Granulocytes 0 %   Abs Immature Granulocytes 0.01 0.00 - 0.07 K/uL    Comment: Performed at [facility], [address] [place], [place] [address]  Basic metabolic panel     Status: Abnormal   Collection Time: 08/10/24  1:11 PM  Result Value Ref Range   Sodium 137 135 - 145 mmol/L   Potassium 3.4 (L) 3.5 - 5.1 mmol/L   Chloride 103  98 - 111 mmol/L   CO2 25 22 - 32 mmol/L   Glucose, Bld 82 70 - 99 mg/dL    Comment: Glucose reference range applies only to samples taken after fasting for at least 8 hours.   BUN 20 8 - 23 mg/dL   Creatinine, Ser 8.66 (H) 0.44 - 1.00 mg/dL   Calcium  9.3 8.9 - 10.3 mg/dL   GFR, Estimated 45 (L) >60 mL/min    Comment: (NOTE) Calculated using the CKD-EPI Creatinine Equation (2021)    Anion gap 9 5 - 15    Comment: Performed at [facility], [address], [place], [place] [address]  CMP (Cancer Center only)     Status: Abnormal   Collection Time: 08/11/24 12:05 PM  Result Value Ref Range   Sodium 137 135 - 145 mmol/L   Potassium 3.4 (L) 3.5 - 5.1 mmol/L   Chloride 106 98 - 111 mmol/L   CO2 23 22 - 32 mmol/L   Glucose, Bld 81 70 - 99 mg/dL    Comment: Glucose reference range applies  only to samples taken after fasting for at least 8 hours.   BUN 24 (H) 8 - 23 mg/dL   Creatinine 8.78 (H) 9.55 - 1.00 mg/dL   Calcium  8.7 (L) 8.9 - 10.3 mg/dL   Total Protein 7.2 6.5 - 8.1 g/dL   Albumin 3.5 3.5 - 5.0 g/dL   AST 19 15 - 41 U/L   ALT 8 0 - 44 U/L   Alkaline Phosphatase 51 38 - 126 U/L   Total Bilirubin 0.6 0.0 - 1.2 mg/dL   GFR, Estimated 50 (L) >60 mL/min    Comment: (NOTE) Calculated using the CKD-EPI Creatinine Equation (2021)    Anion gap 8 5 - 15    Comment: Performed at [facility], [address] [place], [place] [address]  CBC with Differential (Cancer Center Only)     Status: Abnormal   Collection Time: 08/11/24 12:05 PM  Result Value Ref Range   WBC Count 4.6 4.0 - 10.5 K/uL   RBC 3.46 (L) 3.87 - 5.11 MIL/uL   Hemoglobin 11.3 (L) 12.0 - 15.0 g/dL   HCT 67.0 (L) 63.9 - 53.9 %   MCV 95.1 80.0 - 100.0 fL   MCH 32.7 26.0 - 34.0 pg   MCHC 34.3 30.0 - 36.0 g/dL   RDW 85.4 88.4 - 84.4 %   Platelet Count 177 150 - 400 K/uL   nRBC 0.0 0.0 - 0.2 %   Neutrophils Relative % 41 %   Neutro Abs 1.9 1.7 - 7.7 K/uL   Lymphocytes Relative 39 %   Lymphs  Abs 1.8 0.7 - 4.0 K/uL   Monocytes Relative 11 %   Monocytes Absolute 0.5 0.1 - 1.0 K/uL   Eosinophils Relative 8 %   Eosinophils Absolute 0.4 0.0 - 0.5 K/uL   Basophils Relative 1 %   Basophils Absolute 0.0 0.0 - 0.1 K/uL   Immature Granulocytes 0 %   Abs Immature Granulocytes 0.02 0.00 - 0.07 K/uL    Comment: Performed at [facility] [place], [address] [place], [place] [address]  Surgical pathology     Status: None   Collection Time: 08/12/24  2:04 PM  Result Value Ref Range   SURGICAL PATHOLOGY      SURGICAL PATHOLOGY CASE: [id number] PATIENT: BASCOM PESA Surgical Pathology Report     Clinical History: Bladder cancer (las)     FINAL MICROSCOPIC DIAGNOSIS:  A. 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  The specimen is entirely submitted in A1. (WC 08/12/2024)   Final Diagnosis performed by Zhaoli Lane, MD.   Electronically signed 08/13/2024 Technical and / or Professional components performed at [facility], [address], [place], [place] [address].  Immunohistochemistry Technical component (if applicable) was performed at [facility] [address], [place], [place]  [address].   IMMUNOHISTOCHEMISTRY DISCLAIMER (if applicable): Some of these immunohistochemical stains may have been developed and the performance characteristics determine by [facility]. Some may not have been cleared or approved by the U.S. Food and Drug Administration. The FDA has determined that such clearance or approval is not necessary. This test is used for clinical purposes. It should not be  regarded as investigational or for research. This laboratory  is certified under the Clinical Laboratory Improvement Amendments of 1988 (CLIA-88) as qualified to perform high complexity clinical laboratory testing.  The controls stained appropriately.   IHC stains are performed on formalin fixed, paraffin embedded tissue using a 3,3diaminobenzidine (DAB) chromogen and Leica Bond Autostainer System. The staining intensity of the nucleus is score manually and is reported as the percentage of tumor cell nuclei demonstrating specific nuclear staining. The specimens are  fixed in 10% Neutral Formalin for at least 6 hours and up to 72hrs. These tests are validated on decalcified tissue. Results should be interpreted with caution given the possibility of false negative results on decalcified specimens. Antibody Clones are as follows ER-clone 67F, PR-clone 16, Ki67- clone MM1. Some of these immunohistochemical stains may have been developed and the performance characteristics determined by [facility].   TSH     Status: Abnormal   Collection Time: 08/26/24  8:57 AM  Result Value Ref Range   TSH 9.308 (H) 0.350 - 4.500 uIU/mL    Comment: Performed by a 3rd Generation assay with a functional sensitivity of <=0.01 uIU/mL. Performed at [facility], [address] [place], [place] [address]   T4, free     Status: None   Collection Time: 08/26/24  8:57 AM  Result Value Ref Range   Free T4 0.93 0.61 - 1.12 ng/dL    Comment: (NOTE) Biotin ingestion may interfere with free T4 tests. If the results are inconsistent with the TSH level, previous test results, or the clinical presentation, then consider biotin interference. If needed, order repeat testing after stopping biotin. Performed at [facility], [address] [place], [place] [address]   CBC with Differential (Cancer Center Only)     Status: Abnormal   Collection Time: 08/26/24  8:57 AM  Result Value Ref Range    WBC Count 5.7 4.0 - 10.5 K/uL   RBC 3.57 (L) 3.87 - 5.11 MIL/uL   Hemoglobin 11.6 (L) 12.0 - 15.0 g/dL   HCT 65.5 (L) 63.9 - 53.9 %   MCV 96.4 80.0 - 100.0 fL   MCH 32.5 26.0 - 34.0 pg   MCHC 33.7 30.0 - 36.0 g/dL   RDW 85.5 88.4 - 84.4 %   Platelet Count 211 150 - 400 K/uL   nRBC 0.0 0.0 - 0.2 %   Neutrophils Relative % 40 %   Neutro Abs 2.3 1.7 - 7.7 K/uL   Lymphocytes Relative 41 %   Lymphs Abs 2.3 0.7 - 4.0 K/uL   Monocytes Relative 9 %   Monocytes Absolute 0.5 0.1 - 1.0 K/uL   Eosinophils Relative 9 %   Eosinophils Absolute 0.5 0.0 - 0.5 K/uL   Basophils Relative 1 %   Basophils Absolute 0.0 0.0 - 0.1 K/uL   Immature Granulocytes 0 %   Abs Immature Granulocytes 0.02 0.00 - 0.07 K/uL    Comment: Performed at [facility], [address] [place], [place] [address]  CMP (Cancer Center only)     Status: Abnormal   Collection Time: 08/26/24  8:57 AM  Result Value Ref Range   Sodium 136 135 - 145 mmol/L   Potassium 3.2 (L) 3.5 - 5.1 mmol/L   Chloride 106 98 - 111 mmol/L   CO2 22 22 - 32 mmol/L   Glucose, Bld 89 70 - 99 mg/dL    Comment: Glucose reference range applies only to samples taken after fasting for at least 8 hours.   BUN 18 8 - 23 mg/dL  Creatinine 1.32 (H) 0.44 - 1.00 mg/dL   Calcium  8.5 (L) 8.9 - 10.3 mg/dL   Total Protein 7.1 6.5 - 8.1 g/dL   Albumin 3.4 (L) 3.5 - 5.0 g/dL   AST 19 15 - 41 U/L   ALT 7 0 - 44 U/L   Alkaline Phosphatase 56 38 - 126 U/L   Total Bilirubin 0.6 0.0 - 1.2 mg/dL   GFR, Estimated 45 (L) >60 mL/min    Comment: (NOTE) Calculated using the CKD-EPI Creatinine Equation (2021)    Anion gap 8 5 - 15    Comment: Performed at [facility], [address] [place], [place] [address]  CMP (Cancer Center only)     Status: Abnormal   Collection Time: 09/01/24 12:26 PM  Result Value Ref Range   Sodium 135 135 - 145 mmol/L   Potassium 3.2 (L) 3.5 - 5.1 mmol/L   Chloride 105 98 - 111 mmol/L   CO2 22 22 - 32 mmol/L    Glucose, Bld 103 (H) 70 - 99 mg/dL    Comment: Glucose reference range applies only to samples taken after fasting for at least 8 hours.   BUN 19 8 - 23 mg/dL   Creatinine 8.63 (H) 9.55 - 1.00 mg/dL   Calcium  8.4 (L) 8.9 - 10.3 mg/dL   Total Protein 7.1 6.5 - 8.1 g/dL   Albumin 3.4 (L) 3.5 - 5.0 g/dL   AST 21 15 - 41 U/L   ALT 8 0 - 44 U/L   Alkaline Phosphatase 52 38 - 126 U/L   Total Bilirubin 0.8 0.0 - 1.2 mg/dL   GFR, Estimated 44 (L) >60 mL/min    Comment: (NOTE) Calculated using the CKD-EPI Creatinine Equation (2021)    Anion gap 8 5 - 15    Comment: Performed at [facility], [address] [place], [place] [address]  CBC with Differential (Cancer Center Only)     Status: Abnormal   Collection Time: 09/01/24 12:26 PM  Result Value Ref Range   WBC Count 6.2 4.0 - 10.5 K/uL   RBC 3.68 (L) 3.87 - 5.11 MIL/uL   Hemoglobin 11.9 (L) 12.0 - 15.0 g/dL   HCT 64.5 (L) 63.9 - 53.9 %   MCV 96.2 80.0 - 100.0 fL   MCH 32.3 26.0 - 34.0 pg   MCHC 33.6 30.0 - 36.0 g/dL   RDW 85.5 88.4 - 84.4 %   Platelet Count 200 150 - 400 K/uL   nRBC 0.0 0.0 - 0.2 %   Neutrophils Relative % 60 %   Neutro Abs 3.7 1.7 - 7.7 K/uL   Lymphocytes Relative 24 %   Lymphs Abs 1.5 0.7 - 4.0 K/uL   Monocytes Relative 8 %   Monocytes Absolute 0.5 0.1 - 1.0 K/uL   Eosinophils Relative 8 %   Eosinophils Absolute 0.5 0.0 - 0.5 K/uL   Basophils Relative 0 %   Basophils Absolute 0.0 0.0 - 0.1 K/uL   Immature Granulocytes 0 %   Abs Immature Granulocytes 0.02 0.00 - 0.07 K/uL    Comment: Performed at [facility], [address] [place], [place] [address]  Glucose, capillary     Status: None   Collection Time: 09/15/24  9:31 AM  Result Value Ref Range   Glucose-Capillary 80 70 - 99 mg/dL    Comment: Glucose reference range applies only to samples taken after fasting for at least 8 hours.  CMP (Cancer Center only)     Status: Abnormal  Collection Time: 09/23/24  8:43 AM  Result Value Ref Range    Sodium 141 135 - 145 mmol/L   Potassium 3.5 3.5 - 5.1 mmol/L   Chloride 106 98 - 111 mmol/L   CO2 25 22 - 32 mmol/L   Glucose, Bld 89 70 - 99 mg/dL    Comment: Glucose reference range applies only to samples taken after fasting for at least 8 hours.   BUN 15 8 - 23 mg/dL   Creatinine 8.52 (H) 9.55 - 1.00 mg/dL   Calcium  9.2 8.9 - 10.3 mg/dL   Total Protein 6.9 6.5 - 8.1 g/dL   Albumin 3.7 3.5 - 5.0 g/dL   AST 22 15 - 41 U/L   ALT 6 0 - 44 U/L   Alkaline Phosphatase 60 38 - 126 U/L   Total Bilirubin 0.4 0.0 - 1.2 mg/dL   GFR, Estimated 40 (L) >60 mL/min    Comment: (NOTE) Calculated using the CKD-EPI Creatinine Equation (2021)    Anion gap 10 5 - 15    Comment: Performed at [facility], [address] [place], [place] [address]  CBC with Differential (Cancer Center Only)     Status: Abnormal   Collection Time: 09/23/24  8:43 AM  Result Value Ref Range   WBC Count 5.4 4.0 - 10.5 K/uL   RBC 3.48 (L) 3.87 - 5.11 MIL/uL   Hemoglobin 11.5 (L) 12.0 - 15.0 g/dL   HCT 66.2 (L) 63.9 - 53.9 %   MCV 96.8 80.0 - 100.0 fL   MCH 33.0 26.0 - 34.0 pg   MCHC 34.1 30.0 - 36.0 g/dL   RDW 84.8 88.4 - 84.4 %   Platelet Count 191 150 - 400 K/uL   nRBC 0.0 0.0 - 0.2 %   Neutrophils Relative % 44 %   Neutro Abs 2.4 1.7 - 7.7 K/uL   Lymphocytes Relative 36 %   Lymphs Abs 1.9 0.7 - 4.0 K/uL   Monocytes Relative 10 %   Monocytes Absolute 0.6 0.1 - 1.0 K/uL   Eosinophils Relative 8 %   Eosinophils Absolute 0.4 0.0 - 0.5 K/uL   Basophils Relative 1 %   Basophils Absolute 0.0 0.0 - 0.1 K/uL   Immature Granulocytes 1 %   Abs Immature Granulocytes 0.03 0.00 - 0.07 K/uL    Comment: Performed at [facility], [address] [place], [place] [address]  CMP (Cancer Center only)     Status: Abnormal   Collection Time: 09/30/24  1:48 PM  Result Value Ref Range   Sodium 137 135 - 145 mmol/L   Potassium 3.2 (L) 3.5 - 5.1 mmol/L   Chloride 103 98 - 111 mmol/L   CO2 21 (L) 22 - 32  mmol/L   Glucose, Bld 158 (H) 70 - 99 mg/dL    Comment: Glucose reference range applies only to samples taken after fasting for at least 8 hours.   BUN 17 8 - 23 mg/dL   Creatinine 8.45 (H) 9.55 - 1.00 mg/dL   Calcium  8.9 8.9 - 10.3 mg/dL   Total Protein 7.1 6.5 - 8.1 g/dL   Albumin 3.8 3.5 - 5.0 g/dL   AST 24 15 - 41 U/L   ALT 7 0 - 44 U/L   Alkaline Phosphatase 64 38 - 126 U/L   Total Bilirubin 0.5 0.0 - 1.2 mg/dL   GFR, Estimated 38 (L) >60 mL/min    Comment: (NOTE) Calculated using the CKD-EPI Creatinine Equation (2021)    Anion  gap 14 5 - 15    Comment: Performed at [facility], [address] [place], [place] [address]  CBC with Differential (Cancer Center Only)     Status: Abnormal   Collection Time: 09/30/24  1:48 PM  Result Value Ref Range   WBC Count 5.0 4.0 - 10.5 K/uL   RBC 3.61 (L) 3.87 - 5.11 MIL/uL   Hemoglobin 11.8 (L) 12.0 - 15.0 g/dL   HCT 65.0 (L) 63.9 - 53.9 %   MCV 96.7 80.0 - 100.0 fL   MCH 32.7 26.0 - 34.0 pg   MCHC 33.8 30.0 - 36.0 g/dL   RDW 85.7 88.4 - 84.4 %   Platelet Count 197 150 - 400 K/uL   nRBC 0.0 0.0 - 0.2 %   Neutrophils Relative % 43 %   Neutro Abs 2.1 1.7 - 7.7 K/uL   Lymphocytes Relative 33 %   Lymphs Abs 1.6 0.7 - 4.0 K/uL   Monocytes Relative 10 %   Monocytes Absolute 0.5 0.1 - 1.0 K/uL   Eosinophils Relative 13 %   Eosinophils Absolute 0.7 (H) 0.0 - 0.5 K/uL   Basophils Relative 1 %   Basophils Absolute 0.0 0.0 - 0.1 K/uL   Immature Granulocytes 0 %   Abs Immature Granulocytes 0.02 0.00 - 0.07 K/uL    Comment: Performed at [facility], [address], [place], [place] [address]        Assessment/Plan: 1. Encounter for general adult medical examination with abnormal findings (Primary) CPE performed, labs ordered, mammogram ordered  2. Malignant neoplasm of urinary bladder, unspecified site [facility]) Followed by oncology  3. Hypothyroidism, unspecified type Followed by oncology with future repeat labs  already pending  4. B12 deficiency - B12 and Folate Panel  5. Vitamin D  deficiency - VITAMIN D  25 Hydroxy (Vit-D Deficiency, Fractures)  6. Mixed hyperlipidemia - Lipid Panel With LDL/HDL Ratio  7. Other fatigue - B12 and Folate Panel - Fe+TIBC+Fer - Lipid Panel With LDL/HDL Ratio - VITAMIN D  25 Hydroxy (Vit-D Deficiency, Fractures)  8. Visit for screening mammogram - MM 3D SCREENING MAMMOGRAM BILATERAL BREAST; Future  9. Dysuria - UA/M w/rflx Culture, Routine   General Counseling: Brenn verbalizes understanding of the findings of todays visit and agrees with plan of treatment. I have discussed any further diagnostic evaluation that may be needed or ordered today. We also reviewed her medications today. she has been encouraged to call the office with any questions or concerns that should arise related to todays visit.    Counseling:    Orders Placed This Encounter  Procedures   MM 3D SCREENING MAMMOGRAM BILATERAL BREAST   UA/M w/rflx Culture, Routine   B12 and Folate Panel   Fe+TIBC+Fer   Lipid Panel With LDL/HDL Ratio   VITAMIN D  25 Hydroxy (Vit-D Deficiency, Fractures)    No orders of the defined types were placed in this encounter.   This patient was seen by Tinnie Pro, PA-C in collaboration with Dr. Sigrid Bathe as a part of collaborative care agreement.  Total time spent:30 Minutes  Time spent includes review of chart, medications, test results, and follow up plan with the patient.     Sigrid CHRISTELLA Bathe, MD  Internal Medicine  "

## 2024-10-10 ENCOUNTER — Other Ambulatory Visit: Payer: Self-pay

## 2024-10-10 LAB — B12 AND FOLATE PANEL
Folate: <2 ng/mL — ABNORMAL LOW
Vitamin B-12: 286 pg/mL (ref 232–1245)

## 2024-10-10 LAB — LIPID PANEL WITH LDL/HDL RATIO
Cholesterol, Total: 205 mg/dL — ABNORMAL HIGH (ref 100–199)
HDL: 53 mg/dL
LDL Chol Calc (NIH): 132 mg/dL — ABNORMAL HIGH (ref 0–99)
LDL/HDL Ratio: 2.5 ratio (ref 0.0–3.2)
Triglycerides: 112 mg/dL (ref 0–149)
VLDL Cholesterol Cal: 20 mg/dL (ref 5–40)

## 2024-10-10 LAB — IRON,TIBC AND FERRITIN PANEL
Ferritin: 181 ng/mL — ABNORMAL HIGH (ref 15–150)
Iron Saturation: 23 % (ref 15–55)
Iron: 47 ug/dL (ref 27–139)
Total Iron Binding Capacity: 207 ug/dL — ABNORMAL LOW (ref 250–450)
UIBC: 160 ug/dL (ref 118–369)

## 2024-10-10 LAB — VITAMIN D 25 HYDROXY (VIT D DEFICIENCY, FRACTURES): Vit D, 25-Hydroxy: 28.5 ng/mL — ABNORMAL LOW (ref 30.0–100.0)

## 2024-10-11 ENCOUNTER — Other Ambulatory Visit: Payer: Self-pay | Admitting: Internal Medicine

## 2024-10-12 ENCOUNTER — Encounter: Payer: Self-pay | Admitting: Internal Medicine

## 2024-10-12 ENCOUNTER — Telehealth: Payer: Self-pay | Admitting: Physician Assistant

## 2024-10-12 LAB — UA/M W/RFLX CULTURE, ROUTINE

## 2024-10-12 NOTE — Telephone Encounter (Signed)
Lvm notifying patient of mammo appointment date, arrival time, location -Barbara [name]

## 2024-10-19 ENCOUNTER — Inpatient Hospital Stay

## 2024-10-19 ENCOUNTER — Encounter: Payer: Self-pay | Admitting: Internal Medicine

## 2024-10-19 ENCOUNTER — Inpatient Hospital Stay: Admitting: Internal Medicine

## 2024-10-19 VITALS — BP 115/68 | HR 69

## 2024-10-19 VITALS — BP 130/73 | HR 84 | Temp 97.5°F | Resp 16 | Ht 65.0 in | Wt 97.9 lb

## 2024-10-19 DIAGNOSIS — C679 Malignant neoplasm of bladder, unspecified: Secondary | ICD-10-CM

## 2024-10-19 DIAGNOSIS — Z5112 Encounter for antineoplastic immunotherapy: Secondary | ICD-10-CM | POA: Diagnosis not present

## 2024-10-19 LAB — CMP (CANCER CENTER ONLY)
ALT: <5 U/L (ref 0–44)
AST: 21 U/L (ref 15–41)
Albumin: 3.8 g/dL (ref 3.5–5.0)
Alkaline Phosphatase: 61 U/L (ref 38–126)
Anion gap: 12 (ref 5–15)
BUN: 15 mg/dL (ref 8–23)
CO2: 22 mmol/L (ref 22–32)
Calcium: 8.9 mg/dL (ref 8.9–10.3)
Chloride: 105 mmol/L (ref 98–111)
Creatinine: 1.51 mg/dL — ABNORMAL HIGH (ref 0.44–1.00)
GFR, Estimated: 38 mL/min — ABNORMAL LOW
Glucose, Bld: 146 mg/dL — ABNORMAL HIGH (ref 70–99)
Potassium: 3.2 mmol/L — ABNORMAL LOW (ref 3.5–5.1)
Sodium: 139 mmol/L (ref 135–145)
Total Bilirubin: 0.3 mg/dL (ref 0.0–1.2)
Total Protein: 6.8 g/dL (ref 6.5–8.1)

## 2024-10-19 LAB — CBC WITH DIFFERENTIAL (CANCER CENTER ONLY)
Abs Immature Granulocytes: 0.01 K/uL (ref 0.00–0.07)
Basophils Absolute: 0 K/uL (ref 0.0–0.1)
Basophils Relative: 1 %
Eosinophils Absolute: 0.2 K/uL (ref 0.0–0.5)
Eosinophils Relative: 6 %
HCT: 31.7 % — ABNORMAL LOW (ref 36.0–46.0)
Hemoglobin: 10.6 g/dL — ABNORMAL LOW (ref 12.0–15.0)
Immature Granulocytes: 0 %
Lymphocytes Relative: 34 %
Lymphs Abs: 1.2 K/uL (ref 0.7–4.0)
MCH: 32.7 pg (ref 26.0–34.0)
MCHC: 33.4 g/dL (ref 30.0–36.0)
MCV: 97.8 fL (ref 80.0–100.0)
Monocytes Absolute: 0.4 K/uL (ref 0.1–1.0)
Monocytes Relative: 10 %
Neutro Abs: 1.8 K/uL (ref 1.7–7.7)
Neutrophils Relative %: 49 %
Platelet Count: 180 K/uL (ref 150–400)
RBC: 3.24 MIL/uL — ABNORMAL LOW (ref 3.87–5.11)
RDW: 14.5 % (ref 11.5–15.5)
WBC Count: 3.6 K/uL — ABNORMAL LOW (ref 4.0–10.5)
nRBC: 0 % (ref 0.0–0.2)

## 2024-10-19 MED ORDER — SODIUM CHLORIDE 0.9 % IV SOLN
1.0000 mg/kg | Freq: Once | INTRAVENOUS | Status: AC
  Administered 2024-10-19: 50 mg via INTRAVENOUS
  Filled 2024-10-19: qty 3

## 2024-10-19 MED ORDER — FOLIC ACID 1 MG PO TABS: 2.0000 mg | ORAL_TABLET | Freq: Every day | ORAL | 3 refills | Status: AC

## 2024-10-19 MED ORDER — SODIUM CHLORIDE 0.9 % IV SOLN
INTRAVENOUS | Status: DC
  Filled 2024-10-19 (×2): qty 250

## 2024-10-19 MED ORDER — LEVOTHYROXINE SODIUM 50 MCG PO TABS: 50.0000 ug | ORAL_TABLET | Freq: Every day | ORAL | 3 refills | Status: AC

## 2024-10-19 MED ORDER — PROCHLORPERAZINE MALEATE 10 MG PO TABS
10.0000 mg | ORAL_TABLET | Freq: Once | ORAL | Status: AC
  Administered 2024-10-19: 10 mg via ORAL
  Filled 2024-10-19: qty 1

## 2024-10-19 MED ORDER — SODIUM CHLORIDE 0.9 % IV SOLN
200.0000 mg | Freq: Once | INTRAVENOUS | Status: AC
  Administered 2024-10-19: 200 mg via INTRAVENOUS
  Filled 2024-10-19: qty 200

## 2024-10-19 NOTE — Patient Instructions (Signed)
 [facility] - A DEPT OF [facility]  Discharge Instructions: Thank you for choosing [facility] to provide your oncology and hematology care.  If you have a lab appointment with the Cancer Center, please go directly to the Cancer Center and check in at the registration area.  Wear comfortable clothing and clothing appropriate for easy access to any Portacath or PICC line.   We strive to give you quality time with your provider. You may need to reschedule your appointment if you arrive late (15 or more minutes).  Arriving late affects you and other patients whose appointments are after yours.  Also, if you miss three or more appointments without notifying the office, you may be dismissed from the clinic at the providers discretion.      For prescription refill requests, have your pharmacy contact our office and allow 72 hours for refills to be completed.    Today you received the following chemotherapy and/or immunotherapy agents- padcev , keutruda      To help prevent nausea and vomiting after your treatment, we encourage you to take your nausea medication as directed.  BELOW ARE SYMPTOMS THAT SHOULD BE REPORTED IMMEDIATELY: *FEVER GREATER THAN 100.4 F (38 C) OR HIGHER *CHILLS OR SWEATING *NAUSEA AND VOMITING THAT IS NOT CONTROLLED WITH YOUR NAUSEA MEDICATION *UNUSUAL SHORTNESS OF BREATH *UNUSUAL BRUISING OR BLEEDING *URINARY PROBLEMS (pain or burning when urinating, or frequent urination) *BOWEL PROBLEMS (unusual diarrhea, constipation, pain near the anus) TENDERNESS IN MOUTH AND THROAT WITH OR WITHOUT PRESENCE OF ULCERS (sore throat, sores in mouth, or a toothache) UNUSUAL RASH, SWELLING OR PAIN  UNUSUAL VAGINAL DISCHARGE OR ITCHING   Items with * indicate a potential emergency and should be followed up as soon as possible or go to the Emergency Department if any problems should occur.  Please show the CHEMOTHERAPY ALERT CARD or  IMMUNOTHERAPY ALERT CARD at check-in to the Emergency Department and triage nurse.  Should you have questions after your visit or need to cancel or reschedule your appointment, please contact [facility] - A DEPT OF [facility]  [phone] and follow the prompts.  Office hours are 8:00 a.m. to 4:30 p.m. Monday - Friday. Please note that voicemails left after 4:00 p.m. may not be returned until the following business day.  We are closed weekends and major holidays. You have access to a nurse at all times for urgent questions. Please call the main number to the clinic [phone] and follow the prompts.  For any non-urgent questions, you may also contact your provider using MyChart. We now offer e-Visits for anyone 18 and older to request care online for non-urgent symptoms. For details visit [email or web address].   Also download the MyChart app! Go to the app store, search MyChart, open the app, select [facility], and log in with your MyChart username and password.

## 2024-10-19 NOTE — Progress Notes (Signed)
 C/o abd/pelvic pain 4/10.

## 2024-10-19 NOTE — Progress Notes (Signed)
 Barbara [name]  Patient Care Team: Barbara [name], Barbara [name] as PCP - General (Physician Assistant) Barbara Cindy SAUNDERS, MD as Consulting Physician (Oncology)  CHIEF COMPLAINTS/PURPOSE OF CONSULTATION: Bladder cancer  Oncology History Overview [name]  # Metastatic urothelial carcinoma - Patient developed urinary symptoms around September or October 2023.  Was treated with multiple rounds of antibiotics with no improvement.  Barbara [name] was then referred to Dr. Penne for further evaluation.   - s/p TURBT with left ureteral stent placement by Dr. Penne.  IntraOp findings showed large irregular mucosal submucosal extensive tumor from the left dome, left lateral bladder wall majority of trigone including the left UO and bladder neck.  Measures at least 5 x 5 cm. Pathology showed invasive high-grade urothelial carcinoma. Invades muscularis propria.   - Completed neoadjuvant cycle 4-day 1 of split dose cisplatin  and gemcitabine  (07/23/2023). Pt declined C4D8 due to poor tolerance/nausea/flank pain.  Split dose cisplatin  considered for CKD stage III.   - s/p robotic adhesiolysis + peritoneal biopsies 09/25/23 showed urothelial cancer with peritoneal carcinomatosis and small + large bowel serosal involvement. Original plan was cystectomy and urinary diversion but this was aborted based on intra-op findings.   # JAN 14th, 2025- Start Pem+Erfo   Urothelial cancer (HCC)  04/12/2023 Initial Diagnosis   Urothelial cancer (HCC)   04/12/2023 Cancer Staging   Staging form: Kidney, AJCC 8th Edition - Clinical: Stage III (cT3, cN0, cM0) - Signed by Agrawal, Kavita, MD on 05/07/2023 Stage prefix: Initial diagnosis   09/25/2023 Cancer Staging   Staging form: Kidney, AJCC 8th Edition - Pathologic stage from 09/25/2023: Stage IV (pM1) - Signed by Clista Bimler, MD on 10/25/2023   Malignant neoplasm of urinary bladder (HCC)  04/22/2023 Initial Diagnosis   Malignant neoplasm of urinary bladder  (HCC)   05/07/2023 - 07/23/2023 Chemotherapy   Patient is on Treatment Plan : BLADDER Gemcitabine  D1,8 + Cisplatin  (split dose) D1,8 q21d x 4 cycles     11/05/2023 -  Chemotherapy   Patient is on Treatment Plan : UROTHELIAL ADVANCED, METASTATIC ENFORTUMAB D1, D8 + PEMBROLIZUMAB  (200) D1 Q21D       Patient ambulating-independently.   Alone.   # 63 year old female patient with a history of metastatic urothelial carcinoma currently on pembrolizumab  plus Enfor is here for follow-up.   Discussed the use of AI scribe software for clinical [name] transcription with the patient, who gave verbal consent to proceed.  History of Present Illness    C/o abd/pelvic pain 4/10  Barbara [name] is a 63 year old female with bladder cancer involving lymph nodes who presents for follow-up and continuation of chemotherapy.  Barbara [name] is undergoing chemotherapy for bladder cancer with lymph node involvement. Barbara [name] describes intermittent abdominal pain, localized to a small area, consistent with prior episodes. Barbara [name] discussed a small area of lymphadenopathy in the left pelvis. Barbara [name] is aware of previously noted small lymph nodes in the chest.  Barbara [name] continues to experience persistent hematuria, describing it as significant blood in the urine since her cancer diagnosis. The hematuria was previously light pink but is now more pronounced. Barbara [name] denies hematuria prior to her cancer diagnosis.  Barbara [name] takes Synthroid  regularly for hypothyroidism and requested a prescription refill due to a change in pharmacy. Barbara [name] denies symptoms of hypothyroidism.  Recent laboratory evaluation revealed low folic acid  levels despite adequate dietary intake.       Review of Systems  Constitutional:  Positive for malaise/fatigue. Negative for chills, diaphoresis, fever and weight loss.  HENT:  Negative for nosebleeds and sore throat.   Eyes:  Negative for double vision.  Respiratory:  Negative for cough, hemoptysis, sputum production, shortness of  breath and wheezing.   Cardiovascular:  Negative for chest pain, palpitations, orthopnea and leg swelling.  Gastrointestinal:  Negative for abdominal pain, blood in stool, constipation, diarrhea, heartburn, melena, nausea and vomiting.  Genitourinary:  Negative for dysuria, frequency and urgency.  Musculoskeletal:  Negative for back pain and joint pain.  Skin:  Positive for itching and rash.  Neurological:  Negative for dizziness, tingling, focal weakness, weakness and headaches.  Endo/Heme/Allergies:  Does not bruise/bleed easily.  Psychiatric/Behavioral:  Negative for depression. The patient is not nervous/anxious and does not have insomnia.     MEDICAL HISTORY:  Past Medical History:  Diagnosis Date   Adopted    Allergic rhinitis    Anemia    h/o   Anxiety    Arthritis    B12 deficiency    Bilateral carotid artery stenosis    Bilateral carotid bruits    Bladder tumor    Cancer (HCC)    bladder cancer   Depression    Gallbladder polyp    GERD (gastroesophageal reflux disease)    Heart murmur    Hydronephrosis of left kidney    Hypothyroidism    Migraines    Tobacco abuse    UTI (urinary tract infection)     SURGICAL HISTORY: Past Surgical History:  Procedure Laterality Date   ABDOMINAL HYSTERECTOMY     BLADDER SURGERY     BREAST BIOPSY Left 2014   neg   CYSTOSCOPY W/ RETROGRADES Bilateral 01/29/2024   Procedure: CYSTOSCOPY, WITH RETROGRADE PYELOGRAM;  Surgeon: Alvaro Ricardo KATHEE Mickey., MD;  Location: [facility];  Service: Urology;  Laterality: Bilateral;   CYSTOSCOPY W/ RETROGRADES Left 08/12/2024   Procedure: CYSTOSCOPY, WITH RETROGRADE PYELOGRAM;  Surgeon: Alvaro Ricardo KATHEE Mickey., MD;  Location: [facility];  Service: Urology;  Laterality: Left;   CYSTOSCOPY W/ URETERAL STENT PLACEMENT Left 04/04/2023   Procedure: CYSTOSCOPY WITH RETROGRADE PYELOGRAM/URETERAL STENT PLACEMENT;  Surgeon: Penne Knee, MD;  Location: [facility];  Service: Urology;  Laterality: Left;   CYSTOSCOPY  W/ URETERAL STENT PLACEMENT Left 08/12/2024   Procedure: STENT EXCHANGE;  Surgeon: Alvaro Ricardo KATHEE Mickey., MD;  Location: [facility];  Service: Urology;  Laterality: Left;   CYSTOSCOPY WITH STENT PLACEMENT Left 01/29/2024   Procedure: CYSTOSCOPY, WITH STENT INSERTION;  Surgeon: Alvaro Ricardo KATHEE Mickey., MD;  Location: [facility];  Service: Urology;  Laterality: Left;   FOOT SURGERY Right    2009   IR IMAGING GUIDED PORT INSERTION  05/03/2023   LAPAROSCOPIC APPENDECTOMY  12/05/2012   NASAL SEPTUM SURGERY     ROBOT ASSISTED LAPAROSCOPIC COMPLETE CYSTECT ILEAL CONDUIT N/A 09/25/2023   Procedure: XI ROBOTIC ASSISTED LAPAROSCOPIC EXTENSIVE LYSIS OF ADHESIONS, PERITONEAL BIOPSIES;  Surgeon: Alvaro Ricardo KATHEE Mickey., MD;  Location: [facility];  Service: Urology;  Laterality: N/A;   SISTRUNK PROCEDURE  03/03/2012   Thyroglossal duct cyst excision   TONSILLECTOMY     TRANSURETHRAL RESECTION OF BLADDER TUMOR N/A 04/04/2023   Procedure: TRANSURETHRAL RESECTION OF BLADDER TUMOR (TURBT);  Surgeon: Penne Knee, MD;  Location: [facility];  Service: Urology;  Laterality: N/A;   TRANSURETHRAL RESECTION OF BLADDER TUMOR N/A 08/12/2024   Procedure: TURBT (TRANSURETHRAL RESECTION OF BLADDER TUMOR);  Surgeon: Alvaro Ricardo KATHEE Mickey., MD;  Location: [facility];  Service: Urology;  Laterality: N/A;    SOCIAL HISTORY: Social History   Socioeconomic History  Marital status: Married    Spouse name: Not on file   Number of children: Not on file   Years of education: Not on file   Highest education level: Not on file  Occupational History   Not on file  Tobacco Use   Smoking status: Former    Current packs/day: 0.25    Average packs/day: 0.3 packs/day for 30.0 years (7.5 ttl pk-yrs)    Types: Cigarettes    Passive exposure: Past   Smokeless tobacco: Never  Vaping Use   Vaping status: Never Used  Substance and Sexual Activity   Alcohol  use: Never   Drug use: Never   Sexual activity: Not on file  Other Topics Concern   Not  on file  Social History Narrative   Not on file   Social Drivers of Health   Tobacco Use: Medium Risk (10/19/2024)   Patient History    Smoking Tobacco Use: Former    Smokeless Tobacco Use: Never    Passive Exposure: Past  Physicist, Medical Strain: Not on file  Food Insecurity: No Food Insecurity (09/25/2023)   Hunger Vital Sign    Worried About Running Out of Food in the Last Year: Never true    Ran Out of Food in the Last Year: Never true  Transportation Needs: No Transportation Needs (09/25/2023)   PRAPARE - Administrator, Civil Service (Medical): No    Lack of Transportation (Non-Medical): No  Physical Activity: Not on file  Stress: Not on file  Social Connections: Not on file  Intimate Partner Violence: Not At Risk (09/25/2023)   Humiliation, Afraid, Rape, and Kick questionnaire    Fear of Current or Ex-Partner: No    Emotionally Abused: No    Physically Abused: No    Sexually Abused: No  Depression (PHQ2-9): Medium Risk (10/19/2024)   Depression (PHQ2-9)    PHQ-2 Score: 5  Alcohol  Screen: Not on file  Housing: Low Risk (09/25/2023)   Housing    Last Housing Risk Score: 0  Utilities: Not At Risk (09/25/2023)   AHC Utilities    Threatened with loss of utilities: No  Health Literacy: Not on file    FAMILY HISTORY: Family History  Adopted: Yes    ALLERGIES:  is allergic to pseudoephedrine hcl.  MEDICATIONS:  Current Outpatient Medications  Medication Sig Dispense Refill   carboxymethylcellulose (REFRESH PLUS) 0.5 % SOLN Place 1 drop into both eyes 3 (three) times daily as needed (dry eyes).     escitalopram  (LEXAPRO ) 5 MG tablet Take 1 tablet (5 mg total) by mouth at bedtime. 90 tablet 3   estradiol  (ESTRACE ) 1 MG tablet TAKE 1 TABLET BY MOUTH DAILY (Patient taking differently: Take 1 mg by mouth at bedtime.) 90 tablet 3   famotidine  (PEPCID ) 20 MG tablet Take 20 mg by mouth at bedtime.     folic acid  (FOLVITE ) 1 MG tablet Take 2 tablets (2 mg total)  by mouth daily. 60 tablet 3   prochlorperazine  (COMPAZINE ) 10 MG tablet TAKE 1 TABLET BY MOUTH EVERY 6 HOURS AS NEEDED FOR NAUSEA OR VOMITING. 90 tablet 1   topiramate  (TOPAMAX ) 50 MG tablet TAKE 2 TABLETS BY MOUTH AT  BEDTIME (Patient taking differently: Take 50-100 mg by mouth at bedtime.) 180 tablet 3   traMADol  (ULTRAM ) 50 MG tablet Take 1-2 tablets (50-100 mg total) by mouth every 6 (six) hours as needed for moderate pain (pain score 4-6) or severe pain (pain score 7-10) (from bladder cancer). 30 tablet 0  levothyroxine  (SYNTHROID ) 50 MCG tablet Take 1 tablet (50 mcg total) by mouth daily before breakfast. Do not eat or drink until 30 mins- 1 hour after taking the pill. 30 tablet 3   No current facility-administered medications for this visit.   Facility-Administered Medications Ordered in Other Visits  Medication Dose Route Frequency Provider Last Rate Last Admin   0.9 %  sodium chloride  infusion   Intravenous Continuous Olivier Frayre R, MD 10 mL/hr at 10/19/24 1442 New Bag at 10/19/24 1442   enfortumab vedotin -ejfv (PADCEV ) 50 mg in sodium chloride  0.9 % 50 mL (0.9091 mg/mL) chemo infusion  1 mg/kg (Treatment Plan Recorded) Intravenous Once Karita Dralle R, MD       heparin  lock flush 100 unit/mL  500 Units Intravenous Once Agrawal, Kavita, MD       heparin  lock flush 100 unit/mL  500 Units Intravenous Once Agrawal, Kavita, MD       heparin  lock flush 100 unit/mL  500 Units Intravenous Once Agrawal, Kavita, MD       pembrolizumab  (KEYTRUDA ) 200 mg in sodium chloride  0.9 % 50 mL chemo infusion  200 mg Intravenous Once Santiago Stenzel R, MD       sodium chloride  flush (NS) 0.9 % injection 10 mL  10 mL Intravenous Once Agrawal, Kavita, MD       sodium chloride  flush (NS) 0.9 % injection 10 mL  10 mL Intravenous Once Agrawal, Kavita, MD       sodium chloride  flush (NS) 0.9 % injection 10 mL  10 mL Intravenous Once Agrawal, Kavita, MD        PHYSICAL EXAMINATION:   Vitals:    10/19/24 1309  BP: 130/73  Pulse: 84  Resp: 16  Temp: (!) 97.5 F (36.4 C)  SpO2: 100%    Filed Weights   10/19/24 1309  Weight: 97 lb 14.4 oz (44.4 kg)     Physical Exam Vitals and nursing [name] reviewed.  HENT:     Head: Normocephalic and atraumatic.     Mouth/Throat:     Pharynx: Oropharynx is clear.  Eyes:     Extraocular Movements: Extraocular movements intact.     Pupils: Pupils are equal, round, and reactive to light.  Cardiovascular:     Rate and Rhythm: Normal rate and regular rhythm.  Pulmonary:     Comments: Decreased breath sounds bilaterally.  Abdominal:     Palpations: Abdomen is soft.  Musculoskeletal:        General: Normal range of motion.     Cervical back: Normal range of motion.  Skin:    General: Skin is warm.  Neurological:     General: No focal deficit present.     Mental Status: Barbara [name] is alert and oriented to person, place, and time.  Psychiatric:        Behavior: Behavior normal.        Judgment: Judgment normal.     LABORATORY DATA:  I have reviewed the data as listed Lab Results  Component Value Date   WBC 3.6 (L) 10/19/2024   HGB 10.6 (L) 10/19/2024   HCT 31.7 (L) 10/19/2024   MCV 97.8 10/19/2024   PLT 180 10/19/2024   Recent Labs    09/23/24 0843 09/30/24 1348 10/19/24 1334  NA 141 137 139  K 3.5 3.2* 3.2*  CL 106 103 105  CO2 25 21* 22  GLUCOSE 89 158* 146*  BUN 15 17 15   CREATININE 1.47* 1.54* 1.51*  CALCIUM  9.2 8.9 8.9  GFRNONAA 40* 38* 38*  PROT 6.9 7.1 6.8  ALBUMIN 3.7 3.8 3.8  AST 22 24 21   ALT 6 7 <5  ALKPHOS 60 64 61  BILITOT 0.4 0.5 0.3    RADIOGRAPHIC STUDIES: I have personally reviewed the radiological images as listed and agreed with the findings in the report. No results found.     Malignant neoplasm of urinary bladder (HCC) # Stage/metastatic urothelial cancer; [facility] testing was sent on peritoneal biopsies - no targets currently on Pembro+ Erfortumab-; # PET NOV 21- 2025-.  Mild progression  of pelvic nodal metastasis;  No extrapelvic metastatic disease identified.   #  Currently on proceed with Pem+Enfor 1mg /kg- day1 today. Labs-CBC/chemistries were reviewed with the patient.   # Anemia- multifactorial- low folic acid - called in a script- monitor for now.   # Renal/Hypokalemia- -Potassium 3.2 .   supp- potassium rich food.  Continue monitoring.  CKD stage III - Increase fluid intake- stable.  # Elevated TSH but normal- T4- discussed with patient- will monitor for now.  Hypothyroidism sec to keytrua- [AUG 2025]-Continue with Synthroid  50 mcg/day; called in new script.     # Weight loss: -Continue to follow with nutrition. Declines Zyprexa -  stable.   # Maculopapular rash: Likely secondary to enfortumab vedotin - Initially, started with grade 3 involving more than 30% BSA with severe pruritus, mild fever.  Significantly improved after dose reduction. Continue with supportive care-Claritin, Aveeno moisturizer and Singulair ; Kenalog  0.5% twice daily as needed to the affected areas- stable.  # Mild lower abdominal pain- sec to malignancy- recommend tramadol  as needed. Stable.    # Hematuria: [Dr. 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  Continue follow-up with Dr. Alvaro- again in 6-9 months.   stable.   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   PS- # DISPOSITION: # chemo today-  # as per IS- in 1 week-chemo/labs- # as per IS  on 1/19 - MD; labs-cbc/cmp; Thyroid  profile-;chemo; day -8-labs- chemo Dr.B   Above plan of care was discussed with patient/family in detail.  My contact information was given to the patient/family.      Cindy JONELLE Joe, MD 10/19/2024 2:49  PM

## 2024-10-19 NOTE — Assessment & Plan Note (Addendum)
#   Stage/metastatic urothelial cancer; Tempus testing was sent on peritoneal biopsies - no targets currently on Pembro+ Erfortumab-; # PET NOV 21- 2025-.  Mild progression of pelvic nodal metastasis;  No extrapelvic metastatic disease identified.   #  Currently on proceed with Pem+Enfor 1mg /kg- day1 today. Labs-CBC/chemistries were reviewed with the patient.   # Anemia- multifactorial- low folic acid - called in a script- monitor for now.   # Renal/Hypokalemia- -Potassium 3.2 .   supp- potassium rich food.  Continue monitoring.  CKD stage III - Increase fluid intake- stable.  # Elevated TSH but normal- T4- discussed with patient- will monitor for now.  Hypothyroidism sec to fransico SEED 2025]-Continue with Synthroid  50 mcg/day; called in new script.     # Weight loss: -Continue to follow with nutrition. Declines Zyprexa -  stable.   # Maculopapular rash: Likely secondary to enfortumab vedotin - Initially, started with grade 3 involving more than 30% BSA with severe pruritus, mild fever.  Significantly improved after dose reduction. Continue with supportive care-Claritin, Aveeno moisturizer and Singulair ; Kenalog  0.5% twice daily as needed to the affected areas- stable.  # Mild lower abdominal pain- sec to malignancy- recommend tramadol  as needed. Stable.    # Hematuria: [Dr. 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  Continue follow-up with Dr. Alvaro- again in 6-9 months.   stable.   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   PS- # DISPOSITION: # chemo today-  # as per IS- in 1 week-chemo/labs- # as per IS  on 1/19 - MD; labs-cbc/cmp; Thyroid  profile-;chemo; day -8-labs- chemo Dr.B

## 2024-10-23 ENCOUNTER — Ambulatory Visit: Payer: Self-pay | Admitting: Physician Assistant

## 2024-10-26 ENCOUNTER — Inpatient Hospital Stay

## 2024-10-26 ENCOUNTER — Inpatient Hospital Stay: Attending: Internal Medicine

## 2024-10-26 VITALS — BP 121/66 | HR 70 | Temp 97.0°F | Resp 18

## 2024-10-26 DIAGNOSIS — C679 Malignant neoplasm of bladder, unspecified: Secondary | ICD-10-CM | POA: Diagnosis present

## 2024-10-26 DIAGNOSIS — Z79899 Other long term (current) drug therapy: Secondary | ICD-10-CM | POA: Diagnosis not present

## 2024-10-26 DIAGNOSIS — G893 Neoplasm related pain (acute) (chronic): Secondary | ICD-10-CM | POA: Insufficient documentation

## 2024-10-26 DIAGNOSIS — Z79620 Long term (current) use of immunosuppressive biologic: Secondary | ICD-10-CM | POA: Diagnosis not present

## 2024-10-26 DIAGNOSIS — C775 Secondary and unspecified malignant neoplasm of intrapelvic lymph nodes: Secondary | ICD-10-CM | POA: Insufficient documentation

## 2024-10-26 DIAGNOSIS — Z5112 Encounter for antineoplastic immunotherapy: Secondary | ICD-10-CM | POA: Diagnosis present

## 2024-10-26 DIAGNOSIS — Z87891 Personal history of nicotine dependence: Secondary | ICD-10-CM | POA: Diagnosis not present

## 2024-10-26 LAB — CBC WITH DIFFERENTIAL (CANCER CENTER ONLY)
Abs Immature Granulocytes: 0.02 K/uL (ref 0.00–0.07)
Basophils Absolute: 0 K/uL (ref 0.0–0.1)
Basophils Relative: 1 %
Eosinophils Absolute: 0.4 K/uL (ref 0.0–0.5)
Eosinophils Relative: 9 %
HCT: 32.6 % — ABNORMAL LOW (ref 36.0–46.0)
Hemoglobin: 11 g/dL — ABNORMAL LOW (ref 12.0–15.0)
Immature Granulocytes: 0 %
Lymphocytes Relative: 30 %
Lymphs Abs: 1.5 K/uL (ref 0.7–4.0)
MCH: 32.4 pg (ref 26.0–34.0)
MCHC: 33.7 g/dL (ref 30.0–36.0)
MCV: 96.2 fL (ref 80.0–100.0)
Monocytes Absolute: 0.4 K/uL (ref 0.1–1.0)
Monocytes Relative: 8 %
Neutro Abs: 2.6 K/uL (ref 1.7–7.7)
Neutrophils Relative %: 52 %
Platelet Count: 190 K/uL (ref 150–400)
RBC: 3.39 MIL/uL — ABNORMAL LOW (ref 3.87–5.11)
RDW: 14.2 % (ref 11.5–15.5)
WBC Count: 4.9 K/uL (ref 4.0–10.5)
nRBC: 0 % (ref 0.0–0.2)

## 2024-10-26 LAB — CMP (CANCER CENTER ONLY)
ALT: <5 U/L (ref 0–44)
AST: 24 U/L (ref 15–41)
Albumin: 3.7 g/dL (ref 3.5–5.0)
Alkaline Phosphatase: 66 U/L (ref 38–126)
Anion gap: 11 (ref 5–15)
BUN: 14 mg/dL (ref 8–23)
CO2: 22 mmol/L (ref 22–32)
Calcium: 8.9 mg/dL (ref 8.9–10.3)
Chloride: 102 mmol/L (ref 98–111)
Creatinine: 1.5 mg/dL — ABNORMAL HIGH (ref 0.44–1.00)
GFR, Estimated: 39 mL/min — ABNORMAL LOW
Glucose, Bld: 171 mg/dL — ABNORMAL HIGH (ref 70–99)
Potassium: 3.1 mmol/L — ABNORMAL LOW (ref 3.5–5.1)
Sodium: 135 mmol/L (ref 135–145)
Total Bilirubin: 0.4 mg/dL (ref 0.0–1.2)
Total Protein: 7.1 g/dL (ref 6.5–8.1)

## 2024-10-26 MED ORDER — PROCHLORPERAZINE MALEATE 10 MG PO TABS
10.0000 mg | ORAL_TABLET | Freq: Once | ORAL | Status: AC
  Administered 2024-10-26: 10 mg via ORAL
  Filled 2024-10-26: qty 1

## 2024-10-26 MED ORDER — SODIUM CHLORIDE 0.9 % IV SOLN
INTRAVENOUS | Status: DC
  Filled 2024-10-26: qty 250

## 2024-10-26 MED ORDER — SODIUM CHLORIDE 0.9 % IV SOLN
1.0000 mg/kg | Freq: Once | INTRAVENOUS | Status: AC
  Administered 2024-10-26: 50 mg via INTRAVENOUS
  Filled 2024-10-26: qty 3

## 2024-10-26 NOTE — Telephone Encounter (Signed)
-----   Message from Tinnie MARLA Pro sent at 10/23/2024 12:37 PM EST ----- Please let her know that her folate is down and needs supplement--looks like oncology already reviewed and ordered this. Ferritin elevated. Cholesterol has improved some. Vit D low and may supplement

## 2024-10-26 NOTE — Telephone Encounter (Signed)
"  Spoke with patient regarding lab results.  "

## 2024-10-28 ENCOUNTER — Inpatient Hospital Stay

## 2024-10-28 NOTE — Progress Notes (Signed)
 Nutrition Follow-up:  Patient with high grade urothelial carcinoma.  Receiving enfortumab and keytruda .  Spoke with patient via phone.  Reports that she has been snacking more and weight increased.  Has made the banana milkshake some.  Tried the oral nutrition supplements and had too much of a vitamin taste and unable to drink them. 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   Has not tried metaqil  Medications: reviewed  Labs: reviewed  Anthropometrics:   Weight 99 lb 3.3 98 lb 1.4 oz on 12/10 99 lb 8 oz on 11/5 112 lb 3.2 oz on 4/1 120 lb 10/21/23   NUTRITION DIAGNOSIS: Inadequate oral intake improving    INTERVENTION:  Continue snacking and increasing calories and protein to promote weight gain     MONITORING, EVALUATION, GOAL: weight trends, intake   NEXT VISIT: Wed, Jan 28 phone call  Barbara [name] SOLON, CSO, LDN Registered Dietitian [phone]

## 2024-11-02 ENCOUNTER — Ambulatory Visit
Admission: RE | Admit: 2024-11-02 | Discharge: 2024-11-02 | Disposition: A | Discharge status: Home / Self Care | Source: Ambulatory Visit | Attending: Physician Assistant | Admitting: Physician Assistant

## 2024-11-02 DIAGNOSIS — Z1231 Encounter for screening mammogram for malignant neoplasm of breast: Secondary | ICD-10-CM | POA: Diagnosis present

## 2024-11-09 ENCOUNTER — Inpatient Hospital Stay

## 2024-11-09 ENCOUNTER — Encounter: Payer: Self-pay | Admitting: Internal Medicine

## 2024-11-09 ENCOUNTER — Inpatient Hospital Stay: Admitting: Internal Medicine

## 2024-11-09 VITALS — BP 136/76 | HR 88 | Temp 97.2°F | Resp 20 | Ht 65.0 in | Wt 95.5 lb

## 2024-11-09 VITALS — BP 134/68 | HR 76

## 2024-11-09 DIAGNOSIS — C679 Malignant neoplasm of bladder, unspecified: Secondary | ICD-10-CM

## 2024-11-09 DIAGNOSIS — G893 Neoplasm related pain (acute) (chronic): Secondary | ICD-10-CM

## 2024-11-09 DIAGNOSIS — Z5112 Encounter for antineoplastic immunotherapy: Secondary | ICD-10-CM | POA: Diagnosis not present

## 2024-11-09 LAB — CBC WITH DIFFERENTIAL (CANCER CENTER ONLY)
Abs Immature Granulocytes: 0.08 K/uL — ABNORMAL HIGH (ref 0.00–0.07)
Basophils Absolute: 0.1 K/uL (ref 0.0–0.1)
Basophils Relative: 1 %
Eosinophils Absolute: 0.3 K/uL (ref 0.0–0.5)
Eosinophils Relative: 5 %
HCT: 33.7 % — ABNORMAL LOW (ref 36.0–46.0)
Hemoglobin: 11.3 g/dL — ABNORMAL LOW (ref 12.0–15.0)
Immature Granulocytes: 1 %
Lymphocytes Relative: 37 %
Lymphs Abs: 2.3 K/uL (ref 0.7–4.0)
MCH: 32.2 pg (ref 26.0–34.0)
MCHC: 33.5 g/dL (ref 30.0–36.0)
MCV: 96 fL (ref 80.0–100.0)
Monocytes Absolute: 0.6 K/uL (ref 0.1–1.0)
Monocytes Relative: 10 %
Neutro Abs: 2.9 K/uL (ref 1.7–7.7)
Neutrophils Relative %: 46 %
Platelet Count: 242 K/uL (ref 150–400)
RBC: 3.51 MIL/uL — ABNORMAL LOW (ref 3.87–5.11)
RDW: 13.8 % (ref 11.5–15.5)
WBC Count: 6.2 K/uL (ref 4.0–10.5)
nRBC: 0 % (ref 0.0–0.2)

## 2024-11-09 LAB — CMP (CANCER CENTER ONLY)
ALT: <5 U/L (ref 0–44)
AST: 24 U/L (ref 15–41)
Albumin: 3.9 g/dL (ref 3.5–5.0)
Alkaline Phosphatase: 66 U/L (ref 38–126)
Anion gap: 12 (ref 5–15)
BUN: 20 mg/dL (ref 8–23)
CO2: 23 mmol/L (ref 22–32)
Calcium: 9.2 mg/dL (ref 8.9–10.3)
Chloride: 102 mmol/L (ref 98–111)
Creatinine: 1.61 mg/dL — ABNORMAL HIGH (ref 0.44–1.00)
GFR, Estimated: 36 mL/min — ABNORMAL LOW
Glucose, Bld: 148 mg/dL — ABNORMAL HIGH (ref 70–99)
Potassium: 3.1 mmol/L — ABNORMAL LOW (ref 3.5–5.1)
Sodium: 137 mmol/L (ref 135–145)
Total Bilirubin: 0.4 mg/dL (ref 0.0–1.2)
Total Protein: 7.5 g/dL (ref 6.5–8.1)

## 2024-11-09 MED ORDER — SODIUM CHLORIDE 0.9 % IV SOLN
INTRAVENOUS | Status: DC
  Filled 2024-11-09 (×2): qty 250

## 2024-11-09 MED ORDER — SODIUM CHLORIDE 0.9 % IV SOLN
1.0000 mg/kg | Freq: Once | INTRAVENOUS | Status: AC
  Administered 2024-11-09: 50 mg via INTRAVENOUS
  Filled 2024-11-09: qty 3

## 2024-11-09 MED ORDER — OXYCODONE-ACETAMINOPHEN 5-325 MG PO TABS: 1.0000 | ORAL_TABLET | Freq: Three times a day (TID) | ORAL | 0 refills | Status: AC | PRN

## 2024-11-09 MED ORDER — DICYCLOMINE HCL 10 MG PO CAPS: 10.0000 mg | ORAL_CAPSULE | Freq: Three times a day (TID) | ORAL | 0 refills | Status: AC

## 2024-11-09 MED ORDER — MORPHINE SULFATE (PF) 4 MG/ML IV SOLN
2.0000 mg | Freq: Once | INTRAVENOUS | Status: AC
  Administered 2024-11-09: 2 mg via INTRAVENOUS
  Filled 2024-11-09: qty 1

## 2024-11-09 MED ORDER — PROCHLORPERAZINE MALEATE 10 MG PO TABS
10.0000 mg | ORAL_TABLET | Freq: Once | ORAL | Status: AC
  Administered 2024-11-09: 10 mg via ORAL
  Filled 2024-11-09: qty 1

## 2024-11-09 MED ORDER — SODIUM CHLORIDE 0.9 % IV SOLN
200.0000 mg | Freq: Once | INTRAVENOUS | Status: AC
  Administered 2024-11-09: 200 mg via INTRAVENOUS
  Filled 2024-11-09: qty 8

## 2024-11-09 MED ORDER — TAMSULOSIN HCL 0.4 MG PO CAPS: 0.4000 mg | ORAL_CAPSULE | Freq: Every day | ORAL | 0 refills | Status: AC

## 2024-11-09 NOTE — Progress Notes (Signed)
 Per Dr. Rennie,  OK to continue with padcev  dose rounding to 50 mg   Tomi Grandpre E Jude Naclerio, PharmD, BCPS Clinical Pharmacist

## 2024-11-09 NOTE — Progress Notes (Signed)
 C/o lower abdominal pain for 3 days, 5/10. She does have tramadol  but hasn't been taking it. She is seeing blood at times.

## 2024-11-09 NOTE — Assessment & Plan Note (Addendum)
#   Stage/metastatic urothelial cancer; Tempus testing was sent on peritoneal biopsies - no targets currently on Pembro+ Erfortumab-; # PET NOV 21- 2025-.  Mild progression of pelvic nodal metastasis;  No extrapelvic metastatic disease identified.   #  Currently on proceed with Pem+Enfor 1mg /kg- day1 today. Labs-CBC/chemistries were reviewed with the patient. Will order scan at next visit-   # Bladder spasms/ # Mild lower abdominal pain- sec to malignancy/ ? Stent- - recommend percocet/bentyl  prn- add flomax . .   # Anemia- multifactorial- low folic acid - called in a script- monitor for now.  stable.  # Renal/Hypokalemia- -Potassium 3.2 .   supp- potassium rich food.  Continue monitoring.  CKD stage III - Increase fluid intake- stable.  # Elevated TSH but normal- T4- discussed with patient- will monitor for now.  Hypothyroidism sec to jerome- [AUG 2025]-Continue with Synthroid  50 mcg/day-  stable.   # Weight loss: -Continue to follow with nutrition. Declines Zyprexa -  stable.   # Maculopapular rash: Likely secondary to enfortumab vedotin - Initially, started with grade 3 involving more than 30% BSA with severe pruritus, mild fever.  Significantly improved after dose reduction. Continue with supportive care-Claritin, Aveeno moisturizer and Singulair ; Kenalog  0.5% twice daily as needed to the affected areas- stable.   # Hematuria: [Dr. 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  Continue follow-up with Dr. Alvaro- again in 6-9 months.   stable.   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   PS- # DISPOSITION: # morphine  2 mg IVP today;  IVFs  500 cc over 30 mins- michelle- please order # chemo today-   # as per IS- in 1 week-chemo/labs- # as per IS     - MD; labs-cbc/cmp; Thyroid  profile-;chemo; day -8-labs- chemo Dr.B

## 2024-11-09 NOTE — Patient Instructions (Addendum)
 [facility] - A DEPT OF [facility]  Discharge Instructions: Thank you for choosing [facility] to provide your oncology and hematology care.  If you have a lab appointment with the Cancer Center, please go directly to the Cancer Center and check in at the registration area.  Wear comfortable clothing and clothing appropriate for easy access to any Portacath or PICC line.   We strive to give you quality time with your provider. You may need to reschedule your appointment if you arrive late (15 or more minutes).  Arriving late affects you and other patients whose appointments are after yours.  Also, if you miss three or more appointments without notifying the office, you may be dismissed from the clinic at the providers discretion.      For prescription refill requests, have your pharmacy contact our office and allow 72 hours for refills to be completed.    Today you received the following chemotherapy and/or immunotherapy agents Padcev , pembrolizumab       To help prevent nausea and vomiting after your treatment, we encourage you to take your nausea medication as directed.  BELOW ARE SYMPTOMS THAT SHOULD BE REPORTED IMMEDIATELY: *FEVER GREATER THAN 100.4 F (38 C) OR HIGHER *CHILLS OR SWEATING *NAUSEA AND VOMITING THAT IS NOT CONTROLLED WITH YOUR NAUSEA MEDICATION *UNUSUAL SHORTNESS OF BREATH *UNUSUAL BRUISING OR BLEEDING *URINARY PROBLEMS (pain or burning when urinating, or frequent urination) *BOWEL PROBLEMS (unusual diarrhea, constipation, pain near the anus) TENDERNESS IN MOUTH AND THROAT WITH OR WITHOUT PRESENCE OF ULCERS (sore throat, sores in mouth, or a toothache) UNUSUAL RASH, SWELLING OR PAIN  UNUSUAL VAGINAL DISCHARGE OR ITCHING   Items with * indicate a potential emergency and should be followed up as soon as possible or go to the Emergency Department if any problems should occur.  Please show the CHEMOTHERAPY ALERT CARD or  IMMUNOTHERAPY ALERT CARD at check-in to the Emergency Department and triage nurse.  Should you have questions after your visit or need to cancel or reschedule your appointment, please contact [facility] - A DEPT OF [facility]  [phone] and follow the prompts.  Office hours are 8:00 a.m. to 4:30 p.m. Monday - Friday. Please note that voicemails left after 4:00 p.m. may not be returned until the following business day.  We are closed weekends and major holidays. You have access to a nurse at all times for urgent questions. Please call the main number to the clinic [phone] and follow the prompts.  For any non-urgent questions, you may also contact your provider using MyChart. We now offer e-Visits for anyone 50 and older to request care online for non-urgent symptoms. For details visit [email or web address].   Also download the MyChart app! Go to the app store, search MyChart, open the app, select [place], and log in with your MyChart username and password.

## 2024-11-09 NOTE — Progress Notes (Signed)
 [facility] CONSULT NOTE  Patient Care Team: McDonough, Tinnie [name] as PCP - General (Physician Assistant) Rennie Cindy SAUNDERS, MD as Consulting Physician (Oncology)  CHIEF COMPLAINTS/PURPOSE OF CONSULTATION: Bladder cancer  Oncology History Overview Note  # Metastatic urothelial carcinoma - Patient developed urinary symptoms around September or October 2023.  Was treated with multiple rounds of antibiotics with no improvement.  She was then referred to Dr. Penne for further evaluation.   - s/p TURBT with left ureteral stent placement by Dr. Penne.  IntraOp findings showed large irregular mucosal submucosal extensive tumor from the left dome, left lateral bladder wall majority of trigone including the left UO and bladder neck.  Measures at least 5 x 5 cm. Pathology showed invasive high-grade urothelial carcinoma. Invades muscularis propria.   - Completed neoadjuvant cycle 4-day 1 of split dose cisplatin  and gemcitabine  (07/23/2023). Pt declined C4D8 due to poor tolerance/nausea/flank pain.  Split dose cisplatin  considered for CKD stage III.   - s/p robotic adhesiolysis + peritoneal biopsies 09/25/23 showed urothelial cancer with peritoneal carcinomatosis and small + large bowel serosal involvement. Original plan was cystectomy and urinary diversion but this was aborted based on intra-op findings.   # JAN 14th, 2025- Start Pem+Erfo   Urothelial cancer (HCC)  04/12/2023 Initial Diagnosis   Urothelial cancer (HCC)   04/12/2023 Cancer Staging   Staging form: Kidney, AJCC 8th Edition - Clinical: Stage III (cT3, cN0, cM0) - Signed by Agrawal, Kavita, MD on 05/07/2023 Stage prefix: Initial diagnosis   09/25/2023 Cancer Staging   Staging form: Kidney, AJCC 8th Edition - Pathologic stage from 09/25/2023: Stage IV (pM1) - Signed by Clista Bimler, MD on 10/25/2023   Malignant neoplasm of urinary bladder (HCC)  04/22/2023 Initial Diagnosis   Malignant neoplasm of urinary bladder  (HCC)   05/07/2023 - 07/23/2023 Chemotherapy   Patient is on Treatment Plan : BLADDER Gemcitabine  D1,8 + Cisplatin  (split dose) D1,8 q21d x 4 cycles     11/05/2023 -  Chemotherapy   Patient is on Treatment Plan : UROTHELIAL ADVANCED, METASTATIC ENFORTUMAB D1, D8 + PEMBROLIZUMAB  (200) D1 Q21D       Patient ambulating-independently.   Alone.   # 64 year old female patient with a history of metastatic urothelial carcinoma currently on pembrolizumab  plus Enfor is here for follow-up.   Discussed the use of AI scribe software for clinical note transcription with the patient, who gave verbal consent to proceed.  History of Present Illness     HAIDY KACKLEY is a 64 year old female with metastatic urethral/bladder cancer and urinary stent who presents with severe bladder pain and spasms.  She has experienced severe cramping and pain localized to the bladder for the past three days, primarily affecting the lower back and lower abdomen without radiation. The pain is described as severe and associated with nausea, but without vomiting, diarrhea, hematuria, hematochezia, or passage of clots. She is maintaining adequate fluid intake.  She has a urinary stent in place. There is a history of prior emergency department visit for similar bladder spasms, during which she received immediate pain relief and was prescribed dicyclomine  and Percocet. She has not used any medication for the current episode prior to this visit.  Current medications include thyroid  medication, vitamin D , and folic acid , all taken as prescribed. She denies use of morphine  for pain. She is not driving herself today and has arranged transportation to and from the clinic.       Review of Systems  Constitutional:  Positive for  malaise/fatigue. Negative for chills, diaphoresis, fever and weight loss.  HENT:  Negative for nosebleeds and sore throat.   Eyes:  Negative for double vision.  Respiratory:  Negative for cough, hemoptysis,  sputum production, shortness of breath and wheezing.   Cardiovascular:  Negative for chest pain, palpitations, orthopnea and leg swelling.  Gastrointestinal:  Positive for abdominal pain and nausea. Negative for blood in stool, constipation, diarrhea, heartburn, melena and vomiting.  Genitourinary:  Negative for dysuria, frequency and urgency.  Musculoskeletal:  Negative for back pain and joint pain.  Skin:  Positive for itching and rash.  Neurological:  Negative for dizziness, tingling, focal weakness, weakness and headaches.  Endo/Heme/Allergies:  Does not bruise/bleed easily.  Psychiatric/Behavioral:  Negative for depression. The patient is not nervous/anxious and does not have insomnia.     MEDICAL HISTORY:  Past Medical History:  Diagnosis Date   Adopted    Allergic rhinitis    Anemia    h/o   Anxiety    Arthritis    B12 deficiency    Bilateral carotid artery stenosis    Bilateral carotid bruits    Bladder tumor    Cancer (HCC)    bladder cancer   Depression    Gallbladder polyp    GERD (gastroesophageal reflux disease)    Heart murmur    Hydronephrosis of left kidney    Hypothyroidism    Migraines    Tobacco abuse    UTI (urinary tract infection)     SURGICAL HISTORY: Past Surgical History:  Procedure Laterality Date   ABDOMINAL HYSTERECTOMY     BLADDER SURGERY     BREAST BIOPSY Left 2014   neg   CYSTOSCOPY W/ RETROGRADES Bilateral 01/29/2024   Procedure: CYSTOSCOPY, WITH RETROGRADE PYELOGRAM;  Surgeon: Alvaro Ricardo KATHEE Mickey., MD;  Location: [facility];  Service: Urology;  Laterality: Bilateral;   CYSTOSCOPY W/ RETROGRADES Left 08/12/2024   Procedure: CYSTOSCOPY, WITH RETROGRADE PYELOGRAM;  Surgeon: Alvaro Ricardo KATHEE Mickey., MD;  Location: [facility];  Service: Urology;  Laterality: Left;   CYSTOSCOPY W/ URETERAL STENT PLACEMENT Left 04/04/2023   Procedure: CYSTOSCOPY WITH RETROGRADE PYELOGRAM/URETERAL STENT PLACEMENT;  Surgeon: Penne Knee, MD;  Location: [facility];   Service: Urology;  Laterality: Left;   CYSTOSCOPY W/ URETERAL STENT PLACEMENT Left 08/12/2024   Procedure: STENT EXCHANGE;  Surgeon: Alvaro Ricardo KATHEE Mickey., MD;  Location: [facility];  Service: Urology;  Laterality: Left;   CYSTOSCOPY WITH STENT PLACEMENT Left 01/29/2024   Procedure: CYSTOSCOPY, WITH STENT INSERTION;  Surgeon: Alvaro Ricardo KATHEE Mickey., MD;  Location: [facility];  Service: Urology;  Laterality: Left;   FOOT SURGERY Right    2009   IR IMAGING GUIDED PORT INSERTION  05/03/2023   LAPAROSCOPIC APPENDECTOMY  12/05/2012   NASAL SEPTUM SURGERY     ROBOT ASSISTED LAPAROSCOPIC COMPLETE CYSTECT ILEAL CONDUIT N/A 09/25/2023   Procedure: XI ROBOTIC ASSISTED LAPAROSCOPIC EXTENSIVE LYSIS OF ADHESIONS, PERITONEAL BIOPSIES;  Surgeon: Alvaro Ricardo KATHEE Mickey., MD;  Location: [facility];  Service: Urology;  Laterality: N/A;   SISTRUNK PROCEDURE  03/03/2012   Thyroglossal duct cyst excision   TONSILLECTOMY     TRANSURETHRAL RESECTION OF BLADDER TUMOR N/A 04/04/2023   Procedure: TRANSURETHRAL RESECTION OF BLADDER TUMOR (TURBT);  Surgeon: Penne Knee, MD;  Location: [facility];  Service: Urology;  Laterality: N/A;   TRANSURETHRAL RESECTION OF BLADDER TUMOR N/A 08/12/2024   Procedure: TURBT (TRANSURETHRAL RESECTION OF BLADDER TUMOR);  Surgeon: Alvaro Ricardo KATHEE Mickey., MD;  Location: [facility];  Service: Urology;  Laterality: N/A;    SOCIAL HISTORY: Social History   Socioeconomic History   Marital status: Married    Spouse name: Not on file   Number of children: Not on file   Years of education: Not on file   Highest education level: Not on file  Occupational History   Not on file  Tobacco Use   Smoking status: Former    Current packs/day: 0.25    Average packs/day: 0.3 packs/day for 30.0 years (7.5 ttl pk-yrs)    Types: Cigarettes    Passive exposure: Past   Smokeless tobacco: Never  Vaping Use   Vaping status: Never Used  Substance and Sexual Activity   Alcohol  use: Never   Drug use: Never   Sexual  activity: Not on file  Other Topics Concern   Not on file  Social History Narrative   Not on file   Social Drivers of Health   Tobacco Use: Medium Risk (11/09/2024)   Patient History    Smoking Tobacco Use: Former    Smokeless Tobacco Use: Never    Passive Exposure: Past  Physicist, Medical Strain: Not on file  Food Insecurity: No Food Insecurity (09/25/2023)   Hunger Vital Sign    Worried About Running Out of Food in the Last Year: Never true    Ran Out of Food in the Last Year: Never true  Transportation Needs: No Transportation Needs (09/25/2023)   PRAPARE - Administrator, Civil Service (Medical): No    Lack of Transportation (Non-Medical): No  Physical Activity: Not on file  Stress: Not on file  Social Connections: Not on file  Intimate Partner Violence: Not At Risk (09/25/2023)   Humiliation, Afraid, Rape, and Kick questionnaire    Fear of Current or Ex-Partner: No    Emotionally Abused: No    Physically Abused: No    Sexually Abused: No  Depression (PHQ2-9): Medium Risk (11/09/2024)   Depression (PHQ2-9)    PHQ-2 Score: 9  Alcohol  Screen: Not on file  Housing: Low Risk (09/25/2023)   Housing    Last Housing Risk Score: 0  Utilities: Not At Risk (09/25/2023)   [facility] Utilities    Threatened with loss of utilities: No  Health Literacy: Not on file    FAMILY HISTORY: Family History  Adopted: Yes    ALLERGIES:  is allergic to pseudoephedrine hcl.  MEDICATIONS:  Current Outpatient Medications  Medication Sig Dispense Refill   dicyclomine  (BENTYL ) 10 MG capsule Take 1 capsule (10 mg total) by mouth 4 (four) times daily -  before meals and at bedtime. 45 capsule 0   escitalopram  (LEXAPRO ) 5 MG tablet Take 1 tablet (5 mg total) by mouth at bedtime. 90 tablet 3   estradiol  (ESTRACE ) 1 MG tablet TAKE 1 TABLET BY MOUTH DAILY 90 tablet 3   famotidine  (PEPCID ) 20 MG tablet Take 20 mg by mouth at bedtime.     folic acid  (FOLVITE ) 1 MG tablet Take 2 tablets (2 mg  total) by mouth daily. 60 tablet 3   levothyroxine  (SYNTHROID ) 50 MCG tablet Take 1 tablet (50 mcg total) by mouth daily before breakfast. Do not eat or drink until 30 mins- 1 hour after taking the pill. 30 tablet 3   oxyCODONE -acetaminophen  (PERCOCET/ROXICET) 5-325 MG tablet Take 1 tablet by mouth every 8 (eight) hours as needed for severe pain (pain score 7-10). 45 tablet 0   prochlorperazine  (COMPAZINE ) 10 MG tablet TAKE 1 TABLET BY MOUTH EVERY 6 HOURS AS NEEDED FOR NAUSEA  OR VOMITING. 90 tablet 1   tamsulosin  (FLOMAX ) 0.4 MG CAPS capsule Take 1 capsule (0.4 mg total) by mouth daily after supper. 30 capsule 0   topiramate  (TOPAMAX ) 50 MG tablet TAKE 2 TABLETS BY MOUTH AT  BEDTIME 180 tablet 3   traMADol  (ULTRAM ) 50 MG tablet Take 1-2 tablets (50-100 mg total) by mouth every 6 (six) hours as needed for moderate pain (pain score 4-6) or severe pain (pain score 7-10) (from bladder cancer). 30 tablet 0   carboxymethylcellulose (REFRESH PLUS) 0.5 % SOLN Place 1 drop into both eyes 3 (three) times daily as needed (dry eyes).     No current facility-administered medications for this visit.   Facility-Administered Medications Ordered in Other Visits  Medication Dose Route Frequency Provider Last Rate Last Admin   0.9 %  sodium chloride  infusion   Intravenous Continuous Season Astacio R, MD 999 mL/hr at 11/09/24 0919 New Bag at 11/09/24 0919   0.9 %  sodium chloride  infusion   Intravenous Continuous Thelia Tanksley R, MD       enfortumab vedotin -ejfv (PADCEV ) 50 mg in sodium chloride  0.9 % 50 mL (0.9091 mg/mL) chemo infusion  1 mg/kg (Treatment Plan Recorded) Intravenous Once Anyely Cunning R, MD       heparin  lock flush 100 unit/mL  500 Units Intravenous Once Agrawal, Kavita, MD       heparin  lock flush 100 unit/mL  500 Units Intravenous Once Agrawal, Kavita, MD       heparin  lock flush 100 unit/mL  500 Units Intravenous Once Agrawal, Kavita, MD       pembrolizumab  (KEYTRUDA ) 200 mg in  sodium chloride  0.9 % 50 mL chemo infusion  200 mg Intravenous Once Rebekah Sprinkle R, MD       sodium chloride  flush (NS) 0.9 % injection 10 mL  10 mL Intravenous Once Agrawal, Kavita, MD       sodium chloride  flush (NS) 0.9 % injection 10 mL  10 mL Intravenous Once Agrawal, Kavita, MD       sodium chloride  flush (NS) 0.9 % injection 10 mL  10 mL Intravenous Once Agrawal, Kavita, MD        PHYSICAL EXAMINATION:   Vitals:   11/09/24 0805  BP: 136/76  Pulse: 88  Resp: 20  Temp: (!) 97.2 F (36.2 C)  SpO2: 99%    Filed Weights   11/09/24 0805  Weight: 95 lb 7.4 oz (43.3 kg)     Physical Exam Vitals and nursing note reviewed.  HENT:     Head: Normocephalic and atraumatic.     Mouth/Throat:     Pharynx: Oropharynx is clear.  Eyes:     Extraocular Movements: Extraocular movements intact.     Pupils: Pupils are equal, round, and reactive to light.  Cardiovascular:     Rate and Rhythm: Normal rate and regular rhythm.  Pulmonary:     Comments: Decreased breath sounds bilaterally.  Abdominal:     Palpations: Abdomen is soft.  Musculoskeletal:        General: Normal range of motion.     Cervical back: Normal range of motion.  Skin:    General: Skin is warm.  Neurological:     General: No focal deficit present.     Mental Status: She is alert and oriented to person, place, and time.  Psychiatric:        Behavior: Behavior normal.        Judgment: Judgment normal.     LABORATORY DATA:  I have reviewed the data as listed Lab Results  Component Value Date   WBC 6.2 11/09/2024   HGB 11.3 (L) 11/09/2024   HCT 33.7 (L) 11/09/2024   MCV 96.0 11/09/2024   PLT 242 11/09/2024   Recent Labs    10/19/24 1334 10/26/24 1318 11/09/24 0801  NA 139 135 137  K 3.2* 3.1* 3.1*  CL 105 102 102  CO2 22 22 23   GLUCOSE 146* 171* 148*  BUN 15 14 20   CREATININE 1.51* 1.50* 1.61*  CALCIUM  8.9 8.9 9.2  GFRNONAA 38* 39* 36*  PROT 6.8 7.1 7.5  ALBUMIN 3.8 3.7 3.9  AST 21 24  24   ALT <5 <5 <5  ALKPHOS 61 66 66  BILITOT 0.3 0.4 0.4    RADIOGRAPHIC STUDIES: I have personally reviewed the radiological images as listed and agreed with the findings in the report. MM 3D SCREENING MAMMOGRAM BILATERAL BREAST Result Date: 11/04/2024 CLINICAL DATA:  Screening. EXAM: DIGITAL SCREENING BILATERAL MAMMOGRAM WITH TOMOSYNTHESIS AND CAD TECHNIQUE: Bilateral screening digital craniocaudal and mediolateral oblique mammograms were obtained. Bilateral screening digital breast tomosynthesis was performed. The images were evaluated with computer-aided detection. COMPARISON:  Previous exam(s). ACR Breast Density Category c: The breasts are heterogeneously dense, which may obscure small masses. FINDINGS: There are no findings suspicious for malignancy. IMPRESSION: No mammographic evidence of malignancy. A result letter of this screening mammogram will be mailed directly to the patient. RECOMMENDATION: Screening mammogram in one year. (Code:SM-B-01Y) BI-RADS CATEGORY  1: Negative. Electronically Signed   By: Dina  Arceo M.D.   On: 11/04/2024 13:13       Malignant neoplasm of urinary bladder (HCC) # Stage/metastatic urothelial cancer; [facility]us testing was sent on peritoneal biopsies - no targets currently on Pembro+ Erfortumab-; # PET NOV 21- 2025-.  Mild progression of pelvic nodal metastasis;  No extrapelvic metastatic disease identified.   #  Currently on proceed with Pem+Enfor 1mg /kg- day1 today. Labs-CBC/chemistries were reviewed with the patient. Will order scan at next visit-   # Bladder spasms/ # Mild lower abdominal pain- sec to malignancy/ ? Stent- - recommend percocet/bentyl  prn- add flomax . .   # Anemia- multifactorial- low folic acid - called in a script- monitor for now.  stable.  # Renal/Hypokalemia- -Potassium 3.2 .   supp- potassium rich food.  Continue monitoring.  CKD stage III - Increase fluid intake- stable.  # Elevated TSH but normal- T4- discussed with patient- will  monitor for now.  Hypothyroidism sec to jerome- [AUG 2025]-Continue with Synthroid  50 mcg/day-  stable.   # Weight loss: -Continue to follow with nutrition. Declines Zyprexa -  stable.   # Maculopapular rash: Likely secondary to enfortumab vedotin - Initially, started with grade 3 involving more than 30% BSA with severe pruritus, mild fever.  Significantly improved after dose reduction. Continue with supportive care-Claritin, Aveeno moisturizer and Singulair ; Kenalog  0.5% twice daily as needed to the affected areas- stable.   # Hematuria: [Dr. 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  Continue follow-up with Dr. Alvaro- again in 6-9 months.   stable.   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   PS- # DISPOSITION: # morphine  2 mg IVP today;  IVFs  500 cc over 30 mins- michelle- please order # chemo today-  #  as per IS- in 1 week-chemo/labs- # as per IS     - MD; labs-cbc/cmp; Thyroid  profile-;chemo; day -8-labs- chemo Dr.B   Above plan of care was discussed with patient/family in detail.  My contact information was given to the patient/family.      Cindy JONELLE Joe, MD 11/09/2024 9:44 AM

## 2024-11-10 LAB — THYROID PANEL WITH TSH
Free Thyroxine Index: 2.8 (ref 1.2–4.9)
T3 Uptake Ratio: 26 % (ref 24–39)
T4, Total: 10.6 ug/dL (ref 4.5–12.0)
TSH: 4.07 u[IU]/mL (ref 0.450–4.500)

## 2024-11-16 ENCOUNTER — Inpatient Hospital Stay

## 2024-11-18 ENCOUNTER — Inpatient Hospital Stay

## 2024-11-18 NOTE — Progress Notes (Signed)
 Nutrition   Patient scheduled for follow-up telephone nutrition visit today but was unable to reach patient. Left message with call back number.    Message sent to scheduling to offer another follow-up visit.    Flora Ratz B. Dasie SOLON, CSO, LDN Registered Dietitian [phone]

## 2024-11-20 ENCOUNTER — Other Ambulatory Visit: Payer: Self-pay | Admitting: Internal Medicine

## 2024-11-20 ENCOUNTER — Emergency Department

## 2024-11-20 ENCOUNTER — Inpatient Hospital Stay

## 2024-11-20 ENCOUNTER — Emergency Department
Admission: EM | Admit: 2024-11-20 | Discharge: 2024-11-20 | Disposition: A | Discharge status: Home / Self Care | Attending: Emergency Medicine | Admitting: Emergency Medicine

## 2024-11-20 DIAGNOSIS — R1030 Lower abdominal pain, unspecified: Secondary | ICD-10-CM | POA: Diagnosis present

## 2024-11-20 DIAGNOSIS — N3001 Acute cystitis with hematuria: Secondary | ICD-10-CM | POA: Diagnosis not present

## 2024-11-20 DIAGNOSIS — N1330 Unspecified hydronephrosis: Secondary | ICD-10-CM | POA: Insufficient documentation

## 2024-11-20 DIAGNOSIS — Z8551 Personal history of malignant neoplasm of bladder: Secondary | ICD-10-CM | POA: Diagnosis not present

## 2024-11-20 LAB — COMPREHENSIVE METABOLIC PANEL WITH GFR
ALT: <5 U/L (ref 0–44)
AST: 25 U/L (ref 15–41)
Albumin: 3.8 g/dL (ref 3.5–5.0)
Alkaline Phosphatase: 67 U/L (ref 38–126)
Anion gap: 13 (ref 5–15)
BUN: 23 mg/dL (ref 8–23)
CO2: 23 mmol/L (ref 22–32)
Calcium: 9.1 mg/dL (ref 8.9–10.3)
Chloride: 98 mmol/L (ref 98–111)
Creatinine, Ser: 1.97 mg/dL — ABNORMAL HIGH (ref 0.44–1.00)
GFR, Estimated: 28 mL/min — ABNORMAL LOW
Glucose, Bld: 95 mg/dL (ref 70–99)
Potassium: 3.4 mmol/L — ABNORMAL LOW (ref 3.5–5.1)
Sodium: 134 mmol/L — ABNORMAL LOW (ref 135–145)
Total Bilirubin: 0.5 mg/dL (ref 0.0–1.2)
Total Protein: 7.7 g/dL (ref 6.5–8.1)

## 2024-11-20 LAB — URINALYSIS, ROUTINE W REFLEX MICROSCOPIC
RBC / HPF: >50 RBC/hpf (ref 0–5)
WBC, UA: >50 WBC/hpf (ref 0–5)

## 2024-11-20 LAB — CBC
HCT: 35.8 % — ABNORMAL LOW (ref 36.0–46.0)
Hemoglobin: 12.1 g/dL (ref 12.0–15.0)
MCH: 31.9 pg (ref 26.0–34.0)
MCHC: 33.8 g/dL (ref 30.0–36.0)
MCV: 94.5 fL (ref 80.0–100.0)
Platelets: 266 10*3/uL (ref 150–400)
RBC: 3.79 MIL/uL — ABNORMAL LOW (ref 3.87–5.11)
RDW: 13.7 % (ref 11.5–15.5)
WBC: 5.8 10*3/uL (ref 4.0–10.5)
nRBC: 0 % (ref 0.0–0.2)

## 2024-11-20 LAB — LIPASE, BLOOD: Lipase: 31 U/L (ref 11–51)

## 2024-11-20 MED ORDER — IOHEXOL 300 MG/ML  SOLN
60.0000 mL | Freq: Once | INTRAMUSCULAR | Status: AC | PRN
  Administered 2024-11-20: 60 mL via INTRAVENOUS

## 2024-11-20 MED ORDER — CEPHALEXIN 500 MG PO CAPS: 500.0000 mg | ORAL_CAPSULE | Freq: Three times a day (TID) | ORAL | 0 refills | Status: AC

## 2024-11-20 MED ORDER — HYDROMORPHONE HCL 1 MG/ML IJ SOLN
1.0000 mg | Freq: Once | INTRAMUSCULAR | Status: AC
  Administered 2024-11-20: 1 mg via INTRAVENOUS
  Filled 2024-11-20: qty 1

## 2024-11-20 MED ORDER — SODIUM CHLORIDE 0.9 % IV SOLN
1.0000 g | Freq: Once | INTRAVENOUS | Status: AC
  Administered 2024-11-20: 1 g via INTRAVENOUS
  Filled 2024-11-20: qty 10

## 2024-11-20 MED ORDER — ONDANSETRON HCL 4 MG/2ML IJ SOLN
4.0000 mg | Freq: Once | INTRAMUSCULAR | Status: AC
  Administered 2024-11-20: 4 mg via INTRAVENOUS
  Filled 2024-11-20: qty 2

## 2024-11-20 MED ORDER — SODIUM CHLORIDE 0.9 % IV SOLN: Freq: Once | INTRAVENOUS | Status: AC

## 2024-11-20 NOTE — ED Notes (Signed)
 Patient ambulated without assistance to restroom, with steady, even gait.

## 2024-11-20 NOTE — ED Notes (Signed)
 Patient provided with ice chips, per Arlander, MD.

## 2024-11-20 NOTE — ED Notes (Signed)
 Patient coming [facility] from home for N/V X 2 days. Patient taking compazine  w/little relief. Constipation X 4 days. Reports hx stage IV bladder Ca. Abdominal cramps X 2 days. 164/76, HR89, O2 100% RA, 97.9. AO X 4.

## 2024-11-20 NOTE — ED Provider Notes (Signed)
 "  [facility] Provider Note    Event Date/Time   First MD Initiated Contact with Patient 11/20/24 1819     (approximate)   History   Abdominal Pain   HPI  Barbara [name] is a 64 y.o. female with a history of metastatic bladder cancer who presents with complaints of worsening lower abdominal pain, nausea and vomiting over the last 2 to 3 days.  Medical records reviewed, last saw Dr. Rennie of oncology on January 19     Physical Exam   Triage Vital Signs: ED Triage Vitals  Encounter Vitals Group     BP 11/20/24 1743 (!) 141/86     Girls Systolic BP Percentile --      Girls Diastolic BP Percentile --      Boys Systolic BP Percentile --      Boys Diastolic BP Percentile --      Pulse Rate 11/20/24 1743 86     Resp 11/20/24 1743 18     Temp 11/20/24 1743 98.3 F (36.8 C)     Temp src --      SpO2 11/20/24 1743 100 %     Weight 11/20/24 1744 43.1 kg (95 lb)     Height 11/20/24 1744 1.676 m (5' 6)     Head Circumference --      Peak Flow --      Pain Score 11/20/24 1743 5     Pain Loc --      Pain Education --      Exclude from Growth Chart --     Most recent vital signs: Vitals:   11/20/24 1743 11/20/24 2216  BP: (!) 141/86 (!) 160/75  Pulse: 86 80  Resp: 18 16  Temp: 98.3 F (36.8 C) 98 F (36.7 C)  SpO2: 100% 100%     General: Awake,  CV:  Good peripheral perfusion.  Resp:  Normal effort.  Abd:  No distention.  Mild tenderness in the lower abdomen Other:     ED Results / Procedures / Treatments   Labs (all labs ordered are listed, but only abnormal results are displayed) Labs Reviewed  COMPREHENSIVE METABOLIC PANEL WITH GFR - Abnormal; Notable for the following components:      Result Value   Sodium 134 (*)    Potassium 3.4 (*)    Creatinine, Ser 1.97 (*)    GFR, Estimated 28 (*)    All other components within normal limits  CBC - Abnormal; Notable for the following components:   RBC 3.79 (*)    HCT 35.8 (*)     All other components within normal limits  URINALYSIS, ROUTINE W REFLEX MICROSCOPIC - Abnormal; Notable for the following components:   Color, Urine RED (*)    APPearance CLOUDY (*)    Glucose, UA   (*)    Value: TEST NOT REPORTED DUE TO COLOR INTERFERENCE OF URINE PIGMENT   Hgb urine dipstick   (*)    Value: TEST NOT REPORTED DUE TO COLOR INTERFERENCE OF URINE PIGMENT   Bilirubin Urine   (*)    Value: TEST NOT REPORTED DUE TO COLOR INTERFERENCE OF URINE PIGMENT   Ketones, ur   (*)    Value: TEST NOT REPORTED DUE TO COLOR INTERFERENCE OF URINE PIGMENT   Protein, ur   (*)    Value: TEST NOT REPORTED DUE TO COLOR INTERFERENCE OF URINE PIGMENT   Nitrite   (*)    Value: TEST NOT REPORTED DUE TO  COLOR INTERFERENCE OF URINE PIGMENT   Leukocytes,Ua   (*)    Value: TEST NOT REPORTED DUE TO COLOR INTERFERENCE OF URINE PIGMENT   Bacteria, UA MANY (*)    All other components within normal limits  URINE CULTURE  LIPASE, BLOOD     EKG     RADIOLOGY CT scans shows right ureter obstruction by tumor causing hydroureteronephrosis   PROCEDURES:  Critical Care performed:   Procedures   MEDICATIONS ORDERED IN ED: Medications  cefTRIAXone  (ROCEPHIN ) 1 g in sodium chloride  0.9 % 100 mL IVPB (1 g Intravenous New Bag/Given 11/20/24 2219)  HYDROmorphone  (DILAUDID ) injection 1 mg (1 mg Intravenous Given 11/20/24 1902)  0.9 %  sodium chloride  infusion (0 mLs Intravenous Stopped 11/20/24 2024)  ondansetron  (ZOFRAN ) injection 4 mg (4 mg Intravenous Given 11/20/24 1858)  iohexol  (OMNIPAQUE ) 300 MG/ML solution 60 mL (60 mLs Intravenous Contrast Given 11/20/24 1919)     IMPRESSION / MDM / ASSESSMENT AND PLAN / ED COURSE  I reviewed the triage vital signs and the nursing notes. Patient's presentation is most consistent with severe exacerbation of chronic illness.  Patient presents with worsening abdominal pain in the setting of metastatic bladder cancer.  Will treat with IV Dilaudid , IV Zofran , IV  fluids as she appears dehydrated  Given worsening pain over the last several days will obtain imaging after discussion with she and her family  CT demonstrates hydroureteronephrosis, she also has likely urinary tract infection  Gradual elevation of creatinine noted  Discussed this with Dr. Melanee of oncology and Dr. Roseann of urology  Dr. Roseann does not believe emergent procedure is indicated  I discussed with patient at length, she would strongly prefer antibiotics and discharge and follow-up with her urologist Dr. Alvaro and Dr. Rennie early next week  Given this we will give a dose of IV Rocephin  and discharge.  She reports her pain is completely resolved, she and her family agree with this plan      FINAL CLINICAL IMPRESSION(S) / ED DIAGNOSES   Final diagnoses:  Acute cystitis with hematuria  Hydroureteronephrosis     Rx / DC Orders   ED Discharge Orders          Ordered    cephALEXin  (KEFLEX ) 500 MG capsule  3 times daily        11/20/24 2151             Note:  This document was prepared using Dragon voice recognition software and may include unintentional dictation errors.   Arlander Charleston, MD 11/20/24 2238  "

## 2024-11-20 NOTE — ED Triage Notes (Signed)
 Patient c/o abdominal pain, N/V X 3 days. Patient is receiving immunotherapy and chemo for stave IV bladder cancer.

## 2024-11-23 LAB — URINE CULTURE: Culture: >=100000 — AB

## 2024-11-25 ENCOUNTER — Inpatient Hospital Stay: Attending: Internal Medicine

## 2024-11-25 ENCOUNTER — Inpatient Hospital Stay

## 2024-11-25 ENCOUNTER — Inpatient Hospital Stay: Attending: Internal Medicine | Admitting: Nurse Practitioner

## 2024-11-25 VITALS — BP 157/78 | HR 7 | Temp 96.1°F | Resp 18 | Ht 66.0 in | Wt 94.0 lb

## 2024-11-25 DIAGNOSIS — C679 Malignant neoplasm of bladder, unspecified: Secondary | ICD-10-CM

## 2024-11-25 LAB — CBC WITH DIFFERENTIAL (CANCER CENTER ONLY)
Abs Immature Granulocytes: 0.03 10*3/uL (ref 0.00–0.07)
Basophils Absolute: 0 10*3/uL (ref 0.0–0.1)
Basophils Relative: 1 %
Eosinophils Absolute: 0.3 10*3/uL (ref 0.0–0.5)
Eosinophils Relative: 5 %
HCT: 32.8 % — ABNORMAL LOW (ref 36.0–46.0)
Hemoglobin: 11.1 g/dL — ABNORMAL LOW (ref 12.0–15.0)
Immature Granulocytes: 1 %
Lymphocytes Relative: 30 %
Lymphs Abs: 1.8 10*3/uL (ref 0.7–4.0)
MCH: 32.5 pg (ref 26.0–34.0)
MCHC: 33.8 g/dL (ref 30.0–36.0)
MCV: 95.9 fL (ref 80.0–100.0)
Monocytes Absolute: 0.5 10*3/uL (ref 0.1–1.0)
Monocytes Relative: 9 %
Neutro Abs: 3.3 10*3/uL (ref 1.7–7.7)
Neutrophils Relative %: 54 %
Platelet Count: 242 10*3/uL (ref 150–400)
RBC: 3.42 MIL/uL — ABNORMAL LOW (ref 3.87–5.11)
RDW: 14 % (ref 11.5–15.5)
WBC Count: 6 10*3/uL (ref 4.0–10.5)
nRBC: 0 % (ref 0.0–0.2)

## 2024-11-25 LAB — CMP (CANCER CENTER ONLY)
ALT: <5 U/L (ref 0–44)
AST: 29 U/L (ref 15–41)
Albumin: 3.8 g/dL (ref 3.5–5.0)
Alkaline Phosphatase: 64 U/L (ref 38–126)
Anion gap: 13 (ref 5–15)
BUN: 20 mg/dL (ref 8–23)
CO2: 23 mmol/L (ref 22–32)
Calcium: 9.2 mg/dL (ref 8.9–10.3)
Chloride: 103 mmol/L (ref 98–111)
Creatinine: 2.51 mg/dL — ABNORMAL HIGH (ref 0.44–1.00)
GFR, Estimated: 21 mL/min — ABNORMAL LOW
Glucose, Bld: 115 mg/dL — ABNORMAL HIGH (ref 70–99)
Potassium: 3.3 mmol/L — ABNORMAL LOW (ref 3.5–5.1)
Sodium: 138 mmol/L (ref 135–145)
Total Bilirubin: 0.3 mg/dL (ref 0.0–1.2)
Total Protein: 7.2 g/dL (ref 6.5–8.1)

## 2024-11-25 MED ORDER — SODIUM CHLORIDE 0.9 % IV SOLN
Freq: Once | INTRAVENOUS | Status: AC
  Filled 2024-11-25: qty 250

## 2024-11-27 ENCOUNTER — Inpatient Hospital Stay

## 2024-11-27 ENCOUNTER — Encounter: Payer: Self-pay | Admitting: Nurse Practitioner

## 2024-11-27 ENCOUNTER — Inpatient Hospital Stay: Admitting: Nurse Practitioner

## 2024-11-27 VITALS — BP 167/88 | HR 93 | Temp 97.7°F | Resp 16 | Wt 94.0 lb

## 2024-11-27 DIAGNOSIS — C679 Malignant neoplasm of bladder, unspecified: Secondary | ICD-10-CM

## 2024-11-27 DIAGNOSIS — E876 Hypokalemia: Secondary | ICD-10-CM

## 2024-11-27 DIAGNOSIS — Z95828 Presence of other vascular implants and grafts: Secondary | ICD-10-CM

## 2024-11-27 LAB — CMP (CANCER CENTER ONLY)
ALT: <5 U/L (ref 0–44)
AST: 27 U/L (ref 15–41)
Albumin: 3.7 g/dL (ref 3.5–5.0)
Alkaline Phosphatase: 60 U/L (ref 38–126)
Anion gap: 17 — ABNORMAL HIGH (ref 5–15)
BUN: 21 mg/dL (ref 8–23)
CO2: 18 mmol/L — ABNORMAL LOW (ref 22–32)
Calcium: 8.8 mg/dL — ABNORMAL LOW (ref 8.9–10.3)
Chloride: 100 mmol/L (ref 98–111)
Creatinine: 2.47 mg/dL — ABNORMAL HIGH (ref 0.44–1.00)
GFR, Estimated: 21 mL/min — ABNORMAL LOW
Glucose, Bld: 103 mg/dL — ABNORMAL HIGH (ref 70–99)
Potassium: 3.3 mmol/L — ABNORMAL LOW (ref 3.5–5.1)
Sodium: 135 mmol/L (ref 135–145)
Total Bilirubin: 0.4 mg/dL (ref 0.0–1.2)
Total Protein: 7 g/dL (ref 6.5–8.1)

## 2024-11-27 LAB — CBC WITH DIFFERENTIAL (CANCER CENTER ONLY)
Abs Immature Granulocytes: 0.02 10*3/uL (ref 0.00–0.07)
Basophils Absolute: 0 10*3/uL (ref 0.0–0.1)
Basophils Relative: 1 %
Eosinophils Absolute: 0.2 10*3/uL (ref 0.0–0.5)
Eosinophils Relative: 2 %
HCT: 32.1 % — ABNORMAL LOW (ref 36.0–46.0)
Hemoglobin: 10.9 g/dL — ABNORMAL LOW (ref 12.0–15.0)
Immature Granulocytes: 0 %
Lymphocytes Relative: 27 %
Lymphs Abs: 1.8 10*3/uL (ref 0.7–4.0)
MCH: 32.2 pg (ref 26.0–34.0)
MCHC: 34 g/dL (ref 30.0–36.0)
MCV: 95 fL (ref 80.0–100.0)
Monocytes Absolute: 0.6 10*3/uL (ref 0.1–1.0)
Monocytes Relative: 9 %
Neutro Abs: 4 10*3/uL (ref 1.7–7.7)
Neutrophils Relative %: 61 %
Platelet Count: 245 10*3/uL (ref 150–400)
RBC: 3.38 MIL/uL — ABNORMAL LOW (ref 3.87–5.11)
RDW: 13.8 % (ref 11.5–15.5)
WBC Count: 6.5 10*3/uL (ref 4.0–10.5)
nRBC: 0 % (ref 0.0–0.2)

## 2024-11-27 MED ORDER — SODIUM CHLORIDE 0.9 % IV SOLN
Freq: Once | INTRAVENOUS | Status: AC
  Filled 2024-11-27: qty 250

## 2024-11-27 MED ORDER — ONDANSETRON HCL 4 MG/2ML IJ SOLN
4.0000 mg | Freq: Once | INTRAMUSCULAR | Status: AC
  Administered 2024-11-27: 4 mg via INTRAVENOUS
  Filled 2024-11-27: qty 2

## 2024-11-30 ENCOUNTER — Inpatient Hospital Stay

## 2024-11-30 ENCOUNTER — Inpatient Hospital Stay: Admitting: Nurse Practitioner

## 2024-11-30 ENCOUNTER — Inpatient Hospital Stay: Admitting: Internal Medicine

## 2024-12-07 ENCOUNTER — Inpatient Hospital Stay

## 2024-12-09 ENCOUNTER — Inpatient Hospital Stay

## 2024-12-09 ENCOUNTER — Inpatient Hospital Stay: Admitting: Internal Medicine

## 2024-12-16 ENCOUNTER — Inpatient Hospital Stay

## 2025-04-09 ENCOUNTER — Ambulatory Visit: Payer: Self-pay | Admitting: Physician Assistant

## 2025-10-11 ENCOUNTER — Encounter: Payer: Self-pay | Admitting: Physician Assistant
